# Patient Record
Sex: Male | Born: 1970 | Race: Black or African American | Hispanic: No | Marital: Married | Smoking: Former smoker
Health system: Southern US, Community
[De-identification: ages and names within clinical notes are randomized; demographics above are authoritative.]

## PROBLEM LIST (undated history)

## (undated) DIAGNOSIS — I10 Essential (primary) hypertension: Secondary | ICD-10-CM

## (undated) DIAGNOSIS — R300 Dysuria: Secondary | ICD-10-CM

## (undated) DIAGNOSIS — E7800 Pure hypercholesterolemia, unspecified: Secondary | ICD-10-CM

## (undated) DIAGNOSIS — I4510 Unspecified right bundle-branch block: Secondary | ICD-10-CM

## (undated) DIAGNOSIS — K219 Gastro-esophageal reflux disease without esophagitis: Secondary | ICD-10-CM

## (undated) DIAGNOSIS — Z87442 Personal history of urinary calculi: Secondary | ICD-10-CM

## (undated) DIAGNOSIS — M545 Low back pain, unspecified: Secondary | ICD-10-CM

## (undated) DIAGNOSIS — G478 Other sleep disorders: Secondary | ICD-10-CM

## (undated) DIAGNOSIS — F329 Major depressive disorder, single episode, unspecified: Secondary | ICD-10-CM

## (undated) DIAGNOSIS — Z5189 Encounter for other specified aftercare: Secondary | ICD-10-CM

## (undated) DIAGNOSIS — F3289 Other specified depressive episodes: Secondary | ICD-10-CM

## (undated) DIAGNOSIS — E785 Hyperlipidemia, unspecified: Secondary | ICD-10-CM

## (undated) HISTORY — DX: Gastro-esophageal reflux disease without esophagitis: K21.9

## (undated) HISTORY — DX: Low back pain, unspecified: M54.50

## (undated) HISTORY — DX: Encounter for other specified aftercare: Z51.89

## (undated) HISTORY — DX: Personal history of urinary calculi: Z87.442

## (undated) HISTORY — DX: Hyperlipidemia, unspecified: E78.5

## (undated) HISTORY — DX: Other specified depressive episodes: F32.89

## (undated) HISTORY — PX: HAND SURGERY: SHX662

## (undated) HISTORY — DX: Major depressive disorder, single episode, unspecified: F32.9

## (undated) HISTORY — PX: OTHER SURGICAL HISTORY: SHX169

## (undated) HISTORY — DX: Unspecified right bundle-branch block: I45.10

## (undated) HISTORY — DX: Other sleep disorders: G47.8

## (undated) HISTORY — DX: Low back pain: M54.5

## (undated) HISTORY — DX: Dysuria: R30.0

## (undated) SURGICAL SUPPLY — 1 items: INQWIRE 1.5J .035X260CM (WIRE) ×1 IMPLANT

---

## 2004-02-23 ENCOUNTER — Emergency Department (HOSPITAL_COMMUNITY): Admission: EM | Admit: 2004-02-23 | Discharge: 2004-02-23 | Payer: Self-pay

## 2004-04-26 ENCOUNTER — Ambulatory Visit: Payer: Self-pay | Admitting: Family Medicine

## 2004-04-30 ENCOUNTER — Emergency Department (HOSPITAL_COMMUNITY): Admission: EM | Admit: 2004-04-30 | Discharge: 2004-04-30 | Payer: Self-pay | Admitting: Emergency Medicine

## 2004-05-02 ENCOUNTER — Ambulatory Visit: Payer: Self-pay | Admitting: Family Medicine

## 2004-05-03 ENCOUNTER — Ambulatory Visit: Payer: Self-pay | Admitting: *Deleted

## 2004-05-24 ENCOUNTER — Ambulatory Visit: Payer: Self-pay | Admitting: Family Medicine

## 2004-05-31 ENCOUNTER — Ambulatory Visit: Payer: Self-pay | Admitting: Family Medicine

## 2005-01-24 ENCOUNTER — Ambulatory Visit: Payer: Self-pay | Admitting: Family Medicine

## 2005-01-27 ENCOUNTER — Ambulatory Visit (HOSPITAL_COMMUNITY): Admission: RE | Admit: 2005-01-27 | Discharge: 2005-01-27 | Payer: Self-pay | Admitting: Family Medicine

## 2005-02-08 ENCOUNTER — Ambulatory Visit: Payer: Self-pay | Admitting: Family Medicine

## 2006-07-14 ENCOUNTER — Ambulatory Visit: Payer: Self-pay | Admitting: Cardiology

## 2006-07-14 ENCOUNTER — Emergency Department (HOSPITAL_COMMUNITY): Admission: EM | Admit: 2006-07-14 | Discharge: 2006-07-15 | Payer: Self-pay | Admitting: Emergency Medicine

## 2007-05-16 ENCOUNTER — Ambulatory Visit (HOSPITAL_BASED_OUTPATIENT_CLINIC_OR_DEPARTMENT_OTHER): Admission: RE | Admit: 2007-05-16 | Discharge: 2007-05-16 | Payer: Self-pay | Admitting: Urology

## 2007-05-23 ENCOUNTER — Ambulatory Visit (HOSPITAL_COMMUNITY): Admission: RE | Admit: 2007-05-23 | Discharge: 2007-05-23 | Payer: Self-pay | Admitting: Urology

## 2007-06-27 ENCOUNTER — Ambulatory Visit (HOSPITAL_COMMUNITY): Admission: RE | Admit: 2007-06-27 | Discharge: 2007-06-27 | Payer: Self-pay | Admitting: Urology

## 2009-04-04 ENCOUNTER — Inpatient Hospital Stay (HOSPITAL_COMMUNITY): Admission: EM | Admit: 2009-04-04 | Discharge: 2009-04-06 | Payer: Self-pay | Admitting: Emergency Medicine

## 2009-05-13 DIAGNOSIS — K219 Gastro-esophageal reflux disease without esophagitis: Secondary | ICD-10-CM | POA: Insufficient documentation

## 2009-05-13 DIAGNOSIS — E785 Hyperlipidemia, unspecified: Secondary | ICD-10-CM | POA: Insufficient documentation

## 2009-05-14 ENCOUNTER — Ambulatory Visit: Payer: Self-pay | Admitting: Pulmonary Disease

## 2009-05-14 DIAGNOSIS — G478 Other sleep disorders: Secondary | ICD-10-CM | POA: Insufficient documentation

## 2009-05-14 DIAGNOSIS — I10 Essential (primary) hypertension: Secondary | ICD-10-CM | POA: Insufficient documentation

## 2009-05-27 ENCOUNTER — Ambulatory Visit: Payer: Self-pay | Admitting: Pulmonary Disease

## 2009-11-12 ENCOUNTER — Telehealth: Payer: Self-pay | Admitting: Pulmonary Disease

## 2009-12-10 ENCOUNTER — Telehealth: Payer: Self-pay | Admitting: Physician Assistant

## 2009-12-10 ENCOUNTER — Ambulatory Visit: Payer: Self-pay | Admitting: Physician Assistant

## 2009-12-10 DIAGNOSIS — Z87442 Personal history of urinary calculi: Secondary | ICD-10-CM | POA: Insufficient documentation

## 2009-12-10 DIAGNOSIS — M6530 Trigger finger, unspecified finger: Secondary | ICD-10-CM | POA: Insufficient documentation

## 2009-12-10 DIAGNOSIS — IMO0002 Reserved for concepts with insufficient information to code with codable children: Secondary | ICD-10-CM | POA: Insufficient documentation

## 2009-12-10 DIAGNOSIS — R6889 Other general symptoms and signs: Secondary | ICD-10-CM | POA: Insufficient documentation

## 2009-12-13 LAB — CONVERTED CEMR LAB
ALT: 17 units/L (ref 0–53)
Alkaline Phosphatase: 51 units/L (ref 39–117)
Basophils Relative: 1 % (ref 0–1)
CO2: 22 meq/L (ref 19–32)
Calcium: 9.9 mg/dL (ref 8.4–10.5)
Creatinine, Ser: 0.87 mg/dL (ref 0.40–1.50)
Eosinophils Absolute: 0.2 10*3/uL (ref 0.0–0.7)
Glucose, Bld: 90 mg/dL (ref 70–99)
Hemoglobin: 14.2 g/dL (ref 13.0–17.0)
Lymphocytes Relative: 52 % — ABNORMAL HIGH (ref 12–46)
MCV: 84 fL (ref 78.0–100.0)
Monocytes Absolute: 0.6 10*3/uL (ref 0.1–1.0)
Monocytes Relative: 10 % (ref 3–12)
Neutrophils Relative %: 34 % — ABNORMAL LOW (ref 43–77)
RBC: 5.26 M/uL (ref 4.22–5.81)
RDW: 15.1 % (ref 11.5–15.5)
Sodium: 139 meq/L (ref 135–145)
WBC: 5.3 10*3/uL (ref 4.0–10.5)

## 2009-12-20 ENCOUNTER — Ambulatory Visit: Payer: Self-pay | Admitting: Physician Assistant

## 2009-12-21 LAB — CONVERTED CEMR LAB
Cholesterol, target level: 200 mg/dL
HDL: 42 mg/dL (ref >39)
LDL Cholesterol: 151 mg/dL — ABNORMAL HIGH (ref 0–99)
Total CHOL/HDL Ratio: 5.3
Triglycerides: 152 mg/dL — ABNORMAL HIGH (ref <150)

## 2010-03-09 ENCOUNTER — Telehealth: Payer: Self-pay | Admitting: Physician Assistant

## 2010-03-09 ENCOUNTER — Ambulatory Visit: Payer: Self-pay | Admitting: Physician Assistant

## 2010-03-09 DIAGNOSIS — M545 Low back pain, unspecified: Secondary | ICD-10-CM | POA: Insufficient documentation

## 2010-03-09 DIAGNOSIS — R300 Dysuria: Secondary | ICD-10-CM | POA: Insufficient documentation

## 2010-03-10 ENCOUNTER — Encounter: Payer: Self-pay | Admitting: Physician Assistant

## 2010-03-10 ENCOUNTER — Telehealth: Payer: Self-pay | Admitting: Physician Assistant

## 2010-03-11 DIAGNOSIS — I4510 Unspecified right bundle-branch block: Secondary | ICD-10-CM | POA: Insufficient documentation

## 2010-03-14 ENCOUNTER — Encounter (INDEPENDENT_AMBULATORY_CARE_PROVIDER_SITE_OTHER): Payer: Self-pay | Admitting: *Deleted

## 2010-03-14 ENCOUNTER — Encounter: Payer: Self-pay | Admitting: Physician Assistant

## 2010-07-03 LAB — CONVERTED CEMR LAB
ALT: 14 units/L (ref 0–53)
AST: 13 units/L (ref 0–37)
Alkaline Phosphatase: 54 units/L (ref 39–117)
Bilirubin Urine: NEGATIVE
Blood in Urine, dipstick: NEGATIVE
Glucose, Urine, Semiquant: NEGATIVE
HDL: 33 mg/dL — ABNORMAL LOW (ref >39)
Nitrite: NEGATIVE
Total CHOL/HDL Ratio: 4.1
Total Protein: 7.7 g/dL (ref 6.0–8.3)
VLDL: 20 mg/dL (ref 0–40)
WBC Urine, dipstick: NEGATIVE

## 2010-07-06 NOTE — Letter (Signed)
Summary: *HSN Results Follow up  [facility]  [address] [place], [place] [address]   Phone: [phone]  Fax: [phone]      03/14/2010   Donald [name] [address] [place], [place]  [address]   Dear  Mr. [name],                            ____S.Drinkard,FNP   ____D. Gore,FNP       ____B. McPherson,MD   ____V. Rankins,MD    ____E. Mulberry,MD    ____N. Daphine Deutscher, FNP  ____D. Reche Dixon, MD    ____K. Philipp Deputy, MD    ____Other     This letter is to inform you that your recent test(s):  _______Pap Smear    ___X____Lab Test     _______X-ray    ____X___ is within acceptable limits  _______ requires a medication change  _______ requires a follow-up lab visit  _______ requires a follow-up visit with your provider   Comments:  Please avoid caffeine and drink more water.  Avoid spicy foods also.       _________________________________________________________ If you have any questions, please contact our office                     Sincerely,  [name] [facility]

## 2010-07-06 NOTE — Letter (Signed)
Summary: *HSN Results Follow up  [facility]  [address] [place], [place] [address]   Phone: [phone]  Fax: [phone]      03/14/2010   Kayon Nevils [address] [place], [place]  [address]   Dear  Mr. LORRAINE TERRIQUEZ,                            ____S.Drinkard,FNP   ____D. Gore,FNP       ____B. McPherson,MD   ____V. Rankins,MD    ____E. Mulberry,MD    ____N. Daphine Deutscher, FNP  ____D. Reche Dixon, MD    ____K. Philipp Deputy, MD    ____Other     This letter is to inform you that your recent test(s):  _______Pap Smear    ___X____Lab Test     _______X-ray    ___X____ is within acceptable limits  _______ requires a medication change  _______ requires a follow-up lab visit  _______ requires a follow-up visit with your provider   Comments: Your cholestrol has improved.       _________________________________________________________ If you have any questions, please contact our office                     Sincerely,  [name] [facility]

## 2010-07-06 NOTE — Progress Notes (Signed)
Summary: nos appt  Phone Note Call from Patient   Caller: [email or web address]  Call For: Donald [name] Summary of Call: LMTCB x2 to rsc nos from 6/9. Initial call taken by: Darletta Moll,  November 12, 2009 2:45 PM

## 2010-07-06 NOTE — Progress Notes (Signed)
Summary: PT CHANGE PHARMACY [facility]  Phone Note Call from Patient Call back at [phone]   Reason for Call: Refill Medication, Talk to Nurse Summary of Call: PT NEED TO CHANGE PHARMACY  DONT SEND IT TO [facility]. HE WANTS  [facility] PLEASE SENT THE RX FOR LIPITOR. Initial call taken by: Domenic Polite,  March 10, 2010 9:10 AM  Follow-up for Phone Call        Rx already at [facility]. Pt. advised to call to request refill.  Dutch Quint RN  March 10, 2010 9:24 AM

## 2010-07-06 NOTE — Progress Notes (Signed)
  Phone Note Refill Request Message from:  Patient on March 09, 2010 12:29 PM  Refills Requested: Medication #1:  LISINOPRIL-HYDROCHLOROTHIAZIDE 20-25 MG TABS Take 1 tablet by mouth once a day  Medication #2:  LISINOPRIL 10 MG TABS Take 1 tablet by mouth once a day  Medication #3:  LIPITOR 40 MG TABS Take 1 tab by mouth at bedtime [facility] [address]   Method Requested: Fax to Local Pharmacy Initial call taken by: [name],  March 09, 2010 12:29 PM    Prescriptions: LISINOPRIL 10 MG TABS (LISINOPRIL) Take 1 tablet by mouth once a day  #30 x 11   Entered and Authorized by:   Tereso Newcomer PA-C   Signed by:   Tereso Newcomer PA-C on 03/10/2010   Method used:   Electronically to        [facility] [address] (retail)       [phone]       [place], [place]  [address]       Ph: [phone]       Fax: [phone]   RxID:   [id number] LIPITOR 40 MG TABS (ATORVASTATIN CALCIUM) Take 1 tab by mouth at bedtime  #30 x 11   Entered and Authorized by:   Tereso Newcomer PA-C   Signed by:   Tereso Newcomer PA-C on 03/10/2010   Method used:   Electronically to        [facility] [address] (retail)       [phone]       [place], [place]  [address]       Ph: [phone]       Fax: [phone]   RxID:   [id number] LISINOPRIL-HYDROCHLOROTHIAZIDE 20-25 MG TABS (LISINOPRIL-HYDROCHLOROTHIAZIDE) Take 1 tablet by mouth once a day  #30 x 11   Entered and Authorized by:   Tereso Newcomer PA-C   Signed by:   Tereso Newcomer PA-C on 03/10/2010   Method used:   Electronically to        [facility] [address] (retail)       [phone]       [place], [place]  [address]       Ph: [phone]       Fax: [phone]   RxID:   [id number]

## 2010-07-06 NOTE — Assessment & Plan Note (Signed)
Summary: CPE   Vital Signs:  Patient profile:   40 year old male Height:      74 inches Weight:      235.9 pounds BMI:     30.40 Temp:     98.5 degrees F oral Pulse rate:   76 / minute Pulse rhythm:   regular Resp:     20 per minute BP sitting:   110 / 80  (left arm) Cuff size:   large  Vitals Entered By: [name] (March 09, 2010 8:49 AM) CC: cpe, Hypertension Management Is Patient Diabetic? No Pain Assessment Patient in pain? no       Does patient need assistance? Functional Status Self care Ambulation Normal   Primary Care Provider:  Tereso Newcomer PA-C  CC:  cpe and Hypertension Management.  History of Present Illness: Here for CPE.  PHQ9=4.  Answers yes to "Are you feeling down, depressed or hopeless?"  Never dx with depression or took medicines.  No suicidal thoughts.  Open to seeing counselor.  Cannot find a job. Refuses flu shot. Needs Td shot. Needs lipids checked.    Hand injury:  Never heard about referral back to Dr. Merlyn Lot.  Right hand improving slightly.  Notes continued weakness.  Has wasting at thenar and webspace areas.  Trigger finger on left improving.  Never came in for injection.  Hypertension History:      He denies chest pain and dyspnea with exertion.        Positive major cardiovascular risk factors include hyperlipidemia and hypertension.  Negative major cardiovascular risk factors include male age less than 45 years old and non-tobacco-user status.     Problems Prior to Update: 1)  Dysuria  (ICD-788.1) 2)  Lumbago  (ICD-724.2) 3)  Depression, Mild  (ICD-311) 4)  Preventive Health Care  (ICD-V70.0) 5)  Other Symptoms Involving Head and Neck  (ICD-784.99) 6)  Late Effect of Tendon Injury  (ICD-905.8) 7)  Trigger Finger, Left Thumb  (ICD-727.03) 8)  Nephrolithiasis, Hx of  (ICD-V13.01) 9)  Parasomnia  (ICD-780.59) 10)  Hypertension  (ICD-401.9) 11)  Hyperlipidemia  (ICD-272.4) 12)  G E R D  (ICD-530.81)  Current Medications  (verified): 1)  Aspirin 325 Mg Tabs (Aspirin) .... Once Daily 2)  Lisinopril-Hydrochlorothiazide 20-25 Mg Tabs (Lisinopril-Hydrochlorothiazide) .... Take 1 Tablet By Mouth Once A Day 3)  Klonopin 0.5 Mg Tabs (Clonazepam) .... One Each Night At Bedtime 4)  Lipitor 40 Mg Tabs (Atorvastatin Calcium) .... Take 1 Tab By Mouth At Bedtime 5)  Lisinopril 10 Mg Tabs (Lisinopril) .... Take 1 Tablet By Mouth Once A Day 6)  Zyrtec Hives Relief 10 Mg Tabs (Cetirizine Hcl) .... Take 1 Tablet By Mouth Once A Day As Needed For Scratchy Throat  Allergies (verified): 1)  ! Sulfa  Past History:  Past Medical History: Last updated: 12/10/2009 Current Problems:  HYPERTENSION (ICD-401.9) HYPERLIPIDEMIA (ICD-272.4) G E R D (ICD-530.81) Nephrolithiasis, hx of  Past Surgical History: Last updated: 12/10/2009 recent surgery to repair tendon injuries in right and left hand 03/2009   a.  patient was sleep walking and hand went through glass   b.  had extensive surgery to repair tendons and vessels   c.  presented with hemorrhagic shock h/o ureteral stent due to kidney stones      Family History: Reviewed history from 12/10/2009 and no changes required. non contributory Family History Hypertension- mom No colon or prostate cancer  Social History: Reviewed history from 12/10/2009 and no changes required. Married  no kids Patient states former smoker.  quit in 2003.  1 ppd x3 yrs Alcohol use-no Drug use-no  Review of Systems      See HPI General:  Denies chills and fever. Eyes:  Complains of eye irritation. CV:  Denies chest pain or discomfort and shortness of breath with exertion. Resp:  Denies cough. GI:  Denies bloody stools, constipation, and dark tarry stools. GU:  Complains of dysuria; denies hematuria, nocturia, urinary frequency, and urinary hesitancy. MS:  Complains of low back pain. Psych:  See HPI; Denies suicidal thoughts/plans.  Physical Exam  General:  alert, well-developed,  and well-nourished.   Head:  normocephalic and atraumatic.   Eyes:  pupils equal, pupils round, pupils reactive to light, and no retinal abnormalitiies.   Ears:  R ear normal and L ear normal.   Nose:  no external deformity.   Mouth:  pharynx pink and moist, no erythema, and no exudates.   Neck:  supple, no thyromegaly, no JVD, no carotid bruits, and no cervical lymphadenopathy.   Chest Wall:  no deformities.   Breasts:  no gynecomastia.   Lungs:  normal breath sounds, no crackles, and no wheezes.   Heart:  normal rate, regular rhythm, and no murmur.   Abdomen:  soft, non-tender, normal bowel sounds, and no hepatomegaly.   Rectal:  no external abnormalities, no hemorrhoids, normal sphincter tone, no masses, no tenderness, no fissures, and no fistulae.   Genitalia:  circumcised, no hydrocele, no varicocele, no scrotal masses, no testicular masses or atrophy, no cutaneous lesions, and no urethral discharge.   Prostate:  no gland enlargement, no nodules, no asymmetry, and no induration.   Extremities:  no edema  Neurologic:  alert & oriented X3 and cranial nerves II-XII intact.   BLE strength is normal and equal bilat  Skin:  turgor normal.   Psych:  normally interactive.     Detailed Back/Spine Exam  Lumbosacral Exam:  Inspection-deformity:       some lumbar parapsinal muscle tenderness noted; soft tissue swelling noted Palpation-spinal tenderness:  Normal Sitting Straight Leg Raise:    Right:  negative    Left:  negative   Impression & Recommendations:  Problem # 1:  DEPRESSION, MILD (ICD-311)  PHQ9=4 never on meds feels down sometimes discussed counseling and he is interested  His updated medication list for this problem includes:    Klonopin 0.5 Mg Tabs (Clonazepam) ..... One each night at bedtime  Orders: Psychology Referral (Psychology)  Problem # 2:  PREVENTIVE HEALTH CARE (ICD-V70.0)  Orders: T-Lipid Profile ([address]109-60454) UA Dipstick w/o Micro (manual)  (81002) T-HIV Antibody  (Reflex) (09811-91478)  Problem # 3:  HYPERTENSION (ICD-401.9) controlled renal fxn stable in July  His updated medication list for this problem includes:    Lisinopril-hydrochlorothiazide 20-25 Mg Tabs (Lisinopril-hydrochlorothiazide) .Marland Kitchen... Take 1 tablet by mouth once a day    Lisinopril 10 Mg Tabs (Lisinopril) .Marland Kitchen... Take 1 tablet by mouth once a day  Problem # 4:  HYPERLIPIDEMIA (ICD-272.4)  check lipids LFTs stable in July  His updated medication list for this problem includes:    Lipitor 40 Mg Tabs (Atorvastatin calcium) .Marland Kitchen... Take 1 tab by mouth at bedtime  Orders: T-Lipid Profile (29562-13086)  Problem # 5:  LATE EFFECT OF TENDON INJURY (ICD-905.8) never heard about appt with Dr. Merlyn Lot right hand function is improving will work on referral if no luck consider [facility] or just send to OT  Problem # 6:  TRIGGER FINGER, LEFT THUMB (ICD-727.03) never  made appt with Dr. Delrae Alfred for left thumb feels better he will call if he decides to do injection  Problem # 7:  LUMBAGO (ICD-724.2) ? muscle strain but, does have some improvement with leaning forward and has some pain in ant thigh (? L2-3 range) get xray nsaids and muscle relaxers rest f/u in 4-6 weeks . . . if no improvement or getting worse consider PT +/- MRI  His updated medication list for this problem includes:    Aspirin 325 Mg Tabs (Aspirin) ..... Once daily    Naprosyn 500 Mg Tabs (Naproxen) .Marland Kitchen... Take 1 tablet by mouth two times a day as needed for pain    Robaxin 500 Mg Tabs (Methocarbamol) .Marland Kitchen... 1-2 by mouth every 8 hours as needed for muscle spasm or pain  Problem # 8:  DYSURIA (ICD-788.1) drinks a lot of caffeine and spicy foods may be irritating bladder cut back on these cx urine no penile discharge . . . monogamous relationship return if no improvement  Orders: T-Culture, Urine ([id number])  Complete Medication List: 1)  Aspirin 325 Mg Tabs (Aspirin) .... Once daily 2)   Lisinopril-hydrochlorothiazide 20-25 Mg Tabs (Lisinopril-hydrochlorothiazide) .... Take 1 tablet by mouth once a day 3)  Klonopin 0.5 Mg Tabs (Clonazepam) .... One each night at bedtime 4)  Lipitor 40 Mg Tabs (Atorvastatin calcium) .... Take 1 tab by mouth at bedtime 5)  Lisinopril 10 Mg Tabs (Lisinopril) .... Take 1 tablet by mouth once a day 6)  Zyrtec Hives Relief 10 Mg Tabs (Cetirizine hcl) .... Take 1 tablet by mouth once a day as needed for scratchy throat 7)  Naprosyn 500 Mg Tabs (Naproxen) .... Take 1 tablet by mouth two times a day as needed for pain 8)  Robaxin 500 Mg Tabs (Methocarbamol) .Marland Kitchen.. 1-2 by mouth every 8 hours as needed for muscle spasm or pain  Other Orders: Flu Vaccine 45yrs + (86578) Admin 1st Vaccine (46962) Admin 1st Vaccine [facility]) [id number]) TD Toxoids IM 7 YR + (32440) Admin of Any Addtl Vaccine (10272) Admin of Any Addtl Vaccine (State) (53664Q)  Hypertension Assessment/Plan:      The patient's hypertensive risk group is category B: At least one risk factor (excluding diabetes) with no target organ damage.  His calculated 10 year risk of coronary heart disease is 6 %.  Today's blood pressure is 110/80.  His blood pressure goal is < 140/90.  Patient Instructions: 1)  Schedule appt with Ethelene Browns. 2)  Take Naprosyn with food two times a day every day for 3-5 days.  Then, take two times a day as needed. 3)  Take the Robaxin for 1-2 days every day, then take as needed (as directed).  The robaxin may make you drowsy. 4)  Rest:  No heavy lifting, bending, stooping, running or jumping for 1-2 weeks.  But, stay active.  Go for easy walks and stretch your legs. 5)  You can put heat on your low back for 15 minutes two times a day to three times a day as needed. 6)  Cut back on caffeine and spicy foods as much as possible and drink plenty of water.  Follow up if the burning does not resolve or gets worse. 7)  Schedule follow up in 4 weeks for back pain.  Return sooner if  it is getting worse. Prescriptions: ROBAXIN 500 MG TABS (METHOCARBAMOL) 1-2 by mouth every 8 hours as needed for muscle spasm or pain  #30 x 1   Entered and Authorized by:  Tereso Newcomer PA-C   Signed by:   Tereso Newcomer PA-C on 03/09/2010   Method used:   Print then Give to Patient   RxID:   [id number] NAPROSYN 500 MG TABS (NAPROXEN) Take 1 tablet by mouth two times a day as needed for pain  #30 x 2   Entered and Authorized by:   Tereso Newcomer PA-C   Signed by:   Tereso Newcomer PA-C on 03/09/2010   Method used:   Print then Give to Patient   RxID:   [id number]   Laboratory Results   Urine Tests  Date/Time Received: March 09, 2010 9:08 AM   Routine Urinalysis   Glucose: negative   (Normal Range: Negative) Bilirubin: negative   (Normal Range: Negative) Ketone: negative   (Normal Range: Negative) Spec. Gravity: >=1.030   (Normal Range: 1.003-1.035) Blood: negative   (Normal Range: Negative) pH: 6.0   (Normal Range: 5.0-8.0) Protein: negative   (Normal Range: Negative) Urobilinogen: 0.2   (Normal Range: 0-1) Nitrite: negative   (Normal Range: Negative) Leukocyte Esterace: negative   (Normal Range: Negative)    Date/Time Received: March 09, 2010 12:29 PM   Other Tests  Rapid HIV: negative     Tetanus/Td Vaccine    Vaccine Type: Td    Site: left deltoid    Mfr: Sanofi Pasteur    Dose: 0.5 ml    Route: IM    Given by: [name]    Exp. Date: 06/15/2012    Lot #: [id number]    VIS given: 04/22/08 version given March 09, 2010.  Influenza Vaccine    Vaccine Type: Fluvax 3+    Site: right deltoid    Mfr: GlaxoSmithKline    Dose: 0.5 ml    Route: IM    Given by: [name]    Exp. Date: /2012    Lot #: [id number]    VIS given: 12/28/09 version given March 09, 2010.  Flu Vaccine Consent Questions    Do you have a history of severe allergic reactions to this vaccine? no    Any prior history of allergic reactions to egg and/or gelatin? no     Do you have a sensitivity to the preservative Thimersol? no    Do you have a past history of Guillan-Barre Syndrome? no    Do you currently have an acute febrile illness? no    Have you ever had a severe reaction to latex? no    Vaccine information given and explained to patient? yes  Appended Document: CPE    Clinical Lists Changes  Problems: Added new problem of RIGHT BUNDLE BRANCH BLOCK (ICD-426.4) Observations: Added new observation of EKGIMPRESS: consistent with previous tracing in 10.2011 (03/09/2010 15:38) Added new observation of EKG INTERP: Normal sinus rhythm with rate of:  64 Right bundle branch block.   (03/09/2010 15:38)       EKG  Procedure date:  03/09/2010  Findings:      Normal sinus rhythm with rate of:  64 Right bundle branch block.    Comments:      consistent with previous tracing in 10.2011

## 2010-07-06 NOTE — Progress Notes (Signed)
Summary: Office Visit//DEPRESSION SCREENING  Office Visit//DEPRESSION SCREENING   Imported By: Arta Bruce 03/14/2010 11:12:33  _____________________________________________________________________  External Attachment:    Type:   Image     Comment:   External Document

## 2010-07-06 NOTE — Assessment & Plan Note (Signed)
Summary: Barbaraann Barthel PT/HTN///KT   Vital Signs:  Patient profile:   40 year old male Weight:      255 pounds Temp:     97.8 degrees F oral Pulse rate:   74 / minute Pulse rhythm:   regular Resp:     20 per minute BP sitting:   126 / 81  (left arm)  Vitals Entered By: [name] (December 10, 2009 12:13 PM) CC: follow-up visit, hypetenison and high cholestorol, pt.wnats a referral for ortho. (right hand). Is Patient Diabetic? No Pain Assessment Patient in pain? no       Does patient need assistance? Functional Status Self care Ambulation Normal   Primary Care Provider:  Tereso Newcomer PA-C  CC:  follow-up visit, hypetenison and high cholestorol, and pt.wnats a referral for ortho. (right hand)..  History of Present Illness: Re-estab.  Patient thinks he used to see Dr. Barbaraann Barthel.  Going to [facility] for last few years.   Saw Dr. Chilton Si.  Has HTN and high chol.  Was sleep walking in Oct and had significant lac. to both hands.  Presented with hemorrhagic shock.  Had extensive surgery on both hands.  Was seeing Dr. Merlyn Lot who was closely following his right hand.  He had a trigger finger injection done on the thumb recently.  He does have a trigger finger on the left thumb at the DIPJ.  He notes pain with this.  He is slowly gaining function with his right hand.  He last saw Dr. Merlyn Lot in May.  Sounds like he may have been doing periodic EMGs to assess his rehab.  He was told he may need further surgery on his right hand.  Habits & Providers  Exercise-Depression-Behavior     Drug Use: no  Current Medications (verified): 1)  Aspirin 325 Mg Tabs (Aspirin) .... Once Daily 2)  Lisinopril-Hydrochlorothiazide 10-12.5 Mg Tabs (Lisinopril-Hydrochlorothiazide) .... Once Daily 3)  Klonopin 0.5 Mg Tabs (Clonazepam) .... One Each Night At Bedtime  Allergies (verified): 1)  ! Sulfa  Past History:  Past Medical History: Current Problems:  HYPERTENSION (ICD-401.9) HYPERLIPIDEMIA  (ICD-272.4) G E R D (ICD-530.81) Nephrolithiasis, hx of  Past Surgical History: recent surgery to repair tendon injuries in right and left hand 03/2009   a.  patient was sleep walking and hand went through glass   b.  had extensive surgery to repair tendons and vessels   c.  presented with hemorrhagic shock h/o ureteral stent due to kidney stones      Family History: non contributory Family History Hypertension- mom No colon or prostate cancer  Social History: Married no kids Patient states former smoker.  quit in 2003.  1 ppd x3 yrs Alcohol use-no Drug use-no Drug Use:  no  Review of Systems      See HPI General:  Denies chills and fever. ENT:  constantly clearing throat; no sore throat; no congestion or sneezing. CV:  Denies chest pain or discomfort and shortness of breath with exertion. Resp:  Denies cough.  Physical Exam  General:  alert, well-developed, and well-nourished.   Head:  normocephalic and atraumatic.   Mouth:  pharynx pink and moist.   tonsils 2+ bilat no exudate Neck:  supple.   Lungs:  normal breath sounds.   Heart:  normal rate and regular rhythm.   Abdomen:  soft, non-tender, and no hepatomegaly.   Msk:  right and left arm with extensive scarring  left thumb with trigger lock at DIPJ; pully palpated at  MCPJ Extremities:  no edema  Neurologic:  alert & oriented X3 and cranial nerves II-XII intact.   Psych:  normally interactive.     Impression & Recommendations:  Problem # 1:  HYPERTENSION (ICD-401.9)  controlled  His updated medication list for this problem includes:    Lisinopril-hydrochlorothiazide 20-25 Mg Tabs (Lisinopril-hydrochlorothiazide) .Marland Kitchen... Take 1 tablet by mouth once a day    Lisinopril 10 Mg Tabs (Lisinopril) .Marland Kitchen... Take 1 tablet by mouth once a day  Orders: T-Comprehensive Metabolic Panel [phone]) T-CBC w/Diff ([id number]) T-TSH ([id number])  Problem # 2:  HYPERLIPIDEMIA (ICD-272.4)  check labs  His  updated medication list for this problem includes:    Pravastatin Sodium 40 Mg Tabs (Pravastatin sodium) .Marland Kitchen... 1 tablet by mouth once a day  Orders: T-Comprehensive Metabolic Panel [phone]) T-Lipid Profile ([id number])  Problem # 3:  LATE EFFECT OF TENDON INJURY (ICD-905.8) will try to get back with Dr. Merlyn Lot may need to send to [facility]  Problem # 4:  OTHER SYMPTOMS INVOLVING HEAD AND NECK (ICD-784.99)  ? allergies trial of zyrtec to see if this helps  Orders: Orthopedic Surgeon Referral (Ortho Surgeon)  Problem # 5:  TRIGGER FINGER, LEFT THUMB (ICD-727.03) Dr. Delrae Alfred saw patient will set up time for her to inject his left thumb  Complete Medication List: 1)  Aspirin 325 Mg Tabs (Aspirin) .... Once daily 2)  Lisinopril-hydrochlorothiazide 20-25 Mg Tabs (Lisinopril-hydrochlorothiazide) .... Take 1 tablet by mouth once a day 3)  Klonopin 0.5 Mg Tabs (Clonazepam) .... One each night at bedtime 4)  Pravastatin Sodium 40 Mg Tabs (Pravastatin sodium) .Marland Kitchen.. 1 tablet by mouth once a day 5)  Lisinopril 10 Mg Tabs (Lisinopril) .... Take 1 tablet by mouth once a day 6)  Zyrtec Hives Relief 10 Mg Tabs (Cetirizine hcl) .... Take 1 tablet by mouth once a day as needed for scratchy throat  Patient Instructions: 1)  Schedulel 45 minute appointment with Dr. Delrae Alfred for trigger finger injection. 2)  Please schedule a follow-up appointment in 2-3 months with Kino Dunsworth for CPE. 3)  Someone will call you about seeing Dr. Merlyn Lot back.  Prescriptions: ZYRTEC HIVES RELIEF 10 MG TABS (CETIRIZINE HCL) Take 1 tablet by mouth once a day as needed for scratchy throat  #30 x 2   Entered and Authorized by:   Tereso Newcomer PA-C   Signed by:   Tereso Newcomer PA-C on 12/10/2009   Method used:   Print then Give to Patient   RxID:   [id number] PRAVASTATIN SODIUM 40 MG TABS (PRAVASTATIN SODIUM) 1 tablet by mouth once a day  #30 x 5   Entered and Authorized by:   Tereso Newcomer PA-C   Signed by:   Tereso Newcomer  PA-C on 12/10/2009   Method used:   Print then Give to Patient   RxID:   [id number] LISINOPRIL 10 MG TABS (LISINOPRIL) Take 1 tablet by mouth once a day  #30 x 5   Entered and Authorized by:   Tereso Newcomer PA-C   Signed by:   Tereso Newcomer PA-C on 12/10/2009   Method used:   Print then Give to Patient   RxID:   [id number] LISINOPRIL-HYDROCHLOROTHIAZIDE 20-25 MG TABS (LISINOPRIL-HYDROCHLOROTHIAZIDE) Take 1 tablet by mouth once a day  #30 x 5   Entered and Authorized by:   Tereso Newcomer PA-C   Signed by:   Tereso Newcomer PA-C on 12/10/2009   Method used:   Print then Give to Patient   RxID:   [id number]

## 2010-07-06 NOTE — Progress Notes (Signed)
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  Patient had surgery with Dr. Merlyn Lot in Oct 2010.  He has been seeing him on a regular basis.  He lost his insurance recently.  He may need future surgery.  Please see if we can get him back to Dr.Kuzma.  If patient cannot afford this, please try to get into [facility]. Initial call taken by: Tereso Newcomer PA-C,  December 10, 2009 2:35 PM  Follow-up for Phone Call        once surgery has gone into play [facility] has not been taking our practice.They have been sending them back to original provider. Follow-up by: Candi Leash,  December 13, 2009 3:34 PM  Additional Follow-up for Phone Call Additional follow up Details #1::        Ok.  Let's get him back to Dr. Merlyn Lot. Additional Follow-up by: Tereso Newcomer PA-C,  December 13, 2009 5:47 PM

## 2010-09-07 LAB — CBC
HCT: 24 % — ABNORMAL LOW (ref 39.0–52.0)
HCT: 24 % — ABNORMAL LOW (ref 39.0–52.0)
MCHC: 35 g/dL (ref 30.0–36.0)
MCV: 86.1 fL (ref 78.0–100.0)
MCV: 87.2 fL (ref 78.0–100.0)
Platelets: 189 10*3/uL (ref 150–400)
RBC: 2.78 MIL/uL — ABNORMAL LOW (ref 4.22–5.81)
RDW: 14.3 % (ref 11.5–15.5)
WBC: 8.8 10*3/uL (ref 4.0–10.5)

## 2010-09-07 LAB — DIFFERENTIAL
Eosinophils Relative: 1 % (ref 0–5)
Lymphs Abs: 2.3 10*3/uL (ref 0.7–4.0)
Monocytes Absolute: 0.9 10*3/uL (ref 0.1–1.0)
Neutrophils Relative %: 59 % (ref 43–77)

## 2010-09-07 LAB — BASIC METABOLIC PANEL
CO2: 30 mEq/L (ref 19–32)
Calcium: 7.3 mg/dL — ABNORMAL LOW (ref 8.4–10.5)
GFR calc Af Amer: >60 mL/min (ref >60)
Glucose, Bld: 117 mg/dL — ABNORMAL HIGH (ref 70–99)
Sodium: 136 mEq/L (ref 135–145)

## 2010-09-08 LAB — POCT I-STAT 4, (NA,K, GLUC, HGB,HCT)
Glucose, Bld: 140 mg/dL — ABNORMAL HIGH (ref 70–99)
Potassium: 4.2 mEq/L (ref 3.5–5.1)

## 2010-09-08 LAB — POCT I-STAT, CHEM 8
BUN: 17 mg/dL (ref 6–23)
Calcium, Ion: 1.12 mmol/L (ref 1.12–1.32)
Chloride: 103 mEq/L (ref 96–112)
Creatinine, Ser: 0.9 mg/dL (ref 0.4–1.5)
Glucose, Bld: 187 mg/dL — ABNORMAL HIGH (ref 70–99)
TCO2: 22 mmol/L (ref 0–100)

## 2010-09-08 LAB — BASIC METABOLIC PANEL
Calcium: 8 mg/dL — ABNORMAL LOW (ref 8.4–10.5)
Chloride: 107 mEq/L (ref 96–112)
Glucose, Bld: 202 mg/dL — ABNORMAL HIGH (ref 70–99)
Sodium: 139 mEq/L (ref 135–145)

## 2010-09-08 LAB — CBC
HCT: 26.5 % — ABNORMAL LOW (ref 39.0–52.0)
HCT: 26.9 % — ABNORMAL LOW (ref 39.0–52.0)
HCT: 36.2 % — ABNORMAL LOW (ref 39.0–52.0)
HCT: 36.6 % — ABNORMAL LOW (ref 39.0–52.0)
Hemoglobin: 12.6 g/dL — ABNORMAL LOW (ref 13.0–17.0)
Hemoglobin: 12.6 g/dL — ABNORMAL LOW (ref 13.0–17.0)
MCHC: 34.5 g/dL (ref 30.0–36.0)
MCV: 86.4 fL (ref 78.0–100.0)
MCV: 87.1 fL (ref 78.0–100.0)
MCV: 87.4 fL (ref 78.0–100.0)
Platelets: 194 10*3/uL (ref 150–400)
Platelets: 209 10*3/uL (ref 150–400)
RBC: 3.04 MIL/uL — ABNORMAL LOW (ref 4.22–5.81)
RDW: 14.4 % (ref 11.5–15.5)
WBC: 11.2 10*3/uL — ABNORMAL HIGH (ref 4.0–10.5)
WBC: 13.5 10*3/uL — ABNORMAL HIGH (ref 4.0–10.5)
WBC: 8.3 10*3/uL (ref 4.0–10.5)

## 2010-09-08 LAB — URINALYSIS, ROUTINE W REFLEX MICROSCOPIC
Nitrite: NEGATIVE
Specific Gravity, Urine: 1.022 (ref 1.005–1.030)
Urobilinogen, UA: 0.2 mg/dL (ref 0.0–1.0)
pH: 6.5 (ref 5.0–8.0)

## 2010-09-08 LAB — TYPE AND SCREEN
ABO/RH(D): A POS
Antibody Screen: NEGATIVE

## 2010-09-08 LAB — HEPATIC FUNCTION PANEL
ALT: 16 U/L
Albumin: 3.3 g/dL
Total Bilirubin: 0.5 mg/dL

## 2010-09-08 LAB — DIFFERENTIAL
Basophils Absolute: 0.1 10*3/uL (ref 0.0–0.1)
Basophils Relative: 1 % (ref 0–1)
Eosinophils Relative: 3 % (ref 0–5)
Lymphs Abs: 5.1 10*3/uL — ABNORMAL HIGH (ref 0.7–4.0)
Monocytes Absolute: 0.5 10*3/uL (ref 0.1–1.0)
Monocytes Relative: 7 % (ref 3–12)
Neutro Abs: 2.4 10*3/uL (ref 1.7–7.7)

## 2010-09-08 LAB — PROTIME-INR
INR: 1.15 (ref 0.00–1.49)
Prothrombin Time: 14.6 seconds (ref 11.6–15.2)

## 2010-09-08 LAB — APTT: aPTT: 23 seconds — ABNORMAL LOW (ref 24–37)

## 2010-09-08 LAB — MRSA CULTURE

## 2010-10-18 NOTE — Op Note (Signed)
NAMEZIQUAN, FIDEL           ACCOUNT NO.:  [id number]   MEDICAL RECORD NO.:  [id number]          PATIENT TYPE:  AMB   LOCATION:  [facility]                         FACILITY:  [facility]   PHYSICIAN:  Bertram Millard. Dahlstedt, M.D.DATE OF BIRTH:  1970-09-07   DATE OF PROCEDURE:  05/16/2007  DATE OF DISCHARGE:                               OPERATIVE REPORT   PREOPERATIVE DIAGNOSIS:  Left renal calculi, large, with hematuria.   POSTOPERATIVE DIAGNOSIS:  Left renal calculi, large, with hematuria.   PRINCIPAL PROCEDURES:  1. Cystoscopy.  2. Left retrograde ureteropyelogram.  3. Left double-J stent placement.   SURGEON:  Bertram Millard. Dahlstedt, M.D.   ANESTHESIA:  General LMA.   COMPLICATIONS:  None.   BRIEF HISTORY:  A 40 year old male who presented earlier this week with  intermittent left flank pain and gross hematuria.  He had had a prior  history of kidney stones.  Evaluation included a KUB, which showed large  left to right renal calculi.  One of these was more medially placed than  the other.  I felt that more than likely this was causing his hematuria  and his left flank pain.  He has a prior history of kidney stones and  lithotripsy.   I recommended treatment of these stones as they are quite large and  symptomatic.  Treatment options include percutaneous nephrolithotomy and  lithotripsy.  The stones are large and if lithotripsy were to be  performed, I recommend a stent placement.  He has chosen to have  lithotripsy over percutaneous nephrolithotomy.  I let him know that more  than likely stones like this would need more than one treatment.  He is  aware of this as well as risks and complications of the procedure.  He  desires to proceed.   DESCRIPTION OF PROCEDURE:  The patient was administered preoperative IV  antibiotics and taken to the operating room after his surgical side was  marked.  He was placed in the dorsal lithotomy position after general  anesthetic was  administered using the LMA.  Genitalia and perineum were  prepped and draped.  A 22-French panendoscope was passed through his  urethra, which was normal.  Prostate nonobstructive.  Bladder entered  and inspected circumferentially.  No tumors, trabeculations or foreign  bodies.  Ureteral orifices normal configuration and location.  Left  ureter was cannulated with an open-end catheter.  Retrograde performed.  It showed normal ureter.  There was no significant hydronephrosis.  Filling defects consistent with stones were seen in the lower pole area  in the pelvis of his kidney.  Following adequate retrograde, a guidewire  was placed through the end-hole catheter.  I then removed the end-hole  catheter and then a 26 cm x 6-French contour stent was placed.  Good  curls were seen  proximally and distally when this guidewire was removed.  The bladder  was drained and the procedure terminated after the scope was removed.   The patient tolerated the procedure well.  He was awakened, taken to  PACU in stable condition.      Bertram Millard. Dahlstedt, M.D.  Electronically Signed  SMD/MEDQ  D:  05/16/2007  T:  05/17/2007  Job:  [id number]

## 2010-10-21 NOTE — Discharge Summary (Signed)
[name], Donald [name]           ACCOUNT NO.:  [id number]   MEDICAL RECORD NO.:  [id number]          PATIENT TYPE:  EMS   LOCATION:  [facility]                         FACILITY:  [facility]   PHYSICIAN:  Learta Codding, MD,FACC DATE OF BIRTH:  Jul 23, 1970   DATE OF ADMISSION:  07/14/2006  DATE OF DISCHARGE:  07/15/2006                               DISCHARGE SUMMARY   PROCEDURES:  CT angiogram of the chest and abdomen.   PRIMARY DIAGNOSIS:  Chest pain.   SECONDARY DIAGNOSES:  1. Hypertension.  2. Hyperlipidemia with a total cholesterol of 222, triglycerides 112,      HDL 31, LDL 169.   TIME AT DISCHARGE:  34 minutes.   HOSPITAL COURSE:  [name] is a 40 year old male with no previous  history of coronary artery disease.  He came to the emergency room for  chest pain that was both at rest and with exertion.  He was admitted for  further evaluation and treatment.   Cardiac enzymes were negative for MI.  A D-dimer was within normal  limits.  TSH was within normal limits as well.  There was concern for  structural cause of chest pain, so a CT of the chest and abdomen was  performed.  There was a 5 mm nonspecific right lower lobe pulmonary  nodule, for which a followup CT was recommended in 9-12 months, but no  PE or dissection.  His abdominal CT had no significant abnormalities.  And chest x-ray had no acute abnormalities.   [name] chest pain resolved.  His cardiac enzymes were negative  for MI.  He was ambulating without chest pain or shortness of breath and  considered stable for discharge with outpatient followup to be arranged.   DISCHARGE INSTRUCTIONS:  1. His activity level is to be increased gradually.  2. He is to follow up at our office for a stress test, and an      outpatient visit has been arranged.  3. He was given a prescription for nitroglycerin with instructions on      its use.  4. He had a beta blocker added to his medication regimen for improved      blood  pressure control.  5. He is encouraged to obtain a primary care physician and was given      information on [facility].   DISCHARGE MEDICATIONS:  1. Norvasc 5 mg daily  2. Metoprolol 25 mg b.i.d.  3. Sublingual nitroglycerin p.r.n.  4. Aspirin 81 mg a day.      Theodore Demark, PA-C      Learta Codding, MD,FACC  Electronically Signed    RB/MEDQ  D:  07/15/2006  T:  07/15/2006  Job:  [id number]

## 2010-10-21 NOTE — H&P (Signed)
NAMESHLOIMY, MICHALSKI           ACCOUNT NO.:  [id number]   MEDICAL RECORD NO.:  [id number]          PATIENT TYPE:  EMS   LOCATION:  [facility]                         FACILITY:  [facility]   PHYSICIAN:  Lorain Childes, MD DATE OF BIRTH:  [birth year]   DATE OF ADMISSION:  07/14/2006  DATE OF DISCHARGE:  07/15/2006                              HISTORY & PHYSICAL   CHIEF COMPLAINT:  Chest pain.   HISTORY OF PRESENT ILLNESS:  New to cardiology.  Patient is a 40-year-  old gentleman with chest pain for the past 2 days.  He reports his pain  began while walking to his  neighbor's house around 5 p.m. yesterday.  He was walking approximately 2-3 miles.  It resolved with rest.  It is  described as a grabbing sensation in his chest radiating to his back.  He had a little pain last night; however, dealt with it and then today  he noted he had 3 episodes of chest pain, each lasting around 30  minutes, typically exertional in nature.  Again, this grabbing sensation  in his chest.  He rates it about a 4/10.  He denies any shortness of  breath, nausea or vomiting.  No diaphoresis.  No numbness.  He came to  the emergency room for further evaluation as he has had this chest pain  now without doing much activity around his house.  He denies any recent  trouble.  No lower extremity edema or pain.  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 However, this activity has  not changed and his responsibilities have remained the same.  He has not  done any heavy lifting.   PAST MEDICAL HISTORY:  1. Hypertension.  He was diagnosed approximately 5 months ago.  He has      been maintained on Norvasc for this.  2. History of nephrolithiasis.   MEDICATIONS:  Norvasc 5 mg p.o. daily.   ALLERGIES:  HE HAS NO KNOWN DRUG ALLERGIES.   SOCIAL HISTORY:  He lives in [place].  He drives a forklift at work.  He has a 5-pack year history of tobacco; he quit 2 years ago.  He denies  any  alcohol or drugs.  No other medication use.   FAMILY HISTORY:  Mother has hypertension, father has hypertension; they  are both around 42 years of age.  His siblings are okay without any  health issues.   REVIEW OF SYSTEMS:  He denies any fever, chills.  No cough with sputum.  No headache or visual changes.  No skin rashes or lesions.  He reports  chest pain as described in the HPI.  He denies any shortness of breath,  dyspnea on exertion or orthopnea.  No PND.  No lower extremity edema.  No palpitations.  No syncopal events.  No cough or wheezing.  He denies  any urinary symptoms.  No recent hematuria.  He denies any focal  weakness or numbness.  Denies any nausea, vomiting or diarrhea.  No  bright red blood per rectum.  No melena.  No hematemesis.  No change in  his bowel habits.  He denies any polyuria or polydipsia.  All other  systems are negative.   PHYSICAL EXAM:  VITAL SIGNS:  Temperature 97.7, pulse 76, respirations  18, blood pressure 147/92.  Sating 99% on room air.  GENERAL:  He is pleasant, comfortable, lying on stretcher in no acute  distress.  HEENT:  Normocephalic, atraumatic.  NECK:  JVP is approximately 6-cm.  He has 2+ carotid pulses.  There are  no bruits.  CARDIOVASCULAR EXAM:  S1 as well as S2, regular rate and rhythm.  He  does have a S4.  There is no S3.  He has no murmurs appreciated.  His  pulses are 2+ throughout.  There are no bruits.  LUNGS:  His lungs are clear throughout.  No wheezing, rales or rhonchi.  ABDOMEN:  Is obese, soft, positive bowel sounds, nontender.  No  organomegaly.  EXTREMITIES:  He has no edema.  2+ distal pulses.  NEUROLOGIC:  He is alert and oriented.  Strength is 5/5 in upper and  lower extremities bilaterally.   Chest x-ray shows no acute airspace disease, normal cardiac silhouette.  EKG shows rate of 78, sinus rhythm, normal axis, right bundle branch  block.  PR interval is 179 milliseconds.  QRS is 167 milliseconds.  QTC  is  481.  He has no acute ischemic changes.   LABORATORY DATA:  His white count is 6.2, hematocrit is 48, platelet  count is 343, potassium is 4.1, creatinine 0.8, glucose is 92.  CK MB is  1.5.  Troponin is less than 0.05.  Myoglobin is 72.8.   ASSESSMENT AND PLAN:  Patient is a 40 year old gentleman with exertional  chest pain.   Chest pain.  The exertional nature of his pain makes this concerning for  coronary artery disease, but description is atypical in nature.  Since  it does radiate to his back, I am also concerned about dissection with  his history of hypertension.  Will order a stat CT to rule this out.  If  this is negative then we will admit him on telemetry, cycle of cardiac  enzymes and follow his EKG.  Will treat him with aspirin, Lovenox if his  CT is negative, metoprolol and nitroglycerin.  Will check his lipids and  consider a stress test pending his EKG and symptoms.      Lorain Childes, MD  Electronically Signed     CGF/MEDQ  D:  07/14/2006  T:  07/16/2006  Job:  [id number]

## 2011-03-13 LAB — POCT I-STAT 4, (NA,K, GLUC, HGB,HCT)
Glucose, Bld: 99
Operator id: 268271

## 2011-10-03 ENCOUNTER — Encounter: Payer: Self-pay | Admitting: Internal Medicine

## 2011-10-03 ENCOUNTER — Ambulatory Visit: Payer: Self-pay | Admitting: Internal Medicine

## 2011-10-03 VITALS — BP 137/84 | HR 93 | Temp 102.8°F | Resp 16 | Ht 73.0 in

## 2011-10-03 DIAGNOSIS — J029 Acute pharyngitis, unspecified: Secondary | ICD-10-CM

## 2011-10-03 DIAGNOSIS — R509 Fever, unspecified: Secondary | ICD-10-CM

## 2011-10-03 DIAGNOSIS — J039 Acute tonsillitis, unspecified: Secondary | ICD-10-CM

## 2011-10-03 LAB — POCT RAPID STREP A (OFFICE): Rapid Strep A Screen: NEGATIVE

## 2011-10-03 MED ORDER — AMOXICILLIN 500 MG PO CAPS: 500.0000 mg | ORAL_CAPSULE | Freq: Three times a day (TID) | ORAL | Status: DC

## 2011-10-03 NOTE — Patient Instructions (Signed)
Take antibiotics as directed. You may use ibuprofen otc as directed for fever and aches.  increase fluids. If you worsen return to [facility].

## 2011-10-03 NOTE — Progress Notes (Signed)
  Subjective:    Patient ID: Donald [name], male    DOB: [birth year], 41 y.o.   MRN: [id number]  HPI Sore throat Fever 3 days Moderate in severity 5/10 hard to swallow able to swallow his secretions tender swollen lymph nodes No radiation of pain  Review of Systems  Constitutional: Positive for fever, chills and fatigue.  HENT: Positive for sore throat.   Eyes: Negative.   Respiratory: Negative.   Cardiovascular: Negative.   Gastrointestinal: Negative.   Genitourinary: Negative.   Musculoskeletal: Negative.   Skin: Negative.   Neurological: Negative.   Hematological: Negative.   Psychiatric/Behavioral: Negative.   All other systems reviewed and are negative.       Objective:   Physical Exam  Nursing note and vitals reviewed. Constitutional: He is oriented to person, place, and time. He appears well-developed and well-nourished.  HENT:  Head: Normocephalic and atraumatic.  Right Ear: External ear normal.  Left Ear: External ear normal.  Nose: Nose normal.       Tonsillar hypertrophy with bilateral exudates  Eyes: Conjunctivae and EOM are normal. Pupils are equal, round, and reactive to light.  Neck: Normal range of motion. Neck supple.  Cardiovascular: Normal rate, regular rhythm, normal heart sounds and intact distal pulses.   Pulmonary/Chest: Effort normal and breath sounds normal.  Abdominal: Soft. Bowel sounds are normal.  Musculoskeletal: Normal range of motion.  Lymphadenopathy:    He has cervical adenopathy.  Neurological: He is alert and oriented to person, place, and time. He has normal reflexes.  Skin: Skin is warm and dry.  Psychiatric: He has a normal mood and affect. His behavior is normal. Judgment and thought content normal.     Results for orders placed in visit on 10/03/11  POCT RAPID STREP A (OFFICE)      Component Value Range   Rapid Strep A Screen Negative  Negative        Assessment & Plan:  Fever pharyngitis and tonsilitus with  exudate. Will check strep status and rx with antibiotics.strep negitive

## 2011-10-04 ENCOUNTER — Telehealth: Payer: Self-pay

## 2011-10-04 NOTE — Telephone Encounter (Signed)
Pt came into 102 and spoke w/me. He stated he was in yesterday and saw Dr Mindi Junker and was given Amoxicillin. He didn't realize until after he p/up that she had Rxd this. Pt stated he had just taken Amox all of last week Rxd by another provider from another office and it did not help him at all. Can we please Rx a different Abx that might help him?

## 2011-10-04 NOTE — Telephone Encounter (Signed)
That would indicate that this is a virus, not requiring an antibiotic. If he is insistent on having an antibiotic we can call in a Zpack. Although from the sounds of his phone note this may not be necessary.

## 2011-10-05 NOTE — Telephone Encounter (Signed)
Spoke to patient. He will wait on getting another antibiotic. He will see if he improves.

## 2011-12-26 ENCOUNTER — Ambulatory Visit: Payer: Self-pay

## 2012-11-18 ENCOUNTER — Emergency Department (INDEPENDENT_AMBULATORY_CARE_PROVIDER_SITE_OTHER)
Admission: EM | Admit: 2012-11-18 | Discharge: 2012-11-18 | Disposition: A | Discharge status: Home / Self Care | Payer: BC Managed Care – PPO | Source: Home / Self Care | Attending: Emergency Medicine | Admitting: Emergency Medicine

## 2012-11-18 ENCOUNTER — Encounter (HOSPITAL_COMMUNITY): Payer: Self-pay | Admitting: Emergency Medicine

## 2012-11-18 DIAGNOSIS — I10 Essential (primary) hypertension: Secondary | ICD-10-CM

## 2012-11-18 HISTORY — DX: Essential (primary) hypertension: I10

## 2012-11-18 LAB — POCT I-STAT, CHEM 8
BUN: 21 mg/dL (ref 6–23)
Chloride: 102 mEq/L (ref 96–112)
Creatinine, Ser: 1.1 mg/dL (ref 0.50–1.35)
Sodium: 141 mEq/L (ref 135–145)

## 2012-11-18 MED ORDER — LISINOPRIL-HYDROCHLOROTHIAZIDE 20-25 MG PO TABS: 1.0000 | ORAL_TABLET | Freq: Every day | ORAL | Status: DC

## 2012-11-18 NOTE — ED Notes (Signed)
Patient took his last blood pressure pill today. Patient needing medication refill

## 2012-11-18 NOTE — ED Provider Notes (Signed)
Chief Complaint:   Chief Complaint  Patient presents with  . Medication Refill    History of Present Illness:   Donald [name] is a 42 year old male who presents today for refills on blood pressure medication. He has had high blood pressure for years. He is on lisinopril/HCTZ 20/25 one per day. He denies any medication side effects. He's had no headaches, dizziness, blurred vision, shortness of breath, chest pain, tightness, pressure, strokelike symptoms, or ankle edema. He denies any history of diabetes or heart disease or kidney disease. He does have high cholesterol and was taking pravastatin for that.  Review of Systems:  Other than noted above, the patient denies any of the following symptoms: Systemic:  No fever, chills, fatigue, weight loss or gain. Respiratory:  No coughing, wheezing, or shortness of breath. Cardiac:  No chest pain, tightness, pressure, palpitations, syncope, or edema. Neuro:  No headache, dizziness, blurred vision, weakness, paresthesias, or strokelike symptoms.  PMFSH:  Past medical history, family history, social history, meds, and allergies were reviewed.  Physical Exam:   Vital signs:  BP 135/85  Pulse 72  Temp(Src) 97.9 F (36.6 C) (Oral)  Resp 20  SpO2 100% General:  Alert, oriented, in no distress. Lungs:  Breath sounds clear and equal bilaterally.  No wheezes, rales, or rhonchi. Heart:  Regular rhythm, no gallops, murmers, clicks or rubs.  Abdomen:  Soft and flat.  Nontender, no organomegaly or mass.  No pulsatile midline abdominal mass or bruit. Ext:  No edema, pulses full.  Labs:   Results for orders placed during the hospital encounter of 11/18/12  POCT I-STAT, CHEM 8      Result Value Range   Sodium 141  135 - 145 mEq/L   Potassium 3.7  3.5 - 5.1 mEq/L   Chloride 102  96 - 112 mEq/L   BUN 21  6 - 23 mg/dL   Creatinine, Ser 4.78  0.50 - 1.35 mg/dL   Glucose, Bld 89  70 - 99 mg/dL   Calcium, Ion 2.95  6.21 - 1.23 mmol/L   TCO2 30  0 - 100  mmol/L   Hemoglobin 15.3  13.0 - 17.0 g/dL   HCT 30.8  65.7 - 84.6 %    Assessment:  The encounter diagnosis was HYPERTENSION.  Plan:   1.  The following meds were prescribed:   Discharge Medication List as of 11/18/2012  6:10 PM    START taking these medications   Details  !! lisinopril-hydrochlorothiazide (PRINZIDE,ZESTORETIC) 20-25 MG per tablet Take 1 tablet by mouth daily., Starting 11/18/2012, Until Discontinued, Normal     !! - Potential duplicate medications found. Please discuss with provider.     2.  The patient was instructed in symptomatic care and handouts were given. 3.  The patient was told to return if becoming worse in any way, if no better in 3 or 4 days, and given some red flag symptoms such as shortness of breath or chest pain that would indicate earlier return. 4.  Follow up with a primary care physician as soon as possible. I did not give him a refill was pravastatin today, since I think he needs to have a lipid panel and complete metabolic panel first.    Reuben Likes, MD 11/18/12 2118

## 2012-12-26 ENCOUNTER — Ambulatory Visit: Payer: BC Managed Care – PPO | Attending: Family Medicine | Admitting: Family Medicine

## 2012-12-26 ENCOUNTER — Encounter: Payer: Self-pay | Admitting: Family Medicine

## 2012-12-26 VITALS — BP 125/87 | HR 74 | Temp 98.7°F | Resp 16 | Ht 74.0 in | Wt 232.8 lb

## 2012-12-26 DIAGNOSIS — E785 Hyperlipidemia, unspecified: Secondary | ICD-10-CM

## 2012-12-26 DIAGNOSIS — I10 Essential (primary) hypertension: Secondary | ICD-10-CM

## 2012-12-26 MED ORDER — LISINOPRIL-HYDROCHLOROTHIAZIDE 20-25 MG PO TABS: 1.0000 | ORAL_TABLET | Freq: Every day | ORAL | Status: DC

## 2012-12-26 NOTE — Progress Notes (Signed)
Patient ID: Donald [name], male   DOB: 1970-10-17, 42 y.o.   MRN: [id number]  CC: establish  Interpreter used to communicate with patient HPI: Pt is presenting to get labs and refills of his meds for BP and says that he had been taking cholesterol medication in the past but was taken off because it had caused fever in his body.  He says he had taken pravastatin medication years ago. He says that he was taken off of it a while ago but still having symptoms of paresthesias in feet so it was not the medication that was causing those symptoms.    No Known Allergies Past Medical History  Diagnosis Date  . Hypertension   . Hyperlipidemia     NOT TAKING MEDICATION   No current outpatient prescriptions on file prior to visit.   No current facility-administered medications on file prior to visit.   Family History  Problem Relation Age of Onset  . Hypertension Mother    History   Social History  . Marital Status: Single    Spouse Name: N/A    Number of Children: 2  . Years of Education: 15   Occupational History  . machine operator [employer]   Social History Main Topics  . Smoking status: Never Smoker   . Smokeless tobacco: Not on file  . Alcohol Use: No  . Drug Use: No  . Sexually Active: Not on file   Other Topics Concern  . Not on file   Social History Narrative  . No narrative on file    Review of Systems  Constitutional: Negative for fever, chills, diaphoresis, activity change, appetite change and fatigue.  HENT: Negative for ear pain, nosebleeds, congestion, facial swelling, rhinorrhea, neck pain, neck stiffness and ear discharge.   Eyes: Negative for pain, discharge, redness, itching and visual disturbance.  Respiratory: Negative for cough, choking, chest tightness, shortness of breath, wheezing and stridor.   Cardiovascular: Negative for chest pain, palpitations and leg swelling.  Gastrointestinal: Negative for abdominal distention.  Genitourinary:  Negative for dysuria, urgency, frequency, hematuria, flank pain, decreased urine volume, difficulty urinating and dyspareunia.  Musculoskeletal: Negative for back pain, joint swelling, arthralgias and gait problem.  Neurological: Negative for dizziness, tremors, seizures, syncope, facial asymmetry, speech difficulty, weakness, light-headedness, numbness and headaches.  Hematological: Negative for adenopathy. Does not bruise/bleed easily.  Psychiatric/Behavioral: Negative for hallucinations, behavioral problems, confusion, dysphoric mood, decreased concentration and agitation.    Objective:   Filed Vitals:   12/26/12 0952  BP: 125/87  Pulse: 74  Temp: 98.7 F (37.1 C)  Resp: 16    Physical Exam  Constitutional: Appears well-developed and well-nourished. No distress.  HENT: Normocephalic. External right and left ear normal. Oropharynx is clear and moist.  Eyes: Conjunctivae and EOM are normal. PERRLA, no scleral icterus.  Neck: Normal ROM. Neck supple. No JVD. No tracheal deviation. No thyromegaly.  CVS: RRR, S1/S2 +, no murmurs, no gallops, no carotid bruit.  Pulmonary: Effort and breath sounds normal, no stridor, rhonchi, wheezes, rales.  Abdominal: Soft. BS +,  no distension, tenderness, rebound or guarding.  Musculoskeletal: Normal range of motion. No edema and no tenderness.  Lymphadenopathy: No lymphadenopathy noted, cervical, inguinal. Neuro: Alert. Normal reflexes, muscle tone coordination. No cranial nerve deficit. Skin: Skin is warm and dry. No rash noted. Not diaphoretic. No erythema. No pallor.  Psychiatric: Normal mood and affect. Behavior, judgment, thought content normal.   No results found for this basename: WBC, HGB, HCT, MCV, PLT  No results found for this basename: CREATININE, BUN, NA, K, CL, CO2    No results found for this basename: HGBA1C   Lipid Panel  No results found for this basename: chol, trig, hdl, cholhdl, vldl, ldlcalc       Assessment and  plan:   Patient Active Problem List   Diagnosis Date Noted  . Unspecified essential hypertension 12/26/2012  . Dyslipidemia 12/26/2012    check labs today  Refilled zestoretic  RTC in 4 months  The patient was given clear instructions to go to ER or return to medical center if symptoms don't improve, worsen or new problems develop.  The patient verbalized understanding.  The patient was told to call to get any lab results if not heard anything in the next week.    Rodney Langton, MD, CDE, FAAFP [facility] [place], [place]

## 2012-12-26 NOTE — Progress Notes (Signed)
HERE TO ESTABLISH CARE HX HTN,CHOLESTEROL TAKES BP MEDS DAILY/NEED REFILL NEED CHOLESTEROL MED

## 2012-12-26 NOTE — Patient Instructions (Signed)
Cholesterol °Cholesterol is a white, waxy, fat-like protein needed by your body in small amounts. The liver makes all the cholesterol you need. It is carried from the liver by the blood through the blood vessels. Deposits (plaque) may build up on blood vessel walls. This makes the arteries narrower and stiffer. Plaque increases the risk for heart attack and stroke. °You cannot feel your cholesterol level even if it is very high. The only way to know is by a blood test to check your lipid (fats) levels. Once you know your cholesterol levels, you should keep a record of the test results. Work with your caregiver to to keep your levels in the desired range. °WHAT THE RESULTS MEAN: °· Total cholesterol is a rough measure of all the cholesterol in your blood. °· LDL is the so-called bad cholesterol. This is the type that deposits cholesterol in the walls of the arteries. You want this level to be low. °· HDL is the good cholesterol because it cleans the arteries and carries the LDL away. You want this level to be high. °· Triglycerides are fat that the body can either burn for energy or store. High levels are closely linked to heart disease. °DESIRED LEVELS: °· Total cholesterol below 200. °· LDL below 100 for people at risk, below 70 for very high risk. °· HDL above 50 is good, above 60 is best. °· Triglycerides below 150. °HOW TO LOWER YOUR CHOLESTEROL: °· Diet. °· Choose fish or white meat chicken and turkey, roasted or baked. Limit fatty cuts of red meat, fried foods, and processed meats, such as sausage and lunch meat. °· Eat lots of fresh fruits and vegetables. Choose whole grains, beans, pasta, potatoes and cereals. °· Use only small amounts of olive, corn or canola oils. Avoid butter, mayonnaise, shortening or palm kernel oils. Avoid foods with trans-fats. °· Use skim/nonfat milk and low-fat/nonfat yogurt and cheeses. Avoid whole milk, cream, ice cream, egg yolks and cheeses. Healthy desserts include angel food  cake, ginger snaps, animal crackers, hard candy, popsicles, and low-fat/nonfat frozen yogurt. Avoid pastries, cakes, pies and cookies. °· Exercise. °· A regular program helps decrease LDL and raises HDL. °· Helps with weight control. °· Do things that increase your activity level like gardening, walking, or taking the stairs. °· Medication. °· May be prescribed by your caregiver to help lowering cholesterol and the risk for heart disease. °· You may need medicine even if your levels are normal if you have several risk factors. °HOME CARE INSTRUCTIONS  °· Follow your diet and exercise programs as suggested by your caregiver. °· Take medications as directed. °· Have blood work done when your caregiver feels it is necessary. °MAKE SURE YOU:  °· Understand these instructions. °· Will watch your condition. °· Will get help right away if you are not doing well or get worse. °Document Released: 02/14/2001 Document Revised: 08/14/2011 Document Reviewed: 08/07/2007 °ExitCare® Patient Information ©2014 [employer]. ° °Hypertension °As your heart beats, it forces blood through your arteries. This force is your blood pressure. If the pressure is too high, it is called hypertension (HTN) or high blood pressure. HTN is dangerous because you may have it and not know it. High blood pressure may mean that your heart has to work harder to pump blood. Your arteries may be narrow or stiff. The extra work puts you at risk for heart disease, stroke, and other problems.  °Blood pressure consists of two numbers, a higher number over a lower, 110/72, for   example. It is stated as "110 over 72." The ideal is below 120 for the top number (systolic) and under 80 for the bottom (diastolic). Write down your blood pressure today. °You should pay close attention to your blood pressure if you have certain conditions such as: °· Heart failure. °· Prior heart attack. °· Diabetes °· Chronic kidney disease. °· Prior stroke. °· Multiple risk factors  for heart disease. °To see if you have HTN, your blood pressure should be measured while you are seated with your arm held at the level of the heart. It should be measured at least twice. A one-time elevated blood pressure reading (especially in the Emergency Department) does not mean that you need treatment. There may be conditions in which the blood pressure is different between your right and left arms. It is important to see your caregiver soon for a recheck. °Most people have essential hypertension which means that there is not a specific cause. This type of high blood pressure may be lowered by changing lifestyle factors such as: °· Stress. °· Smoking. °· Lack of exercise. °· Excessive weight. °· Drug/tobacco/alcohol use. °· Eating less salt. °Most people do not have symptoms from high blood pressure until it has caused damage to the body. Effective treatment can often prevent, delay or reduce that damage. °TREATMENT  °When a cause has been identified, treatment for high blood pressure is directed at the cause. There are a large number of medications to treat HTN. These fall into several categories, and your caregiver will help you select the medicines that are best for you. Medications may have side effects. You should review side effects with your caregiver. °If your blood pressure stays high after you have made lifestyle changes or started on medicines,  °· Your medication(s) may need to be changed. °· Other problems may need to be addressed. °· Be certain you understand your prescriptions, and know how and when to take your medicine. °· Be sure to follow up with your caregiver within the time frame advised (usually within two weeks) to have your blood pressure rechecked and to review your medications. °· If you are taking more than one medicine to lower your blood pressure, make sure you know how and at what times they should be taken. Taking two medicines at the same time can result in blood pressure that  is too low. °SEEK IMMEDIATE MEDICAL CARE IF: °· You develop a severe headache, blurred or changing vision, or confusion. °· You have unusual weakness or numbness, or a faint feeling. °· You have severe chest or abdominal pain, vomiting, or breathing problems. °MAKE SURE YOU:  °· Understand these instructions. °· Will watch your condition. °· Will get help right away if you are not doing well or get worse. °Document Released: 05/22/2005 Document Revised: 08/14/2011 Document Reviewed: 01/10/2008 °ExitCare® Patient Information ©2014 [employer]. ° °

## 2012-12-27 ENCOUNTER — Other Ambulatory Visit: Payer: Self-pay | Admitting: Family Medicine

## 2012-12-27 ENCOUNTER — Telehealth: Payer: Self-pay | Admitting: *Deleted

## 2012-12-27 LAB — COMPLETE METABOLIC PANEL WITH GFR
Albumin: 4.2 g/dL (ref 3.5–5.2)
BUN: 14 mg/dL (ref 6–23)
CO2: 28 mEq/L (ref 19–32)
Calcium: 9.5 mg/dL (ref 8.4–10.5)
Chloride: 103 mEq/L (ref 96–112)
GFR, Est African American: >89 mL/min
GFR, Est Non African American: >89 mL/min
Glucose, Bld: 107 mg/dL — ABNORMAL HIGH (ref 70–99)
Potassium: 4.3 mEq/L (ref 3.5–5.3)

## 2012-12-27 LAB — LIPID PANEL: Cholesterol: 213 mg/dL — ABNORMAL HIGH (ref 0–200)

## 2012-12-27 MED ORDER — LOVASTATIN 40 MG PO TABS: 40.0000 mg | ORAL_TABLET | Freq: Every day | ORAL | Status: DC

## 2012-12-27 NOTE — Progress Notes (Signed)
Quick Note:  Please inform patient that labs reveal that his cholesterol levels are elevated. Prescription for lovastatin 40 mg electronically sent to his pharmacy for him to pick up and start taking to lower his cholesterol levels. Other labs OK except that his blood sugar was mildly elevated. Recommend rechecking labs in 3 months.   Donald Langton, MD, CDE, FAAFP [facility] [place], [place]   ______

## 2012-12-27 NOTE — Progress Notes (Signed)
12/27/12 Unable to reach patient message left via telephone that a hie cholesterol level was elevated and  A prescription for lovastatin was sent to  His pharmacy for pick up. Recommend rechecking labs in 3  Months. P.Janiaya Ryser,RN BSN [facility]

## 2013-01-01 ENCOUNTER — Emergency Department (HOSPITAL_COMMUNITY): Payer: BC Managed Care – PPO

## 2013-01-01 ENCOUNTER — Encounter (HOSPITAL_COMMUNITY): Payer: Self-pay | Admitting: Emergency Medicine

## 2013-01-01 ENCOUNTER — Emergency Department (HOSPITAL_COMMUNITY)
Admission: EM | Admit: 2013-01-01 | Discharge: 2013-01-01 | Disposition: A | Discharge status: Home / Self Care | Payer: BC Managed Care – PPO | Attending: Emergency Medicine | Admitting: Emergency Medicine

## 2013-01-01 DIAGNOSIS — Z7982 Long term (current) use of aspirin: Secondary | ICD-10-CM | POA: Insufficient documentation

## 2013-01-01 DIAGNOSIS — I10 Essential (primary) hypertension: Secondary | ICD-10-CM | POA: Insufficient documentation

## 2013-01-01 DIAGNOSIS — R209 Unspecified disturbances of skin sensation: Secondary | ICD-10-CM | POA: Insufficient documentation

## 2013-01-01 DIAGNOSIS — Z79899 Other long term (current) drug therapy: Secondary | ICD-10-CM | POA: Insufficient documentation

## 2013-01-01 DIAGNOSIS — R200 Anesthesia of skin: Secondary | ICD-10-CM

## 2013-01-01 DIAGNOSIS — E7800 Pure hypercholesterolemia, unspecified: Secondary | ICD-10-CM | POA: Insufficient documentation

## 2013-01-01 DIAGNOSIS — R0789 Other chest pain: Secondary | ICD-10-CM | POA: Insufficient documentation

## 2013-01-01 HISTORY — DX: Pure hypercholesterolemia, unspecified: E78.00

## 2013-01-01 LAB — CBC
HCT: 39.5 % (ref 39.0–52.0)
Hemoglobin: 14.1 g/dL (ref 13.0–17.0)
MCH: 29.1 pg (ref 26.0–34.0)
MCV: 81.4 fL (ref 78.0–100.0)
Platelets: 263 10*3/uL (ref 150–400)
RBC: 4.85 MIL/uL (ref 4.22–5.81)
WBC: 6 10*3/uL (ref 4.0–10.5)

## 2013-01-01 LAB — BASIC METABOLIC PANEL
BUN: 15 mg/dL (ref 6–23)
CO2: 27 mEq/L (ref 19–32)
Calcium: 9.6 mg/dL (ref 8.4–10.5)
Chloride: 98 mEq/L (ref 96–112)
Creatinine, Ser: 0.89 mg/dL (ref 0.50–1.35)
Glucose, Bld: 112 mg/dL — ABNORMAL HIGH (ref 70–99)

## 2013-01-01 LAB — TROPONIN I: Troponin I: <0.3 ng/mL (ref <0.30)

## 2013-01-01 MED ORDER — LORAZEPAM 1 MG PO TABS
1.0000 mg | ORAL_TABLET | Freq: Once | ORAL | Status: AC
  Administered 2013-01-01: 1 mg via ORAL
  Filled 2013-01-01: qty 1

## 2013-01-01 NOTE — ED Notes (Signed)
Pt states he awoke this morning with numbness in his hands and fett.  Pt states he is also having chest pressure since awakening.  Pt has equal bilateral grip strength.

## 2013-01-01 NOTE — ED Notes (Signed)
PT. WOKE UP FROM SLEEP WITH HANDS , FEET AND LIPS NUMBNESS , ALERT AND ORIENTED , AMBULATORY , RESPIRATIONS UNLABORED. EQUAL STRONG GRIPS / NO ARM DRIFT.

## 2013-01-01 NOTE — ED Notes (Signed)
Pt given water per Dr.Campos

## 2013-01-01 NOTE — ED Provider Notes (Signed)
CSN: [id number]     Arrival date & time 01/01/13  0127 History     First MD Initiated Contact with Patient 01/01/13 0132     Chief Complaint  Patient presents with  . Numbness  . Chest Pain    HPI Patient awoke from sleep feeling like his hands feet and lips were numb.  This was in his bilateral arms and legs.  It tended to be more of his fingertips and his toes.  He never had this happen before.  He also had a sense of chest pressure.  No prior history of cardiac disease.  He does have a history of hypertension hypercholesterolemia.  He does not smoke cigarettes.  He denies shortness of breath.  He has no prior history of anxiety or panic attacks.  He's never had these symptoms before.  He denies weakness of his arms or legs  Past Medical History  Diagnosis Date  . Hypertension   . Hypercholesterolemia    Past Surgical History  Procedure Laterality Date  . Hand surgery     No family history on file. History  Substance Use Topics  . Smoking status: Never Smoker   . Smokeless tobacco: Not on file  . Alcohol Use: No    Review of Systems  All other systems reviewed and are negative.    Allergies  Sulfonamide derivatives  Home Medications   Current Outpatient Rx  Name  Route  Sig  Dispense  Refill  . aspirin 325 MG tablet   Oral   Take 162 mg by mouth daily.         Marland Kitchen lisinopril-hydrochlorothiazide (PRINZIDE,ZESTORETIC) 20-25 MG per tablet   Oral   Take 1 tablet by mouth daily.   30 tablet   3   . pravastatin (PRAVACHOL) 40 MG tablet   Oral   Take 40 mg by mouth daily.          BP 167/95  Pulse 97  Temp(Src) 97.9 F (36.6 C) (Oral)  Resp 18  SpO2 98% Physical Exam  Nursing note and vitals reviewed. Constitutional: He is oriented to person, place, and time. He appears well-developed and well-nourished.  HENT:  Head: Normocephalic and atraumatic.  Eyes: EOM are normal.  Neck: Normal range of motion.  Cardiovascular: Normal rate, regular rhythm,  normal heart sounds and intact distal pulses.   Pulmonary/Chest: Effort normal and breath sounds normal. No respiratory distress.  Abdominal: Soft. He exhibits no distension. There is no tenderness.  Genitourinary: Rectum normal.  Musculoskeletal: Normal range of motion.  Neurological: He is alert and oriented to person, place, and time.  Skin: Skin is warm and dry.  Psychiatric: He has a normal mood and affect. Judgment normal.    ED Course   Procedures (including critical care time)   Date: 01/01/2013  Rate: 92  Rhythm: normal sinus rhythm  QRS Axis: normal  Intervals: normal  ST/T Wave abnormalities: normal  Conduction Disutrbances: RBBB  Narrative Interpretation:   Old EKG Reviewed: No significant changes noted     Labs Reviewed  BASIC METABOLIC PANEL - Abnormal; Notable for the following:    Glucose, Bld 112 (*)    All other components within normal limits  CBC  TROPONIN I   Dg Chest 2 View  01/01/2013   *RADIOLOGY REPORT*  Clinical Data: Chest pressure.  Numbness.  CHEST - 2 VIEW  Comparison: 04/04/2009.  Findings:  Cardiopericardial silhouette within normal limits. Mediastinal contours normal. Trachea midline.  No airspace disease or effusion.  No pneumothorax.  IMPRESSION: No active cardiopulmonary disease.   Original Report Authenticated By: Andreas Newport, M.D.   I personally reviewed the imaging tests through PACS system I reviewed available ER/hospitalization records through the EMR   1. Numbness     MDM  4:39 AM Patient feels much better at this time.  Discharge home in good condition.  All the symptoms have resolved.  Much of this may been secondary to anxiety with associated numbness and tingling in his bilateral fingers and toes and around his lips.  Doubt PE.  Doubt ACS.  Lyanne Co, MD 01/01/13 [phone]

## 2013-04-28 ENCOUNTER — Ambulatory Visit: Payer: BC Managed Care – PPO | Admitting: Internal Medicine

## 2013-04-28 ENCOUNTER — Ambulatory Visit (INDEPENDENT_AMBULATORY_CARE_PROVIDER_SITE_OTHER): Payer: BC Managed Care – PPO | Admitting: Emergency Medicine

## 2013-04-28 VITALS — BP 112/78 | HR 66 | Temp 98.5°F | Resp 16 | Ht 74.0 in | Wt 237.0 lb

## 2013-04-28 DIAGNOSIS — I10 Essential (primary) hypertension: Secondary | ICD-10-CM

## 2013-04-28 DIAGNOSIS — E785 Hyperlipidemia, unspecified: Secondary | ICD-10-CM

## 2013-04-28 LAB — COMPREHENSIVE METABOLIC PANEL
ALT: 18 U/L (ref 0–53)
Albumin: 4.8 g/dL (ref 3.5–5.2)
Alkaline Phosphatase: 59 U/L (ref 39–117)
CO2: 29 mEq/L (ref 19–32)
Glucose, Bld: 87 mg/dL (ref 70–99)
Potassium: 4.1 mEq/L (ref 3.5–5.3)
Sodium: 140 mEq/L (ref 135–145)
Total Protein: 8.2 g/dL (ref 6.0–8.3)

## 2013-04-28 LAB — POCT CBC
Granulocyte percent: 32.4 %G — AB (ref 37–80)
HCT, POC: 47.1 % (ref 43.5–53.7)
Hemoglobin: 15.1 g/dL (ref 14.1–18.1)
Lymph, poc: 3.1 (ref 0.6–3.4)
MCHC: 32.1 g/dL (ref 31.8–35.4)
POC Granulocyte: 1.8 — AB (ref 2–6.9)
RBC: 5.37 M/uL (ref 4.69–6.13)

## 2013-04-28 LAB — LIPID PANEL: Total CHOL/HDL Ratio: 4.3 Ratio

## 2013-04-28 MED ORDER — LISINOPRIL-HYDROCHLOROTHIAZIDE 20-25 MG PO TABS: 1.0000 | ORAL_TABLET | Freq: Every day | ORAL | Status: DC

## 2013-04-28 MED ORDER — PRAVASTATIN SODIUM 40 MG PO TABS: 40.0000 mg | ORAL_TABLET | Freq: Every day | ORAL | Status: DC

## 2013-04-28 NOTE — Progress Notes (Addendum)
This chart was scribed for Lesle Chris, MD by Joaquin Music, ED Scribe. This patient was seen in room Room/bed 8 and the patient's care was started at 10:25 AM.  Subjective:    Patient ID: Donald [name], male    DOB: 12/20/1970, 42 y.o.   MRN: [id number]  HPI Donald [name] is a 42 y.o. male who presents to the [facility] complaining of HTN and hyperlipidemia. Pt states he has been taking his medications (Lysenopril and Pravastatin) as prescribed. He states he is generally seen at [facility] and was last seen there 3 months ago. Pt states he has been feeling well. He states when he is seated for long periods of times and stands up, he has a tendency to feel dizzy. Pt denies SOB, fever, and cough.  Current outpatient prescriptions:aspirin 325 MG tablet, Take 162 mg by mouth daily., Disp: , Rfl: ;  lisinopril-hydrochlorothiazide (PRINZIDE,ZESTORETIC) 20-25 MG per tablet, Take 1 tablet by mouth daily., Disp: 30 tablet, Rfl: 3;  pravastatin (PRAVACHOL) 40 MG tablet, Take 40 mg by mouth daily., Disp: , Rfl:   Patient Active Problem List   Diagnosis Date Noted  . RIGHT BUNDLE BRANCH BLOCK 03/11/2010  . DEPRESSION, MILD 03/09/2010  . LUMBAGO 03/09/2010  . DYSURIA 03/09/2010  . TRIGGER FINGER, LEFT THUMB 12/10/2009  . OTHER SYMPTOMS INVOLVING HEAD AND NECK 12/10/2009  . LATE EFFECT OF TENDON INJURY 12/10/2009  . NEPHROLITHIASIS, HX OF 12/10/2009  . HYPERTENSION 05/14/2009  . PARASOMNIA 05/14/2009  . HYPERLIPIDEMIA 05/13/2009  . G E R D 05/13/2009    Review of Systems  All other systems reviewed and are negative.   Objective:   Physical Exam CONSTITUTIONAL: Well developed/well nourished HEAD: Normocephalic/atraumatic EYES: EOMI/PERRL ENMT: Mucous membranes moist NECK: supple no meningeal signs SPINE:entire spine nontender CV: S1/S2 noted, no murmurs/rubs/gallops noted LUNGS: Lungs are clear to auscultation bilaterally, no apparent distress ABDOMEN: soft, nontender, no  rebound or guarding GU:no cva tenderness NEURO: Pt is awake/alert, moves all extremitiesx4 EXTREMITIES: pulses normal, full ROM SKIN: warm, color normal PSYCH: no abnormalities of mood noted  Results for orders placed in visit on 04/28/13  POCT CBC      Result Value Range   WBC 5.5  4.6 - 10.2 K/uL   Lymph, poc 3.1  0.6 - 3.4   POC LYMPH PERCENT 57.0 (*) 10 - 50 %L   MID (cbc) 0.6  0 - 0.9   POC MID % 10.6  0 - 12 %M   POC Granulocyte 1.8 (*) 2 - 6.9   Granulocyte percent 32.4 (*) 37 - 80 %G   RBC 5.37  4.69 - 6.13 M/uL   Hemoglobin 15.1  14.1 - 18.1 g/dL   HCT, POC 40.9  81.1 - 53.7 %   MCV 87.7  80 - 97 fL   MCH, POC 28.1  27 - 31.2 pg   MCHC 32.1  31.8 - 35.4 g/dL   RDW, POC 91.4     Platelet Count, POC 322  142 - 424 K/uL   MPV 9.3  0 - 99.8 fL   Triage Vitals:BP 112/78  Pulse 66  Temp(Src) 98.5 F (36.9 C) (Oral)  Resp 16  Ht 6\' 2"  (1.88 m)  Wt 237 lb (107.502 kg)  BMI 30.42 kg/m2  SpO2 100%  Assessment & Plan:   Routine labs were done today meds were refilled advise regular physical in 3-6 months. I personally performed the services described in this documentation, which was scribed in my presence. The recorded information  has been reviewed and is accurate.

## 2013-08-10 ENCOUNTER — Emergency Department (HOSPITAL_COMMUNITY): Payer: 59

## 2013-08-10 ENCOUNTER — Encounter (HOSPITAL_COMMUNITY): Payer: Self-pay | Admitting: Emergency Medicine

## 2013-08-10 ENCOUNTER — Emergency Department (HOSPITAL_COMMUNITY)
Admission: EM | Admit: 2013-08-10 | Discharge: 2013-08-10 | Disposition: A | Discharge status: Home / Self Care | Payer: 59 | Attending: Emergency Medicine | Admitting: Emergency Medicine

## 2013-08-10 ENCOUNTER — Ambulatory Visit: Payer: 59

## 2013-08-10 ENCOUNTER — Other Ambulatory Visit: Payer: Self-pay

## 2013-08-10 ENCOUNTER — Ambulatory Visit (INDEPENDENT_AMBULATORY_CARE_PROVIDER_SITE_OTHER): Payer: 59 | Admitting: Family Medicine

## 2013-08-10 VITALS — BP 116/72 | HR 70 | Temp 98.4°F | Resp 16 | Ht 73.0 in | Wt 238.8 lb

## 2013-08-10 DIAGNOSIS — Z7982 Long term (current) use of aspirin: Secondary | ICD-10-CM | POA: Insufficient documentation

## 2013-08-10 DIAGNOSIS — R61 Generalized hyperhidrosis: Secondary | ICD-10-CM

## 2013-08-10 DIAGNOSIS — Z79899 Other long term (current) drug therapy: Secondary | ICD-10-CM | POA: Insufficient documentation

## 2013-08-10 DIAGNOSIS — E7800 Pure hypercholesterolemia, unspecified: Secondary | ICD-10-CM | POA: Insufficient documentation

## 2013-08-10 DIAGNOSIS — R079 Chest pain, unspecified: Secondary | ICD-10-CM | POA: Insufficient documentation

## 2013-08-10 DIAGNOSIS — I10 Essential (primary) hypertension: Secondary | ICD-10-CM | POA: Insufficient documentation

## 2013-08-10 LAB — POCT CBC
Granulocyte percent: 36 %G — AB (ref 37–80)
HCT, POC: 44.3 % (ref 43.5–53.7)
Hemoglobin: 14.1 g/dL (ref 14.1–18.1)
Lymph, poc: 2.9 (ref 0.6–3.4)
MCH, POC: 27.6 pg (ref 27–31.2)
MCHC: 31.8 g/dL (ref 31.8–35.4)
MCV: 86.7 fL (ref 80–97)
MID (cbc): 0.5 (ref 0–0.9)
MPV: 8.9 fL (ref 0–99.8)
POC Granulocyte: 1.9 — AB (ref 2–6.9)
POC LYMPH PERCENT: 53.9 %L — AB (ref 10–50)
POC MID %: 10.1 %M (ref 0–12)
Platelet Count, POC: 345 10*3/uL (ref 142–424)
RBC: 5.11 M/uL (ref 4.69–6.13)
RDW, POC: 14.8 %
WBC: 5.4 10*3/uL (ref 4.6–10.2)

## 2013-08-10 LAB — CBC
HEMATOCRIT: 41.6 % (ref 39.0–52.0)
HEMOGLOBIN: 14.2 g/dL (ref 13.0–17.0)
MCH: 28.3 pg (ref 26.0–34.0)
MCHC: 34.1 g/dL (ref 30.0–36.0)
MCV: 82.9 fL (ref 78.0–100.0)
PLATELETS: 291 10*3/uL (ref 150–400)
RBC: 5.02 MIL/uL (ref 4.22–5.81)
RDW: 14 % (ref 11.5–15.5)
WBC: 4.7 10*3/uL (ref 4.0–10.5)

## 2013-08-10 LAB — BASIC METABOLIC PANEL
BUN: 13 mg/dL (ref 6–23)
CHLORIDE: 100 meq/L (ref 96–112)
CO2: 26 meq/L (ref 19–32)
CREATININE: 0.82 mg/dL (ref 0.50–1.35)
Calcium: 9.4 mg/dL (ref 8.4–10.5)
GFR calc Af Amer: >90 mL/min (ref >90)
GFR calc non Af Amer: >90 mL/min (ref >90)
GLUCOSE: 96 mg/dL (ref 70–99)
Potassium: 3.3 mEq/L — ABNORMAL LOW (ref 3.7–5.3)
Sodium: 138 mEq/L (ref 137–147)

## 2013-08-10 LAB — I-STAT TROPONIN, ED
TROPONIN I, POC: 0.03 ng/mL (ref 0.00–0.08)
Troponin i, poc: 0 ng/mL (ref 0.00–0.08)

## 2013-08-10 MED ORDER — NITROGLYCERIN 0.4 MG SL SUBL
0.4000 mg | SUBLINGUAL_TABLET | SUBLINGUAL | Status: DC | PRN
  Filled 2013-08-10: qty 1

## 2013-08-10 MED ORDER — MORPHINE SULFATE 4 MG/ML IJ SOLN
4.0000 mg | Freq: Once | INTRAMUSCULAR | Status: AC
  Administered 2013-08-10: 4 mg via INTRAVENOUS
  Filled 2013-08-10: qty 1

## 2013-08-10 NOTE — ED Provider Notes (Signed)
CSN: [id number]     Arrival date & time 08/10/13  1640 History   First MD Initiated Contact with Patient 08/10/13 1725     Chief Complaint  Patient presents with  . Chest Pain     (Consider location/radiation/quality/duration/timing/severity/associated sxs/prior Treatment) Patient is a 43 y.o. male presenting with chest pain. The history is provided by the patient. No language interpreter was used.  Chest Pain Pain location:  L chest Pain quality: pressure   Pain radiates to:  L shoulder Pain radiates to the back: yes   Pain severity:  Moderate Onset quality:  Gradual Duration:  2 days Timing:  Intermittent Progression:  Waxing and waning Chronicity:  Recurrent Context: at rest   Relieved by:  Nothing Worsened by:  Nothing tried Ineffective treatments:  None tried Associated symptoms: no abdominal pain, no cough, no diaphoresis, no fever, no shortness of breath, no syncope and not vomiting   Risk factors: high cholesterol, hypertension and male sex   Risk factors: no aortic disease, no diabetes mellitus, no prior DVT/PE and no smoking     Past Medical History  Diagnosis Date  . Hypertension   . Hypercholesterolemia    Past Surgical History  Procedure Laterality Date  . Hand surgery     History reviewed. No pertinent family history. History  Substance Use Topics  . Smoking status: Never Smoker   . Smokeless tobacco: Not on file  . Alcohol Use: No    Review of Systems  Constitutional: Negative for fever and diaphoresis.  Respiratory: Negative for cough and shortness of breath.   Cardiovascular: Positive for chest pain. Negative for syncope.  Gastrointestinal: Negative for vomiting and abdominal pain.      Allergies  Review of patient's allergies indicates no active allergies.  Home Medications   Current Outpatient Rx  Name  Route  Sig  Dispense  Refill  . aspirin 325 MG tablet   Oral   Take 162 mg by mouth daily.         Marland Kitchen  lisinopril-hydrochlorothiazide (PRINZIDE,ZESTORETIC) 20-25 MG per tablet   Oral   Take 1 tablet by mouth daily.   30 tablet   11   . pravastatin (PRAVACHOL) 40 MG tablet   Oral   Take 1 tablet (40 mg total) by mouth daily.   30 tablet   11    BP 117/80  Pulse 61  Temp(Src) 98.2 F (36.8 C) (Oral)  Resp 16  SpO2 99% Physical Exam  Constitutional: He is oriented to person, place, and time. He appears well-developed and well-nourished. No distress.  HENT:  Head: Normocephalic and atraumatic.  Mouth/Throat: No oropharyngeal exudate.  Eyes: Pupils are equal, round, and reactive to light.  Neck: Normal range of motion. Neck supple.  Cardiovascular: Normal rate, regular rhythm and normal heart sounds.  Exam reveals no gallop and no friction rub.   No murmur heard. Pulmonary/Chest: Effort normal and breath sounds normal. No respiratory distress. He has no wheezes. He has no rales.  Abdominal: Soft. Bowel sounds are normal. He exhibits no distension and no mass. There is no tenderness. There is no rebound and no guarding.  Musculoskeletal: Normal range of motion. He exhibits no edema and no tenderness.  Neurological: He is alert and oriented to person, place, and time.  Skin: Skin is warm and dry.  Psychiatric: He has a normal mood and affect.    ED Course  Procedures (including critical care time) Labs Review Labs Reviewed  BASIC METABOLIC PANEL - Abnormal;  Notable for the following:    Potassium 3.3 (*)    All other components within normal limits  CBC  I-STAT TROPOININ, ED  Randolm Idol, ED   Imaging Review Dg Chest 2 View  08/10/2013   CLINICAL DATA:  Chest pain  EXAM: CHEST  2 VIEW  COMPARISON:  None.  FINDINGS: The heart size and mediastinal contours are within normal limits. Both lungs are clear. The visualized skeletal structures are unremarkable.  IMPRESSION: No active cardiopulmonary disease.   Electronically Signed   By: Lajean Manes M.D.   On: 08/10/2013 15:17      EKG Interpretation   Date/Time:  Sunday August 10 2013 16:53:07 EDT Ventricular Rate:  61 PR Interval:  195 QRS Duration: 170 QT Interval:  454 QTC Calculation: 457 R Axis:   67 Text Interpretation:  Sinus rhythm Right bundle branch block Nonspecific T  wave abnormality No significant change since last tracing Confirmed by  L[place] [phone]) on 08/10/2013 5:18:02 PM Also confirmed by  Zaeda Mcferran  MD, [place] [phone])  on 08/10/2013 5:29:32 PM      MDM   Final diagnoses:  Chest pain    Pt is a 43 y.o. male with Pmhx as above who presents with intermittent Cp since Friday, described as L sided pressure with radiation to L shoulder w/o assoc symptoms.  No fever, chills, n/v, d/a, leg pain/swelling, SOB, cough. Hx of similar about 5 yrs ago after which he reports having a nml ex stress. {t sent from UC today after EKG showed RBB (which is old). EKG unchanged per my read, first trop not elevated, CBC, BMP grossly unremarkable except K 3.3 which was replaced.  CXR w/ nml.  Pain resolved after & IV morphine x2.  Delta tropnonin done with second drawn 8 hrs after CP onset and were negative. Doubt ACS, though given risk factors of HTN, HLD, have spoken to cardiology fellow on call for close outpt f/u.  Pt will return ot ED if symptoms return.  Return precautions given for new or worsening symptoms.          Neta Ehlers, MD 08/10/13 2211

## 2013-08-10 NOTE — Progress Notes (Addendum)
Subjective:    Patient ID: Donald [name], male    DOB: Jun 18, 1970, 43 y.o.   MRN: [id number]  HPI This chart was scribed for Robyn Haber, MD by [name], ED Scribe. This patient was seen in room 3 and the patient's care was started at 2:48 PM.  HPI Comments: Donald [name] is a 43 y.o. male who presents to the [facility] complaining of constant mild left sided chest and shoulder pain onset 2 days ago. Pt reports that he has HTN and states that his BP has been extremely low. He states that he has been trying to consume salt to get his BP up.  He reports that he has been feeling weak and has had gas. Pt denies fever, cough, nausea and abdominal pain.   Past Medical History  Diagnosis Date   Hypertension    Hypercholesterolemia    Allergies  Allergen Reactions   Sulfonamide Derivatives    Prior to Admission medications   Medication Sig Start Date End Date Taking? Authorizing Provider  pravastatin (PRAVACHOL) 40 MG tablet Take 1 tablet (40 mg total) by mouth daily. 04/28/13  Yes Darlyne Russian, MD  aspirin 325 MG tablet Take 162 mg by mouth daily.    Historical Provider, MD  lisinopril-hydrochlorothiazide (PRINZIDE,ZESTORETIC) 20-25 MG per tablet Take 1 tablet by mouth daily. 04/28/13   Darlyne Russian, MD   Review of Systems  Constitutional: Negative for fever.  Respiratory: Negative for cough.   Gastrointestinal: Negative for nausea and abdominal pain.  Neurological: Positive for weakness and headaches.      Objective:   Physical Exam  Nursing note and vitals reviewed. Constitutional: He is oriented to person, place, and time. He appears well-developed and well-nourished. No distress.  Unremarkable  HENT:  Head: Normocephalic and atraumatic.  Eyes: EOM are normal.  Neck: Normal range of motion. Neck supple.  No bruit    Cardiovascular: Normal rate and regular rhythm.  Exam reveals no gallop.   No murmur heard. Pulmonary/Chest: Effort  normal and breath sounds normal.  Musculoskeletal: Normal range of motion.  Neurological: He is alert and oriented to person, place, and time.  Skin: Skin is warm and dry. No rash noted.  Psychiatric: He has a normal mood and affect. His behavior is normal.  [facility] reading (PRIMARY) by  Dr. Joseph Art. CXR:  Normal EKG:   Results for orders placed in visit on 08/10/13  POCT CBC      Result Value Ref Range   WBC 5.4  4.6 - 10.2 K/uL   Lymph, poc 2.9  0.6 - 3.4   POC LYMPH PERCENT 53.9 (*) 10 - 50 %L   MID (cbc) 0.5  0 - 0.9   POC MID % 10.1  0 - 12 %M   POC Granulocyte 1.9 (*) 2 - 6.9   Granulocyte percent 36.0 (*) 37 - 80 %G   RBC 5.11  4.69 - 6.13 M/uL   Hemoglobin 14.1  14.1 - 18.1 g/dL   HCT, POC 44.3  43.5 - 53.7 %   MCV 86.7  80 - 97 fL   MCH, POC 27.6  27 - 31.2 pg   MCHC 31.8  31.8 - 35.4 g/dL   RDW, POC 14.8     Platelet Count, POC 345  142 - 424 K/uL   MPV 8.9  0 - 99.8 fL   EKG: Wide QRS complexes consistent with early right bundle branch block    Assessment & Plan:  Given the chest pain and abnormal EKG, I feel it's important to do further evaluation for the heart. The forms and patient to the: Emergency department for enzymes and further evaluation.  Chest pain - Plan: DG Chest 2 View, POCT CBC, EKG 12-Lead, EKG 12-Lead  Diaphoresis - Plan: POCT CBC, EKG 12-Lead, EKG 12-Lead  Signed, Robyn Haber, MD

## 2013-08-10 NOTE — Discharge Instructions (Signed)
Chest Pain (Nonspecific) °It is often hard to give a specific diagnosis for the cause of chest pain. There is always a chance that your pain could be related to something serious, such as a heart attack or a blood clot in the lungs. You need to follow up with your caregiver for further evaluation. °CAUSES  °· Heartburn. °· Pneumonia or bronchitis. °· Anxiety or stress. °· Inflammation around your heart (pericarditis) or lung (pleuritis or pleurisy). °· A blood clot in the lung. °· A collapsed lung (pneumothorax). It can develop suddenly on its own (spontaneous pneumothorax) or from injury (trauma) to the chest. °· Shingles infection (herpes zoster virus). °The chest wall is composed of bones, muscles, and cartilage. Any of these can be the source of the pain. °· The bones can be bruised by injury. °· The muscles or cartilage can be strained by coughing or overwork. °· The cartilage can be affected by inflammation and become sore (costochondritis). °DIAGNOSIS  °Lab tests or other studies, such as X-rays, electrocardiography, stress testing, or cardiac imaging, may be needed to find the cause of your pain.  °TREATMENT  °· Treatment depends on what may be causing your chest pain. Treatment may include: °· Acid blockers for heartburn. °· Anti-inflammatory medicine. °· Pain medicine for inflammatory conditions. °· Antibiotics if an infection is present. °· You may be advised to change lifestyle habits. This includes stopping smoking and avoiding alcohol, caffeine, and chocolate. °· You may be advised to keep your head raised (elevated) when sleeping. This reduces the chance of acid going backward from your stomach into your esophagus. °· Most of the time, nonspecific chest pain will improve within 2 to 3 days with rest and mild pain medicine. °HOME CARE INSTRUCTIONS  °· If antibiotics were prescribed, take your antibiotics as directed. Finish them even if you start to feel better. °· For the next few days, avoid physical  activities that bring on chest pain. Continue physical activities as directed. °· Do not smoke. °· Avoid drinking alcohol. °· Only take over-the-counter or prescription medicine for pain, discomfort, or fever as directed by your caregiver. °· Follow your caregiver's suggestions for further testing if your chest pain does not go away. °· Keep any follow-up appointments you made. If you do not go to an appointment, you could develop lasting (chronic) problems with pain. If there is any problem keeping an appointment, you must call to reschedule. °SEEK MEDICAL CARE IF:  °· You think you are having problems from the medicine you are taking. Read your medicine instructions carefully. °· Your chest pain does not go away, even after treatment. °· You develop a rash with blisters on your chest. °SEEK IMMEDIATE MEDICAL CARE IF:  °· You have increased chest pain or pain that spreads to your arm, neck, jaw, back, or abdomen. °· You develop shortness of breath, an increasing cough, or you are coughing up blood. °· You have severe back or abdominal pain, feel nauseous, or vomit. °· You develop severe weakness, fainting, or chills. °· You have a fever. °THIS IS AN EMERGENCY. Do not wait to see if the pain will go away. Get medical help at once. Call your local emergency services (911 in [place] Do not drive yourself to the hospital. °MAKE SURE YOU:  °· Understand these instructions. °· Will watch your condition. °· Will get help right away if you are not doing well or get worse. °Document Released: 03/01/2005 Document Revised: 08/14/2011 Document Reviewed: 12/26/2007 °Exit[employer]® Patient Information ©2014 [employer]   [employer]. ° °

## 2013-08-10 NOTE — ED Notes (Signed)
Per ems, pt c/o left shoulder pain and back pain x2 days. Pt came from [facility]. Given 1 nitro by ems, UC gave pt 324 asa. No relief. C/o intermittent pressure, no change with movement. Lung sonds clear. Denies injury or heavy lifting. Hx of pain in area before. Right bundle branch block on EKG. No old EKG to compare. Pt AAOx4, in NAD.

## 2013-09-26 ENCOUNTER — Encounter: Payer: Self-pay | Admitting: Cardiovascular Disease

## 2013-09-26 ENCOUNTER — Encounter: Payer: Self-pay | Admitting: *Deleted

## 2013-09-26 ENCOUNTER — Ambulatory Visit: Payer: BC Managed Care – PPO | Admitting: Cardiovascular Disease

## 2013-09-29 ENCOUNTER — Encounter: Payer: Self-pay | Admitting: Cardiovascular Disease

## 2013-09-29 ENCOUNTER — Ambulatory Visit (INDEPENDENT_AMBULATORY_CARE_PROVIDER_SITE_OTHER): Payer: 59 | Admitting: Cardiovascular Disease

## 2013-09-29 VITALS — BP 142/84 | HR 79 | Ht 74.0 in | Wt 240.8 lb

## 2013-09-29 DIAGNOSIS — I10 Essential (primary) hypertension: Secondary | ICD-10-CM

## 2013-09-29 DIAGNOSIS — I4510 Unspecified right bundle-branch block: Secondary | ICD-10-CM

## 2013-09-29 DIAGNOSIS — R079 Chest pain, unspecified: Secondary | ICD-10-CM

## 2013-09-29 DIAGNOSIS — E785 Hyperlipidemia, unspecified: Secondary | ICD-10-CM

## 2013-09-29 NOTE — Patient Instructions (Signed)

## 2013-09-29 NOTE — Progress Notes (Signed)
Patient ID: Donald [name], male   DOB: September 27, 1970, 43 y.o.   MRN: [id number]   43 yo referred from ER for chest pain  Seen there 08/10/13  R/O CXR NAD ECG with RBBB  CRF;s HTN and elevated lipids on Rx  Pains for a few months.  Atypical Frequently start with back pain and radiate to front.  Can last days then go away and come back in a month Not related to exertion.  No associated positional pain.  Sometimes feels achy all over No lung disease or pleuritic component.  Mediastinum normal on CXR  Compliant with meds  Sedentary but pain not releated to activity or food.      ROS: Denies fever, malais, weight loss, blurry vision, decreased visual acuity, cough, sputum, SOB, hemoptysis, pleuritic pain, palpitaitons, heartburn, abdominal pain, melena, lower extremity edema, claudication, or rash.  All other systems reviewed and negative   General: Affect appropriate Healthy:  appears stated age 2: normal Neck supple with no adenopathy JVP normal no bruits no thyromegaly Lungs clear with no wheezing and good diaphragmatic motion Heart:  S1/S2 no murmur,rub, gallop or click PMI normal Abdomen: benighn, BS positve, no tenderness, no AAA no bruit.  No HSM or HJR Distal pulses intact with no bruits No edema Neuro non-focal Skin warm and dry No muscular weakness  Medications Current Outpatient Prescriptions  Medication Sig Dispense Refill  . aspirin 325 MG tablet Take 162 mg by mouth daily.      Marland Kitchen lisinopril-hydrochlorothiazide (PRINZIDE,ZESTORETIC) 20-25 MG per tablet Take 1 tablet by mouth daily.  30 tablet  11  . pravastatin (PRAVACHOL) 40 MG tablet Take 1 tablet (40 mg total) by mouth daily.  30 tablet  11   No current facility-administered medications for this visit.    Allergies Review of patient's allergies indicates no active allergies.  Family History: No family history on file.  Social History: History   Social History  . Marital Status: Single    Spouse Name: N/A      Number of Children: N/A  . Years of Education: N/A   Occupational History  . Not on file.   Social History Main Topics  . Smoking status: Never Smoker   . Smokeless tobacco: Not on file  . Alcohol Use: No  . Drug Use: No  . Sexual Activity: Not on file   Other Topics Concern  . Not on file   Social History Narrative  . No narrative on file    Electrocardiogram:  SR RBBB otherwise normal   Assessment and Plan

## 2013-09-29 NOTE — Assessment & Plan Note (Signed)
Cholesterol is at goal.  Continue current dose of statin and diet Rx.  No myalgias or side effects.  F/U  LFT's in 6 months. Lab Results  Component Value Date   LDLCALC 114* 04/28/2013   Labs with [facility]

## 2013-09-29 NOTE — Assessment & Plan Note (Signed)
Well controlled.  Continue current medications and low sodium Dash type diet.    

## 2013-09-29 NOTE — Assessment & Plan Note (Signed)
Benign  Also seen on ECG in 2014  Yearly ECG

## 2013-09-29 NOTE — Assessment & Plan Note (Signed)
Atypical Normal ST segments thinks he had normal stress test 2007  ST segments normal despite RBBB should be able to do ETT

## 2013-10-01 ENCOUNTER — Telehealth (HOSPITAL_COMMUNITY): Payer: Self-pay

## 2013-10-02 ENCOUNTER — Telehealth (HOSPITAL_COMMUNITY): Payer: Self-pay

## 2013-10-03 ENCOUNTER — Ambulatory Visit (HOSPITAL_COMMUNITY)
Admission: RE | Admit: 2013-10-03 | Discharge: 2013-10-03 | Disposition: A | Discharge status: Home / Self Care | Payer: 59 | Source: Ambulatory Visit | Attending: Cardiovascular Disease | Admitting: Cardiovascular Disease

## 2013-10-03 DIAGNOSIS — R079 Chest pain, unspecified: Secondary | ICD-10-CM | POA: Insufficient documentation

## 2013-10-10 ENCOUNTER — Telehealth: Payer: Self-pay | Admitting: *Deleted

## 2013-10-10 NOTE — Telephone Encounter (Signed)
Eather Colas ','<More Detail >>       Josue Hector, MD       Sent: Wed Oct 08, 2013 8:58 PM    To: Richmond Campbell, LPN                   Message     Normal ett        ----- Message -----    From: Kendra Opitz Moffitt    Sent: 10/06/2013 8:24 AM    To: Josue Hector, MD                                  Results Exercise Tolerance Test (Order [id number])      Exercise Tolerance Test   Status: Final result Visible to patient: This result is not viewable by the patient. Next appt: None         Specimen Collected: 10/03/13 11:34 AM Last Resulted: 10/03/13 5:15 PM          Linked Documents     View Image               Result Information     Status     Final result (10/03/2013 5:15 PM)     Provider Status: Ordered    Encounter      View Encounter                Lab Information     MUSE               Order-Level Documents:     There are no order-level documents.         Exercise Tolerance Test (Order [id number]) Released By: Auto-released Date: 10/03/2013  ECG Authorizing: Provider Default, MD Department: [facility]  Order: [id number]         Provider Default, MD  NPI:         Order Information     Order Date/Time Release Date/Time Start Date/Time End Date/Time    10/03/13 11:34 AM 10/03/13 11:34 AM 10/03/13 11:34 AM 10/03/13 11:34 AM           Order Details     Frequency Duration Priority Order Class    Once 1 occurrence Routine Normal           Comments     Ordered by Jenkins Rouge           Order History Inpatient     Date/Time Action Taken User Additional Information    10/03/13 1134 Release  From Order: [id number]    10/03/13 1715 Result Lab in [facility] Interface Final           Collection Information     Collected: 10/03/2013 11:34 AM Resulting Agency: [facility]          Patient Information     Patient Name Sex DOB SSN    Holman, Bonsignore Male 27-Mar-1971 [id number]           Additional Information     Associated Reports    View Parent Encounter    Priority and Order Details    Collection Information.           Order Company secretary Lab in [place] -- -- --  Authorizing Provider Provider Default, MD -- -- --            Encounter     View Encounter            Order-Level Documents:     There are no order-level documents.         PT NOTIFIED OF  GXT  RESULTS./CY

## 2013-11-23 ENCOUNTER — Ambulatory Visit (INDEPENDENT_AMBULATORY_CARE_PROVIDER_SITE_OTHER): Payer: 59 | Admitting: Family Medicine

## 2013-11-23 VITALS — BP 126/74 | HR 70 | Temp 97.8°F | Resp 18 | Ht 74.0 in | Wt 240.0 lb

## 2013-11-23 DIAGNOSIS — J039 Acute tonsillitis, unspecified: Secondary | ICD-10-CM

## 2013-11-23 DIAGNOSIS — J029 Acute pharyngitis, unspecified: Secondary | ICD-10-CM

## 2013-11-23 LAB — POCT RAPID STREP A (OFFICE): Rapid Strep A Screen: NEGATIVE

## 2013-11-23 MED ORDER — AMOXICILLIN 500 MG PO CAPS: 500.0000 mg | ORAL_CAPSULE | Freq: Three times a day (TID) | ORAL | Status: AC

## 2013-11-23 NOTE — Patient Instructions (Signed)

## 2013-11-23 NOTE — Progress Notes (Signed)
Chief Complaint:  Chief Complaint  Patient presents with  . Sore Throat    x 2 days    HPI: Donald [name] is a 43 y.o. male who is here for 2 dau history of sore thorat, he has this for about every 1.5 to 2 months, he sometimes would get fevers, body caches and he feels he has food stuck in his tonsisl and he ahs bad breath.  Fevers are subjective, he takes tylneol and aspirin and then it foes away , pain wise. SOmetime he has it for 10 days or more. He has this pattern for 1 year and a half. He ahs 2 kids, 8 and 23 yrs old, they are both in daycare, non one at home is sick.  Past Medical History  Diagnosis Date  . Hypertension   . Hypercholesterolemia   . Right bundle branch block   . PARASOMNIA   . NEPHROLITHIASIS, HX OF   . Lumbago   . G E R D   . Dysuria   . DEPRESSION, MILD    Past Surgical History  Procedure Laterality Date  . Hand surgery     History   Social History  . Marital Status: Single    Spouse Name: N/A    Number of Children: N/A  . Years of Education: N/A   Social History Main Topics  . Smoking status: Never Smoker   . Smokeless tobacco: None  . Alcohol Use: No  . Drug Use: No  . Sexual Activity: None   Other Topics Concern  . None   Social History Narrative  . None   History reviewed. No pertinent family history. No Known Allergies Prior to Admission medications   Medication Sig Start Date End Date Taking? Authorizing Provider  aspirin 325 MG tablet Take 162 mg by mouth daily.   Yes Historical Provider, MD  lisinopril-hydrochlorothiazide (PRINZIDE,ZESTORETIC) 20-25 MG per tablet Take 1 tablet by mouth daily. 04/28/13  Yes Darlyne Russian, MD  pravastatin (PRAVACHOL) 40 MG tablet Take 1 tablet (40 mg total) by mouth daily. 04/28/13  Yes Darlyne Russian, MD     ROS: The patient denies night sweats, unintentional weight loss, chest pain, palpitations, wheezing, dyspnea on exertion, nausea, vomiting, abdominal pain, dysuria, hematuria,  melena, numbness, weakness, or tingling.   All other systems have been reviewed and were otherwise negative with the exception of those mentioned in the HPI and as above.    PHYSICAL EXAM: Filed Vitals:   11/23/13 1430  BP: 126/74  Pulse: 70  Temp: 97.8 F (36.6 C)  Resp: 18   Filed Vitals:   11/23/13 1430  Height: 6\' 2"  (1.88 m)  Weight: 240 lb (108.863 kg)   Body mass index is 30.8 kg/(m^2).  General: Alert, no acute distress HEENT:  Normocephalic, atraumatic, oropharynx patent. EOMI, PERRLA, + erythematous tonsils, right greater than left, no exudates. , Tm nl Cardiovascular:  Regular rate and rhythm, no rubs murmurs or gallops.  No Carotid bruits, radial pulse intact. No pedal edema.  Respiratory: Clear to auscultation bilaterally.  No wheezes, rales, or rhonchi.  No cyanosis, no use of accessory musculature GI: No organomegaly, abdomen is soft and non-tender, positive bowel sounds.  No masses. Skin: No rashes. Neurologic: Facial musculature symmetric. Psychiatric: Patient is appropriate throughout our interaction. Lymphatic: No cervical lymphadenopathy Musculoskeletal: Gait intact.   LABS: Results for orders placed in visit on 11/23/13  POCT RAPID STREP A (OFFICE)      Result Value  Ref Range   Rapid Strep A Screen Negative  Negative     EKG/XRAY:   Primary read interpreted by Dr. Marin Comment at [facility]   ASSESSMENT/PLAN: Encounter Diagnoses  Name Primary?  . Acute pharyngitis, unspecified pharyngitis type Yes  . Acute tonsillitis    Rx amoxacillin 500 mg TID He would like referral to ENT for possibel removal of tonsils Throat cx pending F/u prn   Gross sideeffects, risk and benefits, and alternatives of medications d/w patient. Patient is aware that all medications have potential sideeffects and we are unable to predict every sideeffect or drug-drug interaction that may occur.  LE, [place], DO 11/23/2013 3:26 PM

## 2013-11-25 LAB — CULTURE, GROUP A STREP: Organism ID, Bacteria: NORMAL

## 2013-12-03 NOTE — Telephone Encounter (Signed)
Encounter complete. 

## 2013-12-10 ENCOUNTER — Telehealth: Payer: Self-pay

## 2013-12-10 ENCOUNTER — Encounter: Payer: Self-pay | Admitting: Family Medicine

## 2013-12-10 NOTE — Telephone Encounter (Signed)
Lm advising pt to RTC on Friday to see Dr. Everlene Farrier for medication refills. We can only give 1 month supply due to his time for follow up.

## 2013-12-10 NOTE — Telephone Encounter (Signed)
Patient stopped by stating [place] is going [place] of the [place] 3 months leaving on July 15th. Patient requesting a 72mth supply of "Lisinopril" and "Pravastatin". Patient would like it sent to [facility]. Patient would like to be called when it has been sent at [phone]

## 2013-12-12 ENCOUNTER — Ambulatory Visit (INDEPENDENT_AMBULATORY_CARE_PROVIDER_SITE_OTHER): Payer: 59 | Admitting: Emergency Medicine

## 2013-12-12 VITALS — BP 118/82 | HR 67 | Temp 98.0°F | Resp 18 | Wt 240.0 lb

## 2013-12-12 DIAGNOSIS — I10 Essential (primary) hypertension: Secondary | ICD-10-CM

## 2013-12-12 DIAGNOSIS — E785 Hyperlipidemia, unspecified: Secondary | ICD-10-CM

## 2013-12-12 DIAGNOSIS — Z79899 Other long term (current) drug therapy: Secondary | ICD-10-CM

## 2013-12-12 DIAGNOSIS — J309 Allergic rhinitis, unspecified: Secondary | ICD-10-CM

## 2013-12-12 LAB — LIPID PANEL
Cholesterol: 157 mg/dL (ref 0–200)
HDL: 42 mg/dL (ref >39)
LDL CALC: 98 mg/dL (ref 0–99)
TRIGLYCERIDES: 83 mg/dL (ref <150)
Total CHOL/HDL Ratio: 3.7 Ratio
VLDL: 17 mg/dL (ref 0–40)

## 2013-12-12 LAB — BASIC METABOLIC PANEL
BUN: 11 mg/dL (ref 6–23)
CHLORIDE: 101 meq/L (ref 96–112)
CO2: 26 meq/L (ref 19–32)
Calcium: 9.4 mg/dL (ref 8.4–10.5)
Creat: 0.85 mg/dL (ref 0.50–1.35)
Glucose, Bld: 106 mg/dL — ABNORMAL HIGH (ref 70–99)
Potassium: 4.3 mEq/L (ref 3.5–5.3)
SODIUM: 138 meq/L (ref 135–145)

## 2013-12-12 LAB — CBC WITH DIFFERENTIAL/PLATELET
BASOS ABS: 0.1 10*3/uL (ref 0.0–0.1)
BASOS PCT: 1 % (ref 0–1)
EOS PCT: 4 % (ref 0–5)
Eosinophils Absolute: 0.2 10*3/uL (ref 0.0–0.7)
HEMATOCRIT: 41.8 % (ref 39.0–52.0)
Hemoglobin: 14.3 g/dL (ref 13.0–17.0)
Lymphocytes Relative: 44 % (ref 12–46)
Lymphs Abs: 2.6 10*3/uL (ref 0.7–4.0)
MCH: 27.2 pg (ref 26.0–34.0)
MCHC: 34.2 g/dL (ref 30.0–36.0)
MCV: 79.5 fL (ref 78.0–100.0)
MONO ABS: 0.6 10*3/uL (ref 0.1–1.0)
Monocytes Relative: 10 % (ref 3–12)
NEUTROS ABS: 2.5 10*3/uL (ref 1.7–7.7)
Neutrophils Relative %: 41 % — ABNORMAL LOW (ref 43–77)
Platelets: 311 10*3/uL (ref 150–400)
RBC: 5.26 MIL/uL (ref 4.22–5.81)
RDW: 14.6 % (ref 11.5–15.5)
WBC: 6 10*3/uL (ref 4.0–10.5)

## 2013-12-12 LAB — POCT RAPID STREP A (OFFICE): RAPID STREP A SCREEN: NEGATIVE

## 2013-12-12 MED ORDER — PRAVASTATIN SODIUM 40 MG PO TABS: 40.0000 mg | ORAL_TABLET | Freq: Every day | ORAL | Status: DC

## 2013-12-12 MED ORDER — LOSARTAN POTASSIUM-HCTZ 100-25 MG PO TABS
1.0000 | ORAL_TABLET | Freq: Every day | ORAL | Status: DC
Start: 2013-12-12 — End: 2014-03-13

## 2013-12-12 NOTE — Patient Instructions (Signed)

## 2013-12-12 NOTE — Progress Notes (Addendum)
   Subjective:    Patient ID: Donald [name], male    DOB: 1970/08/17, 44 y.o.   MRN: [id number] This chart was scribed for Arlyss Queen, MD by Vernell Barrier, Medical Scribe. The patient was seen in room 14. This patient's care was started at 1:21 PM.  HPI HPI Comments: [name] is a 43 y.o. male who presents to the [facility] for medication refill for Lisinopril and Pravastatin. Does not report any negative effects from the medication. Currently reports some upper respiratory sxs including congestion, sneezing, and scratchy throat. Constantly clearing throat. States these sxs started 2 days ago. Also reports left sided epistaxis 2 days ago. Last taken Lisinopril last night. Does not report prior allergy issue. Currently fasting. Otherwise healthy. Works as a Water quality scientist. States his work includes smelling a lot of perfumes and sometimes same sxs presents when he does that at work. Denies chest pain, abdominal pain, cough, watery eyes, or SOB.  Review of Systems  HENT: Positive for congestion, sneezing and sore throat.   Eyes: Negative for discharge.  Respiratory: Negative for cough and shortness of breath.   Cardiovascular: Negative for chest pain.  Gastrointestinal: Negative for abdominal pain.   Objective:   Physical Exam CONSTITUTIONAL: Well developed/well nourished HEAD: Normocephalic/atraumatic EYES: EOMI/PERRL ENMT: Mucous membranes moist NECK: supple no meningeal signs SPINE:entire spine nontender CV: S1/S2 noted, no murmurs/rubs/gallops noted LUNGS: Lungs are clear to auscultation bilaterally, no apparent distress ABDOMEN: soft, nontender, no rebound or guarding GU:no cva tenderness NEURO: Pt is awake/alert, moves all extremitiesx4 EXTREMITIES: pulses normal, full ROM SKIN: warm, color normal PSYCH: no abnormalities of mood noted  Results for orders placed in visit on 12/12/13  POCT RAPID STREP A (OFFICE)      Result Value Ref Range   Rapid Strep A Screen Negative  Negative   Meds ordered this encounter  Medications  . losartan-hydrochlorothiazide (HYZAAR) 100-25 MG per tablet    Sig: Take 1 tablet by mouth daily.    Dispense:  90 tablet    Refill:  3  . pravastatin (PRAVACHOL) 40 MG tablet    Sig: Take 1 tablet (40 mg total) by mouth daily.    Dispense:  90 tablet    Refill:  3    Assessment & Plan:  I changed his Lisinopril HCTZ to Hyzaar due to cough induced by Lisinopril. I will prescribe him some medications for allergies. He sounds like he has had recurrent episodes of allergy problems. Just to be safe I did change him to an ARB. I also instructed him to take Zyrtec and Flonase. He is exposed to a lot of perfumes at work and the seem to irritate him.   I personally performed the services described in this documentation, which was scribed in my presence. The recorded information has been reviewed and is accurate.

## 2013-12-14 LAB — CULTURE, GROUP A STREP: Organism ID, Bacteria: NORMAL

## 2013-12-18 ENCOUNTER — Ambulatory Visit (INDEPENDENT_AMBULATORY_CARE_PROVIDER_SITE_OTHER): Payer: 59 | Admitting: Family Medicine

## 2013-12-18 VITALS — BP 120/82 | HR 70 | Temp 98.4°F | Resp 16 | Ht 73.0 in | Wt 233.4 lb

## 2013-12-18 DIAGNOSIS — R7309 Other abnormal glucose: Secondary | ICD-10-CM

## 2013-12-18 DIAGNOSIS — R739 Hyperglycemia, unspecified: Secondary | ICD-10-CM

## 2013-12-18 DIAGNOSIS — Z131 Encounter for screening for diabetes mellitus: Secondary | ICD-10-CM

## 2013-12-18 LAB — POCT GLYCOSYLATED HEMOGLOBIN (HGB A1C): Hemoglobin A1C: 5.8

## 2013-12-18 NOTE — Progress Notes (Signed)
Chief Complaint:  Chief Complaint  Patient presents with  . Blood Sugar Problem    Would like glucose checked, checks it at home- elevated recently. No h/o diabetes, not other symptoms. Pt is fasting.    HPI: Donald [name] is a 43 y.o. male who is here for diabetes screening Would like to get diabetes check since he took his  fasting glucose this morning and it  was 128 this morning.  He is going to [place] with his young children for 2 months and is worried. He has never had high sugars before.  On his last OV with me he has tonsillitis, he was tired of getting throat infectiosn and swollen tonsil every couple of months so wanteed to see a specialist to get his tonsils removed , he has seen ENT and they recommended to monitor for the next 6 months and see if he has recurrent tonsil irritation/flareups/tonsillitis His cough is minimally improved after switch from ACEI to ARB  Past Medical History  Diagnosis Date  . Hypertension   . Hypercholesterolemia   . Right bundle branch block   . PARASOMNIA   . NEPHROLITHIASIS, HX OF   . Lumbago   . G E R D   . Dysuria   . DEPRESSION, MILD    Past Surgical History  Procedure Laterality Date  . Hand surgery     History   Social History  . Marital Status: Single    Spouse Name: N/A    Number of Children: N/A  . Years of Education: N/A   Social History Main Topics  . Smoking status: Never Smoker   . Smokeless tobacco: None  . Alcohol Use: No  . Drug Use: No  . Sexual Activity: None   Other Topics Concern  . None   Social History Narrative  . None   History reviewed. No pertinent family history. Allergies  Allergen Reactions  . Ace Inhibitors Cough    Lisinopril caused cough   Prior to Admission medications   Medication Sig Start Date End Date Taking? Authorizing Provider  aspirin 325 MG tablet Take 162 mg by mouth daily.   Yes Historical Provider, MD  losartan-hydrochlorothiazide (HYZAAR) 100-25 MG per tablet  Take 1 tablet by mouth daily. 12/12/13  Yes Darlyne Russian, MD  pravastatin (PRAVACHOL) 40 MG tablet Take 1 tablet (40 mg total) by mouth daily. 12/12/13  Yes Darlyne Russian, MD     ROS: The patient denies fevers, chills, night sweats, unintentional weight loss, chest pain, palpitations, wheezing, dyspnea on exertion, nausea, vomiting, abdominal pain, dysuria, hematuria, melena, numbness, weakness, or tingling.   All other systems have been reviewed and were otherwise negative with the exception of those mentioned in the HPI and as above.    PHYSICAL EXAM: Filed Vitals:   12/18/13 1804  BP: 120/82  Pulse: 70  Temp: 98.4 F (36.9 C)  Resp: 16   Filed Vitals:   12/18/13 1804  Height: 6\' 1"  (1.854 m)  Weight: 233 lb 6.4 oz (105.87 kg)   Body mass index is 30.8 kg/(m^2).  General: Alert, no acute distress HEENT:  Normocephalic, atraumatic, oropharynx patent. EOMI, PERRLA, + large right tonsils, + debris in right tonsillar crypts.  Cardiovascular:  Regular rate and rhythm, no rubs murmurs or gallops.  No Carotid bruits, radial pulse intact. No pedal edema.  Respiratory: Clear to auscultation bilaterally.  No wheezes, rales, or rhonchi.  No cyanosis, no use of accessory musculature GI: No organomegaly,  abdomen is soft and non-tender, positive bowel sounds.  No masses. Skin: No rashes. Neurologic: Facial musculature symmetric. Psychiatric: Patient is appropriate throughout our interaction. Lymphatic: No cervical lymphadenopathy Musculoskeletal: Gait intact.   LABS: Results for orders placed in visit on 12/18/13  POCT GLYCOSYLATED HEMOGLOBIN (HGB A1C)      Result Value Ref Range   Hemoglobin A1C 5.8       EKG/XRAY:   Primary read interpreted by Dr. Marin Comment at [facility].   ASSESSMENT/PLAN: Encounter Diagnoses  Name Primary?  . Hyperglycemia Yes  . Screening for diabetes mellitus    No diabetes He is doing well. Declines any malaria ppx for trip to [place] F/u prn  Gross sideeffects,  risk and benefits, and alternatives of medications d/w patient. Patient is aware that all medications have potential sideeffects and we are unable to predict every sideeffect or drug-drug interaction that may occur.  Shavone Nevers, [place], DO 12/18/2013 9:13 PM

## 2013-12-18 NOTE — Telephone Encounter (Signed)
Encounter complete. 

## 2014-03-13 ENCOUNTER — Ambulatory Visit (INDEPENDENT_AMBULATORY_CARE_PROVIDER_SITE_OTHER): Payer: 59 | Admitting: Family Medicine

## 2014-03-13 VITALS — BP 132/78 | HR 74 | Temp 98.1°F | Resp 18 | Ht 73.25 in | Wt 251.8 lb

## 2014-03-13 DIAGNOSIS — E785 Hyperlipidemia, unspecified: Secondary | ICD-10-CM

## 2014-03-13 DIAGNOSIS — H6991 Unspecified Eustachian tube disorder, right ear: Secondary | ICD-10-CM

## 2014-03-13 DIAGNOSIS — I10 Essential (primary) hypertension: Secondary | ICD-10-CM

## 2014-03-13 MED ORDER — LOSARTAN POTASSIUM-HCTZ 100-25 MG PO TABS: 1.0000 | ORAL_TABLET | Freq: Every day | ORAL | Status: DC

## 2014-03-13 MED ORDER — PRAVASTATIN SODIUM 40 MG PO TABS: 40.0000 mg | ORAL_TABLET | Freq: Every day | ORAL | Status: DC

## 2014-03-13 MED ORDER — FLUTICASONE PROPIONATE 50 MCG/ACT NA SUSP: 2.0000 | Freq: Every day | NASAL | Status: DC

## 2014-03-13 NOTE — Progress Notes (Signed)
   Subjective:    Patient ID: Donald [name], male    DOB: [birth year], 43 y.o.   MRN: [id number]  HPI This is a very pleasant 43 yo who flew from [place] 2 days ago and has had pain in his right ear since landing. He has never had problems with his ears in the past. He feels as though he can not unstop his ear from the altitude changes even though he has tried chewing and various maneuvers. He does not have drainage from his ear.  He would also like a refill of his antihypertensive medications and cholesterol lowering medication. He denies side effects. He had BMP/lipid panel 3 months ago.    Review of Systems No fever, no runny nose, no sore throat, no cough. No chest pain, no SOB.    Objective:   Physical Exam  Vitals reviewed. Constitutional: He is oriented to person, place, and time. He appears well-developed and well-nourished.  HENT:  Head: Normocephalic and atraumatic.  Right Ear: Hearing, external ear and ear canal normal. No drainage, swelling or tenderness. No mastoid tenderness. Tympanic membrane is not perforated. A middle ear effusion is present.  Left Ear: Hearing, tympanic membrane, external ear and ear canal normal.  Nose: Nose normal.  Mouth/Throat: Oropharynx is clear and moist.  Small bubbles noted behind right TM.  Eyes: Conjunctivae are normal.  Neck: Normal range of motion. Neck supple.  Cardiovascular: Normal rate and regular rhythm.   Pulmonary/Chest: Effort normal and breath sounds normal.  Musculoskeletal: Normal range of motion.  Neurological: He is alert and oriented to person, place, and time.  Skin: Skin is warm and dry.  Psychiatric: He has a normal mood and affect. His behavior is normal. Judgment and thought content normal.      Assessment & Plan:  1. Eustachian tube disorder, right -Provided written and verbal information regarding diagnosis and treatment. - fluticasone (FLONASE) 50 MCG/ACT nasal spray; Place 2 sprays into both  nostrils daily.  Dispense: 16 g; Refill: 6  2. Essential hypertension - losartan-hydrochlorothiazide (HYZAAR) 100-25 MG per tablet; Take 1 tablet by mouth daily.  Dispense: 90 tablet; Refill: 1  3. Hyperlipidemia - pravastatin (PRAVACHOL) 40 MG tablet; Take 1 tablet (40 mg total) by mouth daily.  Dispense: 90 tablet; Refill: 1  -Offered patient an appointment at 104 for 3 month follow up, patient declined stating that he prefers to walk in.  Elby Beck, FNP-BC  [facility]  03/15/2014 8:44 PM 22

## 2014-03-13 NOTE — Patient Instructions (Signed)
Barotitis Media Barotitis media is inflammation of your middle ear. This occurs when the auditory tube (eustachian tube) leading from the back of your nose (nasopharynx) to your eardrum is blocked. This blockage may result from a cold, environmental allergies, or an upper respiratory infection. Unresolved barotitis media may lead to damage or hearing loss (barotrauma), which may become permanent. HOME CARE INSTRUCTIONS   Use medicines as recommended by your health care provider. Over-the-counter medicines will help unblock the canal and can help during times of air travel.  Do not put anything into your ears to clean or unplug them. Eardrops will not be helpful.  Do not swim, dive, or fly until your health care provider says it is all right to do so. If these activities are necessary, chewing gum with frequent, forceful swallowing may help. It is also helpful to hold your nose and gently blow to pop your ears for equalizing pressure changes. This forces air into the eustachian tube.  Only take over-the-counter or prescription medicines for pain, discomfort, or fever as directed by your health care provider.  A decongestant may be helpful in decongesting the middle ear and make pressure equalization easier. SEEK MEDICAL CARE IF:  You experience a serious form of dizziness in which you feel as if the room is spinning and you feel nauseated (vertigo).  Your symptoms only involve one ear. SEEK IMMEDIATE MEDICAL CARE IF:   You develop a severe headache, dizziness, or severe ear pain.  You have bloody or pus-like drainage from your ears.  You develop a fever.  Your problems do not improve or become worse. MAKE SURE YOU:   Understand these instructions.  Will watch your condition.  Will get help right away if you are not doing well or get worse. Document Released: 05/19/2000 Document Revised: 03/12/2013 Document Reviewed: 12/17/2012 [employer] Patient Information 2015 [employer]. This  information is not intended to replace advice given to you by your health care provider. Make sure you discuss any questions you have with your health care provider.  

## 2014-05-13 ENCOUNTER — Encounter: Payer: Self-pay | Admitting: *Deleted

## 2014-05-13 ENCOUNTER — Encounter: Payer: Self-pay | Admitting: Family Medicine

## 2014-05-13 ENCOUNTER — Ambulatory Visit (INDEPENDENT_AMBULATORY_CARE_PROVIDER_SITE_OTHER): Payer: 59 | Admitting: Family Medicine

## 2014-05-13 VITALS — BP 154/78 | HR 100 | Temp 98.0°F | Resp 18 | Ht 73.0 in | Wt 254.2 lb

## 2014-05-13 DIAGNOSIS — K0510 Chronic gingivitis, plaque induced: Secondary | ICD-10-CM

## 2014-05-13 DIAGNOSIS — J0100 Acute maxillary sinusitis, unspecified: Secondary | ICD-10-CM

## 2014-05-13 DIAGNOSIS — E785 Hyperlipidemia, unspecified: Secondary | ICD-10-CM

## 2014-05-13 LAB — COMPREHENSIVE METABOLIC PANEL
ALT: 14 U/L (ref 0–53)
AST: 16 U/L (ref 0–37)
Albumin: 4.2 g/dL (ref 3.5–5.2)
Alkaline Phosphatase: 61 U/L (ref 39–117)
BUN: 15 mg/dL (ref 6–23)
CO2: 24 mEq/L (ref 19–32)
Calcium: 9.4 mg/dL (ref 8.4–10.5)
Chloride: 105 mEq/L (ref 96–112)
Creat: 0.85 mg/dL (ref 0.50–1.35)
Glucose, Bld: 121 mg/dL — ABNORMAL HIGH (ref 70–99)
Potassium: 4.2 mEq/L (ref 3.5–5.3)
Sodium: 137 mEq/L (ref 135–145)
Total Bilirubin: 0.5 mg/dL (ref 0.2–1.2)
Total Protein: 7.5 g/dL (ref 6.0–8.3)

## 2014-05-13 LAB — POCT CBC
Granulocyte percent: 46.7 %G (ref 37–80)
HCT, POC: 43.8 % (ref 43.5–53.7)
Hemoglobin: 14.1 g/dL (ref 14.1–18.1)
Lymph, poc: 2.5 (ref 0.6–3.4)
MCH, POC: 27.3 pg (ref 27–31.2)
MCHC: 32.1 g/dL (ref 31.8–35.4)
MCV: 84.9 fL (ref 80–97)
MID (cbc): 0.3 (ref 0–0.9)
MPV: 7.7 fL (ref 0–99.8)
POC Granulocyte: 2.4 (ref 2–6.9)
POC LYMPH PERCENT: 47.5 %L (ref 10–50)
POC MID %: 5.8 %M (ref 0–12)
Platelet Count, POC: 335 10*3/uL (ref 142–424)
RBC: 5.16 M/uL (ref 4.69–6.13)
RDW, POC: 15.3 %
WBC: 5.2 10*3/uL (ref 4.6–10.2)

## 2014-05-13 LAB — LIPID PANEL
Cholesterol: 152 mg/dL (ref 0–200)
HDL: 34 mg/dL — ABNORMAL LOW (ref >39)
LDL Cholesterol: 77 mg/dL (ref 0–99)
Total CHOL/HDL Ratio: 4.5 Ratio
Triglycerides: 206 mg/dL — ABNORMAL HIGH (ref <150)
VLDL: 41 mg/dL — ABNORMAL HIGH (ref 0–40)

## 2014-05-13 MED ORDER — AMOXICILLIN 875 MG PO TABS: 875.0000 mg | ORAL_TABLET | Freq: Two times a day (BID) | ORAL | Status: DC

## 2014-05-13 NOTE — Progress Notes (Signed)
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 He's had no changes in his vision. He's had no head trauma. He's had no trouble with his hearing and his neck is supple.  Patient denies fever. Patient does have some chest pressure and cough. This is intermittent and not associated with activity  He says the headache comes and goes and is relieved by Tylenol  Objective: Patient is alert cooperative and points to both areas of his lower occiput to indicate where the pain is.  HEENT is unremarkable with exception of swollen nasal passages and mucopurulent discharge. Chest: Clear Heart: Regular no murmur Extremity: No edema Neck: Supple no adenopathy  Assessment: This chart was scribed in my presence and reviewed by me personally.    ICD-9-CM ICD-10-CM   1. Hyperlipidemia 272.4 E78.5 Comprehensive metabolic panel     Lipid panel     POCT CBC  2. Acute maxillary sinusitis, recurrence not specified 461.0 J01.00 POCT CBC     amoxicillin (AMOXIL) 875 MG tablet  3. Gingivitis 523.10 K05.10   Hyperlipidemia - Plan: Comprehensive metabolic panel, Lipid panel, POCT CBC  Acute maxillary sinusitis, recurrence not specified - Plan: POCT CBC, amoxicillin (AMOXIL) 875 MG tablet  Gingivitis     Signed, Robyn Haber, MD

## 2014-05-13 NOTE — Patient Instructions (Signed)
You need to floss your teeth in addition to brushing them every day   Sinusitis Sinusitis is redness, soreness, and inflammation of the paranasal sinuses. Paranasal sinuses are air pockets within the bones of your face (beneath the eyes, the middle of the forehead, or above the eyes). In healthy paranasal sinuses, mucus is able to drain out, and air is able to circulate through them by way of your nose. However, when your paranasal sinuses are inflamed, mucus and air can become trapped. This can allow bacteria and other germs to grow and cause infection. Sinusitis can develop quickly and last only a short time (acute) or continue over a long period (chronic). Sinusitis that lasts for more than 12 weeks is considered chronic.  CAUSES  Causes of sinusitis include:  Allergies.  Structural abnormalities, such as displacement of the cartilage that separates your nostrils (deviated septum), which can decrease the air flow through your nose and sinuses and affect sinus drainage.  Functional abnormalities, such as when the small hairs (cilia) that line your sinuses and help remove mucus do not work properly or are not present. SIGNS AND SYMPTOMS  Symptoms of acute and chronic sinusitis are the same. The primary symptoms are pain and pressure around the affected sinuses. Other symptoms include:  Upper toothache.  Earache.  Headache.  Bad breath.  Decreased sense of smell and taste.  A cough, which worsens when you are lying flat.  Fatigue.  Fever.  Thick drainage from your nose, which often is green and may contain pus (purulent).  Swelling and warmth over the affected sinuses. DIAGNOSIS  Your health care provider will perform a physical exam. During the exam, your health care provider may:  Look in your nose for signs of abnormal growths in your nostrils (nasal polyps).  Tap over the affected sinus to check for signs of infection.  View the inside of your sinuses (endoscopy) using  an imaging device that has a light attached (endoscope). If your health care provider suspects that you have chronic sinusitis, one or more of the following tests may be recommended:  Allergy tests.  Nasal culture. A sample of mucus is taken from your nose, sent to a lab, and screened for bacteria.  Nasal cytology. A sample of mucus is taken from your nose and examined by your health care provider to determine if your sinusitis is related to an allergy. TREATMENT  Most cases of acute sinusitis are related to a viral infection and will resolve on their own within 10 days. Sometimes medicines are prescribed to help relieve symptoms (pain medicine, decongestants, nasal steroid sprays, or saline sprays).  However, for sinusitis related to a bacterial infection, your health care provider will prescribe antibiotic medicines. These are medicines that will help kill the bacteria causing the infection.  Rarely, sinusitis is caused by a fungal infection. In theses cases, your health care provider will prescribe antifungal medicine. For some cases of chronic sinusitis, surgery is needed. Generally, these are cases in which sinusitis recurs more than 3 times per year, despite other treatments. HOME CARE INSTRUCTIONS   Drink plenty of water. Water helps thin the mucus so your sinuses can drain more easily.  Use a humidifier.  Inhale steam 3 to 4 times a day (for example, sit in the bathroom with the shower running).  Apply a warm, moist washcloth to your face 3 to 4 times a day, or as directed by your health care provider.  Use saline nasal sprays to help moisten and  clean your sinuses.  Take medicines only as directed by your health care provider.  If you were prescribed either an antibiotic or antifungal medicine, finish it all even if you start to feel better. SEEK IMMEDIATE MEDICAL CARE IF:  You have increasing pain or severe headaches.  You have nausea, vomiting, or drowsiness.  You have  swelling around your face.  You have vision problems.  You have a stiff neck.  You have difficulty breathing. MAKE SURE YOU:   Understand these instructions.  Will watch your condition.  Will get help right away if you are not doing well or get worse. Document Released: 05/22/2005 Document Revised: 10/06/2013 Document Reviewed: 06/06/2011 [facility] Patient Information 2015 [place], [place]. This information is not intended to replace advice given to you by your health care provider. Make sure you discuss any questions you have with your health care provider.

## 2014-09-13 ENCOUNTER — Ambulatory Visit (INDEPENDENT_AMBULATORY_CARE_PROVIDER_SITE_OTHER): Payer: 59 | Admitting: Family Medicine

## 2014-09-13 VITALS — BP 144/78 | HR 84 | Temp 98.1°F | Resp 20 | Ht 73.0 in | Wt 253.2 lb

## 2014-09-13 DIAGNOSIS — E785 Hyperlipidemia, unspecified: Secondary | ICD-10-CM

## 2014-09-13 DIAGNOSIS — E041 Nontoxic single thyroid nodule: Secondary | ICD-10-CM

## 2014-09-13 DIAGNOSIS — I10 Essential (primary) hypertension: Secondary | ICD-10-CM

## 2014-09-13 DIAGNOSIS — R0981 Nasal congestion: Secondary | ICD-10-CM

## 2014-09-13 DIAGNOSIS — J309 Allergic rhinitis, unspecified: Secondary | ICD-10-CM

## 2014-09-13 DIAGNOSIS — E8881 Metabolic syndrome: Secondary | ICD-10-CM

## 2014-09-13 DIAGNOSIS — R7309 Other abnormal glucose: Secondary | ICD-10-CM

## 2014-09-13 DIAGNOSIS — R7303 Prediabetes: Secondary | ICD-10-CM

## 2014-09-13 DIAGNOSIS — R351 Nocturia: Secondary | ICD-10-CM

## 2014-09-13 LAB — POCT UA - MICROSCOPIC ONLY
BACTERIA, U MICROSCOPIC: NEGATIVE
CASTS, UR, LPF, POC: NEGATIVE
Crystals, Ur, HPF, POC: NEGATIVE
RBC, urine, microscopic: NEGATIVE
WBC, UR, HPF, POC: NEGATIVE
YEAST UA: NEGATIVE

## 2014-09-13 LAB — POCT CBC
GRANULOCYTE PERCENT: 40.7 % (ref 37–80)
HEMATOCRIT: 43.7 % (ref 43.5–53.7)
HEMOGLOBIN: 14.3 g/dL (ref 14.1–18.1)
LYMPH, POC: 3.6 — AB (ref 0.6–3.4)
MCH: 26.8 pg — AB (ref 27–31.2)
MCHC: 32.6 g/dL (ref 31.8–35.4)
MCV: 82.1 fL (ref 80–97)
MID (cbc): 0.6 (ref 0–0.9)
MPV: 7.4 fL (ref 0–99.8)
PLATELET COUNT, POC: 340 10*3/uL (ref 142–424)
POC Granulocyte: 2.8 (ref 2–6.9)
POC LYMPH %: 51 % — AB (ref 10–50)
POC MID %: 8.3 % (ref 0–12)
RBC: 5.31 M/uL (ref 4.69–6.13)
RDW, POC: 15 %
WBC: 7 10*3/uL (ref 4.6–10.2)

## 2014-09-13 LAB — POCT URINALYSIS DIPSTICK
Bilirubin, UA: NEGATIVE
Glucose, UA: NEGATIVE
KETONES UA: NEGATIVE
LEUKOCYTES UA: NEGATIVE
Nitrite, UA: NEGATIVE
Protein, UA: NEGATIVE
Spec Grav, UA: 1.02
Urobilinogen, UA: 1
pH, UA: 7

## 2014-09-13 LAB — POCT GLYCOSYLATED HEMOGLOBIN (HGB A1C): HEMOGLOBIN A1C: 6

## 2014-09-13 LAB — TSH: TSH: 1.102 u[IU]/mL (ref 0.350–4.500)

## 2014-09-13 LAB — GLUCOSE, POCT (MANUAL RESULT ENTRY): POC Glucose: 108 mg/dl — AB (ref 70–99)

## 2014-09-13 MED ORDER — FLUTICASONE PROPIONATE 50 MCG/ACT NA SUSP: 2.0000 | Freq: Every day | NASAL | Status: DC

## 2014-09-13 MED ORDER — CETIRIZINE HCL 10 MG PO TABS: 10.0000 mg | ORAL_TABLET | Freq: Every day | ORAL | Status: DC

## 2014-09-13 MED ORDER — AMLODIPINE BESYLATE 5 MG PO TABS: 5.0000 mg | ORAL_TABLET | Freq: Every day | ORAL | Status: DC

## 2014-09-13 NOTE — Progress Notes (Signed)
Subjective:   This chart was scribed for Dr. Delman Cheadle, MD by Erling Conte, ED Scribe. This patient was seen in Room 4 and the patient's care was started at 2:09 PM.   Patient ID: Donald [name], male    DOB: 1970-06-17, 44 y.o.   MRN: [id number]  Chief Complaint  Patient presents with  . Nasal Congestion  . Sinus Drainage  . Hypertension    HPI HPI Comments: Donald [name] is a 44 y.o. male who presents to the [facility] complaining of gradually worsening, intermittent, nasal congestion. He is having associated neck pain, HA, sinus pressure and pain, sore throat, cough, and otalgia. Pt has been here several times for same concerns. At his last visit he was put on amoxicillin for sinusitis. Prior to that for similar symptoms he was put on Flonase. He regularly walks into 102 for routine medical care and declines to schedule an appt next door at [facility] appt center. Pt has followed up with ENT for recurrent tonsilitis but they have declined to remove his tonsils.  Per prior notes he has had minimal improvement in cough when switched from lisinopril to losartan. Pt denies taking any medications for his symptoms. Pt denies h/o seasonal allergies. He denies any fever, chills or difficulty swallowing.   Pt is also here for a followup of his HTN and medication refill. He had cholesterol check at last visit 4 months ago that was normal. He was prediabetic at last visit with hemoglobin A1C of 5.8. {Pt states he takes his meds every day and reports no side effects. Pt notes he does wake up to urinate 3-4x in the middle of the night but believes it may be due to the fact he takes his BP medication at night. He checks his BP at home and states it has been running around 140/94. He notes he has been more thirsty at night. He denies any fatigue.    Past Medical History  Diagnosis Date  . Hyperlipidemia     NOT TAKING MEDICATION  . Hypertension   . Hypercholesterolemia   .  Right bundle branch block   . PARASOMNIA   . NEPHROLITHIASIS, HX OF   . Lumbago   . G E R D   . Dysuria   . DEPRESSION, MILD    Current Outpatient Prescriptions on File Prior to Visit  Medication Sig Dispense Refill  . aspirin 325 MG tablet Take 325 mg by mouth daily.    Marland Kitchen losartan-hydrochlorothiazide (HYZAAR) 100-25 MG per tablet Take 1 tablet by mouth daily. 90 tablet 1  . pravastatin (PRAVACHOL) 40 MG tablet Take 1 tablet (40 mg total) by mouth daily. 90 tablet 1   No current facility-administered medications on file prior to visit.   Allergies  Allergen Reactions  . Ace Inhibitors Cough    Lisinopril caused cough    Review of Systems  Constitutional: Negative for fever, chills and fatigue.  HENT: Positive for congestion, ear pain, sinus pressure and sore throat. Negative for trouble swallowing.   Respiratory: Positive for cough.   Endocrine: Positive for polydipsia (at night) and polyuria (at night; due to taking BP meds at night).  Musculoskeletal: Positive for neck pain.  Neurological: Positive for headaches.    Triage Vitals: BP 144/78 mmHg  Pulse 84  Temp(Src) 98.1 F (36.7 C) (Oral)  Resp 20  Ht 6\' 1"  (1.854 m)  Wt 253 lb 4 oz (114.873 kg)  BMI 33.42 kg/m2  SpO2 99%  Objective:   Physical Exam  Constitutional: He is oriented to person, place, and time. He appears well-developed and well-nourished. No distress.  Feels diaphoretic  HENT:  Head: Normocephalic and atraumatic.  Right Ear: Tympanic membrane normal.  Left Ear: Tympanic membrane normal.  mild erythema of nasal mucosa. Large 3+ tonsils at baseline w/o erythema or exudate  Eyes: Conjunctivae and EOM are normal.  Neck: Neck supple. No tracheal deviation present.  Enlarged left lobe of thyroid  Cardiovascular: Normal rate, regular rhythm, S1 normal, S2 normal and normal heart sounds.   No murmur heard. Pulmonary/Chest: Effort normal. No respiratory distress.  Musculoskeletal: Normal range of  motion.  Neurological: He is alert and oriented to person, place, and time.  Skin: Skin is warm and dry.  Psychiatric: He has a normal mood and affect. His behavior is normal.  Nursing note and vitals reviewed.   Results for orders placed or performed in visit on 09/13/14  POCT glucose (manual entry)  Result Value Ref Range   POC Glucose 108 (A) 70 - 99 mg/dl  POCT glycosylated hemoglobin (Hb A1C)  Result Value Ref Range   Hemoglobin A1C 6   POCT CBC  Result Value Ref Range   WBC 7.0 4.6 - 10.2 K/uL   Lymph, poc 3.6 (A) 0.6 - 3.4   POC LYMPH PERCENT 51.0 (A) 10 - 50 %L   MID (cbc) 0.6 0 - 0.9   POC MID % 8.3 0 - 12 %M   POC Granulocyte 2.8 2 - 6.9   Granulocyte percent 40.7 37 - 80 %G   RBC 5.31 4.69 - 6.13 M/uL   Hemoglobin 14.3 14.1 - 18.1 g/dL   HCT, POC 43.7 43.5 - 53.7 %   MCV 82.1 80 - 97 fL   MCH, POC 26.8 (A) 27 - 31.2 pg   MCHC 32.6 31.8 - 35.4 g/dL   RDW, POC 15.0 %   Platelet Count, POC 340 142 - 424 K/uL   MPV 7.4 0 - 99.8 fL  POCT UA - Microscopic Only  Result Value Ref Range   WBC, Ur, HPF, POC neg    RBC, urine, microscopic neg    Bacteria, U Microscopic neg    Mucus, UA small    Epithelial cells, urine per micros 0-2    Crystals, Ur, HPF, POC neg    Casts, Ur, LPF, POC neg    Yeast, UA neg   POCT urinalysis dipstick  Result Value Ref Range   Color, UA yellow    Clarity, UA clear    Glucose, UA negative    Bilirubin, UA negative    Ketones, UA negative    Spec Grav, UA 1.020    Blood, UA trace lysed    pH, UA 7.0    Protein, UA negative    Urobilinogen, UA 1.0    Nitrite, UA negative    Leukocytes, UA Negative        Assessment & Plan:   1. Hyperlipidemia   2. Essential hypertension   3. Nocturia more than twice per night - switch bp med to am and no fluids 2-3hrs before sleep  4. Sinus congestion   5. Allergic rhinitis, unspecified allergic rhinitis type   6. Left thyroid nodule   7. Prediabetes   8. Metabolic syndrome     Orders  Placed This Encounter  Procedures  . TSH  . PSA  . POCT glucose (manual entry)  . POCT glycosylated hemoglobin (Hb A1C)  . POCT CBC  .  POCT UA - Microscopic Only  . POCT urinalysis dipstick    Meds ordered this encounter  Medications  . amLODipine (NORVASC) 5 MG tablet    Sig: Take 1 tablet (5 mg total) by mouth daily.    Dispense:  90 tablet    Refill:  1  . fluticasone (FLONASE) 50 MCG/ACT nasal spray    Sig: Place 2 sprays into both nostrils at bedtime.    Dispense:  16 g    Refill:  5  . cetirizine (ZYRTEC) 10 MG tablet    Sig: Take 1 tablet (10 mg total) by mouth at bedtime.    Dispense:  30 tablet    Refill:  11    I personally performed the services described in this documentation, which was scribed in my presence. The recorded information has been reviewed and considered, and addended by me as needed.  Delman Cheadle, MD MPH

## 2014-09-13 NOTE — Patient Instructions (Addendum)
Start taking your blood pressure pills in the morning.  Do not drink fluids 2-3 hours before bed.  Managing Your High Blood Pressure Blood pressure is a measurement of how forceful your blood is pressing against the walls of the arteries. Arteries are muscular tubes within the circulatory system. Blood pressure does not stay the same. Blood pressure rises when you are active, excited, or nervous; and it lowers during sleep and relaxation. If the numbers measuring your blood pressure stay above normal most of the time, you are at risk for health problems. High blood pressure (hypertension) is a long-term (chronic) condition in which blood pressure is elevated. A blood pressure reading is recorded as two numbers, such as 120 over 80 (or 120/80). The first, higher number is called the systolic pressure. It is a measure of the pressure in your arteries as the heart beats. The second, lower number is called the diastolic pressure. It is a measure of the pressure in your arteries as the heart relaxes between beats.  Keeping your blood pressure in a normal range is important to your overall health and prevention of health problems, such as heart disease and stroke. When your blood pressure is uncontrolled, your heart has to work harder than normal. High blood pressure is a very common condition in adults because blood pressure tends to rise with age. Men and women are equally likely to have hypertension but at different times in life. Before age 29, men are more likely to have hypertension. After 46 years of age, women are more likely to have it. Hypertension is especially common in African Americans. This condition often has no signs or symptoms. The cause of the condition is usually not known. Your caregiver can help you come up with a plan to keep your blood pressure in a normal, healthy range. BLOOD PRESSURE STAGES Blood pressure is classified into four stages: normal, prehypertension, stage 1, and stage 2. Your  blood pressure reading will be used to determine what type of treatment, if any, is necessary. Appropriate treatment options are tied to these four stages:  Normal  Systolic pressure (mm Hg): below 120.  Diastolic pressure (mm Hg): below 80. Prehypertension  Systolic pressure (mm Hg): 120 to 139.  Diastolic pressure (mm Hg): 80 to 89. Stage1  Systolic pressure (mm Hg): 140 to 159.  Diastolic pressure (mm Hg): 90 to 99. Stage2  Systolic pressure (mm Hg): 160 or above.  Diastolic pressure (mm Hg): 100 or above. RISKS RELATED TO HIGH BLOOD PRESSURE Managing your blood pressure is an important responsibility. Uncontrolled high blood pressure can lead to:  A heart attack.  A stroke.  A weakened blood vessel (aneurysm).  Heart failure.  Kidney damage.  Eye damage.  Metabolic syndrome.  Memory and concentration problems. HOW TO MANAGE YOUR BLOOD PRESSURE Blood pressure can be managed effectively with lifestyle changes and medicines (if needed). Your caregiver will help you come up with a plan to bring your blood pressure within a normal range. Your plan should include the following: Education  Read all information provided by your caregivers about how to control blood pressure.  Educate yourself on the latest guidelines and treatment recommendations. New research is always being done to further define the risks and treatments for high blood pressure. Lifestylechanges  Control your weight.  Avoid smoking.  Stay physically active.  Reduce the amount of salt in your diet.  Reduce stress.  Control any chronic conditions, such as high cholesterol or diabetes.  Reduce your alcohol  intake. Medicines  Several medicines (antihypertensive medicines) are available, if needed, to bring blood pressure within a normal range. Communication  Review all the medicines you take with your caregiver because there may be side effects or interactions.  Talk with your  caregiver about your diet, exercise habits, and other lifestyle factors that may be contributing to high blood pressure.  See your caregiver regularly. Your caregiver can help you create and adjust your plan for managing high blood pressure. RECOMMENDATIONS FOR TREATMENT AND FOLLOW-UP  The following recommendations are based on current guidelines for managing high blood pressure in nonpregnant adults. Use these recommendations to identify the proper follow-up period or treatment option based on your blood pressure reading. You can discuss these options with your caregiver.  Systolic pressure of 272 to 536 or diastolic pressure of 80 to 89: Follow up with your caregiver as directed.  Systolic pressure of 644 to 034 or diastolic pressure of 90 to 100: Follow up with your caregiver within 2 months.  Systolic pressure above 742 or diastolic pressure above 595: Follow up with your caregiver within 1 month.  Systolic pressure above 638 or diastolic pressure above 756: Consider antihypertensive therapy; follow up with your caregiver within 1 week.  Systolic pressure above 433 or diastolic pressure above 295: Begin antihypertensive therapy; follow up with your caregiver within 1 week. Document Released: 02/14/2012 Document Reviewed: 02/14/2012 [facility] Patient Information 2015 [place]. This information is not intended to replace advice given to you by your health care provider. Make sure you discuss any questions you have with your health care provider.   Hay Fever Hay fever is an allergic reaction to particles in the air. It cannot be passed from person to person. It cannot be cured, but it can be controlled. CAUSES  Hay fever is caused by something that triggers an allergic reaction (allergens). The following are examples of allergens:  Ragweed.  Feathers.  Animal dander.  Grass and tree pollens.  Cigarette smoke.  House dust.  Pollution. SYMPTOMS   Sneezing.  Runny or  stuffy nose.  Tearing eyes.  Itchy eyes, nose, mouth, throat, skin, or other area.  Sore throat.  Headache.  Decreased sense of smell or taste. DIAGNOSIS Your caregiver will perform a physical exam and ask questions about the symptoms you are having.Allergy testing may be done to determine exactly what triggers your hay fever.  TREATMENT   Over-the-counter medicines may help symptoms. These include:  Antihistamines.  Decongestants. These may help with nasal congestion.  Your caregiver may prescribe medicines if over-the-counter medicines do not work.  Some people benefit from allergy shots when other medicines are not helpful. HOME CARE INSTRUCTIONS   Avoid the allergen that is causing your symptoms, if possible.  Take all medicine as told by your caregiver. SEEK MEDICAL CARE IF:   You have severe allergy symptoms and your current medicines are not helping.  Your treatment was working at one time, but you are now experiencing symptoms.  You have sinus congestion and pressure.  You develop a fever or headache.  You have thick nasal discharge.  You have asthma and have a worsening cough and wheezing. SEEK IMMEDIATE MEDICAL CARE IF:   You have swelling of your tongue or lips.  You have trouble breathing.  You feel lightheaded or like you are going to faint.  You have cold sweats.  You have a fever. Document Released: 05/22/2005 Document Revised: 08/14/2011 Document Reviewed: 08/17/2010 [facility] Patient Information 2015 [place], [place]. This information  is not intended to replace advice given to you by your health care provider. Make sure you discuss any questions you have with your health care provider. Hypertension Hypertension, commonly called high blood pressure, is when the force of blood pumping through your arteries is too strong. Your arteries are the blood vessels that carry blood from your heart throughout your body. A blood pressure reading consists of  a higher number over a lower number, such as 110/72. The higher number (systolic) is the pressure inside your arteries when your heart pumps. The lower number (diastolic) is the pressure inside your arteries when your heart relaxes. Ideally you want your blood pressure below 120/80. Hypertension forces your heart to work harder to pump blood. Your arteries may become narrow or stiff. Having hypertension puts you at risk for heart disease, stroke, and other problems.  RISK FACTORS Some risk factors for high blood pressure are controllable. Others are not.  Risk factors you cannot control include:   Race. You may be at higher risk if you are African American.  Age. Risk increases with age.  Gender. Men are at higher risk than women before age 63 years. After age 15, women are at higher risk than men. Risk factors you can control include:  Not getting enough exercise or physical activity.  Being overweight.  Getting too much fat, sugar, calories, or salt in your diet.  Drinking too much alcohol. SIGNS AND SYMPTOMS Hypertension does not usually cause signs or symptoms. Extremely high blood pressure (hypertensive crisis) may cause headache, anxiety, shortness of breath, and nosebleed. DIAGNOSIS  To check if you have hypertension, your health care provider will measure your blood pressure while you are seated, with your arm held at the level of your heart. It should be measured at least twice using the same arm. Certain conditions can cause a difference in blood pressure between your right and left arms. A blood pressure reading that is higher than normal on one occasion does not mean that you need treatment. If one blood pressure reading is high, ask your health care provider about having it checked again. TREATMENT  Treating high blood pressure includes making lifestyle changes and possibly taking medicine. Living a healthy lifestyle can help lower high blood pressure. You may need to change  some of your habits. Lifestyle changes may include:  Following the DASH diet. This diet is high in fruits, vegetables, and whole grains. It is low in salt, red meat, and added sugars.  Getting at least 2 hours of brisk physical activity every week.  Losing weight if necessary.  Not smoking.  Limiting alcoholic beverages.  Learning ways to reduce stress. If lifestyle changes are not enough to get your blood pressure under control, your health care provider may prescribe medicine. You may need to take more than one. Work closely with your health care provider to understand the risks and benefits. HOME CARE INSTRUCTIONS  Have your blood pressure rechecked as directed by your health care provider.   Take medicines only as directed by your health care provider. Follow the directions carefully. Blood pressure medicines must be taken as prescribed. The medicine does not work as well when you skip doses. Skipping doses also puts you at risk for problems.   Do not smoke.   Monitor your blood pressure at home as directed by your health care provider. SEEK MEDICAL CARE IF:   You think you are having a reaction to medicines taken.  You have recurrent headaches or feel  dizzy.  You have swelling in your ankles.  You have trouble with your vision. SEEK IMMEDIATE MEDICAL CARE IF:  You develop a severe headache or confusion.  You have unusual weakness, numbness, or feel faint.  You have severe chest or abdominal pain.  You vomit repeatedly.  You have trouble breathing. MAKE SURE YOU:   Understand these instructions.  Will watch your condition.  Will get help right away if you are not doing well or get worse. Document Released: 05/22/2005 Document Revised: 10/06/2013 Document Reviewed: 03/14/2013 [facility] Patient Information 2015 [place], [place]. This information is not intended to replace advice given to you by your health care provider. Make sure you discuss any questions  you have with your health care provider.

## 2014-09-14 LAB — PSA: PSA: 0.42 ng/mL (ref <=4.00)

## 2014-09-20 ENCOUNTER — Encounter: Payer: Self-pay | Admitting: Family Medicine

## 2014-10-13 ENCOUNTER — Ambulatory Visit (INDEPENDENT_AMBULATORY_CARE_PROVIDER_SITE_OTHER): Payer: 59 | Admitting: Family Medicine

## 2014-10-13 VITALS — BP 154/94 | HR 81 | Temp 98.0°F | Resp 17 | Ht 72.5 in | Wt 251.0 lb

## 2014-10-13 DIAGNOSIS — I10 Essential (primary) hypertension: Secondary | ICD-10-CM

## 2014-10-13 MED ORDER — AMLODIPINE BESYLATE 10 MG PO TABS: 10.0000 mg | ORAL_TABLET | Freq: Every day | ORAL | Status: DC

## 2014-10-13 NOTE — Patient Instructions (Signed)
Take 2 of the amlodipine tablets until you run out. Then go to the pharmacy and they will have a 10 mg tablet that you take once a day.

## 2014-10-13 NOTE — Progress Notes (Signed)
Subjective:    Patient ID: Donald [name], male    DOB: 11-Aug-1970, 44 y.o.   MRN: [id number] This chart was scribed for Robyn Haber, MD by Martinique Peace, ED Scribe. The patient was seen in RM12. The patient's care was started at 8:52 PM.  Chief Complaint  Patient presents with   Hypertension   Headache      HPI HPI Comments: Donald [name] is a 44 y.o. male who presents to the [facility] complaining of continuous episodes of hypertension over the past week with associated headache and intermittent chest pain (onset yesterday). No complaints of SOB, nausea, or diaphoresis. BP reading here in the room during encounter was 124/84, compared to 154/94 when pt first arrived. Last dosage of BP medication was this morning. 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 Pt is non-smoker.      Past Medical History  Diagnosis Date   Hyperlipidemia     NOT TAKING MEDICATION   Hypertension    Hypercholesterolemia    Right bundle branch block    PARASOMNIA    NEPHROLITHIASIS, HX OF    Lumbago    G E R D    Dysuria    DEPRESSION, MILD    History   Social History   Marital Status: Single    Spouse Name: N/A   Number of Children: 2   Years of Education: 64   Occupational History   machine operator Economist   Social History Main Topics   Smoking status: Never Smoker    Smokeless tobacco: Never Used   Alcohol Use: No   Drug Use: No   Sexual Activity: Not on file   Other Topics Concern   Not on file   Social History Narrative   ** Merged History Encounter **       Allergies  Allergen Reactions   Ace Inhibitors Cough    Lisinopril caused cough   Prior to Admission medications   Medication Sig Start Date End Date Taking? Authorizing Provider  amLODipine (NORVASC) 5 MG tablet Take 1 tablet (5 mg total) by mouth daily. 09/13/14  Yes Shawnee Knapp, MD  aspirin 325 MG tablet Take 325 mg by mouth daily.   Yes Historical Provider, MD  cetirizine  (ZYRTEC) 10 MG tablet Take 1 tablet (10 mg total) by mouth at bedtime. 09/13/14  Yes Shawnee Knapp, MD  fluticasone (FLONASE) 50 MCG/ACT nasal spray Place 2 sprays into both nostrils at bedtime. 09/13/14  Yes Shawnee Knapp, MD  losartan-hydrochlorothiazide (HYZAAR) 100-25 MG per tablet Take 1 tablet by mouth daily. 03/13/14  Yes Elby Beck, FNP  pravastatin (PRAVACHOL) 40 MG tablet Take 1 tablet (40 mg total) by mouth daily. 03/13/14  Yes Elby Beck, FNP      Review of Systems  Constitutional: Negative for diaphoresis.  Respiratory: Negative for shortness of breath.   Cardiovascular: Positive for chest pain.  Gastrointestinal: Negative for nausea.  Neurological: Positive for headaches.       Objective:   Physical Exam  Constitutional: He is oriented to person, place, and time. He appears well-developed and well-nourished. No distress.  HENT:  Head: Normocephalic and atraumatic.  Eyes: Conjunctivae and EOM are normal.  Neck: Neck supple. No tracheal deviation present.  Cardiovascular: Normal rate and regular rhythm.   No murmur heard. Loud S2.   Pulmonary/Chest: Effort normal and breath sounds normal. No respiratory distress.  Musculoskeletal: Normal range of motion.  Neurological: He is alert and oriented  to person, place, and time.  Skin: Skin is warm and dry.  Psychiatric: He has a normal mood and affect. His behavior is normal.  Nursing note and vitals reviewed.     8:56 PM- Treatment plan was discussed with patient who verbalizes understanding and agrees.      Assessment & Plan:   This chart was scribed in my presence and reviewed by me personally.    ICD-9-CM ICD-10-CM   1. Essential hypertension 401.9 I10 amLODipine (NORVASC) 10 MG tablet   Follow up in 2 weeks.  We'll do complete lab and cardiac eval then  Signed, Robyn Haber, MD

## 2015-02-16 ENCOUNTER — Other Ambulatory Visit: Payer: Self-pay | Admitting: Family Medicine

## 2015-03-04 ENCOUNTER — Ambulatory Visit (INDEPENDENT_AMBULATORY_CARE_PROVIDER_SITE_OTHER): Payer: 59 | Admitting: Physician Assistant

## 2015-03-04 VITALS — BP 130/80 | HR 74 | Temp 98.2°F | Resp 16 | Ht 72.05 in | Wt 255.0 lb

## 2015-03-04 DIAGNOSIS — I10 Essential (primary) hypertension: Secondary | ICD-10-CM | POA: Diagnosis not present

## 2015-03-04 DIAGNOSIS — E785 Hyperlipidemia, unspecified: Secondary | ICD-10-CM

## 2015-03-04 LAB — COMPREHENSIVE METABOLIC PANEL
ALK PHOS: 60 U/L (ref 40–115)
ALT: 17 U/L (ref 9–46)
AST: 18 U/L (ref 10–40)
Albumin: 4.4 g/dL (ref 3.6–5.1)
BUN: 15 mg/dL (ref 7–25)
CALCIUM: 9.7 mg/dL (ref 8.6–10.3)
CHLORIDE: 100 mmol/L (ref 98–110)
CO2: 27 mmol/L (ref 20–31)
Creat: 0.83 mg/dL (ref 0.60–1.35)
GLUCOSE: 83 mg/dL (ref 65–99)
POTASSIUM: 3.9 mmol/L (ref 3.5–5.3)
Sodium: 138 mmol/L (ref 135–146)
Total Bilirubin: 0.7 mg/dL (ref 0.2–1.2)
Total Protein: 7.6 g/dL (ref 6.1–8.1)

## 2015-03-04 LAB — LIPID PANEL
CHOL/HDL RATIO: 4.8 ratio (ref <=5.0)
CHOLESTEROL: 181 mg/dL (ref 125–200)
HDL: 38 mg/dL — AB (ref >=40)
LDL Cholesterol: 114 mg/dL (ref <130)
Triglycerides: 146 mg/dL (ref <150)
VLDL: 29 mg/dL (ref <30)

## 2015-03-04 MED ORDER — PRAVASTATIN SODIUM 40 MG PO TABS: 40.0000 mg | ORAL_TABLET | Freq: Every day | ORAL | Status: DC

## 2015-03-04 MED ORDER — AMLODIPINE BESYLATE 10 MG PO TABS: 10.0000 mg | ORAL_TABLET | Freq: Every day | ORAL | Status: DC

## 2015-03-04 NOTE — Progress Notes (Signed)
   Subjective:    Patient ID: Donald [name], male    DOB: [birth year], 44 y.o.   MRN: [id number]  HPI Patient presents for refills of pravastatin and amlodipine and states that he has been compliant with both medications. Denies side effects. States that he does not follow any particular diet and walks for 10 minutes daily. Denies smoking cigarettes, using illicit drugs, or drinking alcohol. Denies h/o DM. Med allergy: ACE-I.   Review of Systems  Constitutional: Negative for fever, activity change, appetite change, fatigue and unexpected weight change.  Respiratory: Negative for shortness of breath.   Cardiovascular: Negative for chest pain, palpitations and leg swelling.  Gastrointestinal: Negative for nausea and vomiting.  Neurological: Negative for dizziness and headaches.       Objective:   Physical Exam  Constitutional: He is oriented to person, place, and time. He appears well-developed and well-nourished. No distress.  Blood pressure 130/80, pulse 74, temperature 98.2 F (36.8 C), temperature source Other (Comment), resp. rate 16, height 6' 0.05" (1.83 m), weight 255 lb (115.667 kg), SpO2 99 %.   HENT:  Head: Normocephalic and atraumatic.  Right Ear: External ear normal.  Left Ear: External ear normal.  Eyes: Conjunctivae are normal. Pupils are equal, round, and reactive to light. Right eye exhibits no discharge. Left eye exhibits no discharge. No scleral icterus.  Neck: Neck supple. No JVD present. No thyromegaly present.  Cardiovascular: Normal rate, regular rhythm, normal heart sounds and intact distal pulses.  Exam reveals no gallop and no friction rub.   No murmur heard. Pulmonary/Chest: Effort normal and breath sounds normal. No respiratory distress. He has no wheezes. He has no rales.  Musculoskeletal: He exhibits no edema.  Lymphadenopathy:    He has no cervical adenopathy.  Neurological: He is alert and oriented to person, place, and time.  Skin: Skin is warm and  dry. No rash noted. He is not diaphoretic. No erythema.  Psychiatric: He has a normal mood and affect. His behavior is normal. Judgment and thought content normal.       Assessment & Plan:  1. Essential hypertension Lifestyle modifications discussed. States that he will increase is physical activity. - Comprehensive metabolic panel - amLODipine (NORVASC) 10 MG tablet; Take 1 tablet (10 mg total) by mouth daily.  Dispense: 30 tablet; Refill: 5  2. Hyperlipidemia - Lipid panel - pravastatin (PRAVACHOL) 40 MG tablet; Take 1 tablet (40 mg total) by mouth daily.  Dispense: 90 tablet; Refill: [place] PA-C  [facility] 03/04/2015 2:35 PM

## 2015-03-04 NOTE — Patient Instructions (Signed)

## 2015-03-08 ENCOUNTER — Encounter: Payer: Self-pay | Admitting: Physician Assistant

## 2015-03-10 ENCOUNTER — Other Ambulatory Visit: Payer: Self-pay | Admitting: Family Medicine

## 2015-04-05 ENCOUNTER — Encounter: Payer: 59 | Admitting: Family Medicine

## 2015-05-04 ENCOUNTER — Ambulatory Visit (INDEPENDENT_AMBULATORY_CARE_PROVIDER_SITE_OTHER): Payer: 59 | Admitting: Family Medicine

## 2015-05-04 DIAGNOSIS — G4489 Other headache syndrome: Secondary | ICD-10-CM | POA: Diagnosis not present

## 2015-05-04 DIAGNOSIS — I10 Essential (primary) hypertension: Secondary | ICD-10-CM

## 2015-05-04 NOTE — Progress Notes (Signed)
[facility] [address] [place] [address] [phone]  Date:  05/04/2015   Name:  Donald [name]   DOB:  [birth year]   MRN:  [id number]  PCP:  No PCP Per Patient    Chief Complaint: Headache   History of Present Illness:  Donald [name] is a 44 y.o. very pleasant male patient who presents with the following: Headache began last night:  - Began last night - Feels tight posterior neck.  - Takes 1 325 ASA daily. Taking tylenol 1 q6h ( this resolved the majority of his pain).  - Mild pain overall.  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  - Worried his BP is too high.  Believes he is taking losartan/HCTZ in addition to amlodipine although this was shown as discontinued.  - No previous HA similar to this.  ROS as listed below.   Patient Active Problem List   Diagnosis Date Noted  . Right bundle branch block 03/11/2010  . DEPRESSION, MILD 03/09/2010  . LUMBAGO 03/09/2010  . DYSURIA 03/09/2010  . NEPHROLITHIASIS, HX OF 12/10/2009  . Essential hypertension 05/14/2009  . PARASOMNIA 05/14/2009  . Hyperlipidemia 05/13/2009  . G E R D 05/13/2009    Past Medical History  Diagnosis Date  . Hyperlipidemia     NOT TAKING MEDICATION  . Hypertension   . Hypercholesterolemia   . Right bundle branch block   . PARASOMNIA   . NEPHROLITHIASIS, HX OF   . Lumbago   . G E R D   . Dysuria   . DEPRESSION, MILD     Past Surgical History  Procedure Laterality Date  . Right and left hand    . Hand surgery      Social History  Substance Use Topics  . Smoking status: Never Smoker   . Smokeless tobacco: Never Used  . Alcohol Use: No    Family History  Problem Relation Age of Onset  . Hypertension Mother     Allergies  Allergen Reactions  . Ace Inhibitors Cough    Lisinopril caused cough    Medication list has been reviewed and updated.  Current Outpatient Prescriptions on File Prior to Visit  Medication  Sig Dispense Refill  . amLODipine (NORVASC) 10 MG tablet Take 1 tablet (10 mg total) by mouth daily. 30 tablet 5  . aspirin 325 MG tablet Take 325 mg by mouth daily.    . cetirizine (ZYRTEC) 10 MG tablet Take 1 tablet (10 mg total) by mouth at bedtime. 30 tablet 11  . pravastatin (PRAVACHOL) 40 MG tablet Take 1 tablet (40 mg total) by mouth daily. 90 tablet 1   No current facility-administered medications on file prior to visit.    Review of Systems:  Review of Systems  Constitutional: Negative for fever, chills and diaphoresis.  HENT: Negative for congestion, ear pain, nosebleeds and sore throat.   Eyes: Negative for blurred vision, double vision, photophobia and pain.  Respiratory: Negative for cough and shortness of breath.   Cardiovascular: Negative for chest pain and palpitations.  Gastrointestinal: Negative for nausea and vomiting.  Skin: Negative for rash.  Neurological: Positive for headaches. Negative for dizziness, tingling, sensory change and speech change.  Psychiatric/Behavioral: Negative for depression. The patient is not nervous/anxious.     Physical Examination: 158/80 GEN: WDWN, NAD, Non-toxic, A & O x 3 HEENT: Atraumatic, Normocephalic. Neck supple. No masses, No LAD.  Neck: Full RoM with negative Spurling's b/l. Mildly  Tender along cervical paraspinal muscles. No skin changes. NO spinous process tenderness. Ears and Nose: No external deformity. CV: RRR, No M/G/R. No JVD. No thrill. No extra heart sounds. PULM: CTA B, no wheezes, crackles, rhonchi. No retractions. No resp. distress. No accessory muscle use. EXTR: No c/c/e NEURO Normal gait.  PSYCH: Normally interactive. Conversant. Not depressed or anxious appearing.  Calm demeanor.   Assessment and Plan: Cervical HA: Manufacturing job. No other symptoms including those that would suggest infection or neuro involvement.  Tylenol completely relieves his pain.  Discuss preserving normal rom with periodic exercises.     HTN: Chart suggests he should only be taking amlodipine.  He states he is taking amlodipine as well as lisinopril- HCTZ (although he is not certain). Likely needs another medicaiton added to his regimen. He will bring his medications to the office in 1-2 weeks so that we can be confident in the medications he is taking and make necessary adjustments.    Signed Gerre Pebbles, MD

## 2015-05-04 NOTE — Patient Instructions (Signed)
Hypertension Hypertension, commonly called high blood pressure, is when the force of blood pumping through your arteries is too strong. Your arteries are the blood vessels that carry blood from your heart throughout your body. A blood pressure reading consists of a higher number over a lower number, such as 110/72. The higher number (systolic) is the pressure inside your arteries when your heart pumps. The lower number (diastolic) is the pressure inside your arteries when your heart relaxes. Ideally you want your blood pressure below 120/80. Hypertension forces your heart to work harder to pump blood. Your arteries may become narrow or stiff. Having untreated or uncontrolled hypertension can cause heart attack, stroke, kidney disease, and other problems. RISK FACTORS Some risk factors for high blood pressure are controllable. Others are not.  Risk factors you cannot control include:   Race. You may be at higher risk if you are African American.  Age. Risk increases with age.  Gender. Men are at higher risk than women before age 45 years. After age 65, women are at higher risk than men. Risk factors you can control include:  Not getting enough exercise or physical activity.  Being overweight.  Getting too much fat, sugar, calories, or salt in your diet.  Drinking too much alcohol. SIGNS AND SYMPTOMS Hypertension does not usually cause signs or symptoms. Extremely high blood pressure (hypertensive crisis) may cause headache, anxiety, shortness of breath, and nosebleed. DIAGNOSIS To check if you have hypertension, your health care provider will measure your blood pressure while you are seated, with your arm held at the level of your heart. It should be measured at least twice using the same arm. Certain conditions can cause a difference in blood pressure between your right and left arms. A blood pressure reading that is higher than normal on one occasion does not mean that you need treatment. If  it is not clear whether you have high blood pressure, you may be asked to return on a different day to have your blood pressure checked again. Or, you may be asked to monitor your blood pressure at home for 1 or more weeks. TREATMENT Treating high blood pressure includes making lifestyle changes and possibly taking medicine. Living a healthy lifestyle can help lower high blood pressure. You may need to change some of your habits. Lifestyle changes may include:  Following the DASH diet. This diet is high in fruits, vegetables, and whole grains. It is low in salt, red meat, and added sugars.  Keep your sodium intake below 2,300 mg per day.  Getting at least 30-45 minutes of aerobic exercise at least 4 times per week.  Losing weight if necessary.  Not smoking.  Limiting alcoholic beverages.  Learning ways to reduce stress. Your health care provider may prescribe medicine if lifestyle changes are not enough to get your blood pressure under control, and if one of the following is true:  You are 18-59 years of age and your systolic blood pressure is above 140.  You are 60 years of age or older, and your systolic blood pressure is above 150.  Your diastolic blood pressure is above 90.  You have diabetes, and your systolic blood pressure is over 140 or your diastolic blood pressure is over 90.  You have kidney disease and your blood pressure is above 140/90.  You have heart disease and your blood pressure is above 140/90. Your personal target blood pressure may vary depending on your medical conditions, your age, and other factors. HOME CARE INSTRUCTIONS    Have your blood pressure rechecked as directed by your health care provider.   Take medicines only as directed by your health care provider. Follow the directions carefully. Blood pressure medicines must be taken as prescribed. The medicine does not work as well when you skip doses. Skipping doses also puts you at risk for  problems.  Do not smoke.   Monitor your blood pressure at home as directed by your health care provider. SEEK MEDICAL CARE IF:   You think you are having a reaction to medicines taken.  You have recurrent headaches or feel dizzy.  You have swelling in your ankles.  You have trouble with your vision. SEEK IMMEDIATE MEDICAL CARE IF:  You develop a severe headache or confusion.  You have unusual weakness, numbness, or feel faint.  You have severe chest or abdominal pain.  You vomit repeatedly.  You have trouble breathing. MAKE SURE YOU:   Understand these instructions.  Will watch your condition.  Will get help right away if you are not doing well or get worse.   This information is not intended to replace advice given to you by your health care provider. Make sure you discuss any questions you have with your health care provider.   Document Released: 05/22/2005 Document Revised: 10/06/2014 Document Reviewed: 03/14/2013 Elsevier Interactive Patient Education 2016 Elsevier Inc.  

## 2015-05-17 ENCOUNTER — Encounter: Payer: Self-pay | Admitting: Family Medicine

## 2015-05-17 ENCOUNTER — Ambulatory Visit (INDEPENDENT_AMBULATORY_CARE_PROVIDER_SITE_OTHER): Payer: 59 | Admitting: Family Medicine

## 2015-05-17 VITALS — BP 129/82 | HR 73 | Temp 98.0°F | Resp 16 | Ht 72.75 in | Wt 259.6 lb

## 2015-05-17 DIAGNOSIS — M79661 Pain in right lower leg: Secondary | ICD-10-CM

## 2015-05-17 DIAGNOSIS — Z0000 Encounter for general adult medical examination without abnormal findings: Secondary | ICD-10-CM | POA: Diagnosis not present

## 2015-05-17 DIAGNOSIS — I10 Essential (primary) hypertension: Secondary | ICD-10-CM

## 2015-05-17 DIAGNOSIS — E785 Hyperlipidemia, unspecified: Secondary | ICD-10-CM | POA: Diagnosis not present

## 2015-05-17 DIAGNOSIS — R600 Localized edema: Secondary | ICD-10-CM

## 2015-05-17 DIAGNOSIS — Z125 Encounter for screening for malignant neoplasm of prostate: Secondary | ICD-10-CM

## 2015-05-17 DIAGNOSIS — R682 Dry mouth, unspecified: Secondary | ICD-10-CM

## 2015-05-17 DIAGNOSIS — M542 Cervicalgia: Secondary | ICD-10-CM

## 2015-05-17 DIAGNOSIS — M79662 Pain in left lower leg: Secondary | ICD-10-CM

## 2015-05-17 DIAGNOSIS — R519 Headache, unspecified: Secondary | ICD-10-CM

## 2015-05-17 DIAGNOSIS — R51 Headache: Secondary | ICD-10-CM

## 2015-05-17 DIAGNOSIS — Z130 Encounter for screening for diseases of the blood and blood-forming organs and certain disorders involving the immune mechanism: Secondary | ICD-10-CM | POA: Diagnosis not present

## 2015-05-17 LAB — CBC
HEMATOCRIT: 41 % (ref 39.0–52.0)
HEMOGLOBIN: 14.2 g/dL (ref 13.0–17.0)
MCH: 27.6 pg (ref 26.0–34.0)
MCHC: 34.6 g/dL (ref 30.0–36.0)
MCV: 79.8 fL (ref 78.0–100.0)
MPV: 9 fL (ref 8.6–12.4)
Platelets: 317 10*3/uL (ref 150–400)
RBC: 5.14 MIL/uL (ref 4.22–5.81)
RDW: 14.8 % (ref 11.5–15.5)
WBC: 6 10*3/uL (ref 4.0–10.5)

## 2015-05-17 LAB — CK: CK TOTAL: 248 U/L — AB (ref 7–232)

## 2015-05-17 LAB — GLUCOSE, POCT (MANUAL RESULT ENTRY): POC Glucose: 99 mg/dl (ref 70–99)

## 2015-05-17 MED ORDER — PRAVASTATIN SODIUM 40 MG PO TABS: 40.0000 mg | ORAL_TABLET | Freq: Every day | ORAL | Status: DC

## 2015-05-17 MED ORDER — LOSARTAN POTASSIUM-HCTZ 100-25 MG PO TABS: 1.0000 | ORAL_TABLET | Freq: Every day | ORAL | Status: DC

## 2015-05-17 MED ORDER — CYCLOBENZAPRINE HCL 5 MG PO TABS: 2.5000 mg | ORAL_TABLET | Freq: Every evening | ORAL | Status: DC | PRN

## 2015-05-17 MED ORDER — AMLODIPINE BESYLATE 10 MG PO TABS: 10.0000 mg | ORAL_TABLET | Freq: Every day | ORAL | Status: DC

## 2015-05-17 NOTE — Patient Instructions (Addendum)
You should receive a call or letter about your lab results within the next week to 10 days.    Tylenol or occasional advil for calf pains and head and neck pain. See information on neck positioning at work., range of motion throughout the day, and heat to back of neck during day as needed. If pain or spasm at night and difficulty sleeping - ok to try 1/2 to 1 of the flexeril (this medicine can cause sedation). If pain not improving in next 3-4 weeks, return for recheck. Return to the clinic or go to the nearest emergency room if any of your symptoms worsen or new symptoms occur.  Blood pressure appears controlled here. Keep a record of your blood pressures outside of the office and bring them to the next office visit. If swelling returns in your legs, may need to decrease dose of amlodipine and add another medicine, but NO changes for now.   Increase fluid intake during the day to see if this helps with dry mouth. Headaches can also be due to not drinking enough fluids.   Return to the clinic or go to the nearest emergency room if any of your symptoms worsen or new symptoms occur.   Keeping you healthy  Get these tests  Blood pressure- Have your blood pressure checked once a year by your healthcare provider.  Normal blood pressure is 120/80.  Weight- Have your body mass index (BMI) calculated to screen for obesity.  BMI is a measure of body fat based on height and weight. You can also calculate your own BMI at [employer]velBags.it.  Cholesterol- Have your cholesterol checked regularly starting at age 58, sooner may be necessary if you have diabetes, high blood pressure, if a family member developed heart diseases at an early age or if you smoke.   Chlamydia, HIV, and other sexual transmitted disease- Get screened each year until the age of 29 then within three months of each new sexual partner.  Diabetes- Have your blood sugar checked regularly if you have high blood pressure, high  cholesterol, a family history of diabetes or if you are overweight.  Get these vaccines  Flu shot- Every fall.  Tetanus shot- Every 10 years.  Menactra- Single dose; prevents meningitis.  Take these steps  Don't smoke- If you do smoke, ask your healthcare provider about quitting. For tips on how to quit, go to www.smokefree.gov or call 1-800-QUIT-NOW.  Be physically active- Exercise 5 days a week for at least 30 minutes.  If you are not already physically active start slow and gradually work up to 30 minutes of moderate physical activity.  Examples of moderate activity include walking briskly, mowing the yard, dancing, swimming bicycling, etc.  Eat a healthy diet- Eat a variety of healthy foods such as fruits, vegetables, low fat milk, low fat cheese, yogurt, lean meats, poultry, fish, beans, tofu, etc.  For more information on healthy eating, go to www.thenutritionsource.org  Drink alcohol in moderation- Limit alcohol intake two drinks or less a day.  Never drink and drive.  Dentist- Brush and floss teeth twice daily; visit your dentis twice a year.  Depression-Your emotional health is as important as your physical health.  If you're feeling down, losing interest in things you normally enjoy please talk with your healthcare provider.  Gun Safety- If you keep a gun in your home, keep it unloaded and with the safety lock on.  Bullets should be stored separately.  Helmet use- Always wear a helmet when riding  a motorcycle, bicycle, rollerblading or skateboarding.  Safe sex- If you may be exposed to a sexually transmitted infection, use a condom  Seat belts- Seat bels can save your life; always wear one.  Smoke/Carbon Monoxide detectors- These detectors need to be installed on the appropriate level of your home.  Replace batteries at least once a year.  Skin Cancer- When out in the sun, cover up and use sunscreen SPF 15 or higher.  Violence- If anyone is threatening or hurting you,  please tell your healthcare provider.  Back/Neck: How to set up your workstation Chair:  An adjustable chair to fit your height with a slight downward tilt of chair seat is helpful.  Should have good high-back support that assists with keeping the natural curves of your spine.  A "lumbar roll" or pillow at your low back can help you keep good posture.  Adjustable arm rests may decrease strain in upper body.   Keyboard:  Should be close and at elbow or just below elbow height.  Split keyboards can help decrease strain on wrist.  Wrist supports can provide mini-breaks to your wrists throughout the day.  Monitor/Screen:  Top of screen should be at eye level or slightly lower.  Good lighting is important to prevent eye strain, or headaches from glare.  Head Posture: A copy holder on either side of the monitor will help limit neck strain.  Head should not be forward.  Ears should line up with you shoulders.  Leg Posture:  Knee and hips should be bent half-way (about a 90 degree angle).  Shorter people may need something to prop their feet on, with knees bent, like a phone book.  Other Stuff: Keep the stuff (phones, pens, phonebooks, etc) that you use frequently close to you, so you're not straining to reach them.  Hypertension Hypertension, commonly called high blood pressure, is when the force of blood pumping through your arteries is too strong. Your arteries are the blood vessels that carry blood from your heart throughout your body. A blood pressure reading consists of a higher number over a lower number, such as 110/72. The higher number (systolic) is the pressure inside your arteries when your heart pumps. The lower number (diastolic) is the pressure inside your arteries when your heart relaxes. Ideally you want your blood pressure below 120/80. Hypertension forces your heart to work harder to pump blood. Your arteries may become narrow or stiff. Having untreated or uncontrolled hypertension  can cause heart attack, stroke, kidney disease, and other problems. RISK FACTORS Some risk factors for high blood pressure are controllable. Others are not.  Risk factors you cannot control include:  6. Race. You may be at higher risk if you are African American. 7. Age. Risk increases with age. 8. Gender. Men are at higher risk than women before age 55 years. After age 108, women are at higher risk than men. Risk factors you can control include: 4. Not getting enough exercise or physical activity. 5. Being overweight. 6. Getting too much fat, sugar, calories, or salt in your diet. 7. Drinking too much alcohol. SIGNS AND SYMPTOMS Hypertension does not usually cause signs or symptoms. Extremely high blood pressure (hypertensive crisis) may cause headache, anxiety, shortness of breath, and nosebleed. DIAGNOSIS To check if you have hypertension, your health care provider will measure your blood pressure while you are seated, with your arm held at the level of your heart. It should be measured at least twice using the same arm. Certain conditions  can cause a difference in blood pressure between your right and left arms. A blood pressure reading that is higher than normal on one occasion does not mean that you need treatment. If it is not clear whether you have high blood pressure, you may be asked to return on a different day to have your blood pressure checked again. Or, you may be asked to monitor your blood pressure at home for 1 or more weeks. TREATMENT Treating high blood pressure includes making lifestyle changes and possibly taking medicine. Living a healthy lifestyle can help lower high blood pressure. You may need to change some of your habits. Lifestyle changes may include: 14. Following the DASH diet. This diet is high in fruits, vegetables, and whole grains. It is low in salt, red meat, and added sugars. 15. Keep your sodium intake below 2,300 mg per day. 16. Getting at least 30-45  minutes of aerobic exercise at least 4 times per week. 58. Losing weight if necessary. 18. Not smoking. 19. Limiting alcoholic beverages. 20. Learning ways to reduce stress. Your health care provider may prescribe medicine if lifestyle changes are not enough to get your blood pressure under control, and if one of the following is true:  You are 70-75 years of age and your systolic blood pressure is above 140.  You are 63 years of age or older, and your systolic blood pressure is above 150.  Your diastolic blood pressure is above 90.  You have diabetes, and your systolic blood pressure is over [id number] or your diastolic blood pressure is over 90.  You have kidney disease and your blood pressure is above 140/90.  You have heart disease and your blood pressure is above 140/90. Your personal target blood pressure may vary depending on your medical conditions, your age, and other factors. HOME CARE INSTRUCTIONS  Have your blood pressure rechecked as directed by your health care provider.   Take medicines only as directed by your health care provider. Follow the directions carefully. Blood pressure medicines must be taken as prescribed. The medicine does not work as well when you skip doses. Skipping doses also puts you at risk for problems.  Do not smoke.   Monitor your blood pressure at home as directed by your health care provider. SEEK MEDICAL CARE IF:   You think you are having a reaction to medicines taken.  You have recurrent headaches or feel dizzy.  You have swelling in your ankles.  You have trouble with your vision. SEEK IMMEDIATE MEDICAL CARE IF:  You develop a severe headache or confusion.  You have unusual weakness, numbness, or feel faint.  You have severe chest or abdominal pain.  You vomit repeatedly.  You have trouble breathing. MAKE SURE YOU:   Understand these instructions.  Will watch your condition.  Will get help right away if you are not doing  well or get worse.   This information is not intended to replace advice given to you by your health care provider. Make sure you discuss any questions you have with your health care provider.   Document Released: 05/22/2005 Document Revised: 10/06/2014 Document Reviewed: 03/14/2013 Elsevier Interactive Patient Education [employer].

## 2015-05-17 NOTE — Progress Notes (Addendum)
Subjective:  By signing my name below, I, Donald [name], attest that this documentation has been prepared under the direction and in the presence of Donald Ray, MD.  Electronically Signed: Thea [name], ED Scribe. 05/17/2015. 1:40 PM.   Authored by Donald Forehand, MD.  Unable to change in [facility].    Patient ID: Donald [name], male    DOB: [birth year], 44 y.o.   MRN: [id number]  HPI Chief Complaint  Patient presents with  . Annual Exam   HPI Comments: Donald [name] is a 44 y.o. male who presents to the [facility] for a physical. Hx of HTN, RBB, HLD, and depression.   Cancer screening He reports family hx of colon cancer in his aunts.  Prostate cancer screening- pt would like to have this done today.   Immunizations Immunization History  Administered Date(s) Administered  . Influenza Whole 03/09/2010  . Td 03/09/2010   Flu shot- he declines flu shot today  Depression screening Depression screen [facility] 2/9 05/04/2015 03/04/2015  Decreased Interest 0 0  Down, Depressed, Hopeless 0 0  PHQ - 2 Score 0 0   Vision  Visual Acuity Screening   Right eye Left eye Both eyes  Without correction: 20/20 20/25 20/20   With correction:      He is not seen by Optho regularly.   Dentist Pt is seen be a dentist every 6 months.   Exercise  Pt does not exercise regularly.   Hypertension Seen for headache 2 weeks ago. He was on amlodipine, lisinopril and HCTZ, but exact medication were unknown. Was instructed to bring medications today. 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 Tylenol, ROM and exercises were recommended. He had exercise stress test by Dr. Johnsie Cancel. 11.7 mets. Normal stress test.   Pt checks BP at [facility], last BP was 164/90-94  Hyperlipidemia  Lab Results  Component Value Date   CHOL 181 03/04/2015   HDL 38* 03/04/2015   LDLCALC 114 03/04/2015   TRIG 146 03/04/2015   CHOLHDL 4.8 03/04/2015   Lab Results  Component Value Date   ALT 17  03/04/2015   AST 18 03/04/2015   ALKPHOS 60 03/04/2015   BILITOT 0.7 03/04/2015   Stable. Cont on Pravachol 40mg  a day.    Headaches Pt is still having headaches. He has been taking tylenol with minimal relief. 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 He notices pain after work at the end of the day. He has not been doing exercises as advised at last visit. Headache sometimes interferes with his sleep.  Muscle aches. Pt reports intermittent bilateral calf pain for the past 2 months with occasional swelling bilaterally. Prior to pain, pt first noticed leg swelling, onset around the time he started amlodipine.  Pt has been taking tylenol 3 times a day with relief to pain temporairly for a couple day. He denies new activity including standing or walking for long periods of time. He denies hx and family hx of clod clots.  No recent travel or long car rides.  Dry mouth He also c/o occasional dry mouth. He denies increased thirst during the day.      Pt is married. No sexual activity outside of marriage. He reports a non-reactive HIV testing in 2005.  Patient Active Problem List   Diagnosis Date Noted  . Right bundle branch block 03/11/2010  . DEPRESSION, MILD 03/09/2010  . LUMBAGO 03/09/2010  . DYSURIA 03/09/2010  .  NEPHROLITHIASIS, HX OF 12/10/2009  . Essential hypertension 05/14/2009  . PARASOMNIA 05/14/2009  . Hyperlipidemia 05/13/2009  . G E R D 05/13/2009   Past Medical History  Diagnosis Date  . Hyperlipidemia     NOT TAKING MEDICATION  . Hypertension   . Hypercholesterolemia   . Right bundle branch block   . PARASOMNIA   . NEPHROLITHIASIS, HX OF   . Lumbago   . G E R D   . Dysuria   . DEPRESSION, MILD    Past Surgical History  Procedure Laterality Date  . Right and left hand    . Hand surgery     Allergies  Allergen Reactions  . Ace Inhibitors Cough    Lisinopril caused cough   Prior to Admission medications    Medication Sig Start Date End Date Taking? Authorizing Provider  amLODipine (NORVASC) 10 MG tablet Take 1 tablet (10 mg total) by mouth daily. 03/04/15   Tishira R Brewington, PA-C  aspirin 325 MG tablet Take 325 mg by mouth daily.    Historical Provider, MD  cetirizine (ZYRTEC) 10 MG tablet Take 1 tablet (10 mg total) by mouth at bedtime. 09/13/14   Shawnee Knapp, MD  pravastatin (PRAVACHOL) 40 MG tablet Take 1 tablet (40 mg total) by mouth daily. 03/04/15   Nolene Bernheim, PA-C   Social History   Social History  . Marital Status: Single    Spouse Name: Donald [name]  . Number of Children: 2  . Years of Education: 15   Occupational History  . machine operator [place] History Main Topics  . Smoking status: Never Smoker   . Smokeless tobacco: Never Used  . Alcohol Use: No  . Drug Use: No  . Sexual Activity: Not on file   Other Topics Concern  . Not on file   Social History Narrative   ** Merged History Encounter **        Review of Systems .13 ROS reviewed. positive muscle aches and neck pain.   Objective:   Physical Exam  Constitutional: He is oriented to person, place, and time. He appears well-developed and well-nourished. No distress.  HENT:  Head: Normocephalic and atraumatic.  Right Ear: External ear normal.  Left Ear: External ear normal.  Mouth/Throat: Oropharynx is clear and moist.  Eyes: Conjunctivae and EOM are normal. Pupils are equal, round, and reactive to light.  Neck: Normal range of motion. Neck supple. No thyromegaly present.  Cardiovascular: Normal rate, regular rhythm, normal heart sounds and intact distal pulses.   Pulmonary/Chest: Effort normal and breath sounds normal. No respiratory distress. He has no wheezes.  Abdominal: Soft. He exhibits no distension. There is no tenderness. Hernia confirmed negative in the right inguinal area and confirmed negative in the left inguinal area.  Genitourinary: Prostate normal.  Musculoskeletal:  Normal range of motion. He exhibits no edema or tenderness.  Calves non tender. Negative Holman's. No appreciable LE edema.  Cervical- normal ROM. No appreciable pain or spasm.   Lymphadenopathy:    He has no cervical adenopathy.  Neurological: He is alert and oriented to person, place, and time. He has normal reflexes.  Bilateral shoulders normal ROM.  Cervical spine -  Skin: Skin is warm and dry.  Psychiatric: He has a normal mood and affect. His behavior is normal.  Nursing note and vitals reviewed.  Filed Vitals:   05/17/15 1340  BP: 129/82  Pulse: 73  Temp: 98 F (36.7 C)  TempSrc: Oral  Resp: 16  Height: 6' 0.75" (1.848 m)  Weight: 259 lb 9.6 oz (117.754 kg)  SpO2: 97%       Assessment & Plan:   Christa Munshi is a 44 y.o. male Annual physical exam  -anticipatory guidance as below in AVS, screening labs above. Health maintenance items as above in HPI discussed/recommended as applicable.   Bilateral calf pain - Plan: CK  -asymptomatic at present. rtc if recurs. No known rf's for DVT, and no edema noted on exam. May be due to standing work. RTC if isolated/unilateral symptoms, persistent or worsening symptoms. Trial of 1/2-1 Flexeril at bedtime as needed. Side effects discussed.  Bilateral lower extremity edema  -Now improved. Possibly secondary to amlodipine. If recurrence, would consider decreasing dose of amlodipine or change in meds regimen.  Essential hypertension  -Stable. No change in medications at this time. Monitor outside readings.  Hyperlipidemia - Plan: CK  -Will check CPK enzyme with his episodic myalgias in the cast, otherwise no change in medications currently.  Dry mouth - Plan: POCT glucose (manual entry)  -Increase fluid intake,  glucose reassuring. RTC if symptoms persist.  Screening for prostate cancer - Plan: PSA  -We discussed pros and cons of prostate cancer screening, and after this discussion, he chose to have screening done. PSA  obtained, and no concerning findings on DRE.   Screening, anemia, deficiency, iron - Plan: CBC  Nonintractable episodic headache, unspecified headache type - Plan: cyclobenzaprine (FLEXERIL) 5 MG tablet  Neck pain - Plan: cyclobenzaprine (FLEXERIL) 5 MG tablet   Meds ordered this encounter  Medications  . losartan-hydrochlorothiazide (HYZAAR) 100-25 MG tablet    Sig: Take 1 tablet by mouth daily.  . cyclobenzaprine (FLEXERIL) 5 MG tablet    Sig: Take 0.5-1 tablets (2.5-5 mg total) by mouth at bedtime as needed.    Dispense:  15 tablet    Refill:  0   Patient Instructions  You should receive a call or letter about your lab results within the next week to 10 days.    Tylenol or occasional advil for calf pains and head and neck pain. See information on neck positioning at work., range of motion throughout the day, and heat to back of neck during day as needed. If pain or spasm at night and difficulty sleeping - ok to try 1/2 to 1 of the flexeril (this medicine can cause sedation). If pain not improving in next 3-4 weeks, return for recheck. Return to the clinic or go to the nearest emergency room if any of your symptoms worsen or new symptoms occur.  Blood pressure appears controlled here. Keep a record of your blood pressures outside of the office and bring them to the next office visit. If swelling returns in your legs, may need to decrease dose of amlodipine and add another medicine, but NO changes for now.   Increase fluid intake during the day to see if this helps with dry mouth. Headaches can also be due to not drinking enough fluids.   Return to the clinic or go to the nearest emergency room if any of your symptoms worsen or new symptoms occur.   Keeping you healthy  Get these tests  Blood pressure- Have your blood pressure checked once a year by your healthcare provider.  Normal blood pressure is 120/80.  Weight- Have your body mass index (BMI) calculated to screen for  obesity.  BMI is a measure of body fat based on height and weight. You can also calculate your  own BMI at GravelBags.it.  Cholesterol- Have your cholesterol checked regularly starting at age 56, sooner may be necessary if you have diabetes, high blood pressure, if a family member developed heart diseases at an early age or if you smoke.   Chlamydia, HIV, and other sexual transmitted disease- Get screened each year until the age of 35 then within three months of each new sexual partner.  Diabetes- Have your blood sugar checked regularly if you have high blood pressure, high cholesterol, a family history of diabetes or if you are overweight.  Get these vaccines  Flu shot- Every fall.  Tetanus shot- Every 10 years.  Menactra- Single dose; prevents meningitis.  Take these steps  Don't smoke- If you do smoke, ask your healthcare provider about quitting. For tips on how to quit, go to www.smokefree.gov or call 1-800-QUIT-NOW.  Be physically active- Exercise 5 days a week for at least 30 minutes.  If you are not already physically active start slow and gradually work up to 30 minutes of moderate physical activity.  Examples of moderate activity include walking briskly, mowing the yard, dancing, swimming bicycling, etc.  Eat a healthy diet- Eat a variety of healthy foods such as fruits, vegetables, low fat milk, low fat cheese, yogurt, lean meats, poultry, fish, beans, tofu, etc.  For more information on healthy eating, go to www.thenutritionsource.org  Drink alcohol in moderation- Limit alcohol intake two drinks or less a day.  Never drink and drive.  Dentist- Brush and floss teeth twice daily; visit your dentis twice a year.  Depression-Your emotional health is as important as your physical health.  If you're feeling down, losing interest in things you normally enjoy please talk with your healthcare provider.  Gun Safety- If you keep a gun in your home, keep it unloaded and with  the safety lock on.  Bullets should be stored separately.  Helmet use- Always wear a helmet when riding a motorcycle, bicycle, rollerblading or skateboarding.  Safe sex- If you may be exposed to a sexually transmitted infection, use a condom  Seat belts- Seat bels can save your life; always wear one.  Smoke/Carbon Monoxide detectors- These detectors need to be installed on the appropriate level of your home.  Replace batteries at least once a year.  Skin Cancer- When out in the sun, cover up and use sunscreen SPF 15 or higher.  Violence- If anyone is threatening or hurting you, please tell your healthcare provider.  Back/Neck: How to set up your workstation Chair:  An adjustable chair to fit your height with a slight downward tilt of chair seat is helpful.  Should have good high-back support that assists with keeping the natural curves of your spine.  A "lumbar roll" or pillow at your low back can help you keep good posture.  Adjustable arm rests may decrease strain in upper body.   Keyboard:  Should be close and at elbow or just below elbow height.  Split keyboards can help decrease strain on wrist.  Wrist supports can provide mini-breaks to your wrists throughout the day.  Monitor/Screen:  Top of screen should be at eye level or slightly lower.  Good lighting is important to prevent eye strain, or headaches from glare.  Head Posture: A copy holder on either side of the monitor will help limit neck strain.  Head should not be forward.  Ears should line up with you shoulders.  Leg Posture:  Knee and hips should be bent half-way (about a 90 degree angle).  Shorter people may need something to prop their feet on, with knees bent, like a phone book.  Other Stuff: Keep the stuff (phones, pens, phonebooks, etc) that you use frequently close to you, so you're not straining to reach them.  Hypertension Hypertension, commonly called high blood pressure, is when the force of blood pumping  through your arteries is too strong. Your arteries are the blood vessels that carry blood from your heart throughout your body. A blood pressure reading consists of a higher number over a lower number, such as 110/72. The higher number (systolic) is the pressure inside your arteries when your heart pumps. The lower number (diastolic) is the pressure inside your arteries when your heart relaxes. Ideally you want your blood pressure below 120/80. Hypertension forces your heart to work harder to pump blood. Your arteries may become narrow or stiff. Having untreated or uncontrolled hypertension can cause heart attack, stroke, kidney disease, and other problems. RISK FACTORS Some risk factors for high blood pressure are controllable. Others are not.  Risk factors you cannot control include:  6. Race. You may be at higher risk if you are African American. 7. Age. Risk increases with age. 8. Gender. Men are at higher risk than women before age 63 years. After age 30, women are at higher risk than men. Risk factors you can control include: 4. Not getting enough exercise or physical activity. 5. Being overweight. 6. Getting too much fat, sugar, calories, or salt in your diet. 7. Drinking too much alcohol. SIGNS AND SYMPTOMS Hypertension does not usually cause signs or symptoms. Extremely high blood pressure (hypertensive crisis) may cause headache, anxiety, shortness of breath, and nosebleed. DIAGNOSIS To check if you have hypertension, your health care provider will measure your blood pressure while you are seated, with your arm held at the level of your heart. It should be measured at least twice using the same arm. Certain conditions can cause a difference in blood pressure between your right and left arms. A blood pressure reading that is higher than normal on one occasion does not mean that you need treatment. If it is not clear whether you have high blood pressure, you may be asked to return on a  different day to have your blood pressure checked again. Or, you may be asked to monitor your blood pressure at home for 1 or more weeks. TREATMENT Treating high blood pressure includes making lifestyle changes and possibly taking medicine. Living a healthy lifestyle can help lower high blood pressure. You may need to change some of your habits. Lifestyle changes may include: 14. Following the DASH diet. This diet is high in fruits, vegetables, and whole grains. It is low in salt, red meat, and added sugars. 15. Keep your sodium intake below 2,300 mg per day. 16. Getting at least 30-45 minutes of aerobic exercise at least 4 times per week. 72. Losing weight if necessary. 18. Not smoking. 19. Limiting alcoholic beverages. 20. Learning ways to reduce stress. Your health care provider may prescribe medicine if lifestyle changes are not enough to get your blood pressure under control, and if one of the following is true:  You are 1-58 years of age and your systolic blood pressure is above 140.  You are 68 years of age or older, and your systolic blood pressure is above 150.  Your diastolic blood pressure is above 90.  You have diabetes, and your systolic blood pressure is over [id number] or your diastolic blood pressure is over 90.  You have kidney disease and your blood pressure is above 140/90.  You have heart disease and your blood pressure is above 140/90. Your personal target blood pressure may vary depending on your medical conditions, your age, and other factors. HOME CARE INSTRUCTIONS  Have your blood pressure rechecked as directed by your health care provider.   Take medicines only as directed by your health care provider. Follow the directions carefully. Blood pressure medicines must be taken as prescribed. The medicine does not work as well when you skip doses. Skipping doses also puts you at risk for problems.  Do not smoke.   Monitor your blood pressure at home as directed by  your health care provider. SEEK MEDICAL CARE IF:   You think you are having a reaction to medicines taken.  You have recurrent headaches or feel dizzy.  You have swelling in your ankles.  You have trouble with your vision. SEEK IMMEDIATE MEDICAL CARE IF:  You develop a severe headache or confusion.  You have unusual weakness, numbness, or feel faint.  You have severe chest or abdominal pain.  You vomit repeatedly.  You have trouble breathing. MAKE SURE YOU:   Understand these instructions.  Will watch your condition.  Will get help right away if you are not doing well or get worse.   This information is not intended to replace advice given to you by your health care provider. Make sure you discuss any questions you have with your health care provider.   Document Released: 05/22/2005 Document Revised: 10/06/2014 Document Reviewed: 03/14/2013 Elsevier Interactive Patient Education [employer].     I personally performed the services described in this documentation, which was scribed in my presence. The recorded information has been reviewed and considered, and addended by me as needed.

## 2015-05-18 ENCOUNTER — Other Ambulatory Visit: Payer: Self-pay | Admitting: Family Medicine

## 2015-05-18 LAB — PSA: PSA: 0.39 ng/mL (ref <=4.00)

## 2015-05-29 ENCOUNTER — Other Ambulatory Visit: Payer: Self-pay | Admitting: Family Medicine

## 2015-06-01 NOTE — Telephone Encounter (Signed)
LM How did the flexiril work?  Does he need refill?

## 2015-06-03 NOTE — Telephone Encounter (Signed)
Refilled, but if symptoms persist - return for recheck.

## 2015-06-03 NOTE — Telephone Encounter (Signed)
Pt hasn't called back. Since this was just Rxd once, pt must have asked pharm for refill. Dr Carlota Raspberry, do you want to OK RF or RTC?

## 2015-07-15 ENCOUNTER — Other Ambulatory Visit: Payer: Self-pay | Admitting: Family Medicine

## 2015-07-28 ENCOUNTER — Other Ambulatory Visit: Payer: Self-pay | Admitting: Family Medicine

## 2015-10-29 ENCOUNTER — Ambulatory Visit (INDEPENDENT_AMBULATORY_CARE_PROVIDER_SITE_OTHER): Payer: 59 | Admitting: Urgent Care

## 2015-10-29 DIAGNOSIS — E785 Hyperlipidemia, unspecified: Secondary | ICD-10-CM | POA: Diagnosis not present

## 2015-10-29 DIAGNOSIS — R51 Headache: Secondary | ICD-10-CM

## 2015-10-29 DIAGNOSIS — R9431 Abnormal electrocardiogram [ECG] [EKG]: Secondary | ICD-10-CM | POA: Diagnosis not present

## 2015-10-29 DIAGNOSIS — I10 Essential (primary) hypertension: Secondary | ICD-10-CM

## 2015-10-29 DIAGNOSIS — R519 Headache, unspecified: Secondary | ICD-10-CM

## 2015-10-29 DIAGNOSIS — R0789 Other chest pain: Secondary | ICD-10-CM | POA: Diagnosis not present

## 2015-10-29 LAB — LIPID PANEL
CHOL/HDL RATIO: 4.3 ratio (ref <=5.0)
CHOLESTEROL: 167 mg/dL (ref 125–200)
HDL: 39 mg/dL — AB (ref >=40)
LDL CALC: 104 mg/dL (ref <130)
TRIGLYCERIDES: 118 mg/dL (ref <150)
VLDL: 24 mg/dL (ref <30)

## 2015-10-29 LAB — COMPLETE METABOLIC PANEL WITH GFR
ALT: 19 U/L (ref 9–46)
AST: 17 U/L (ref 10–40)
Albumin: 4.2 g/dL (ref 3.6–5.1)
Alkaline Phosphatase: 58 U/L (ref 40–115)
BUN: 17 mg/dL (ref 7–25)
CALCIUM: 9.1 mg/dL (ref 8.6–10.3)
CHLORIDE: 102 mmol/L (ref 98–110)
CO2: 27 mmol/L (ref 20–31)
CREATININE: 0.95 mg/dL (ref 0.60–1.35)
GFR, Est Non African American: >89 mL/min (ref >=60)
Glucose, Bld: 92 mg/dL (ref 65–99)
POTASSIUM: 4.3 mmol/L (ref 3.5–5.3)
Sodium: 139 mmol/L (ref 135–146)
Total Bilirubin: 0.6 mg/dL (ref 0.2–1.2)
Total Protein: 7.3 g/dL (ref 6.1–8.1)

## 2015-10-29 MED ORDER — LOSARTAN POTASSIUM-HCTZ 100-25 MG PO TABS: 1.0000 | ORAL_TABLET | Freq: Every day | ORAL | Status: DC

## 2015-10-29 MED ORDER — PRAVASTATIN SODIUM 40 MG PO TABS: 40.0000 mg | ORAL_TABLET | Freq: Every day | ORAL | Status: DC

## 2015-10-29 MED ORDER — AMLODIPINE BESYLATE 10 MG PO TABS: 10.0000 mg | ORAL_TABLET | Freq: Every day | ORAL | Status: DC

## 2015-10-29 MED ORDER — ASPIRIN 81 MG PO TABS: 81.0000 mg | ORAL_TABLET | Freq: Every day | ORAL | Status: DC

## 2015-10-29 NOTE — Progress Notes (Signed)
    MRN: [id number] DOB: 07-04-1970  Subjective:   Donald [name] is a 45 y.o. male presenting for chief complaint of Headache and Medication Refill  HTN - Managed with losartan-HCTZ, amlodipine. Reports compliance with medication. Reports frequent temporal headaches associated with neck pain in the past week. Also continues to have intermittent left sided chest pain. Has had a stress test done before in 2015 with Dr. Johnsie Cancel, was normal. Diet is not healthy. Drinks plenty of water. Walks occasionally for exercise. Denies lightheadedness, dizziness, double vision, shortness of breath, heart racing, palpitations, nausea, vomiting, abdominal pain, hematuria, lower leg swelling. Denies smoking cigarettes or drinking alcohol. 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 Is active at work, works 12-14 hours shifts 15 days straight before he takes a break. Admits significant stress with work and at home with his family. He has difficulty sleeping.   HL - Managed with pravastatin. Diet and exercise as above.  Donald [name] has a current medication list which includes the following prescription(s): amlodipine, aspirin, cyclobenzaprine, losartan-hydrochlorothiazide, and pravastatin. Also is allergic to ace inhibitors.  [name]  has a past medical history of Hyperlipidemia; Hypertension; Hypercholesterolemia; Right bundle branch block; PARASOMNIA; NEPHROLITHIASIS, HX OF; Lumbago; G E R D; Dysuria; and DEPRESSION, MILD. Also  has past surgical history that includes right and left hand and Hand surgery.  His family history includes Hypertension in his mother and sister. Father has a history of stroke.  Objective:   Vitals: There were no vitals taken for this visit.  Physical Exam  Constitutional: He is oriented to person, place, and time. He appears well-developed and well-nourished.  HENT:  Mouth/Throat: Oropharynx is clear and moist.  Eyes: Right eye exhibits no discharge. Left eye exhibits no  discharge. No scleral icterus.  Neck: Normal range of motion. Neck supple. No thyromegaly present.  Cardiovascular: Normal rate, regular rhythm and intact distal pulses.  Exam reveals no gallop and no friction rub.   No murmur heard. Pulmonary/Chest: No respiratory distress. He has no wheezes. He has no rales.  Abdominal: Soft. Bowel sounds are normal. He exhibits no distension and no mass. There is no tenderness.  Musculoskeletal: He exhibits no edema or tenderness.  Neurological: He is alert and oriented to person, place, and time.  Skin: Skin is warm and dry.  Psychiatric: His mood appears anxious.   ECG interpretation - Non-specific ECG changes in Leads III, aVF, V3 compared to ECG from 08/2013.   Assessment and Plan :   1. Essential hypertension - Labs pending, controlled HTN. Continue current regimen.  2. Hyperlipidemia - Labs pending. Refill provided.  3. Headache, unspecified headache type - Likely undergoing stress headaches. Advised lifestyle modifications, reconsider work hours and work schedule.   4. Atypical chest pain 5. Nonspecific abnormal electrocardiogram (ECG) (EKG) - Referral back to his cardiologist. Stable for now, switch over to low dose aspirin. Counseled on concerning symptoms of ACS, report to ED if this is the case.  Donald Eagles, PA-C [facility] [phone] 10/29/2015 9:24 AM

## 2015-10-29 NOTE — Patient Instructions (Addendum)
Nonspecific Chest Pain   Chest pain can be caused by many different conditions. There is always a chance that your pain could be related to something serious, such as a heart attack or a blood clot in your lungs. Chest pain can also be caused by conditions that are not life-threatening. If you have chest pain, it is very important to follow up with your health care provider.  CAUSES   Chest pain can be caused by:   Heartburn.   Pneumonia or bronchitis.   Anxiety or stress.   Inflammation around your heart (pericarditis) or lung (pleuritis or pleurisy).   A blood clot in your lung.   A collapsed lung (pneumothorax). It can develop suddenly on its own (spontaneous pneumothorax) or from trauma to the chest.   Shingles infection (varicella-zoster virus).   Heart attack.   Damage to the bones, muscles, and cartilage that make up your chest wall. This can include:    Bruised bones due to injury.    Strained muscles or cartilage due to frequent or repeated coughing or overwork.    Fracture to one or more ribs.    Sore cartilage due to inflammation (costochondritis).  RISK FACTORS   Risk factors for chest pain may include:   Activities that increase your risk for trauma or injury to your chest.   Respiratory infections or conditions that cause frequent coughing.   Medical conditions or overeating that can cause heartburn.   Heart disease or family history of heart disease.   Conditions or health behaviors that increase your risk of developing a blood clot.   Having had chicken pox (varicella zoster).  SIGNS AND SYMPTOMS  Chest pain can feel like:   Burning or tingling on the surface of your chest or deep in your chest.   Crushing, pressure, aching, or squeezing pain.   Dull or sharp pain that is worse when you move, cough, or take a deep breath.   Pain that is also felt in your back, neck, shoulder, or arm, or pain that spreads to any of these areas.  Your chest pain may come and go, or it may stay  constant.  DIAGNOSIS  Lab tests or other studies may be needed to find the cause of your pain. Your health care provider may have you take a test called an ambulatory ECG (electrocardiogram). An ECG records your heartbeat patterns at the time the test is performed. You may also have other tests, such as:   Transthoracic echocardiogram (TTE). During echocardiography, sound waves are used to create a picture of all of the heart structures and to look at how blood flows through your heart.   Transesophageal echocardiogram (TEE).This is a more advanced imaging test that obtains images from inside your body. It allows your health care provider to see your heart in finer detail.   Cardiac monitoring. This allows your health care provider to monitor your heart rate and rhythm in real time.   Holter monitor. This is a portable device that records your heartbeat and can help to diagnose abnormal heartbeats. It allows your health care provider to track your heart activity for several days, if needed.   Stress tests. These can be done through exercise or by taking medicine that makes your heart beat more quickly.   Blood tests.   Imaging tests.  TREATMENT   Your treatment depends on what is causing your chest pain. Treatment may include:   Medicines. These may include:    Acid blockers for   or cold packs to injured areas.  Limiting activities until pain decreases. HOME CARE INSTRUCTIONS  If you were prescribed an antibiotic medicine, finish it all even if you start to feel better.  Avoid any activities that bring on chest pain.  Do not use any tobacco products, including  cigarettes, chewing tobacco, or electronic cigarettes. If you need help quitting, ask your health care provider.  Do not drink alcohol.  Take medicines only as directed by your health care provider.  Keep all follow-up visits as directed by your health care provider. This is important. This includes any further testing if your chest pain does not go away.  If heartburn is the cause for your chest pain, you may be told to keep your head raised (elevated) while sleeping. This reduces the chance that acid will go from your stomach into your esophagus.  Make lifestyle changes as directed by your health care provider. These may include:  Getting regular exercise. Ask your health care provider to suggest some activities that are safe for you.  Eating a heart-healthy diet. A registered dietitian can help you to learn healthy eating options.  Maintaining a healthy weight.  Managing diabetes, if necessary.  Reducing stress. SEEK MEDICAL CARE IF:  Your chest pain does not go away after treatment.  You have a rash with blisters on your chest.  You have a fever. SEEK IMMEDIATE MEDICAL CARE IF:   Your chest pain is worse.  You have an increasing cough, or you cough up blood.  You have severe abdominal pain.  You have severe weakness.  You faint.  You have chills.  You have sudden, unexplained chest discomfort.  You have sudden, unexplained discomfort in your arms, back, neck, or jaw.  You have shortness of breath at any time.  You suddenly start to sweat, or your skin gets clammy.  You feel nauseous or you vomit.  You suddenly feel light-headed or dizzy.  Your heart begins to beat quickly, or it feels like it is skipping beats. These symptoms may represent a serious problem that is an emergency. Do not wait to see if the symptoms will go away. Get medical help right away. Call your local emergency services (911 in the [place] Do not drive yourself to the hospital.   This  information is not intended to replace advice given to you by your health care provider. Make sure you discuss any questions you have with your health care provider.   Document Released: 03/01/2005 Document Revised: 06/12/2014 Document Reviewed: 12/26/2013 Elsevier Interactive Patient Education 2016 [employer].     IF you received an x-ray today, you will receive an invoice from [facility]. Please contact [facility] at [phone] with questions or concerns regarding your invoice.   IF you received labwork today, you will receive an invoice from [employer]. Please contact [facility] at [phone] with questions or concerns regarding your invoice.   Our billing staff will not be able to assist you with questions regarding bills from these companies.  You will be contacted with the lab results as soon as they are available. The fastest way to get your results is to activate your My Chart account. Instructions are located on the last page of this paperwork. If you have not heard from [place] regarding the results in 2 weeks, please contact this office.

## 2015-11-02 ENCOUNTER — Encounter: Payer: Self-pay | Admitting: Urgent Care

## 2015-11-09 ENCOUNTER — Other Ambulatory Visit: Payer: Self-pay | Admitting: Family Medicine

## 2015-11-22 ENCOUNTER — Ambulatory Visit: Payer: Self-pay | Admitting: Cardiovascular Disease

## 2015-11-28 ENCOUNTER — Other Ambulatory Visit: Payer: Self-pay | Admitting: Family Medicine

## 2015-12-03 ENCOUNTER — Ambulatory Visit (INDEPENDENT_AMBULATORY_CARE_PROVIDER_SITE_OTHER): Payer: 59 | Admitting: Cardiology

## 2015-12-03 ENCOUNTER — Encounter: Payer: Self-pay | Admitting: Cardiology

## 2015-12-03 VITALS — BP 130/82 | HR 84 | Ht 72.0 in | Wt 258.0 lb

## 2015-12-03 DIAGNOSIS — R079 Chest pain, unspecified: Secondary | ICD-10-CM

## 2015-12-03 NOTE — Patient Instructions (Signed)
Medication Instructions:  None  Labwork: None  Testing/Procedures: Your physician has requested that you have en exercise stress myoview. For further information please visit [email or web address]. Please follow instruction sheet, as given.   Follow-Up: Your physician recommends that you schedule a follow-up appointment with a PA or NP shortly after your stress test.   Any Other Special Instructions Will Be Listed Below (If Applicable).     If you need a refill on your cardiac medications before your next appointment, please call your pharmacy.

## 2015-12-03 NOTE — Progress Notes (Signed)
12/03/2015 Donald [name]   1970/10/29  [id number]  Primary Physician No PCP Per Patient Primary Cardiologist: Dr. Johnsie Cancel   Reason for Visit/CC: Chest Pain  HPI:  45 y/o male, followed by Dr. Johnsie Cancel, with a h/o treated HTN and HLD. He is followed medically by Dr. Corliss Parish. No known h/o CAD. No family h/o CAD nor SCD. He smoked for ~2 years but quit 15 years ago.   He presents to clinic as recommended by Dr. Sara Chu, given recent development of CP. This has been occuring off and on for the last several weeks. Described as left sided upper chest pain radiating to his left shoulder blade. Described as a burning sensation. Episodes can last up to 30 min at at time. No associated dyspnea, diphoresis, dizzness, n/v nor syncope. Worsened by exertion. Not worse with meals. His pain improves with muscle relaxers.   He is currently CP free. EKG shows RBBB. NSR. BP is well controlled at 130/82. He notes full medication compliance.    Current Outpatient Prescriptions  Medication Sig Dispense Refill  . amLODipine (NORVASC) 10 MG tablet Take 1 tablet (10 mg total) by mouth daily. 90 tablet 1  . aspirin 81 MG tablet Take 1 tablet (81 mg total) by mouth daily. 30 tablet 5  . losartan-hydrochlorothiazide (HYZAAR) 100-25 MG tablet Take 1 tablet by mouth daily. 90 tablet 1  . pravastatin (PRAVACHOL) 40 MG tablet Take 1 tablet (40 mg total) by mouth daily. 90 tablet 1   No current facility-administered medications for this visit.    Allergies  Allergen Reactions  . Ace Inhibitors Cough    Lisinopril caused cough    Social History   Social History  . Marital Status: Single    Spouse Name: N/A  . Number of Children: 2  . Years of Education: 15   Occupational History  . 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 Smoking status: Never Smoker   . Smokeless tobacco: Never Used  . Alcohol Use: No  . Drug Use: No  . Sexual Activity: Not on file   Other Topics  Concern  . Not on file   Social History Narrative   ** Merged History Encounter **         Review of Systems: General: negative for chills, fever, night sweats or weight changes.  Cardiovascular: negative for chest pain, dyspnea on exertion, edema, orthopnea, palpitations, paroxysmal nocturnal dyspnea or shortness of breath Dermatological: negative for rash Respiratory: negative for cough or wheezing Urologic: negative for hematuria Abdominal: negative for nausea, vomiting, diarrhea, bright red blood per rectum, melena, or hematemesis Neurologic: negative for visual changes, syncope, or dizziness All other systems reviewed and are otherwise negative except as noted above.    Blood pressure 130/82, pulse 84, height 6' (1.829 m), weight 258 lb (117.028 kg).  General appearance: alert, cooperative and no distress Neck: no carotid bruit and no JVD Lungs: clear to auscultation bilaterally Heart: regular rate and rhythm, S1, S2 normal, no murmur, click, rub or gallop Extremities: no LEE Pulses: 2+ and symmetric Skin: warm and dry Neurologic: Grossly normal  EKG NSR. RBBB. 84 bpm   ASSESSMENT AND PLAN:   1. Chest Pain: mixed typical + atypical features. However he has CRF including HTN and HLD. EKG shows NSR with RBBB. We will plan for an exercise NST to risk stratify. Continue medical therapy for HTN and HLD.   2. HTN: followed by his PCP. Well controlled at 130/82. Continue  amlodipine + Hyzaar.   3. HLD: decently controlled with Pravastatin. Lipid profile is followed by his PCP. Recent LP showed LDL of 104 mg/dL.   PLAN  F/u in 2 weeks after stress test.   Lyda Jester PA-C 12/03/2015 9:41 AM

## 2015-12-14 ENCOUNTER — Telehealth (HOSPITAL_COMMUNITY): Payer: Self-pay | Admitting: *Deleted

## 2015-12-14 NOTE — Telephone Encounter (Signed)
Attempted to call patient regarding upcoming appointment- no answer. Anaka Beazer J Rafael Salway, RN 

## 2015-12-17 ENCOUNTER — Encounter (HOSPITAL_COMMUNITY): Payer: Self-pay

## 2015-12-27 ENCOUNTER — Ambulatory Visit: Payer: 59 | Admitting: Physician Assistant

## 2015-12-28 ENCOUNTER — Encounter: Payer: Self-pay | Admitting: Physician Assistant

## 2015-12-31 ENCOUNTER — Encounter (HOSPITAL_COMMUNITY): Payer: Self-pay

## 2016-01-18 ENCOUNTER — Telehealth (HOSPITAL_COMMUNITY): Payer: Self-pay | Admitting: *Deleted

## 2016-01-18 NOTE — Telephone Encounter (Signed)
Left message on voicemail through telephone interpreter [place] [id number] in reference to upcoming appointment scheduled for 01/21/16. Phone number given for a call back so details instructions can be given. Donald [name], Donald [name]

## 2016-01-19 ENCOUNTER — Encounter (HOSPITAL_COMMUNITY): Payer: Self-pay | Admitting: *Deleted

## 2016-01-19 ENCOUNTER — Encounter (HOSPITAL_COMMUNITY)
Admission: EM | Disposition: A | Discharge status: Home / Self Care | Payer: Self-pay | Source: Home / Self Care | Attending: Cardiovascular Disease

## 2016-01-19 ENCOUNTER — Telehealth (HOSPITAL_COMMUNITY): Payer: Self-pay | Admitting: *Deleted

## 2016-01-19 ENCOUNTER — Observation Stay (HOSPITAL_COMMUNITY)
Admission: EM | Admit: 2016-01-19 | Discharge: 2016-01-20 | Disposition: A | Discharge status: Home / Self Care | Payer: 59 | Attending: Cardiovascular Disease | Admitting: Cardiovascular Disease

## 2016-01-19 ENCOUNTER — Ambulatory Visit (HOSPITAL_COMMUNITY): Admit: 2016-01-19 | Payer: Self-pay | Admitting: Cardiovascular Disease

## 2016-01-19 DIAGNOSIS — I2510 Atherosclerotic heart disease of native coronary artery without angina pectoris: Secondary | ICD-10-CM | POA: Diagnosis not present

## 2016-01-19 DIAGNOSIS — I4510 Unspecified right bundle-branch block: Secondary | ICD-10-CM | POA: Diagnosis not present

## 2016-01-19 DIAGNOSIS — J069 Acute upper respiratory infection, unspecified: Secondary | ICD-10-CM | POA: Insufficient documentation

## 2016-01-19 DIAGNOSIS — R071 Chest pain on breathing: Secondary | ICD-10-CM | POA: Diagnosis not present

## 2016-01-19 DIAGNOSIS — E785 Hyperlipidemia, unspecified: Secondary | ICD-10-CM | POA: Diagnosis not present

## 2016-01-19 DIAGNOSIS — R079 Chest pain, unspecified: Secondary | ICD-10-CM

## 2016-01-19 DIAGNOSIS — Z7982 Long term (current) use of aspirin: Secondary | ICD-10-CM | POA: Diagnosis not present

## 2016-01-19 DIAGNOSIS — F329 Major depressive disorder, single episode, unspecified: Secondary | ICD-10-CM | POA: Diagnosis not present

## 2016-01-19 DIAGNOSIS — I10 Essential (primary) hypertension: Secondary | ICD-10-CM | POA: Insufficient documentation

## 2016-01-19 DIAGNOSIS — R0789 Other chest pain: Secondary | ICD-10-CM | POA: Diagnosis present

## 2016-01-19 DIAGNOSIS — R05 Cough: Secondary | ICD-10-CM

## 2016-01-19 DIAGNOSIS — Z87442 Personal history of urinary calculi: Secondary | ICD-10-CM | POA: Diagnosis not present

## 2016-01-19 DIAGNOSIS — E876 Hypokalemia: Secondary | ICD-10-CM | POA: Diagnosis not present

## 2016-01-19 DIAGNOSIS — R059 Cough, unspecified: Secondary | ICD-10-CM

## 2016-01-19 HISTORY — PX: CARDIAC CATHETERIZATION: SHX172

## 2016-01-19 LAB — CBC
HCT: 40.4 % (ref 39.0–52.0)
HEMATOCRIT: 37.9 % — AB (ref 39.0–52.0)
Hemoglobin: 13.4 g/dL (ref 13.0–17.0)
Hemoglobin: 12.3 g/dL — ABNORMAL LOW (ref 13.0–17.0)
MCH: 27.7 pg (ref 26.0–34.0)
MCH: 27 pg (ref 26.0–34.0)
MCHC: 33.2 g/dL (ref 30.0–36.0)
MCHC: 32.5 g/dL (ref 30.0–36.0)
MCV: 83.6 fL (ref 78.0–100.0)
MCV: 83.1 fL (ref 78.0–100.0)
PLATELETS: 335 10*3/uL (ref 150–400)
Platelets: 285 10*3/uL (ref 150–400)
RBC: 4.83 MIL/uL (ref 4.22–5.81)
RBC: 4.56 MIL/uL (ref 4.22–5.81)
RDW: 14.4 % (ref 11.5–15.5)
RDW: 14.6 % (ref 11.5–15.5)
WBC: 8.7 10*3/uL (ref 4.0–10.5)
WBC: 6.9 10*3/uL (ref 4.0–10.5)

## 2016-01-19 LAB — POCT I-STAT, CHEM 8
BUN: 17 mg/dL (ref 6–20)
CALCIUM ION: 1.17 mmol/L (ref 1.13–1.30)
CHLORIDE: 102 mmol/L (ref 101–111)
CREATININE: 1 mg/dL (ref 0.61–1.24)
GLUCOSE: 129 mg/dL — AB (ref 65–99)
HCT: 39 % (ref 39.0–52.0)
Hemoglobin: 13.3 g/dL (ref 13.0–17.0)
Potassium: 2.7 mmol/L — CL (ref 3.5–5.1)
SODIUM: 142 mmol/L (ref 135–145)
TCO2: 23 mmol/L (ref 0–100)

## 2016-01-19 LAB — CK TOTAL AND CKMB (NOT AT ARMC)
CK TOTAL: 236 U/L (ref 49–397)
CK, MB: 3.6 ng/mL (ref 0.5–5.0)
Relative Index: 1.5 (ref 0.0–2.5)

## 2016-01-19 LAB — APTT: APTT: 31 s (ref 24–36)

## 2016-01-19 LAB — COMPREHENSIVE METABOLIC PANEL
ALBUMIN: 4.1 g/dL (ref 3.5–5.0)
ALK PHOS: 54 U/L (ref 38–126)
ALT: 23 U/L (ref 17–63)
AST: 27 U/L (ref 15–41)
Anion gap: 11 (ref 5–15)
BILIRUBIN TOTAL: 0.6 mg/dL (ref 0.3–1.2)
BUN: 15 mg/dL (ref 6–20)
CALCIUM: 9.4 mg/dL (ref 8.9–10.3)
CO2: 23 mmol/L (ref 22–32)
CREATININE: 1.12 mg/dL (ref 0.61–1.24)
Chloride: 105 mmol/L (ref 101–111)
GFR calc Af Amer: >60 mL/min (ref >60)
GFR calc non Af Amer: >60 mL/min (ref >60)
GLUCOSE: 119 mg/dL — AB (ref 65–99)
Potassium: 2.8 mmol/L — ABNORMAL LOW (ref 3.5–5.1)
Sodium: 139 mmol/L (ref 135–145)
TOTAL PROTEIN: 8 g/dL (ref 6.5–8.1)

## 2016-01-19 LAB — PROTIME-INR
INR: 1.05
PROTHROMBIN TIME: 13.8 s (ref 11.4–15.2)

## 2016-01-19 LAB — LIPID PANEL
Cholesterol: 175 mg/dL (ref 0–200)
HDL: 36 mg/dL — ABNORMAL LOW (ref >40)
LDL Cholesterol: 106 mg/dL — ABNORMAL HIGH (ref 0–99)
Total CHOL/HDL Ratio: 4.9 RATIO
Triglycerides: 164 mg/dL — ABNORMAL HIGH (ref <150)
VLDL: 33 mg/dL (ref 0–40)

## 2016-01-19 LAB — CREATININE, SERUM
CREATININE: 1.05 mg/dL (ref 0.61–1.24)
GFR calc Af Amer: >60 mL/min (ref >60)

## 2016-01-19 LAB — TROPONIN I: Troponin I: <0.03 ng/mL (ref <0.03)

## 2016-01-19 SURGERY — LEFT HEART CATH AND CORONARY ANGIOGRAPHY
Anesthesia: LOCAL

## 2016-01-19 MED ORDER — FENTANYL CITRATE (PF) 100 MCG/2ML IJ SOLN
INTRAMUSCULAR | Status: AC
  Filled 2016-01-19: qty 2

## 2016-01-19 MED ORDER — ONDANSETRON HCL 4 MG/2ML IJ SOLN: 4.0000 mg | Freq: Four times a day (QID) | INTRAMUSCULAR | Status: DC | PRN

## 2016-01-19 MED ORDER — SALINE SPRAY 0.65 % NA SOLN
1.0000 | NASAL | Status: DC | PRN
  Administered 2016-01-19: 1 via NASAL
  Filled 2016-01-19: qty 44

## 2016-01-19 MED ORDER — POTASSIUM CHLORIDE CRYS ER 20 MEQ PO TBCR
40.0000 meq | EXTENDED_RELEASE_TABLET | Freq: Once | ORAL | Status: AC
  Administered 2016-01-19: 40 meq via ORAL
  Filled 2016-01-19: qty 2

## 2016-01-19 MED ORDER — LOSARTAN POTASSIUM 50 MG PO TABS
100.0000 mg | ORAL_TABLET | Freq: Every day | ORAL | Status: DC
  Administered 2016-01-20: 09:00:00 100 mg via ORAL
  Filled 2016-01-19: qty 2

## 2016-01-19 MED ORDER — POTASSIUM CHLORIDE CRYS ER 20 MEQ PO TBCR
40.0000 meq | EXTENDED_RELEASE_TABLET | Freq: Once | ORAL | Status: AC
  Administered 2016-01-19: 22:00:00 40 meq via ORAL
  Filled 2016-01-19: qty 2

## 2016-01-19 MED ORDER — AMLODIPINE BESYLATE 10 MG PO TABS
10.0000 mg | ORAL_TABLET | Freq: Every day | ORAL | Status: DC
  Administered 2016-01-20: 10 mg via ORAL
  Filled 2016-01-19: qty 1

## 2016-01-19 MED ORDER — ACETAMINOPHEN 325 MG PO TABS: 650.0000 mg | ORAL_TABLET | ORAL | Status: DC | PRN

## 2016-01-19 MED ORDER — SODIUM CHLORIDE 0.9 % WEIGHT BASED INFUSION: 3.0000 mL/kg/h | INTRAVENOUS | Status: AC

## 2016-01-19 MED ORDER — HEPARIN (PORCINE) IN NACL 2-0.9 UNIT/ML-% IJ SOLN
INTRAMUSCULAR | Status: AC
  Filled 2016-01-19: qty 1000

## 2016-01-19 MED ORDER — FENTANYL CITRATE (PF) 100 MCG/2ML IJ SOLN
INTRAMUSCULAR | Status: DC | PRN
  Administered 2016-01-19 (×2): 25 ug via INTRAVENOUS

## 2016-01-19 MED ORDER — MIDAZOLAM HCL 2 MG/2ML IJ SOLN
INTRAMUSCULAR | Status: AC
  Filled 2016-01-19: qty 2

## 2016-01-19 MED ORDER — SODIUM CHLORIDE 0.9 % WEIGHT BASED INFUSION: 3.0000 mL/kg/h | INTRAVENOUS | Status: DC

## 2016-01-19 MED ORDER — VERAPAMIL HCL 2.5 MG/ML IV SOLN
INTRAVENOUS | Status: DC | PRN
  Administered 2016-01-19: 10 mL via INTRA_ARTERIAL

## 2016-01-19 MED ORDER — HEPARIN (PORCINE) IN NACL 2-0.9 UNIT/ML-% IJ SOLN
INTRAMUSCULAR | Status: DC | PRN
  Administered 2016-01-19: 1500 mL

## 2016-01-19 MED ORDER — IOPAMIDOL (ISOVUE-370) INJECTION 76%
INTRAVENOUS | Status: AC
  Filled 2016-01-19: qty 125

## 2016-01-19 MED ORDER — SODIUM CHLORIDE 0.9 % IV SOLN: 250.0000 mL | INTRAVENOUS | Status: DC | PRN

## 2016-01-19 MED ORDER — ENOXAPARIN SODIUM 40 MG/0.4ML ~~LOC~~ SOLN
40.0000 mg | SUBCUTANEOUS | Status: DC
  Filled 2016-01-19: qty 0.4

## 2016-01-19 MED ORDER — NITROGLYCERIN 1 MG/10 ML FOR IR/CATH LAB
INTRA_ARTERIAL | Status: AC
  Filled 2016-01-19: qty 10

## 2016-01-19 MED ORDER — VERAPAMIL HCL 2.5 MG/ML IV SOLN
INTRAVENOUS | Status: AC
  Filled 2016-01-19: qty 2

## 2016-01-19 MED ORDER — HYDROCHLOROTHIAZIDE 25 MG PO TABS
25.0000 mg | ORAL_TABLET | Freq: Every day | ORAL | Status: DC
  Administered 2016-01-20: 09:00:00 25 mg via ORAL
  Filled 2016-01-19: qty 1

## 2016-01-19 MED ORDER — HEPARIN SODIUM (PORCINE) 1000 UNIT/ML IJ SOLN
INTRAMUSCULAR | Status: DC | PRN
  Administered 2016-01-19: 5000 [IU] via INTRAVENOUS

## 2016-01-19 MED ORDER — NITROGLYCERIN 0.4 MG SL SUBL: 0.4000 mg | SUBLINGUAL_TABLET | SUBLINGUAL | Status: DC | PRN

## 2016-01-19 MED ORDER — SODIUM CHLORIDE 0.9% FLUSH: 3.0000 mL | Freq: Two times a day (BID) | INTRAVENOUS | Status: DC

## 2016-01-19 MED ORDER — ASPIRIN 81 MG PO CHEW
81.0000 mg | CHEWABLE_TABLET | Freq: Every day | ORAL | Status: DC
  Administered 2016-01-20: 09:00:00 81 mg via ORAL
  Filled 2016-01-19: qty 1

## 2016-01-19 MED ORDER — MIDAZOLAM HCL 2 MG/2ML IJ SOLN
INTRAMUSCULAR | Status: DC | PRN
  Administered 2016-01-19 (×2): 2 mg via INTRAVENOUS

## 2016-01-19 MED ORDER — LOSARTAN POTASSIUM-HCTZ 100-25 MG PO TABS: 1.0000 | ORAL_TABLET | Freq: Every day | ORAL | Status: DC

## 2016-01-19 MED ORDER — LIDOCAINE HCL (PF) 1 % IJ SOLN
INTRAMUSCULAR | Status: AC
  Filled 2016-01-19: qty 30

## 2016-01-19 MED ORDER — GUAIFENESIN 100 MG/5ML PO SOLN
15.0000 mL | Freq: Four times a day (QID) | ORAL | Status: DC | PRN
  Administered 2016-01-19: 300 mg via ORAL
  Filled 2016-01-19: qty 15

## 2016-01-19 MED ORDER — IOPAMIDOL (ISOVUE-370) INJECTION 76%
INTRAVENOUS | Status: DC | PRN
  Administered 2016-01-19: 105 mL via INTRAVENOUS

## 2016-01-19 MED ORDER — OXYCODONE-ACETAMINOPHEN 5-325 MG PO TABS: 1.0000 | ORAL_TABLET | ORAL | Status: DC | PRN

## 2016-01-19 MED ORDER — SODIUM CHLORIDE 0.9% FLUSH: 3.0000 mL | INTRAVENOUS | Status: DC | PRN

## 2016-01-19 MED ORDER — LIDOCAINE HCL (PF) 1 % IJ SOLN
INTRAMUSCULAR | Status: DC | PRN
  Administered 2016-01-19: 2 mL

## 2016-01-19 MED ORDER — PRAVASTATIN SODIUM 40 MG PO TABS
40.0000 mg | ORAL_TABLET | Freq: Every day | ORAL | Status: DC
  Administered 2016-01-19 – 2016-01-20 (×2): 40 mg via ORAL
  Filled 2016-01-19 (×2): qty 1

## 2016-01-19 SURGICAL SUPPLY — 12 items
CATH INFINITI 5 FR AR1 MOD (CATHETERS) ×2 IMPLANT
CATH INFINITI 5 FR JL3.5 (CATHETERS) ×1 IMPLANT
CATH INFINITI 5FR ANG PIGTAIL (CATHETERS) ×1 IMPLANT
CATH INFINITI JR4 5F (CATHETERS) ×1 IMPLANT
DEVICE RAD COMP TR BAND LRG (VASCULAR PRODUCTS) ×1 IMPLANT
GLIDESHEATH SLEND SS 6F .021 (SHEATH) ×1 IMPLANT
KIT HEART LEFT (KITS) ×2 IMPLANT
PACK CARDIAC CATHETERIZATION (CUSTOM PROCEDURE TRAY) ×2 IMPLANT
SYR MEDRAD MARK V 150ML (SYRINGE) ×2 IMPLANT
TRANSDUCER W/STOPCOCK (MISCELLANEOUS) ×2 IMPLANT
TUBING CIL FLEX 10 FLL-RA (TUBING) ×2 IMPLANT
WIRE SAFE-T 1.5MM-J .035X260CM (WIRE) ×1 IMPLANT

## 2016-01-19 NOTE — H&P (Signed)
History and Physical  Patient ID: Donald [name] MRN: [id number], SOB: Jan 16, 1971 45 y.o. Date of Encounter: 01/19/2016, 7:00 PM  Primary Physician: No PCP Per Patient Primary Cardiologist: Dr Johnsie Cancel  Chief Complaint: Chest pain  HPI: 45 y.o. male w/ PMHx significant for HTN, hyperlipidemia who presented to [facility]/19/2016 with complaints of chest pain.  The patient has experienced a recent upper respiratory illness with chest and sinus congestion. He has complained of diaphoresis. He took a decongestant today and began to experience dull substernal chest pain and tachycardia palpitations. EMS was called and initially he was given nebulizer treatment. His EKG showed a right bundle branch block with anteroseptal ST elevation concerning for STEMI. A code STEMI was paged out and the patient is brought directly to the cardiac catheterization lab.  The patient complains of 2/10 ongoing dull chest discomfort, worse with cough. He complains of shortness of breath and diaphoresis. No nausea or vomiting. Also complains of congestion. No previous history of cardiac disease. He is scheduled for an upcoming stress test to evaluate chest pain that he has experienced in the past.   Past Medical History:  Diagnosis Date  . DEPRESSION, MILD   . Dysuria   . G E R D   . Hypercholesterolemia   . Hyperlipidemia    NOT TAKING MEDICATION  . Hypertension   . Lumbago   . NEPHROLITHIASIS, HX OF   . PARASOMNIA   . Right bundle branch block      Surgical History:  Past Surgical History:  Procedure Laterality Date  . HAND SURGERY    . right and left hand       Home Meds: Prior to Admission medications   Medication Sig Start Date End Date Taking? Authorizing Provider  amLODipine (NORVASC) 10 MG tablet Take 1 tablet (10 mg total) by mouth daily. 10/29/15   Jaynee Eagles, PA-C  aspirin 81 MG tablet Take 1 tablet (81 mg total) by mouth daily. 10/29/15   Jaynee Eagles, PA-C    losartan-hydrochlorothiazide (HYZAAR) 100-25 MG tablet Take 1 tablet by mouth daily. 10/29/15   Jaynee Eagles, PA-C  pravastatin (PRAVACHOL) 40 MG tablet Take 1 tablet (40 mg total) by mouth daily. 10/29/15   Jaynee Eagles, PA-C    Allergies:  Allergies  Allergen Reactions  . Ace Inhibitors Cough    Lisinopril caused cough    Social History   Social History  . Marital status: Single    Spouse name: N/A  . Number of children: 2  . 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 Smoking status: Never Smoker  . Smokeless tobacco: Never Used  . Alcohol use No  . Drug use: No  . Sexual activity: Not on file   Other Topics Concern  . Not on file   Social History Narrative   ** Merged History Encounter **         Family History  Problem Relation Age of Onset  . Hypertension Mother   . Hypertension Sister     Review of Systems: General: negative for chills, fever, night sweats or weight changes.  ENT: positive for congestion, rhinorrhea Cardiovascular: see HPI Dermatological: negative for rash Respiratory: negative for cough or wheezing GI: negative for nausea, vomiting, diarrhea, bright red blood per rectum, melena, or hematemesis GU: no hematuria, urgency, or frequency Neurologic: negative for visual changes, syncope, headache, or dizziness Heme: no  easy bruising or bleeding Endo: negative for excessive thirst, thyroid disorder, or flushing Musculoskeletal: negative for joint pain or swelling, negative for myalgias All other systems reviewed and are otherwise negative except as noted above.  Physical Exam: Blood pressure 124/75, pulse 96, resp. rate 12, SpO2 98 %. General: Well developed, well nourished, alert and oriented, in no acute distress. HEENT: Normocephalic, atraumatic, sclera anicteric Neck: Supple. Carotids 2+ without bruits. JVP normal Lungs: Clear bilaterally to auscultation without  wheezes, rales, or rhonchi. Breathing is unlabored. Heart: tachycardic and regular with normal S1 and S2. No murmurs, rubs, or gallops appreciated. Abdomen: Soft, non-tender, non-distended with normoactive bowel sounds. No hepatomegaly. No rebound/guarding. No obvious abdominal masses. Back: No CVA tenderness Msk:  Strength and tone appear normal for age. Extremities: No clubbing, cyanosis, or edema.  Distal pedal pulses are 2+ and equal bilaterally. Neuro: CNII-XII intact, moves all extremities spontaneously. Psych:  Responds to questions appropriately with a normal affect. Skin: warm and dry without rash   Labs:   Lab Results  Component Value Date   WBC 8.7 01/19/2016   HGB 13.3 01/19/2016   HCT 39.0 01/19/2016   MCV 83.6 01/19/2016   PLT 335 01/19/2016    Recent Labs Lab 01/19/16 1833  NA 142  K 2.7*  CL 102  BUN 17  CREATININE 1.00  GLUCOSE 129*   No results for input(s): CKTOTAL, CKMB, TROPONINI in the last 72 hours. Lab Results  Component Value Date   CHOL 167 10/29/2015   HDL 39 (L) 10/29/2015   LDLCALC 104 10/29/2015   TRIG 118 10/29/2015   No results found for: DDIMER  Radiology/Studies:  No results found.   EKG: sinus tach 110 bpm, RBBB, subtle anterior-septal ST elevation  CARDIAC STUDIES: pending  ASSESSMENT AND PLAN:  1. Chest pain at rest, possible STEMI: Baseline RBBB on old EKG tracing, but more significant ST deviation on current tracing. With ongoing chest discomfort and diaphoresis, favor emergent cath/possible PCI to evaluate for STEMI. Emergency consent obtained.  2. URI: check CXR. Consider empiric antibiotics depending clinical course and CXR findings.  3. HTN: adjust antihypertensive Rx pending cath findings.  4. Hyperlipidemia: treat with statin drug.  Deatra James MD 01/19/2016, 7:00 PM

## 2016-01-19 NOTE — Telephone Encounter (Signed)
Phone interpreter used to leave message on voicemail in reference to upcoming appointment scheduled for 01/21/16.Interpreter name;Mahmoud [id number]. Phone number given for a call back so details instructions can be given. Glennda Weatherholtz, Ranae Palms

## 2016-01-19 NOTE — Progress Notes (Signed)
TR BAND REMOVAL  LOCATION:    right radial  DEFLATED PER PROTOCOL:    Yes.    TIME BAND OFF / DRESSING APPLIED:    23:15   SITE UPON ARRIVAL:    Level 0  SITE AFTER BAND REMOVAL:    Level 0  CIRCULATION SENSATION AND MOVEMENT:    Within Normal Limits   Yes.    COMMENTS:   TR band instructions given. No hematoma; no bleeding. Pt tolerated well.

## 2016-01-20 ENCOUNTER — Encounter (HOSPITAL_COMMUNITY): Payer: Self-pay | Admitting: Cardiovascular Disease

## 2016-01-20 ENCOUNTER — Observation Stay (HOSPITAL_COMMUNITY): Payer: 59

## 2016-01-20 ENCOUNTER — Encounter: Payer: Self-pay | Admitting: Cardiovascular Disease

## 2016-01-20 DIAGNOSIS — E876 Hypokalemia: Secondary | ICD-10-CM

## 2016-01-20 DIAGNOSIS — R071 Chest pain on breathing: Secondary | ICD-10-CM

## 2016-01-20 DIAGNOSIS — R079 Chest pain, unspecified: Secondary | ICD-10-CM

## 2016-01-20 DIAGNOSIS — I10 Essential (primary) hypertension: Secondary | ICD-10-CM | POA: Diagnosis not present

## 2016-01-20 DIAGNOSIS — I2510 Atherosclerotic heart disease of native coronary artery without angina pectoris: Secondary | ICD-10-CM | POA: Diagnosis not present

## 2016-01-20 DIAGNOSIS — J069 Acute upper respiratory infection, unspecified: Secondary | ICD-10-CM | POA: Diagnosis not present

## 2016-01-20 DIAGNOSIS — E785 Hyperlipidemia, unspecified: Secondary | ICD-10-CM | POA: Diagnosis not present

## 2016-01-20 DIAGNOSIS — R0789 Other chest pain: Secondary | ICD-10-CM | POA: Diagnosis not present

## 2016-01-20 LAB — BASIC METABOLIC PANEL
ANION GAP: 9 (ref 5–15)
BUN: 14 mg/dL (ref 6–20)
CALCIUM: 8.9 mg/dL (ref 8.9–10.3)
CHLORIDE: 106 mmol/L (ref 101–111)
CO2: 23 mmol/L (ref 22–32)
CREATININE: 0.97 mg/dL (ref 0.61–1.24)
GFR calc Af Amer: >60 mL/min (ref >60)
GFR calc non Af Amer: >60 mL/min (ref >60)
GLUCOSE: 137 mg/dL — AB (ref 65–99)
Potassium: 3.5 mmol/L (ref 3.5–5.1)
Sodium: 138 mmol/L (ref 135–145)

## 2016-01-20 LAB — HEMOGLOBIN A1C
HEMOGLOBIN A1C: 6.1 % — AB (ref 4.8–5.6)
Mean Plasma Glucose: 128 mg/dL

## 2016-01-20 LAB — CBC
HCT: 40.5 % (ref 39.0–52.0)
HEMOGLOBIN: 13 g/dL (ref 13.0–17.0)
MCH: 27.1 pg (ref 26.0–34.0)
MCHC: 32.1 g/dL (ref 30.0–36.0)
MCV: 84.4 fL (ref 78.0–100.0)
Platelets: 287 10*3/uL (ref 150–400)
RBC: 4.8 MIL/uL (ref 4.22–5.81)
RDW: 14.8 % (ref 11.5–15.5)
WBC: 6.9 10*3/uL (ref 4.0–10.5)

## 2016-01-20 LAB — TROPONIN I
TROPONIN I: 0.04 ng/mL — AB (ref <0.03)
Troponin I: <0.03 ng/mL (ref <0.03)

## 2016-01-20 MED ORDER — AZITHROMYCIN 250 MG PO TABS: 250.0000 mg | ORAL_TABLET | Freq: Every day | ORAL | Status: DC

## 2016-01-20 MED ORDER — POTASSIUM CHLORIDE ER 10 MEQ PO TBCR: 10.0000 meq | EXTENDED_RELEASE_TABLET | Freq: Every day | ORAL | 0 refills | Status: DC

## 2016-01-20 MED ORDER — AZITHROMYCIN 250 MG PO TABS: ORAL_TABLET | ORAL | 0 refills | Status: DC

## 2016-01-20 MED ORDER — POTASSIUM CHLORIDE CRYS ER 20 MEQ PO TBCR
40.0000 meq | EXTENDED_RELEASE_TABLET | Freq: Once | ORAL | Status: AC
  Administered 2016-01-20: 40 meq via ORAL
  Filled 2016-01-20: qty 2

## 2016-01-20 MED ORDER — AZITHROMYCIN 500 MG PO TABS
500.0000 mg | ORAL_TABLET | Freq: Every day | ORAL | Status: AC
  Administered 2016-01-20: 500 mg via ORAL
  Filled 2016-01-20: qty 1

## 2016-01-20 MED FILL — Nitroglycerin IV Soln 100 MCG/ML in D5W: INTRA_ARTERIAL | Qty: 10 | Status: AC

## 2016-01-20 NOTE — Assessment & Plan Note (Signed)
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

## 2016-01-20 NOTE — Discharge Summary (Signed)
Discharge Summary    Patient ID: Donald [name],  MRN: [id number], DOB/AGE: September 01, 1970 45 y.o.  Admit date: 01/19/2016 Discharge date: 01/20/2016  Primary Care Provider: No PCP Per Patient Primary Cardiologist: Dr. Johnsie Cancel    Discharge Diagnoses    Principal Problem:   Chest pain Active Problems:   Acute URI   Hyperlipidemia   Essential hypertension   Right bundle branch block   Hypokalemia   Allergies Allergies  Allergen Reactions  . Ace Inhibitors Cough    Lisinopril caused cough     History of Present Illness     Donald [name] is a 45 y.o. male with a history of RBBB, HTN and HLD who presented to [facility] on 01/19/16 with chest pain. ECG showed RBBB with worsening ST deviation and he had ongoing chest pain, so he was taken back for emergent cardiac catheterization.   He was recently seen in the office by Lesia Hausen PA-C on 11/23/15 for evaluation of chest pain. He was set up for stress test, which was scheduled for 01/21/16.   More recently, the patient experienced an upper respiratory illness with chest and sinus congestion. He complained of diaphoresis and took a decongestant on the day of admission. He began to experience dull substernal chest pain and tachycardia palpitations. EMS was called and initially he was given nebulizer treatment. His EKG showed a right bundle branch block with anteroseptal ST elevation concerning for STEMI. A code STEMI was paged out and the patient was brought directly to the cardiac catheterization lab. He did not meet criteria for STEMI but with ongoing chest pain at rest, diaphoresis and ECG with RBBB with more ST deviation, it was felt emergent cath with possible PCI was warranted.   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     Hospital Course     Consultants: none  Chest Pain: felt to be non cardiac possibly 2/2 URI  Non obst CAD: LHC on 01/19/16  showed mild non obstructive CAD with a 40% oRamus lesion, hyperdynamic LV function and normal EDP. Continue daily ASA and statin.   HTN: followed by his PCP. Mildly elevated this AM, but hasn't gotten AM meds. Continue amlodipine + Hyzaar.   HLD: decently controlled with Pravastatin. Lipid profile is followed by his PCP. Recent LP showed LDL of 104 mg/dL.   Hypokalemia: K 2.7, likely 2/2 to HCTZ use. Will supplement here. We can start a small K supplement (44mEq daily) and this can be follow up on as an outpatient by PCP.   Possible bronchitis vs early PNA: he has wheezing and rhonchi on exam. Still with significant coughing. CXR with atelectasis vs infiltrates. Will start a zpac  The patient has had an uncomplicated hospital course and is recovering well. The radial catheter site is stable. He has been seen by Dr. Burt Knack today and deemed ready for discharge home. A work excuse note was provided as well. Discharge medications are listed below.  _____________  Discharge Vitals Blood pressure (!) 159/81, pulse 89, temperature 97.6 F (36.4 C), temperature source Oral, resp. rate 19, weight 263 lb 14.3 oz (119.7 kg), SpO2 100 %.  Filed Weights   01/19/16 2000 01/20/16 0452  Weight: 257 lb 15 oz (117 kg) 263 lb 14.3 oz (119.7 kg)   Physical Exam: General: Well developed, well nourished, alert and oriented, in no acute distress. HEENT: Normocephalic, atraumatic, sclera anicteric Neck: Supple. Carotids 2+ without bruits.  JVP normal Lungs: scattered wheezing and rhonchi Heart: regular with normal S1 and S2. No murmurs, rubs, or gallops appreciated. Abdomen: Soft, non-tender, non-distended with normoactive bowel sounds. No hepatomegaly. No rebound/guarding. No obvious abdominal masses. Back: No CVA tenderness Msk:  Strength and tone appear normal for age. Extremities: No clubbing, cyanosis, or edema.  Distal pedal pulses are 2+ and equal bilaterally. Neuro: CNII-XII intact, moves all  extremities spontaneously. Psych:  Responds to questions appropriately with a normal affect. Skin: warm and dry without rash   Labs & Radiologic Studies     CBC  Recent Labs  01/19/16 2019 01/20/16 0735  WBC 6.9 6.9  HGB 12.3* 13.0  HCT 37.9* 40.5  MCV 83.1 84.4  PLT 285 [id number]   Basic Metabolic Panel  Recent Labs  01/19/16 1806 01/19/16 1833 01/19/16 2019  NA 139 142  --   K 2.8* 2.7*  --   CL 105 102  --   CO2 23  --   --   GLUCOSE 119* 129*  --   BUN 15 17  --   CREATININE 1.12 1.00 1.05  CALCIUM 9.4  --   --    Liver Function Tests  Recent Labs  01/19/16 1806  AST 27  ALT 23  ALKPHOS 54  BILITOT 0.6  PROT 8.0  ALBUMIN 4.1   No results for input(s): LIPASE, AMYLASE in the last 72 hours. Cardiac Enzymes  Recent Labs  01/19/16 1806 01/19/16 2019 01/20/16 0138  CKTOTAL 236  --   --   CKMB 3.6  --   --   TROPONINI  --  <0.03 0.04*    Fasting Lipid Panel  Recent Labs  01/19/16 1834  CHOL 175  HDL 36*  LDLCALC 106*  TRIG 164*  CHOLHDL 4.9   Thyroid Function Tests No results for input(s): TSH, T4TOTAL, T3FREE, THYROIDAB in the last 72 hours.  Invalid input(s): FREET3  Dg Chest Port 1 View  Result Date: 01/20/2016 CLINICAL DATA:  Cough and congestion. EXAM: PORTABLE CHEST 1 VIEW COMPARISON:  08/10/2013 . FINDINGS: Mediastinum hilar structures are normal. Heart size stable. Low lung volumes with mild bibasilar atelectasis and/or infiltrates. No prominent pleural effusion. No pneumothorax. IMPRESSION: Low lung volumes with mild bibasilar atelectasis and/or infiltrates. Electronically Signed   By: Melody Hill   On: 01/20/2016 07:19     Diagnostic Studies/Procedures   LHC 01/19/16 Conclusion    Ost Ramus lesion, 40 %stenosed.  There is hyperdynamic left ventricular systolic function.  LV end diastolic pressure is normal.  The left ventricular ejection fraction is greater than 65% by visual estimate.   1. Mild nonobstructive  CAD 2. Hyperdynamic LV function  Suspect noncardiac cause of chest pain     _____________    Disposition   Pt is being discharged home today in good condition.  Follow-up Plans & Appointments    Follow-up Information    [name], MD .   Specialties:  Family Medicine, Sports Medicine Why:  Please see Dr. Nyoka Cowden in the next 2-3 weeks to check your lab work (BMET for potassium) and follow up on cough.  Contact information: [place] [id number] [phone]            Discharge Medications     Medication List    TAKE these medications   amLODipine 10 MG tablet Commonly known as:  NORVASC Take 1 tablet (10 mg total) by mouth daily.   aspirin 81 MG tablet Take 1 tablet (81 mg  total) by mouth daily.   azithromycin 250 MG tablet Commonly known as:  ZITHROMAX 500mg  x1 given in hospital. Continue 250mg  daily x4 days Start taking on:  01/21/2016   losartan-hydrochlorothiazide 100-25 MG tablet Commonly known as:  HYZAAR Take 1 tablet by mouth daily.   potassium chloride 10 MEQ tablet Commonly known as:  K-DUR Take 1 tablet (10 mEq total) by mouth daily.   pravastatin 40 MG tablet Commonly known as:  PRAVACHOL Take 1 tablet (40 mg total) by mouth daily.          Outstanding Labs/Studies   BMET by PCP  Duration of Discharge Encounter   Greater than 30 minutes including physician time.  Signed, Angelena Form PA-C 01/20/2016, 8:17 AM  Patient seen, examined. Available data reviewed. Agree with findings, assessment, and plan as outlined by Angelena Form, PA-C. The patient is independently interviewed and examined. Lung fields have scattered rhonchi. Heart is regular rate and rhythm without murmur or gallop. Abdomen soft and nontender. There is no peripheral edema. The patient is having no further chest pain except with coughing. Chest x-ray is reviewed and demonstrates mild bibasilar atelectasis and/or infiltrates. The  patient is afebrile. His oxygen saturations 100%. There is a minimal troponin elevation of 0.04 but there was no significant coronary obstruction at cardiac catheterization and LV function was vigorous. I agree with empiric course of azithromycin and close follow-up with primary care physician. The patient is seen at [facility]. No cardiology follow-up is required as an outpatient. He should follow primary risk reduction guidelines with a statin drug and antihypertensive therapy as he is currently doing.   Sherren Mocha, M.D. 01/20/2016 8:35 AM

## 2016-01-20 NOTE — Discharge Instructions (Signed)

## 2016-01-21 ENCOUNTER — Encounter (HOSPITAL_COMMUNITY): Payer: Self-pay

## 2016-02-01 ENCOUNTER — Encounter: Payer: Self-pay | Admitting: Cardiovascular Disease

## 2016-02-03 ENCOUNTER — Encounter: Payer: Self-pay | Admitting: Cardiovascular Disease

## 2016-02-16 ENCOUNTER — Ambulatory Visit: Payer: Self-pay | Admitting: Cardiovascular Disease

## 2016-02-16 ENCOUNTER — Telehealth: Payer: Self-pay | Admitting: *Deleted

## 2016-02-16 NOTE — Telephone Encounter (Signed)
called to inform pt they were not to come to appt today, LVM, for pt to call back for upcoming appt time or to reschedule.

## 2016-02-19 ENCOUNTER — Ambulatory Visit (INDEPENDENT_AMBULATORY_CARE_PROVIDER_SITE_OTHER): Payer: 59

## 2016-02-19 ENCOUNTER — Ambulatory Visit (INDEPENDENT_AMBULATORY_CARE_PROVIDER_SITE_OTHER): Payer: 59 | Admitting: Osteopathic Medicine

## 2016-02-19 VITALS — BP 150/84 | HR 81 | Temp 98.2°F | Resp 18 | Ht 73.0 in | Wt 254.0 lb

## 2016-02-19 DIAGNOSIS — I10 Essential (primary) hypertension: Secondary | ICD-10-CM

## 2016-02-19 DIAGNOSIS — R0982 Postnasal drip: Secondary | ICD-10-CM | POA: Diagnosis not present

## 2016-02-19 DIAGNOSIS — R7303 Prediabetes: Secondary | ICD-10-CM | POA: Diagnosis not present

## 2016-02-19 DIAGNOSIS — E876 Hypokalemia: Secondary | ICD-10-CM

## 2016-02-19 DIAGNOSIS — R05 Cough: Secondary | ICD-10-CM

## 2016-02-19 DIAGNOSIS — R053 Chronic cough: Secondary | ICD-10-CM

## 2016-02-19 LAB — BASIC METABOLIC PANEL WITH GFR
BUN: 14 mg/dL (ref 7–25)
CO2: 26 mmol/L (ref 20–31)
CREATININE: 0.94 mg/dL (ref 0.60–1.35)
Calcium: 9.1 mg/dL (ref 8.6–10.3)
Chloride: 106 mmol/L (ref 98–110)
GFR, Est Non African American: >89 mL/min (ref >=60)
GLUCOSE: 105 mg/dL — AB (ref 65–99)
POTASSIUM: 4.1 mmol/L (ref 3.5–5.3)
Sodium: 141 mmol/L (ref 135–146)

## 2016-02-19 LAB — MAGNESIUM: Magnesium: 2 mg/dL (ref 1.5–2.5)

## 2016-02-19 MED ORDER — PREDNISONE 20 MG PO TABS: 20.0000 mg | ORAL_TABLET | Freq: Two times a day (BID) | ORAL | 0 refills | Status: DC

## 2016-02-19 MED ORDER — IPRATROPIUM BROMIDE 0.03 % NA SOLN: 2.0000 | Freq: Three times a day (TID) | NASAL | 0 refills | Status: DC | PRN

## 2016-02-19 NOTE — Patient Instructions (Addendum)
Your cough is most likely due to postnasal drip which irritates the airways, or may be due to irritation with post-viral cough syndrome. Your chest Xray does not show pneumonia which would get better with antibiotics. We will try a burst of steroids to see if we can calm down the inflammation which may be causing your cough, and we will also start treatment for sinus drainage with a nasal spray. If your symptoms get worse, particularly if fever or weakness with shortness of breath, please let us know!  For blood pressure - be sure to take your medications at the same time each day, especially when you're coming to the doctor. You pressure is a bit high today since you skipped a dose. Please be sure to follow up for a blood pressure recheck in 1 month.   For potassium - we will call you once results are available. If your potassium is mildly low we may need to increase your potassium pills you're taking at home. If it's seriously low, we may ask you to come to the office for further testing and talk about changing your medications.     IF you received an x-ray today, you will receive an invoice from [facility]. Please contact [facility] at [phone] with questions or concerns regarding your invoice.   IF you received labwork today, you will receive an invoice from [employer]. Please contact [facility] at [phone] with questions or concerns regarding your invoice.   Our billing staff will not be able to assist you with questions regarding bills from these companies.  You will be contacted with the lab results as soon as they are available. The fastest way to get your results is to activate your My Chart account. Instructions are located on the last page of this paperwork. If you have not heard from [place] regarding the results in 2 weeks, please contact this office.

## 2016-02-19 NOTE — Progress Notes (Signed)
HPI: Donald [name] is a 45 y.o.  male  who presents to [facility] today, 02/19/16,  for chief complaint of:  Chief Complaint  Patient presents with  . Cough    Semi-productive  . Labs Only    Potassium     Coughing - ongoing several weeks, worse in morning, no fever/chills, nonproductive, assoc w/ sinus congestion, was given z-pack by ER but this made no difference.   Potassium - seen in ER 1 month ago for chest pain r/o, K was low 2.7 and repleted to 3.5, he was started on supplementation and asked to follow up for recheck.   HTN - has not taken medications today, no CP/SOB.    Past medical, surgical, social and family history reviewed: Past Medical History:  Diagnosis Date  . DEPRESSION, MILD   . Dysuria   . G E R D   . Hypercholesterolemia   . Hyperlipidemia    NOT TAKING MEDICATION  . Hypertension   . Lumbago   . NEPHROLITHIASIS, HX OF   . PARASOMNIA   . Right bundle branch block    Past Surgical History:  Procedure Laterality Date  . CARDIAC CATHETERIZATION N/A 01/19/2016   Procedure: Left Heart Cath and Coronary Angiography;  Surgeon: Sherren Mocha, MD;  Location: [place] [facility];  Service: Cardiovascular;  Laterality: N/A;  . HAND SURGERY    . right and left hand     Social History  Substance Use Topics  . Smoking status: Never Smoker  . Smokeless tobacco: Never Used  . Alcohol use No   Family History  Problem Relation Age of Onset  . Hypertension Mother   . Hypertension Sister      Current medication list and allergy/intolerance information reviewed:   Current Outpatient Prescriptions  Medication Sig Dispense Refill  . amLODipine (NORVASC) 10 MG tablet Take 1 tablet (10 mg total) by mouth daily. 90 tablet 1  . aspirin 81 MG tablet Take 1 tablet (81 mg total) by mouth daily. 30 tablet 5  . losartan-hydrochlorothiazide (HYZAAR) 100-25 MG tablet Take 1 tablet by mouth daily. 90 tablet 1  . potassium chloride (K-DUR) 10  MEQ tablet Take 1 tablet (10 mEq total) by mouth daily. 90 tablet 0  . pravastatin (PRAVACHOL) 40 MG tablet Take 1 tablet (40 mg total) by mouth daily. 90 tablet 1   No current facility-administered medications for this visit.    Allergies  Allergen Reactions  . Ace Inhibitors Cough    Lisinopril caused cough      Review of Systems:  Constitutional:  No  fever, no chills, +recent illness, No unintentional weight changes. No significant fatigue.   HEENT: No  headache, no vision change, no hearing change, No sore throat, +sinus pressure  Cardiac: No  chest pain, No  pressure, No palpitations  Respiratory:  No  shortness of breath. +Cough  Gastrointestinal: No  abdominal pain, No  nausea, No  vomiting,    Skin: No  Rash  Exam:  BP (!) 150/84   Pulse 81   Temp 98.2 F (36.8 C) (Oral)   Resp 18   Ht 6' 1"  (1.854 m)   Wt 254 lb (115.2 kg)   SpO2 99%   BMI 33.51 kg/m   Constitutional: VS see above. General Appearance: alert, well-developed, well-nourished, NAD  Eyes: Normal lids and conjunctive, non-icteric sclera  Ears, Nose, Mouth, Throat: MMM, Normal external inspection ears/nares/mouth/lips/gums. TM normal bilaterally. Pharynx/tonsils no erythema, no exudate. Nasal mucosa  normal.   Neck: No masses, trachea midline. No thyroid enlargement. No tenderness/mass appreciated. No lymphadenopathy  Respiratory: Normal respiratory effort. no wheeze, no rhonchi, no rales  Cardiovascular: S1/S2 normal, no murmur, no rub/gallop auscultated. RRR. No lower extremity edema.    Recent Results (from the past 2160 hour(s))  Comprehensive metabolic panel     Status: Abnormal   Collection Time: 01/19/16  6:06 PM  Result Value Ref Range   Sodium 139 135 - 145 mmol/L   Potassium 2.8 (L) 3.5 - 5.1 mmol/L   Chloride 105 101 - 111 mmol/L   CO2 23 22 - 32 mmol/L   Glucose, Bld 119 (H) 65 - 99 mg/dL   BUN 15 6 - 20 mg/dL   Creatinine, Ser 1.12 0.61 - 1.24 mg/dL   Calcium 9.4 8.9 - 10.3  mg/dL   Total Protein 8.0 6.5 - 8.1 g/dL   Albumin 4.1 3.5 - 5.0 g/dL   AST 27 15 - 41 U/L   ALT 23 17 - 63 U/L   Alkaline Phosphatase 54 38 - 126 U/L   Total Bilirubin 0.6 0.3 - 1.2 mg/dL   GFR calc non Af Amer >60 >60 mL/min   GFR calc Af Amer >60 >60 mL/min    Comment: (NOTE) The eGFR has been calculated using the CKD EPI equation. This calculation has not been validated in all clinical situations. eGFR's persistently <60 mL/min signify possible Chronic Kidney Disease.    Anion gap 11 5 - 15  CK total and CKMB (cardiac)not at [facility]     Status: None   Collection Time: 01/19/16  6:06 PM  Result Value Ref Range   Total CK 236 49 - 397 U/L   CK, MB 3.6 0.5 - 5.0 ng/mL   Relative Index 1.5 0.0 - 2.5  CBC     Status: None   Collection Time: 01/19/16  6:06 PM  Result Value Ref Range   WBC 8.7 4.0 - 10.5 K/uL   RBC 4.83 4.22 - 5.81 MIL/uL   Hemoglobin 13.4 13.0 - 17.0 g/dL   HCT 40.4 39.0 - 52.0 %   MCV 83.6 78.0 - 100.0 fL   MCH 27.7 26.0 - 34.0 pg   MCHC 33.2 30.0 - 36.0 g/dL   RDW 14.4 11.5 - 15.5 %   Platelets 335 150 - 400 K/uL  Protime-INR     Status: None   Collection Time: 01/19/16  6:06 PM  Result Value Ref Range   Prothrombin Time 13.8 11.4 - 15.2 seconds   INR 1.05   APTT     Status: None   Collection Time: 01/19/16  6:06 PM  Result Value Ref Range   aPTT 31 24 - 36 seconds  I-STAT, chem 8     Status: Abnormal   Collection Time: 01/19/16  6:33 PM  Result Value Ref Range   Sodium 142 135 - 145 mmol/L   Potassium 2.7 (LL) 3.5 - 5.1 mmol/L   Chloride 102 101 - 111 mmol/L   BUN 17 6 - 20 mg/dL   Creatinine, Ser 1.00 0.61 - 1.24 mg/dL   Glucose, Bld 129 (H) 65 - 99 mg/dL   Calcium, Ion 1.17 1.13 - 1.30 mmol/L   TCO2 23 0 - 100 mmol/L   Hemoglobin 13.3 13.0 - 17.0 g/dL   HCT 39.0 39.0 - 52.0 %   Comment NOTIFIED PHYSICIAN   Lipid panel     Status: Abnormal   Collection Time: 01/19/16  6:34 PM  Result Value  Ref Range   Cholesterol 175 0 - 200 mg/dL    Triglycerides 164 (H) <150 mg/dL   HDL 36 (L) >40 mg/dL   Total CHOL/HDL Ratio 4.9 RATIO   VLDL 33 0 - 40 mg/dL   LDL Cholesterol 106 (H) 0 - 99 mg/dL    Comment:        Total Cholesterol/HDL:CHD Risk Coronary Heart Disease Risk Table                     Men   Women  1/2 Average Risk   3.4   3.3  Average Risk       5.0   4.4  2 X Average Risk   9.6   7.1  3 X Average Risk  23.4   11.0        Use the calculated Patient Ratio above and the CHD Risk Table to determine the patient's CHD Risk.        ATP III CLASSIFICATION (LDL):  <100     mg/dL   Optimal  100-129  mg/dL   Near or Above                    Optimal  130-159  mg/dL   Borderline  160-189  mg/dL   High  >190     mg/dL   Very High   Hemoglobin A1c     Status: Abnormal   Collection Time: 01/19/16  6:34 PM  Result Value Ref Range   Hgb A1c MFr Bld 6.1 (H) 4.8 - 5.6 %    Comment: (NOTE)         Pre-diabetes: 5.7 - 6.4         Diabetes: >6.4         Glycemic control for adults with diabetes: <7.0    Mean Plasma Glucose 128 mg/dL    Comment: (NOTE) Performed At: [facility] [place], [place] [id number] Lindon Romp MD [id number]   CBC     Status: Abnormal   Collection Time: 01/19/16  8:19 PM  Result Value Ref Range   WBC 6.9 4.0 - 10.5 K/uL   RBC 4.56 4.22 - 5.81 MIL/uL   Hemoglobin 12.3 (L) 13.0 - 17.0 g/dL   HCT 37.9 (L) 39.0 - 52.0 %   MCV 83.1 78.0 - 100.0 fL   MCH 27.0 26.0 - 34.0 pg   MCHC 32.5 30.0 - 36.0 g/dL   RDW 14.6 11.5 - 15.5 %   Platelets 285 150 - 400 K/uL  Creatinine, serum     Status: None   Collection Time: 01/19/16  8:19 PM  Result Value Ref Range   Creatinine, Ser 1.05 0.61 - 1.24 mg/dL   GFR calc non Af Amer >60 >60 mL/min   GFR calc Af Amer >60 >60 mL/min    Comment: (NOTE) The eGFR has been calculated using the CKD EPI equation. This calculation has not been validated in all clinical situations. eGFR's persistently <60 mL/min signify possible Chronic  Kidney Disease.   Troponin I     Status: None   Collection Time: 01/19/16  8:19 PM  Result Value Ref Range   Troponin I <0.03 <0.03 ng/mL  Troponin I     Status: Abnormal   Collection Time: 01/20/16  1:38 AM  Result Value Ref Range   Troponin I 0.04 (HH) <0.03 ng/mL    Comment: CRITICAL RESULT CALLED TO, READ BACK BY AND VERIFIED WITH: ZARSONE R,RN 01/20/16  0234 WAYK   Troponin I     Status: None   Collection Time: 01/20/16  7:35 AM  Result Value Ref Range   Troponin I <0.03 <0.03 ng/mL  Basic metabolic panel     Status: Abnormal   Collection Time: 01/20/16  7:35 AM  Result Value Ref Range   Sodium 138 135 - 145 mmol/L   Potassium 3.5 3.5 - 5.1 mmol/L   Chloride 106 101 - 111 mmol/L   CO2 23 22 - 32 mmol/L   Glucose, Bld 137 (H) 65 - 99 mg/dL   BUN 14 6 - 20 mg/dL   Creatinine, Ser 0.97 0.61 - 1.24 mg/dL   Calcium 8.9 8.9 - 10.3 mg/dL   GFR calc non Af Amer >60 >60 mL/min   GFR calc Af Amer >60 >60 mL/min    Comment: (NOTE) The eGFR has been calculated using the CKD EPI equation. This calculation has not been validated in all clinical situations. eGFR's persistently <60 mL/min signify possible Chronic Kidney Disease.    Anion gap 9 5 - 15  CBC     Status: None   Collection Time: 01/20/16  7:35 AM  Result Value Ref Range   WBC 6.9 4.0 - 10.5 K/uL   RBC 4.80 4.22 - 5.81 MIL/uL   Hemoglobin 13.0 13.0 - 17.0 g/dL   HCT 40.5 39.0 - 52.0 %   MCV 84.4 78.0 - 100.0 fL   MCH 27.1 26.0 - 34.0 pg   MCHC 32.1 30.0 - 36.0 g/dL   RDW 14.8 11.5 - 15.5 %   Platelets 287 150 - 400 K/uL    Dg Chest 2 View  Result Date: 02/19/2016 CLINICAL DATA:  PERSISTENT COUGH EXAM: CHEST  2 VIEW COMPARISON:  01/20/2016 FINDINGS: Normal mediastinum and cardiac silhouette. No effusion, infiltrate pneumothorax. Mild interstitial prominence centrally. No acute osseous abnormality. Set IMPRESSION: Mild interstitial prominence centrally may represent bronchiolitis. No focal infiltrate. Electronically  Signed   By: Suzy Bouchard M.D.   On: 02/19/2016 09:30  CXR personally reviewed   Labs reviewed from recent hospitalization: A1C 6.1, BMP was ok on D/C  ASSESSMENT/PLAN:   Essential hypertension  Hypokalemia - Plan: BASIC METABOLIC PANEL WITH GFR, Magnesium  Persistent cough - Plan: DG Chest 2 View, predniSONE (DELTASONE) 20 MG tablet  Postnasal drip - Plan: ipratropium (ATROVENT) 0.03 % nasal spray  Prediabetes - A1C 6.1 01/2016   Patient Instructions   Your cough is most likely due to postnasal drip which irritates the airways, or may be due to irritation with post-viral cough syndrome. Your chest Xray does not show pneumonia which would get better with antibiotics. We will try a burst of steroids to see if we can calm down the inflammation which may be causing your cough, and we will also start treatment for sinus drainage with a nasal spray. If your symptoms get worse, particularly if fever or weakness with shortness of breath, please let us know!  For blood pressure - be sure to take your medications at the same time each day, especially when you're coming to the doctor. You pressure is a bit high today since you skipped a dose. Please be sure to follow up for a blood pressure recheck in 1 month.   For potassium - we will call you once results are available. If your potassium is mildly low we may need to increase your potassium pills you're taking at home. If it's seriously low, we may ask you to come to the office  for further testing and talk about changing your medications.     Visit summary with medication list and pertinent instructions was printed for patient to review. All questions at time of visit were answered - patient instructed to contact office with any additional concerns. ER/RTC precautions were reviewed with the patient. Follow-up plan: Return in about 4 weeks (around 03/18/2016), or sooner if needed / based on labs, for BLOOD PRESSURE CHECK.

## 2016-03-28 ENCOUNTER — Ambulatory Visit (INDEPENDENT_AMBULATORY_CARE_PROVIDER_SITE_OTHER): Payer: 59

## 2016-03-28 ENCOUNTER — Ambulatory Visit (INDEPENDENT_AMBULATORY_CARE_PROVIDER_SITE_OTHER): Payer: 59 | Admitting: Family Medicine

## 2016-03-28 VITALS — BP 118/80 | HR 85 | Temp 98.6°F | Resp 17 | Ht 73.0 in | Wt 258.0 lb

## 2016-03-28 DIAGNOSIS — R059 Cough, unspecified: Secondary | ICD-10-CM

## 2016-03-28 DIAGNOSIS — R05 Cough: Secondary | ICD-10-CM

## 2016-03-28 DIAGNOSIS — I10 Essential (primary) hypertension: Secondary | ICD-10-CM

## 2016-03-28 DIAGNOSIS — J324 Chronic pansinusitis: Secondary | ICD-10-CM | POA: Diagnosis not present

## 2016-03-28 LAB — COMPLETE METABOLIC PANEL WITH GFR
ALBUMIN: 4.3 g/dL (ref 3.6–5.1)
ALK PHOS: 53 U/L (ref 40–115)
ALT: 14 U/L (ref 9–46)
AST: 17 U/L (ref 10–40)
BUN: 11 mg/dL (ref 7–25)
CALCIUM: 9.3 mg/dL (ref 8.6–10.3)
CHLORIDE: 102 mmol/L (ref 98–110)
CO2: 25 mmol/L (ref 20–31)
CREATININE: 0.92 mg/dL (ref 0.60–1.35)
GFR, Est African American: >89 mL/min (ref >=60)
GFR, Est Non African American: >89 mL/min (ref >=60)
Glucose, Bld: 89 mg/dL (ref 65–99)
Potassium: 4.1 mmol/L (ref 3.5–5.3)
Sodium: 138 mmol/L (ref 135–146)
Total Bilirubin: 0.6 mg/dL (ref 0.2–1.2)
Total Protein: 7.5 g/dL (ref 6.1–8.1)

## 2016-03-28 LAB — LIPID PANEL
CHOLESTEROL: 164 mg/dL (ref 125–200)
HDL: 39 mg/dL — ABNORMAL LOW (ref >=40)
LDL Cholesterol: 103 mg/dL (ref <130)
Total CHOL/HDL Ratio: 4.2 Ratio (ref <=5.0)
Triglycerides: 112 mg/dL (ref <150)
VLDL: 22 mg/dL (ref <30)

## 2016-03-28 MED ORDER — POTASSIUM CHLORIDE ER 10 MEQ PO TBCR: 10.0000 meq | EXTENDED_RELEASE_TABLET | Freq: Every day | ORAL | 3 refills | Status: DC

## 2016-03-28 MED ORDER — IPRATROPIUM BROMIDE 0.03 % NA SOLN: 2.0000 | Freq: Two times a day (BID) | NASAL | 0 refills | Status: DC

## 2016-03-28 MED ORDER — LOSARTAN POTASSIUM-HCTZ 100-25 MG PO TABS: 1.0000 | ORAL_TABLET | Freq: Every day | ORAL | 3 refills | Status: DC

## 2016-03-28 MED ORDER — AMOXICILLIN-POT CLAVULANATE 875-125 MG PO TABS: 1.0000 | ORAL_TABLET | Freq: Two times a day (BID) | ORAL | 0 refills | Status: DC

## 2016-03-28 MED ORDER — BENZONATATE 100 MG PO CAPS: 100.0000 mg | ORAL_CAPSULE | Freq: Three times a day (TID) | ORAL | 0 refills | Status: DC | PRN

## 2016-03-28 MED ORDER — AMLODIPINE BESYLATE 10 MG PO TABS: 10.0000 mg | ORAL_TABLET | Freq: Every day | ORAL | 3 refills | Status: DC

## 2016-03-28 NOTE — Patient Instructions (Addendum)
Start Augmentin 875-125 1 tablet twice daily for 10 days for sinus infection.  Purchase over the counter Zyrtec and take 10 mg at bedtime nightly for at least 2 months for sinus congestion.  Take benzonatate 100-200 mg up to 3 times daily for cough as needed.  Use Atrovent nasal spray 2 puffs, twice daily for relief of nasal congestion.   If symptoms do not improve, I will refer you to ENT and or pulmonology for further evaluation of cough.  You will be notified of your lab results.  I have refilled all of your medications. If you potassium is elevated, I will contact you to stop taking potassium supplements.  IF you received an x-ray today, you will receive an invoice from [facility]. Please contact [facility] at [phone] with questions or concerns regarding your invoice.   IF you received labwork today, you will receive an invoice from [employer]. Please contact [facility] at [phone] with questions or concerns regarding your invoice.   Our billing staff will not be able to assist you with questions regarding bills from these companies.  You will be contacted with the lab results as soon as they are available. The fastest way to get your results is to activate your My Chart account. Instructions are located on the last page of this paperwork. If you have not heard from [place] regarding the results in 2 weeks, please contact this office.

## 2016-03-28 NOTE — Progress Notes (Signed)
Patient ID: Asif Hasek, male    DOB: 1970/07/27, 45 y.o.   MRN: [id number]  PCP: No PCP Per Patient  Chief Complaint  Patient presents with  . Hypertension  . Cough    "for a long time"  . Medication Refill    Potassium     Subjective:   HPI 45 year old, male presents for evaluation of cough and hypertension.  Cough  Reports a chronic cough times 3 months. He was seen at the ED 01/19/16 for some cardiac related symptoms and cough. Upon discharge he was treated with azithromycin. He reports upon completion of antibiotic, the cough did not improved. He later presented to [facility] 02/19/16 still complaining of cough and received a repeat chest xray which indicated possible bronchiolitis.  Today he complains of continued "stuffy nose" with green mucus production and unilateral left sided headache. He reports significant bilateral nares stuffiness which increased to the point during bedtime he has to breathe through his mouth. He reports intermittent body aches with subjective fever.   Hypertension  He doesn't routinely check his blood pressure at home. Denies chest pain or dizziness. Attributes current shortness of breath and headache to nasal congestion.   Review of Systems See HPI  Patient Active Problem List   Diagnosis Date Noted  . Chest pain 01/20/2016  . Acute URI 01/20/2016  . Hypokalemia 01/20/2016  . Right bundle branch block 03/11/2010  . LUMBAGO 03/09/2010  . DYSURIA 03/09/2010  . NEPHROLITHIASIS, HX OF 12/10/2009  . Essential hypertension 05/14/2009  . PARASOMNIA 05/14/2009  . Hyperlipidemia 05/13/2009  . G E R D 05/13/2009     Prior to Admission medications   Medication Sig Start Date End Date Taking? Authorizing Provider  amLODipine (NORVASC) 10 MG tablet Take 1 tablet (10 mg total) by mouth daily. 10/29/15  Yes Jaynee Eagles, PA-C  aspirin 81 MG tablet Take 1 tablet (81 mg total) by mouth daily. 10/29/15  Yes Jaynee Eagles, PA-C  losartan-hydrochlorothiazide  (HYZAAR) 100-25 MG tablet Take 1 tablet by mouth daily. 10/29/15  Yes Jaynee Eagles, PA-C  potassium chloride (K-DUR) 10 MEQ tablet Take 1 tablet (10 mEq total) by mouth daily. 01/20/16  Yes Eileen Stanford, PA-C  pravastatin (PRAVACHOL) 40 MG tablet Take 1 tablet (40 mg total) by mouth daily. 10/29/15  Yes Jaynee Eagles, PA-C  predniSONE (DELTASONE) 20 MG tablet Take 1 tablet (20 mg total) by mouth 2 (two) times daily with a meal. Patient not taking: Reported on 03/28/2016 02/19/16   Emeterio Reeve, DO    Allergies  Allergen Reactions  . Ace Inhibitors Cough    Lisinopril caused cough      Objective:  Physical Exam  Constitutional: He is oriented to person, place, and time. He appears well-developed and well-nourished.  HENT:  Head: Normocephalic and atraumatic.  Right Ear: Hearing, tympanic membrane, external ear and ear canal normal.  Left Ear: Hearing, tympanic membrane, external ear and ear canal normal.  Nose: Mucosal edema and rhinorrhea present. Right sinus exhibits frontal sinus tenderness. Right sinus exhibits no maxillary sinus tenderness. Left sinus exhibits frontal sinus tenderness. Left sinus exhibits no maxillary sinus tenderness.  Mouth/Throat: Uvula is midline, oropharynx is clear and moist and mucous membranes are normal.  Eyes: Conjunctivae are normal. Pupils are equal, round, and reactive to light.  Neck: Trachea normal and normal range of motion. Neck supple.  Cardiovascular: Normal rate, regular rhythm, normal heart sounds, intact distal pulses and normal pulses.   Pulmonary/Chest: Effort normal. No accessory  muscle usage. No respiratory distress. He has decreased breath sounds. He exhibits no tenderness and no crepitus.  Musculoskeletal: Normal range of motion.  Neurological: He is alert and oriented to person, place, and time.  Skin: Skin is warm and dry.  Psychiatric: He has a normal mood and affect. His behavior is normal. Judgment and thought content normal.      Vitals:   03/28/16 1100  BP: 118/80  Pulse: 85  Resp: 17  Temp: 98.6 F (37 C)   Dg Chest 2 View  Result Date: 03/28/2016 CLINICAL DATA:  Productive cough for 3 months. EXAM: CHEST  2 VIEW COMPARISON:  02/19/2016 and 08/10/2013 FINDINGS: The heart size and mediastinal contours are within normal limits. Both lungs are clear. The visualized skeletal structures are unremarkable. IMPRESSION: No active cardiopulmonary disease. Electronically Signed   By: Earle Gell M.D.   On: 03/28/2016 12:18   Assessment & Plan:  1. Cough - DG Chest 2 View-unremarkable  Plan: Start benzonatate (Tessalon) 100-200 mg 3 times daily as needed.  2. Essential hypertension - Lipid panel - COMPLETE METABOLIC PANEL WITH GFR  Plan: -Continue amlodipine (NORVASC) 10 MG tablet;  Take 1 tablet (10 mg total) by mouth daily.   -Continue  losartan-hydrochlorothiazide (HYZAAR) 100-25 MG tablet;  Take 1 tablet by mouth daily.    3. Pansinusitis, unspecified chronicity Plan:  Start Amoxicillin-clavulanate (Augmentin) 875-125 mg 1 tablet, 2 times daily. Start ipratropium (Atrovent) 0.03% nasal spray  Carroll Sage. Kenton Kingfisher, MSN, FNP-C [facility]

## 2016-03-29 ENCOUNTER — Encounter: Payer: Self-pay | Admitting: Family Medicine

## 2016-03-29 NOTE — Progress Notes (Signed)
March 29, 2016   [address] [place] [address]   Dear Mr. Stolp,  Below are the results from your recent visit were as expected.  Resulted Orders  Lipid panel  Result Value Ref Range   Cholesterol 164 125 - 200 mg/dL   Triglycerides 112 <150 mg/dL   HDL 39 (L) >=40 mg/dL   Total CHOL/HDL Ratio 4.2 <=5.0 Ratio   VLDL 22 <30 mg/dL   LDL Cholesterol 103 <130 mg/dL     Comment:       Total Cholesterol/HDL Ratio:CHD Risk                        Coronary Heart Disease Risk Table                                        Men       Women          1/2 Average Risk              3.4        3.3              Average Risk              5.0        4.4           2X Average Risk              9.6        7.1           3X Average Risk             23.4       11.0 Use the calculated Patient Ratio above and the CHD Risk table  to determine the patient's CHD Risk.    Narrative   Performed at:  [facility]                [id number]                [place], [place] [address]  COMPLETE METABOLIC PANEL WITH GFR  Result Value Ref Range   Sodium 138 135 - 146 mmol/L   Potassium 4.1 3.5 - 5.3 mmol/L   Chloride 102 98 - 110 mmol/L   CO2 25 20 - 31 mmol/L   Glucose, Bld 89 65 - 99 mg/dL   BUN 11 7 - 25 mg/dL   Creat 0.92 0.60 - 1.35 mg/dL   Total Bilirubin 0.6 0.2 - 1.2 mg/dL   Alkaline Phosphatase 53 40 - 115 U/L   AST 17 10 - 40 U/L   ALT 14 9 - 46 U/L   Total Protein 7.5 6.1 - 8.1 g/dL   Albumin 4.3 3.6 - 5.1 g/dL   Calcium 9.3 8.6 - 10.3 mg/dL   GFR, Est African American >89 >=60 mL/min   GFR, Est Non African American >89 >=60 mL/min   Narrative   Performed at:  [id number]                [place], [place] [address]     If you have any questions or concerns, please don't hesitate to call.  Sincerely,   Molli Barrows, FNP

## 2016-03-30 ENCOUNTER — Telehealth: Payer: Self-pay | Admitting: Family Medicine

## 2016-03-30 NOTE — Telephone Encounter (Signed)
Pt calling about xrays please respond

## 2016-04-04 ENCOUNTER — Encounter: Payer: Self-pay | Admitting: Radiology

## 2016-04-04 NOTE — Telephone Encounter (Signed)
The patient was sent a letter advising of lab results. Sent a message to lab to call regarding lab results.

## 2016-04-04 NOTE — Telephone Encounter (Signed)
Pt was given normal CXR report now he want to know about lab results please respond

## 2016-04-04 NOTE — Telephone Encounter (Signed)
Results for orders placed or performed in visit on 03/28/16  Lipid panel  Result Value Ref Range   Cholesterol 164 125 - 200 mg/dL   Triglycerides 112 <150 mg/dL   HDL 39 (L) >=40 mg/dL   Total CHOL/HDL Ratio 4.2 <=5.0 Ratio   VLDL 22 <30 mg/dL   LDL Cholesterol 103 <130 mg/dL  COMPLETE METABOLIC PANEL WITH GFR  Result Value Ref Range   Sodium 138 135 - 146 mmol/L   Potassium 4.1 3.5 - 5.3 mmol/L   Chloride 102 98 - 110 mmol/L   CO2 25 20 - 31 mmol/L   Glucose, Bld 89 65 - 99 mg/dL   BUN 11 7 - 25 mg/dL   Creat 0.92 0.60 - 1.35 mg/dL   Total Bilirubin 0.6 0.2 - 1.2 mg/dL   Alkaline Phosphatase 53 40 - 115 U/L   AST 17 10 - 40 U/L   ALT 14 9 - 46 U/L   Total Protein 7.5 6.1 - 8.1 g/dL   Albumin 4.3 3.6 - 5.1 g/dL   Calcium 9.3 8.6 - 10.3 mg/dL   GFR, Est African American >89 >=60 mL/min   GFR, Est Non African American >89 >=60 mL/min

## 2016-04-04 NOTE — Telephone Encounter (Signed)
Lab please advise Donald [name] of his normal lab results.

## 2016-04-07 NOTE — Telephone Encounter (Signed)
LM on [facility] labs normal.

## 2016-05-03 ENCOUNTER — Other Ambulatory Visit: Payer: Self-pay | Admitting: Urgent Care

## 2016-05-03 DIAGNOSIS — E785 Hyperlipidemia, unspecified: Secondary | ICD-10-CM

## 2016-07-04 ENCOUNTER — Telehealth (HOSPITAL_COMMUNITY): Payer: Self-pay | Admitting: Cardiology

## 2016-07-04 NOTE — Telephone Encounter (Signed)
Pt has been contacted on 02/14/16, 04/03/16, and 07/04/16 and messages have been left to call back to reschedule appt that was cancelled due to hospitalization on 01/21/16( pt also cx 2x before on 7/14 and 7/28). No call has been returned and will be removed from the workqueue.

## 2016-09-02 ENCOUNTER — Other Ambulatory Visit: Payer: Self-pay | Admitting: Urgent Care

## 2016-09-02 DIAGNOSIS — E785 Hyperlipidemia, unspecified: Secondary | ICD-10-CM

## 2016-09-02 NOTE — Telephone Encounter (Signed)
03/2016 last ov and lab

## 2016-09-23 ENCOUNTER — Ambulatory Visit (INDEPENDENT_AMBULATORY_CARE_PROVIDER_SITE_OTHER): Payer: 59 | Admitting: Family Medicine

## 2016-09-23 ENCOUNTER — Encounter: Payer: Self-pay | Admitting: Family Medicine

## 2016-09-23 VITALS — BP 118/64 | HR 74 | Temp 98.2°F | Resp 18 | Ht 72.0 in | Wt 253.1 lb

## 2016-09-23 DIAGNOSIS — I10 Essential (primary) hypertension: Secondary | ICD-10-CM

## 2016-09-23 DIAGNOSIS — E785 Hyperlipidemia, unspecified: Secondary | ICD-10-CM

## 2016-09-23 DIAGNOSIS — Z114 Encounter for screening for human immunodeficiency virus [HIV]: Secondary | ICD-10-CM

## 2016-09-23 MED ORDER — PRAVASTATIN SODIUM 40 MG PO TABS: ORAL_TABLET | ORAL | 1 refills | Status: DC

## 2016-09-23 NOTE — Progress Notes (Signed)
Subjective:  By signing my name below, I, Donald [name], attest that this documentation has been prepared under the direction and in the presence of Donald Ray, MD. Electronically Signed: [name], [place]. 09/23/2016 , 3:49 PM .  Patient was seen in Room 10 .   Patient ID: Donald [name], male    DOB: 08-10-1970, 46 y.o.   MRN: [id number] Chief Complaint  Patient presents with  . [name] Pressure Check  . Medication Refill    see pended meds   HPI Donald [name] is a 46 y.o. male Here for follow up on HTN. He was seen by me in Dec 2016, but most recently evaluated in Oct 2017 by Molli Barrows, NP, for cough. His last meal was less than 2 hours ago. He plans to return next week for fasting lab work done, including HIV screening.   HTN His BP at last visit (Oct 2017) was 118/80. He was continued on Norvasc 10mg  QD and hyzaar 100-25mg  QD.   Lab Results  Component Value Date   CREATININE 0.92 03/28/2016   He's still taking potassium 25meq QD. He denies any complications with his medications. He denies chest pain, cough, shortness of breath, abdominal pain, [name] in stool, dizziness or lightheadedness.   Hyperlipidemia Lab Results  Component Value Date   CHOL 164 03/28/2016   HDL 39 (L) 03/28/2016   LDLCALC 103 03/28/2016   TRIG 112 03/28/2016   CHOLHDL 4.2 03/28/2016   Lab Results  Component Value Date   ALT 14 03/28/2016   AST 17 03/28/2016   ALKPHOS 53 03/28/2016   BILITOT 0.6 03/28/2016   He takes pravastatin 40mg  QD. He denies any complications with this medication.   Patient Active Problem List   Diagnosis Date Noted  . Chest pain 01/20/2016  . Acute URI 01/20/2016  . Hypokalemia 01/20/2016  . Right bundle branch block 03/11/2010  . LUMBAGO 03/09/2010  . DYSURIA 03/09/2010  . NEPHROLITHIASIS, HX OF 12/10/2009  . Essential hypertension 05/14/2009  . PARASOMNIA 05/14/2009  . Hyperlipidemia 05/13/2009  . G E R D 05/13/2009   Past Medical  History:  Diagnosis Date  . DEPRESSION, MILD   . Dysuria   . G E R D   . Hypercholesterolemia   . Hyperlipidemia    NOT TAKING MEDICATION  . Hypertension   . Lumbago   . NEPHROLITHIASIS, HX OF   . PARASOMNIA   . Right bundle branch block    Past Surgical History:  Procedure Laterality Date  . CARDIAC CATHETERIZATION N/A 01/19/2016   Procedure: Left Heart Cath and Coronary Angiography;  Surgeon: Sherren Mocha, MD;  Location: [facility];  Service: Cardiovascular;  Laterality: N/A;  . HAND SURGERY    . right and left hand     Allergies  Allergen Reactions  . Ace Inhibitors Cough    Lisinopril caused cough   Prior to Admission medications   Medication Sig Start Date End Date Taking? Authorizing Provider  amLODipine (NORVASC) 10 MG tablet Take 1 tablet (10 mg total) by mouth daily. 03/28/16  Yes Sedalia Muta, FNP  aspirin 81 MG tablet Take 1 tablet (81 mg total) by mouth daily. 10/29/15  Yes Jaynee Eagles, PA-C  losartan-hydrochlorothiazide (HYZAAR) 100-25 MG tablet Take 1 tablet by mouth daily. 03/28/16  Yes Sedalia Muta, FNP  potassium chloride (K-DUR) 10 MEQ tablet Take 1 tablet (10 mEq total) by mouth daily. 03/28/16  Yes Sedalia Muta, FNP  pravastatin (PRAVACHOL) 40 MG tablet TAKE 1  TABLET BY MOUTH DAILY. *NEED OFFICE VISIT* 09/02/16  Yes Leonie Douglas, PA-C   Social History   Social History  . Marital status: Married    Spouse name: N/A  . Number of children: 2  . Years of education: 15   Occupational History  . machine operator [place] History Main Topics  . Smoking status: Never Smoker  . Smokeless tobacco: Never Used  . Alcohol use No  . Drug use: No  . Sexual activity: Not on file   Other Topics Concern  . Not on file   Social History Narrative   ** Merged History Encounter **       Review of Systems  Constitutional: Negative for fatigue and unexpected weight change.  Eyes: Negative for  visual disturbance.  Respiratory: Negative for cough, chest tightness and shortness of breath.   Cardiovascular: Negative for chest pain, palpitations and leg swelling.  Gastrointestinal: Negative for abdominal pain and [name] in stool.  Neurological: Negative for dizziness, light-headedness and headaches.       Objective:   Physical Exam  Constitutional: He is oriented to person, place, and time. He appears well-developed and well-nourished.  HENT:  Head: Normocephalic and atraumatic.  Eyes: EOM are normal. Pupils are equal, round, and reactive to light.  Neck: No JVD present. Carotid bruit is not present.  Cardiovascular: Normal rate, regular rhythm and normal heart sounds.   No murmur heard. Pulmonary/Chest: Effort normal and breath sounds normal. He has no rales.  Musculoskeletal: He exhibits no edema.  Neurological: He is alert and oriented to person, place, and time.  Skin: Skin is warm and dry.  Psychiatric: He has a normal mood and affect.  Vitals reviewed.   Vitals:   09/23/16 1517 09/23/16 1558  BP: (!) 151/79 118/64  Pulse: 74   Resp: 18   Temp: 98.2 F (36.8 C)   TempSrc: Oral   SpO2: 99%   Weight: 253 lb 2 oz (114.8 kg)   Height: 6' (1.829 m)       Assessment & Plan:   Norvell Caswell is a 46 y.o. male Essential hypertension - Plan: Comprehensive metabolic panel  - recheck ok. Not fasting. Tolerating current meds, no changes. Return next Week for fasting labs only  - Recheck within 6 months for physical.  Hyperlipidemia, unspecified hyperlipidemia type - Plan: pravastatin (PRAVACHOL) 40 MG tablet, Lipid panel  - Tolerating statin. Check lipid panel on upcoming fasting lab visit.  Encounter for screening for HIV - Plan: HIV antibody   Meds ordered this encounter  Medications  . pravastatin (PRAVACHOL) 40 MG tablet    Sig: TAKE 1 TABLET BY MOUTH DAILY.    Dispense:  90 tablet    Refill:  1   Patient Instructions   Return for fasting [name] work  next Saturday. No change in medications for now. Monitor your [name] pressure outside the office and if remaining over 140/90, return to discuss changes in medications. Otherwise schedule a follow-up physical within 6 months.    IF you received an x-[name] today, you will receive an invoice from [facility]. Please contact [facility] at [phone] with questions or concerns regarding your invoice.   IF you received labwork today, you will receive an invoice from [place]. Please contact [facility] at [phone] with questions or concerns regarding your invoice.   Our billing staff will not be able to assist you with questions regarding bills from these companies.  You will be contacted with  the lab results as soon as they are available. The fastest way to get your results is to activate your My Chart account. Instructions are located on the last page of this paperwork. If you have not heard from [place] regarding the results in 2 weeks, please contact this office.       I personally performed the services described in this documentation, which was scribed in my presence. The recorded information has been reviewed and considered for accuracy and completeness, addended by me as needed, and agree with information above.  Signed,   Donald Ray, MD [facility].  09/23/16 4:02 PM

## 2016-09-23 NOTE — Addendum Note (Signed)
Addended by: [name] R on: 09/23/2016 04:05 PM   Modules accepted: Orders

## 2016-09-23 NOTE — Patient Instructions (Addendum)
Return for fasting blood work next Saturday. No change in medications for now. Monitor your blood pressure outside the office and if remaining over 140/90, return to discuss changes in medications. Otherwise schedule a follow-up physical within 6 months.    IF you received an x-ray today, you will receive an invoice from [facility]. Please contact [facility] at [phone] with questions or concerns regarding your invoice.   IF you received labwork today, you will receive an invoice from [facility]. Please contact [facility] at [phone] with questions or concerns regarding your invoice.   Our billing staff will not be able to assist you with questions regarding bills from these companies.  You will be contacted with the lab results as soon as they are available. The fastest way to get your results is to activate your My Chart account. Instructions are located on the last page of this paperwork. If you have not heard from [place] regarding the results in 2 weeks, please contact this office.

## 2016-09-30 ENCOUNTER — Other Ambulatory Visit: Payer: 59 | Admitting: Family Medicine

## 2016-09-30 DIAGNOSIS — Z114 Encounter for screening for human immunodeficiency virus [HIV]: Secondary | ICD-10-CM

## 2016-09-30 DIAGNOSIS — I10 Essential (primary) hypertension: Secondary | ICD-10-CM

## 2016-09-30 DIAGNOSIS — E785 Hyperlipidemia, unspecified: Secondary | ICD-10-CM | POA: Diagnosis not present

## 2016-09-30 NOTE — Progress Notes (Signed)
Lab visit - patient not seen by provider.

## 2016-10-01 LAB — COMPREHENSIVE METABOLIC PANEL
A/G RATIO: 1.4 (ref 1.2–2.2)
ALT: 13 IU/L (ref 0–44)
AST: 16 IU/L (ref 0–40)
Albumin: 4.2 g/dL (ref 3.5–5.5)
Alkaline Phosphatase: 60 IU/L (ref 39–117)
BUN/Creatinine Ratio: 13 (ref 9–20)
BUN: 13 mg/dL (ref 6–24)
Bilirubin Total: 0.6 mg/dL (ref 0.0–1.2)
CALCIUM: 9.8 mg/dL (ref 8.7–10.2)
CO2: 25 mmol/L (ref 18–29)
Chloride: 100 mmol/L (ref 96–106)
Creatinine, Ser: 1 mg/dL (ref 0.76–1.27)
GFR, EST AFRICAN AMERICAN: 102 mL/min/{1.73_m2} (ref >59)
GFR, EST NON AFRICAN AMERICAN: 89 mL/min/{1.73_m2} (ref >59)
GLOBULIN, TOTAL: 3.1 g/dL (ref 1.5–4.5)
Glucose: 93 mg/dL (ref 65–99)
POTASSIUM: 5.1 mmol/L (ref 3.5–5.2)
SODIUM: 139 mmol/L (ref 134–144)
TOTAL PROTEIN: 7.3 g/dL (ref 6.0–8.5)

## 2016-10-01 LAB — HIV ANTIBODY (ROUTINE TESTING W REFLEX): HIV Screen 4th Generation wRfx: NONREACTIVE

## 2016-10-01 LAB — LIPID PANEL
CHOL/HDL RATIO: 4.1 ratio (ref 0.0–5.0)
Cholesterol, Total: 186 mg/dL (ref 100–199)
HDL: 45 mg/dL (ref >39)
LDL CALC: 126 mg/dL — AB (ref 0–99)
TRIGLYCERIDES: 74 mg/dL (ref 0–149)
VLDL Cholesterol Cal: 15 mg/dL (ref 5–40)

## 2017-03-02 ENCOUNTER — Ambulatory Visit (INDEPENDENT_AMBULATORY_CARE_PROVIDER_SITE_OTHER): Payer: 59 | Admitting: Family Medicine

## 2017-03-02 ENCOUNTER — Encounter: Payer: Self-pay | Admitting: Family Medicine

## 2017-03-02 VITALS — BP 110/74 | HR 71 | Temp 98.1°F | Resp 16 | Ht 72.0 in | Wt 253.4 lb

## 2017-03-02 DIAGNOSIS — I10 Essential (primary) hypertension: Secondary | ICD-10-CM

## 2017-03-02 DIAGNOSIS — M791 Myalgia, unspecified site: Secondary | ICD-10-CM

## 2017-03-02 DIAGNOSIS — Z125 Encounter for screening for malignant neoplasm of prostate: Secondary | ICD-10-CM

## 2017-03-02 DIAGNOSIS — Z23 Encounter for immunization: Secondary | ICD-10-CM

## 2017-03-02 DIAGNOSIS — Z0000 Encounter for general adult medical examination without abnormal findings: Secondary | ICD-10-CM | POA: Diagnosis not present

## 2017-03-02 DIAGNOSIS — E785 Hyperlipidemia, unspecified: Secondary | ICD-10-CM

## 2017-03-02 DIAGNOSIS — Z800 Family history of malignant neoplasm of digestive organs: Secondary | ICD-10-CM | POA: Diagnosis not present

## 2017-03-02 DIAGNOSIS — Z1211 Encounter for screening for malignant neoplasm of colon: Secondary | ICD-10-CM

## 2017-03-02 DIAGNOSIS — M542 Cervicalgia: Secondary | ICD-10-CM | POA: Diagnosis not present

## 2017-03-02 MED ORDER — AMLODIPINE BESYLATE 10 MG PO TABS: 10.0000 mg | ORAL_TABLET | Freq: Every day | ORAL | 3 refills | Status: DC

## 2017-03-02 MED ORDER — POTASSIUM CHLORIDE ER 10 MEQ PO TBCR: 10.0000 meq | EXTENDED_RELEASE_TABLET | Freq: Every day | ORAL | 1 refills | Status: DC

## 2017-03-02 MED ORDER — PRAVASTATIN SODIUM 40 MG PO TABS: ORAL_TABLET | ORAL | 1 refills | Status: DC

## 2017-03-02 MED ORDER — LOSARTAN POTASSIUM-HCTZ 100-25 MG PO TABS
1.0000 | ORAL_TABLET | Freq: Every day | ORAL | 1 refills | Status: DC
Start: 2017-03-02 — End: 2017-11-03

## 2017-03-02 NOTE — Progress Notes (Signed)
Subjective:  By signing my name below, I, Donald [name], attest that this documentation has been prepared under the direction and in the presence of Donald Agreste, MD Electronically Signed: Ladene Artist, ED Scribe 03/02/2017 at 9:58 AM.   Patient ID: Donald [name], male    DOB: [birth year], 46 y.o.   MRN: [id number]  Chief Complaint  Patient presents with  . Annual Exam    muscle pain in neck, shoulder, pain x 2 months   . Medication Refill    Norvasc, Hyzaar, K-Dur, Pravachol   HPI HPI Comments: Donald [name] is a 46 y.o. male who presents to the Emergency Department for an annual exam. H/o HTN, hyperlipidemia, GERD, hypokalemia. Also complaining of neck and shoulder pain x 2 months.   HTN On Hyzaar and Norvasc. BP was controlled at 09/2016 visit. Pt has been checking his BP outside of the office with systolic readings of 539-767. Lab Results  Component Value Date   CREATININE 1.00 09/30/2016   Hyperlipidemia Lab Results  Component Value Date   CHOL 186 09/30/2016   HDL 45 09/30/2016   LDLCALC 126 (H) 09/30/2016   TRIG 74 09/30/2016   CHOLHDL 4.1 09/30/2016   Lab Results  Component Value Date   ALT 13 09/30/2016   AST 16 09/30/2016   ALKPHOS 60 09/30/2016   BILITOT 0.6 09/30/2016  Takes Pravastatin 40 mg qd. Borderline elevated previously but LDL of 103 in 2017. Continued on same dose pravastatin. Pt states he walks more than he did in April. He has noticed intermittent myalgias in calves, left shoulder and left side of neck.    Wt Readings from Last 3 Encounters:  03/02/17 253 lb 6.4 oz (114.9 kg)  09/23/16 253 lb 2 oz (114.8 kg)  03/28/16 258 lb (117 kg)   Hypokalemia  Takes 10 meq potassium daily. Normal at 5.1 in April.   Shoulder and Neck Pain Pt reports intermittent left-sided neck pain that radiates into his left shoulder x 2 months. Denies fall, injury, increased pain at night. He has tried Tylenol with temporary relief.   CA Screening Colon  CA Screening: Pt reports mother has a h/o colon CA in her 8's.  Prostate CA Screening: Lab Results  Component Value Date   PSA 0.39 05/17/2015   PSA 0.42 09/13/2014   Immunizations Immunization History  Administered Date(s) Administered  . Influenza Whole 03/09/2010  . Td 03/09/2010  HIV Screening: non-reactive in April  Tdap: Agrees to receive at this visit; youngest child is 8 m.o.  Influenza vaccine: Agrees to receive at this visit   Depression Screening Depression screen [facility] 2/9 03/02/2017 09/23/2016 03/28/2016 02/19/2016 10/29/2015  Decreased Interest 0 0 0 0 0  Down, Depressed, Hopeless 0 0 0 0 0  PHQ - 2 Score 0 0 0 0 0     Visual Acuity Screening   Right eye Left eye Both eyes  Without correction: 20/20 20/20 20/20   With correction:      Dentist: sees dentist regularly  Exercise: walks for exercise 3 days/week  Patient Active Problem List   Diagnosis Date Noted  . Chest pain 01/20/2016  . Acute URI 01/20/2016  . Hypokalemia 01/20/2016  . Right bundle branch block 03/11/2010  . LUMBAGO 03/09/2010  . DYSURIA 03/09/2010  . NEPHROLITHIASIS, HX OF 12/10/2009  . Essential hypertension 05/14/2009  . PARASOMNIA 05/14/2009  . Hyperlipidemia 05/13/2009  . G E R D 05/13/2009   Past Medical History:  Diagnosis Date  . DEPRESSION, MILD   .  Dysuria   . G E R D   . Hypercholesterolemia   . Hyperlipidemia    NOT TAKING MEDICATION  . Hypertension   . Lumbago   . NEPHROLITHIASIS, HX OF   . PARASOMNIA   . Right bundle branch block    Past Surgical History:  Procedure Laterality Date  . CARDIAC CATHETERIZATION N/A 01/19/2016   Procedure: Left Heart Cath and Coronary Angiography;  Surgeon: Sherren Mocha, MD;  Location: [facility];  Service: Cardiovascular;  Laterality: N/A;  . HAND SURGERY    . right and left hand     Allergies  Allergen Reactions  . Ace Inhibitors Cough    Lisinopril caused cough   Prior to Admission medications   Medication Sig Start  Date End Date Taking? Authorizing Provider  amLODipine (NORVASC) 10 MG tablet Take 1 tablet (10 mg total) by mouth daily. 03/28/16  Yes Scot Jun, FNP  aspirin 81 MG tablet Take 1 tablet (81 mg total) by mouth daily. 10/29/15  Yes Jaynee Eagles, PA-C  losartan-hydrochlorothiazide (HYZAAR) 100-25 MG tablet Take 1 tablet by mouth daily. 03/28/16  Yes Scot Jun, FNP  potassium chloride (K-DUR) 10 MEQ tablet Take 1 tablet (10 mEq total) by mouth daily. 03/28/16  Yes Scot Jun, FNP  pravastatin (PRAVACHOL) 40 MG tablet TAKE 1 TABLET BY MOUTH DAILY. 09/23/16  Yes Donald Agreste, MD   Social History   Social History  . Marital status: Married    Spouse name: N/A  . Number of children: 2  . Years of education: 15   Occupational History  . machine operator [employer] History Main Topics  . Smoking status: Never Smoker  . Smokeless tobacco: Never Used  . Alcohol use No  . Drug use: No  . Sexual activity: Not on file   Other Topics Concern  . Not on file   Social History Narrative   ** Merged History Encounter **       Review of Systems  Musculoskeletal: Positive for arthralgias, myalgias and neck pain.  All other systems reviewed and are negative. 13 point ROS negative except for above concerns     Objective:   Physical Exam  Constitutional: He is oriented to person, place, and time. He appears well-developed and well-nourished.  HENT:  Head: Normocephalic and atraumatic.  Right Ear: External ear normal.  Left Ear: External ear normal.  Mouth/Throat: Oropharynx is clear and moist.  Eyes: Pupils are equal, round, and reactive to light. Conjunctivae and EOM are normal.  Neck: Normal range of motion. Neck supple. No thyromegaly present.  Cardiovascular: Normal rate, regular rhythm, normal heart sounds and intact distal pulses.   Pulmonary/Chest: Effort normal and breath sounds normal. No respiratory distress. He has no wheezes.  Abdominal:  Soft. He exhibits no distension. There is no tenderness. Hernia confirmed negative in the right inguinal area and confirmed negative in the left inguinal area.  Genitourinary: Prostate normal.  Musculoskeletal: Normal range of motion. He exhibits no edema or tenderness.  C-spine: No midline bony tenderness. Some discomfort on paraspinal muscles. Full ROM of C-spine.  Shoulder exam: full ROM. Full rotator cuff strength.  Lymphadenopathy:    He has no cervical adenopathy.  Neurological: He is alert and oriented to person, place, and time. He has normal reflexes.  Skin: Skin is warm and dry.  Psychiatric: He has a normal mood and affect. His behavior is normal.  Vitals reviewed.    Vitals:  03/02/17 0946  BP: 110/74  Pulse: 71  Resp: 16  Temp: 98.1 F (36.7 C)  SpO2: 97%  Weight: 253 lb 6.4 oz (114.9 kg)  Height: 6' (1.829 m)      Assessment & Plan:   Joseth Weigel is a 46 y.o. male Annual physical exam  --anticipatory guidance as below in AVS, screening labs above. Health maintenance items as above in HPI discussed/recommended as applicable.   Essential hypertension - Plan: amLODipine (NORVASC) 10 MG tablet, losartan-hydrochlorothiazide (HYZAAR) 100-25 MG tablet  - Stable, continue same doses of Norvasc and Hyzaar.  Hyperlipidemia, unspecified hyperlipidemia type - Plan: pravastatin (PRAVACHOL) 40 MG tablet, Comprehensive metabolic panel, Lipid panel Myalgia - Plan: CK  -Check lipid panel, option of trial off pravastatin for approximately 1-2 weeks to see if the episodic myalgias in his legs are due to statin. If he does have improvement off pravastatin, consider intermittent dosing versus change in treatment. If no change in myalgias, return to discuss other causes. CPK pending.  Flu vaccine need - Plan: Flu Vaccine QUAD 36+ mos IM  Need for Tdap vaccination - Plan: Tdap vaccine greater than or equal to 7yo IM  Screening for prostate cancer - Plan: PSA  -We discussed  pros and cons of prostate cancer screening, and after this discussion, he chose to have screening done. PSA obtained, and no concerning findings on DRE.   Neck pain  - Paraspinal neck pain, no midline bony tenderness, no known injury. Shoulder exam is reassuring. Suspect some possible DDD or spasm of paraspinals. Discussed range of motion, stretches, heat to area if needed, Tylenol as needed, recheck if persistent.  Family history of colon cancer in mother - Plan: Ambulatory referral to Gastroenterology Screen for colon cancer - Plan: Ambulatory referral to Gastroenterology  -Possible early family history of colon cancer, if so would be due for colonoscopy at age 57. Referred to Gastroenterology.  Meds ordered this encounter  Medications  . amLODipine (NORVASC) 10 MG tablet    Sig: Take 1 tablet (10 mg total) by mouth daily.    Dispense:  90 tablet    Refill:  3  . losartan-hydrochlorothiazide (HYZAAR) 100-25 MG tablet    Sig: Take 1 tablet by mouth daily.    Dispense:  90 tablet    Refill:  1  . pravastatin (PRAVACHOL) 40 MG tablet    Sig: TAKE 1 TABLET BY MOUTH DAILY.    Dispense:  90 tablet    Refill:  1  . potassium chloride (K-DUR) 10 MEQ tablet    Sig: Take 1 tablet (10 mEq total) by mouth daily.    Dispense:  90 tablet    Refill:  1   Patient Instructions   For muscle aches - I will check labwork. Can try stopping the pravastatin for a week or two to see if that makes a difference. If so, then let me know and we can try different cholesterol medicine or adjust dose depending on your results on testing.   No change in blood pressure meds for now.   I will refer you to gastroenterology for colon cancer screening due to parental history of colon cancer.   Neck and shoulder pain is likely due to pain in muscles, and can also be related to arthritis in the neck. Range of motion, stretches of the neck throughout the day, heat to the area as needed, Tylenol or occasional  Advil/Aleve only if needed and recheck in the next 1 month. Sooner if worse.  Keeping you healthy  Get these tests  Blood pressure- Have your blood pressure checked once a year by your healthcare provider.  Normal blood pressure is 120/80.  Weight- Have your body mass index (BMI) calculated to screen for obesity.  BMI is a measure of body fat based on height and weight. You can also calculate your own BMI at GravelBags.it.  Cholesterol- Have your cholesterol checked regularly starting at age 58, sooner may be necessary if you have diabetes, high blood pressure, if a family member developed heart diseases at an early age or if you smoke.   Chlamydia, HIV, and other sexual transmitted disease- Get screened each year until the age of 42 then within three months of each new sexual partner.  Diabetes- Have your blood sugar checked regularly if you have high blood pressure, high cholesterol, a family history of diabetes or if you are overweight.  Get these vaccines  Flu shot- Every fall.  Tetanus shot- Every 10 years.  Menactra- Single dose; prevents meningitis.  Take these steps  Don't smoke- If you do smoke, ask your healthcare provider about quitting. For tips on how to quit, go to www.smokefree.gov or call 1-800-QUIT-NOW.  Be physically active- Exercise 5 days a week for at least 30 minutes.  If you are not already physically active start slow and gradually work up to 30 minutes of moderate physical activity.  Examples of moderate activity include walking briskly, mowing the yard, dancing, swimming bicycling, etc.  Eat a healthy diet- Eat a variety of healthy foods such as fruits, vegetables, low fat milk, low fat cheese, yogurt, lean meats, poultry, fish, beans, tofu, etc.  For more information on healthy eating, go to www.thenutritionsource.org  Drink alcohol in moderation- Limit alcohol intake two drinks or less a day.  Never drink and drive.  Dentist- Brush and floss  teeth twice daily; visit your dentis twice a year.  Depression-Your emotional health is as important as your physical health.  If you're feeling down, losing interest in things you normally enjoy please talk with your healthcare provider.  Gun Safety- If you keep a gun in your home, keep it unloaded and with the safety lock on.  Bullets should be stored separately.  Helmet use- Always wear a helmet when riding a motorcycle, bicycle, rollerblading or skateboarding.  Safe sex- If you may be exposed to a sexually transmitted infection, use a condom  Seat belts- Seat bels can save your life; always wear one.  Smoke/Carbon Monoxide detectors- These detectors need to be installed on the appropriate level of your home.  Replace batteries at least once a year.  Skin Cancer- When out in the sun, cover up and use sunscreen SPF 15 or higher.  Violence- If anyone is threatening or hurting you, please tell your healthcare provider.       IF you received an x-ray today, you will receive an invoice from [facility]. Please contact [facility] at [phone] with questions or concerns regarding your invoice.   IF you received labwork today, you will receive an invoice from [facility]. Please contact [facility] at [phone] with questions or concerns regarding your invoice.   Our billing staff will not be able to assist you with questions regarding bills from these companies.  You will be contacted with the lab results as soon as they are available. The fastest way to get your results is to activate your My Chart account. Instructions are located on the last page of this paperwork. If you have  not heard from [place] regarding the results in 2 weeks, please contact this office.       I personally performed the services described in this documentation, which was scribed in my presence. The recorded information has been reviewed and considered for accuracy and completeness, addended by me  as needed, and agree with information above.  Signed,   [name], MD [facility].  03/02/17 2:18 PM

## 2017-03-02 NOTE — Patient Instructions (Addendum)
For muscle aches - I will check labwork. Can try stopping the pravastatin for a week or two to see if that makes a difference. If so, then let me know and we can try different cholesterol medicine or adjust dose depending on your results on testing.   No change in blood pressure meds for now.   I will refer you to gastroenterology for colon cancer screening due to parental history of colon cancer.   Neck and shoulder pain is likely due to pain in muscles, and can also be related to arthritis in the neck. Range of motion, stretches of the neck throughout the day, heat to the area as needed, Tylenol or occasional Advil/Aleve only if needed and recheck in the next 1 month. Sooner if worse.  Keeping you healthy  Get these tests  Blood pressure- Have your blood pressure checked once a year by your healthcare provider.  Normal blood pressure is 120/80.  Weight- Have your body mass index (BMI) calculated to screen for obesity.  BMI is a measure of body fat based on height and weight. You can also calculate your own BMI at [employer]velBags[employer]it.  Cholesterol- Have your cholesterol checked regularly starting at age 46, sooner may be necessary if you have diabetes, high blood pressure, if a family member developed heart diseases at an early age or if you smoke.   Chlamydia, HIV, and other sexual transmitted disease- Get screened each year until the age of 7 then within three months of each new sexual partner.  Diabetes- Have your blood sugar checked regularly if you have high blood pressure, high cholesterol, a family history of diabetes or if you are overweight.  Get these vaccines  Flu shot- Every fall.  Tetanus shot- Every 10 years.  Menactra- Single dose; prevents meningitis.  Take these steps  Don't smoke- If you do smoke, ask your healthcare provider about quitting. For tips on how to quit, go to www.smokefree.gov or call 1-800-QUIT-NOW.  Be physically active- Exercise 5 days a  week for at least 30 minutes.  If you are not already physically active start slow and gradually work up to 30 minutes of moderate physical activity.  Examples of moderate activity include walking briskly, mowing the yard, dancing, swimming bicycling, etc.  Eat a healthy diet- Eat a variety of healthy foods such as fruits, vegetables, low fat milk, low fat cheese, yogurt, lean meats, poultry, fish, beans, tofu, etc.  For more information on healthy eating, go to www.thenutritionsource.org  Drink alcohol in moderation- Limit alcohol intake two drinks or less a day.  Never drink and drive.  Dentist- Brush and floss teeth twice daily; visit your dentis twice a year.  Depression-Your emotional health is as important as your physical health.  If you're feeling down, losing interest in things you normally enjoy please talk with your healthcare provider.  Gun Safety- If you keep a gun in your home, keep it unloaded and with the safety lock on.  Bullets should be stored separately.  Helmet use- Always wear a helmet when riding a motorcycle, bicycle, rollerblading or skateboarding.  Safe sex- If you may be exposed to a sexually transmitted infection, use a condom  Seat belts- Seat bels can save your life; always wear one.  Smoke/Carbon Monoxide detectors- These detectors need to be installed on the appropriate level of your home.  Replace batteries at least once a year.  Skin Cancer- When out in the sun, cover up and use sunscreen SPF 15 or higher.  Violence- If anyone is threatening or hurting you, please tell your healthcare provider.       IF you received an x-ray today, you will receive an invoice from [facility]. Please contact [facility] at [phone] with questions or concerns regarding your invoice.   IF you received labwork today, you will receive an invoice from [facility]. Please contact [facility] at [phone] with questions or concerns regarding your invoice.    Our billing staff will not be able to assist you with questions regarding bills from these companies.  You will be contacted with the lab results as soon as they are available. The fastest way to get your results is to activate your My Chart account. Instructions are located on the last page of this paperwork. If you have not heard from [place] regarding the results in 2 weeks, please contact this office.

## 2017-03-03 LAB — COMPREHENSIVE METABOLIC PANEL
ALT: 16 IU/L (ref 0–44)
AST: 16 IU/L (ref 0–40)
Albumin/Globulin Ratio: 1.5 (ref 1.2–2.2)
Albumin: 4.5 g/dL (ref 3.5–5.5)
Alkaline Phosphatase: 62 IU/L (ref 39–117)
BILIRUBIN TOTAL: 0.6 mg/dL (ref 0.0–1.2)
BUN/Creatinine Ratio: 12 (ref 9–20)
BUN: 12 mg/dL (ref 6–24)
CHLORIDE: 99 mmol/L (ref 96–106)
CO2: 26 mmol/L (ref 20–29)
CREATININE: 0.97 mg/dL (ref 0.76–1.27)
Calcium: 9.7 mg/dL (ref 8.7–10.2)
GFR calc Af Amer: 106 mL/min/{1.73_m2} (ref >59)
GFR calc non Af Amer: 92 mL/min/{1.73_m2} (ref >59)
GLUCOSE: 95 mg/dL (ref 65–99)
Globulin, Total: 3 g/dL (ref 1.5–4.5)
Potassium: 4.3 mmol/L (ref 3.5–5.2)
Sodium: 140 mmol/L (ref 134–144)
Total Protein: 7.5 g/dL (ref 6.0–8.5)

## 2017-03-03 LAB — CK: Total CK: 216 U/L — ABNORMAL HIGH (ref 24–204)

## 2017-03-03 LAB — LIPID PANEL
Chol/HDL Ratio: 4.2 ratio (ref 0.0–5.0)
Cholesterol, Total: 181 mg/dL (ref 100–199)
HDL: 43 mg/dL (ref >39)
LDL Calculated: 123 mg/dL — ABNORMAL HIGH (ref 0–99)
TRIGLYCERIDES: 76 mg/dL (ref 0–149)
VLDL CHOLESTEROL CAL: 15 mg/dL (ref 5–40)

## 2017-03-03 LAB — PSA: Prostate Specific Ag, Serum: 0.4 ng/mL (ref 0.0–4.0)

## 2017-03-28 ENCOUNTER — Telehealth: Payer: Self-pay | Admitting: Family Medicine

## 2017-03-28 NOTE — Telephone Encounter (Signed)
[facility] called to let us no that the patient was a NO SHOW today

## 2017-03-29 NOTE — Telephone Encounter (Signed)
Please see note below. 

## 2017-04-20 ENCOUNTER — Ambulatory Visit: Payer: 59 | Admitting: Family Medicine

## 2017-04-27 ENCOUNTER — Other Ambulatory Visit: Payer: Self-pay

## 2017-04-27 ENCOUNTER — Ambulatory Visit: Payer: 59 | Admitting: Family Medicine

## 2017-04-27 ENCOUNTER — Encounter: Payer: Self-pay | Admitting: Family Medicine

## 2017-04-27 VITALS — BP 122/68 | HR 69 | Temp 98.0°F | Resp 16 | Ht 72.84 in | Wt 252.0 lb

## 2017-04-27 DIAGNOSIS — E785 Hyperlipidemia, unspecified: Secondary | ICD-10-CM | POA: Diagnosis not present

## 2017-04-27 DIAGNOSIS — R748 Abnormal levels of other serum enzymes: Secondary | ICD-10-CM

## 2017-04-27 DIAGNOSIS — M542 Cervicalgia: Secondary | ICD-10-CM

## 2017-04-27 DIAGNOSIS — M791 Myalgia, unspecified site: Secondary | ICD-10-CM | POA: Diagnosis not present

## 2017-04-27 NOTE — Progress Notes (Signed)
Subjective:  By signing my name below, I, Moises Blood, attest that this documentation has been prepared under the direction and in the presence of [name], MD. Electronically Signed: Moises Blood, [place]. 04/27/2017 , 4:57 PM .  Patient was seen in Room 11 .   Patient ID: Donald [name], male    DOB: 1970-08-14, 46 y.o.   MRN: [id number] Chief Complaint  Patient presents with  . Hyperlipidemia    follow-up from medication change   . Muscle Pain   HPI Donald [name] is a 46 y.o. male  Here for follow up of hyperlipidemia and myalgias. His last visit with me was in Sept. He is fasting today.   Hyperlipidemia Lab Results  Component Value Date   CHOL 181 03/02/2017   HDL 43 03/02/2017   LDLCALC 123 (H) 03/02/2017   TRIG 76 03/02/2017   CHOLHDL 4.2 03/02/2017   Lab Results  Component Value Date   ALT 16 03/02/2017   AST 16 03/02/2017   ALKPHOS 62 03/02/2017   BILITOT 0.6 03/02/2017   He was taking pravastatin 40mg  QD at last visit. He was experiencing intermittent myalgias in calves, shoulders and left neck. His left neck shoulder pain ongoing for 2 months without any known injury. Suspected DDD for paraspinal spasms of neck, symptomatic care was discussed. Option of trial off pravastatin for a few weeks to see if it changed his lower extremity symptoms. He had a borderline abnormal CPK at 216.   Patient states he's been off of his pravastatin for about 4 weeks now. He feels better, about 60% improved. He reports leg pain completely resolved after 10 days of stopping his medication. He still has some intermittent neck pain, which he takes tylenol. He denies history of smoking. He's planning to leave the [place] next week, and will be out for about 6 weeks.   Patient Active Problem List   Diagnosis Date Noted  . Chest pain 01/20/2016  . Acute URI 01/20/2016  . Hypokalemia 01/20/2016  . Right bundle branch block 03/11/2010  . LUMBAGO 03/09/2010  . DYSURIA  03/09/2010  . NEPHROLITHIASIS, HX OF 12/10/2009  . Essential hypertension 05/14/2009  . PARASOMNIA 05/14/2009  . Hyperlipidemia 05/13/2009  . G E R D 05/13/2009   Past Medical History:  Diagnosis Date  . DEPRESSION, MILD   . Dysuria   . G E R D   . Hypercholesterolemia   . Hyperlipidemia    NOT TAKING MEDICATION  . Hypertension   . Lumbago   . NEPHROLITHIASIS, HX OF   . PARASOMNIA   . Right bundle branch block    Past Surgical History:  Procedure Laterality Date  . CARDIAC CATHETERIZATION N/A 01/19/2016   Procedure: Left Heart Cath and Coronary Angiography;  Surgeon: Sherren Mocha, MD;  Location: [place];  Service: Cardiovascular;  Laterality: N/A;  . HAND SURGERY    . right and left hand     Allergies  Allergen Reactions  . Ace Inhibitors Cough    Lisinopril caused cough   Prior to Admission medications   Medication Sig Start Date End Date Taking? Authorizing Provider  amLODipine (NORVASC) 10 MG tablet Take 1 tablet (10 mg total) by mouth daily. 03/02/17   [name], MD  aspirin 81 MG tablet Take 1 tablet (81 mg total) by mouth daily. 10/29/15   Jaynee Eagles, PA-C  losartan-hydrochlorothiazide (HYZAAR) 100-25 MG tablet Take 1 tablet by mouth daily. 03/02/17   [name], MD  potassium chloride (  K-DUR) 10 MEQ tablet Take 1 tablet (10 mEq total) by mouth daily. 03/02/17   [name], MD  pravastatin (PRAVACHOL) 40 MG tablet TAKE 1 TABLET BY MOUTH DAILY. 03/02/17   [name], MD   Social History   Socioeconomic History  . Marital status: Married    Spouse name: Not on file  . Number of children: 2  . Years of education: 24  . Highest education level: Not on file  Social Needs  . Financial resource strain: Not on file  . Food insecurity - worry: Not on file  . Food insecurity - inability: Not on file  . Transportation needs - medical: Not on file  . Transportation needs - non-medical: Not on file  Occupational History  .  Occupation: Glass blower/designer    Employer: [employer]  Tobacco Use  . Smoking status: Never Smoker  . Smokeless tobacco: Never Used  Substance and Sexual Activity  . Alcohol use: No    Alcohol/week: 0.0 oz  . Drug use: No  . Sexual activity: Not on file  Other Topics Concern  . Not on file  Social History Narrative   ** Merged History Encounter **       Review of Systems  Constitutional: Negative for fatigue and unexpected weight change.  Eyes: Negative for visual disturbance.  Respiratory: Negative for cough, chest tightness and shortness of breath.   Cardiovascular: Negative for chest pain, palpitations and leg swelling.  Gastrointestinal: Negative for abdominal pain and blood in stool.  Musculoskeletal: Positive for neck pain. Negative for arthralgias, back pain and myalgias.  Neurological: Negative for dizziness, weakness, light-headedness, numbness and headaches.       Objective:   Physical Exam  Constitutional: He is oriented to person, place, and time. He appears well-developed and well-nourished.  HENT:  Head: Normocephalic and atraumatic.  Eyes: EOM are normal. Pupils are equal, round, and reactive to light.  Neck: No JVD present. Carotid bruit is not present.  Cardiovascular: Normal rate, regular rhythm and normal heart sounds.  No murmur heard. Pulmonary/Chest: Effort normal and breath sounds normal. He has no rales.  Musculoskeletal: He exhibits no edema.  No calf focal tenderness, no calf swelling, negative homans; C-spine full ROM  Neurological: He is alert and oriented to person, place, and time.  Skin: Skin is warm and dry.  Psychiatric: He has a normal mood and affect.  Vitals reviewed.   Vitals:   04/27/17 1613  BP: 122/68  Pulse: 69  Resp: 16  Temp: 98 F (36.7 C)  TempSrc: Oral  SpO2: 99%  Weight: 252 lb (114.3 kg)  Height: 6' 0.84" (1.85 m)      Assessment & Plan:   Donald [name] is a 46 y.o. male Hyperlipidemia, unspecified  hyperlipidemia type - Plan: Lipid panel, CK Myalgia - Plan: CK Elevated CPK - Plan: CK  -History of hyperlipidemia, but has had improvement and myalgias off of statin. Borderline CPK previously, recheck today, continue off statin at this time, check lipids and determine ASCVD risk to decide on next step as well as other potential treatment options. Could also consider repeat testing in the next few months.  -RTC precautions if worsening myalgias  Neck pain  -May be some component of the DDD, muscle spasm. Range of motion, stretches, RTC precautions if persistent or worsening  No orders of the defined types were placed in this encounter.  Patient Instructions    Ok to not take any statin medicine for now  as muscle aches have improved. I will look at cholesterol and muscle test again today. Watch diet, work on weight loss with low intensity exercise most days per week, and recheck in January to decide if other medication may be needed.   Range of motion/ gentle stretches of neck as needed throughout the day. If any worsening of neck or shoulder pain, please return to discuss further.    IF you received an x-ray today, you will receive an invoice from [facility]. Please contact [facility] at [phone] with questions or concerns regarding your invoice.   IF you received labwork today, you will receive an invoice from [place]. Please contact [facility] at [phone] with questions or concerns regarding your invoice.   Our billing staff will not be able to assist you with questions regarding bills from these companies.  You will be contacted with the lab results as soon as they are available. The fastest way to get your results is to activate your My Chart account. Instructions are located on the last page of this paperwork. If you have not heard from [place] regarding the results in 2 weeks, please contact this office.       I personally performed the services described  in this documentation, which was scribed in my presence. The recorded information has been reviewed and considered for accuracy and completeness, addended by me as needed, and agree with information above.  Signed,   [name], MD [facility] [place].  04/29/17 12:36 PM

## 2017-04-27 NOTE — Patient Instructions (Addendum)
  Ok to not take any statin medicine for now as muscle aches have improved. I will look at cholesterol and muscle test again today. Watch diet, work on weight loss with low intensity exercise most days per week, and recheck in January to decide if other medication may be needed.   Range of motion/ gentle stretches of neck as needed throughout the day. If any worsening of neck or shoulder pain, please return to discuss further.    IF you received an x-ray today, you will receive an invoice from [facility]. Please contact [facility] at [phone] with questions or concerns regarding your invoice.   IF you received labwork today, you will receive an invoice from [place]. Please contact [facility] at [phone] with questions or concerns regarding your invoice.   Our billing staff will not be able to assist you with questions regarding bills from these companies.  You will be contacted with the lab results as soon as they are available. The fastest way to get your results is to activate your My Chart account. Instructions are located on the last page of this paperwork. If you have not heard from [place] regarding the results in 2 weeks, please contact this office.

## 2017-04-28 LAB — CK: Total CK: 210 U/L — ABNORMAL HIGH (ref 24–204)

## 2017-04-28 LAB — LIPID PANEL
CHOLESTEROL TOTAL: 195 mg/dL (ref 100–199)
Chol/HDL Ratio: 4.3 ratio (ref 0.0–5.0)
HDL: 45 mg/dL (ref >39)
LDL Calculated: 136 mg/dL — ABNORMAL HIGH (ref 0–99)
Triglycerides: 69 mg/dL (ref 0–149)
VLDL Cholesterol Cal: 14 mg/dL (ref 5–40)

## 2017-07-27 ENCOUNTER — Ambulatory Visit: Payer: 59 | Admitting: Family Medicine

## 2017-08-07 ENCOUNTER — Ambulatory Visit: Payer: 59 | Admitting: Family Medicine

## 2017-08-08 ENCOUNTER — Telehealth: Payer: Self-pay | Admitting: Family Medicine

## 2017-08-08 NOTE — Telephone Encounter (Signed)
Called and spoke to pt to remind them of their apt tomorrow. Advised of building number and time restrictions.

## 2017-08-09 ENCOUNTER — Encounter: Payer: Self-pay | Admitting: Family Medicine

## 2017-08-09 ENCOUNTER — Ambulatory Visit (INDEPENDENT_AMBULATORY_CARE_PROVIDER_SITE_OTHER): Payer: 59

## 2017-08-09 ENCOUNTER — Ambulatory Visit: Payer: 59 | Admitting: Family Medicine

## 2017-08-09 ENCOUNTER — Other Ambulatory Visit: Payer: Self-pay

## 2017-08-09 VITALS — BP 100/64 | HR 69 | Temp 98.3°F | Resp 18 | Ht 72.84 in | Wt 249.6 lb

## 2017-08-09 DIAGNOSIS — M542 Cervicalgia: Secondary | ICD-10-CM | POA: Diagnosis not present

## 2017-08-09 DIAGNOSIS — R748 Abnormal levels of other serum enzymes: Secondary | ICD-10-CM | POA: Diagnosis not present

## 2017-08-09 DIAGNOSIS — M62838 Other muscle spasm: Secondary | ICD-10-CM

## 2017-08-09 DIAGNOSIS — E785 Hyperlipidemia, unspecified: Secondary | ICD-10-CM

## 2017-08-09 DIAGNOSIS — M5031 Other cervical disc degeneration,  high cervical region: Secondary | ICD-10-CM | POA: Diagnosis not present

## 2017-08-09 MED ORDER — CYCLOBENZAPRINE HCL 5 MG PO TABS: 2.5000 mg | ORAL_TABLET | Freq: Every evening | ORAL | 0 refills | Status: DC | PRN

## 2017-08-09 NOTE — Patient Instructions (Addendum)
  Shoulder pain/neck pain appears to be due to muscle spasm. Gentle range of motion and stretches throughout the day, muscle relaxant if needed at bedtime, Tylenol if needed during the day. You can also apply ice to the spasm area on back of neck and shoulder area to see if that helps with discomfort. Do NOT apply ice to the front of the neck.  I will check a muscle enzyme tests, cholesterol tests and x-rays today. I will let you know if there are any concerns on those tests but please follow-up in 2 weeks to decide next step.    IF you received an x-ray today, you will receive an invoice from [facility]. Please contact [facility] at [phone] with questions or concerns regarding your invoice.   IF you received labwork today, you will receive an invoice from [place]. Please contact [facility] at [phone] with questions or concerns regarding your invoice.   Our billing staff will not be able to assist you with questions regarding bills from these companies.  You will be contacted with the lab results as soon as they are available. The fastest way to get your results is to activate your My Chart account. Instructions are located on the last page of this paperwork. If you have not heard from [place] regarding the results in 2 weeks, please contact this office.

## 2017-08-09 NOTE — Progress Notes (Signed)
Subjective:    Patient ID: Donald [name], male    DOB: 1970/07/01, 47 y.o.   MRN: [id number]  HPI  Donald [name] is a 47 y.o. male Presents today for: Chief Complaint  Patient presents with  . bloodwork    wants his cholestrol check   . Shoulder Pain    both shoulders     Hyperlipidemia: Last seen 04/27/17. Prior myalgias on pravastatin 40mg  qd. Calves, shoulders, and neck for about 2 months. Though to have some spasm in neck, but tried off the pravastatin for a few weeks at that point. He did have borderline elevated CPK of 216. 60%improved when off statin for 4 weeks, but still some neck soreness and shoulder pain. Calf pain had resolved off statin. CPK was still borderline elevated at 210 off statin.   Shoulder pain on both sides moving up to neck has remained since last visit. Sore during the day. Tx: occasional tylenol. NKI. Feels like muscles in front of neck swell at times as well (describes swelling at SCM muscle).  Swells for a few weeks, then resolves  Lab Results  Component Value Date   CHOL 195 04/27/2017   CHOL 181 03/02/2017   CHOL 186 09/30/2016   Lab Results  Component Value Date   HDL 45 04/27/2017   HDL 43 03/02/2017   HDL 45 09/30/2016   Lab Results  Component Value Date   LDLCALC 136 (H) 04/27/2017   LDLCALC 123 (H) 03/02/2017   LDLCALC 126 (H) 09/30/2016   Lab Results  Component Value Date   TRIG 69 04/27/2017   TRIG 76 03/02/2017   TRIG 74 09/30/2016   Lab Results  Component Value Date   CHOLHDL 4.3 04/27/2017   CHOLHDL 4.2 03/02/2017   CHOLHDL 4.1 09/30/2016   No results found for: [facility]    Line technician, only some physical work, 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     Patient Active Problem List   Diagnosis Date Noted  . Chest pain 01/20/2016  . Acute URI 01/20/2016  . Hypokalemia 01/20/2016  . Right bundle branch block 03/11/2010  . LUMBAGO 03/09/2010  . DYSURIA 03/09/2010  . NEPHROLITHIASIS, HX OF 12/10/2009    . Essential hypertension 05/14/2009  . PARASOMNIA 05/14/2009  . Hyperlipidemia 05/13/2009  . G E R D 05/13/2009   Past Medical History:  Diagnosis Date  . DEPRESSION, MILD   . Dysuria   . G E R D   . Hypercholesterolemia   . Hyperlipidemia    NOT TAKING MEDICATION  . Hypertension   . Lumbago   . NEPHROLITHIASIS, HX OF   . PARASOMNIA   . Right bundle branch block    Past Surgical History:  Procedure Laterality Date  . CARDIAC CATHETERIZATION N/A 01/19/2016   Procedure: Left Heart Cath and Coronary Angiography;  Surgeon: Sherren Mocha, MD;  Location: [place] [facility];  Service: Cardiovascular;  Laterality: N/A;  . HAND SURGERY    . right and left hand     Allergies  Allergen Reactions  . Ace Inhibitors Cough    Lisinopril caused cough   Prior to Admission medications   Medication Sig Start Date End Date Taking? Authorizing Provider  amLODipine (NORVASC) 10 MG tablet Take 1 tablet (10 mg total) by mouth daily. 03/02/17  Yes [name], MD  aspirin 81 MG tablet Take 1 tablet (81 mg total) by mouth daily. 10/29/15  Yes Jaynee Eagles, PA-C  losartan-hydrochlorothiazide (HYZAAR) 100-25 MG tablet Take 1 tablet  by mouth daily. 03/02/17  Yes [name], MD  potassium chloride (K-DUR) 10 MEQ tablet Take 1 tablet (10 mEq total) by mouth daily. 03/02/17  Yes [name], MD   Social History   Socioeconomic History  . Marital status: Married    Spouse name: Not on file  . Number of children: 2  . Years of education: 15  . Highest education level: Not on file  Social Needs  . Financial resource strain: Not on file  . Food insecurity - worry: Not on file  . Food insecurity - inability: Not on file  . Transportation needs - medical: Not on file  . Transportation needs - non-medical: Not on file  Occupational History  . Occupation: Glass blower/designer    Employer: [employer]  Tobacco Use  . Smoking status: Never Smoker  . Smokeless tobacco: Never Used   Substance and Sexual Activity  . Alcohol use: No    Alcohol/week: 0.0 oz  . Drug use: No  . Sexual activity: Not on file  Other Topics Concern  . Not on file  Social History Narrative   ** Merged History Encounter **        Review of Systems  Gastrointestinal: Negative for abdominal pain.  Musculoskeletal: Positive for arthralgias, myalgias and neck pain. Negative for joint swelling and neck stiffness.  Skin: Negative for rash and wound.  Neurological: Negative for tremors and weakness.       Objective:   Physical Exam  Constitutional: He is oriented to person, place, and time. He appears well-developed and well-nourished.  HENT:  Head: Normocephalic and atraumatic.    Eyes: EOM are normal. Pupils are equal, round, and reactive to light.  Neck: No JVD present. Carotid bruit is not present.  Cardiovascular: Normal rate, regular rhythm and normal heart sounds.  No murmur heard. Pulmonary/Chest: Effort normal and breath sounds normal. He has no rales.  Musculoskeletal: He exhibits no edema.       Back:  Neurological: He is alert and oriented to person, place, and time.  Skin: Skin is warm and dry.  Psychiatric: He has a normal mood and affect.  Vitals reviewed.  Vitals:   08/09/17 1323  BP: 100/64  Pulse: 69  Resp: 18  Temp: 98.3 F (36.8 C)  TempSrc: Oral  SpO2: 98%  Weight: 249 lb 9.6 oz (113.2 kg)  Height: 6' 0.84" (1.85 m)          Assessment & Plan:   [name] is a 47 y.o. male Elevated CPK - Plan: CK Hyperlipidemia, unspecified hyperlipidemia type - Plan: Lipid panel, Comprehensive metabolic panel  - Repeat testing off statin. Did have some improvement in myalgias off statin, but still with some discomfort and neck/paraspinals unlikely related to statin. Borderline CPK, repeat today.  Trapezius muscle spasm - Plan: DG Cervical Spine 2 or 3 views, cyclobenzaprine (FLEXERIL) 5 MG tablet, DISCONTINUED: cyclobenzaprine (FLEXERIL) 5 MG tablet,  Neck pain - Plan: DG Cervical Spine 2 or 3 views  -Appears to be more consistent with spasm, but extends into paraspinals of neck. Based on time and her symptoms will check x-ray, trial of Flexeril at bedtime, Tylenol during the day, ice and range of motion. Recheck in 2 weeks to decide if referral to orthopedics, physical therapy, or other evaluation.  Meds ordered this encounter  Medications  . DISCONTD: cyclobenzaprine (FLEXERIL) 5 MG tablet    Sig: Take 0.5-1 tablets (2.5-5 mg total) by mouth at bedtime as needed. 1  pill by mouth up to every 8 hours as needed. Start with one pill by mouth each bedtime as needed due to sedation    Dispense:  15 tablet    Refill:  0  . cyclobenzaprine (FLEXERIL) 5 MG tablet    Sig: Take 0.5-1 tablets (2.5-5 mg total) by mouth at bedtime as needed.    Dispense:  15 tablet    Refill:  0   Patient Instructions    Shoulder pain/neck pain appears to be due to muscle spasm. Gentle range of motion and stretches throughout the day, muscle relaxant if needed at bedtime, Tylenol if needed during the day. You can also apply ice to the spasm area on back of neck and shoulder area to see if that helps with discomfort. Do NOT apply ice to the front of the neck.  I will check a muscle enzyme tests, cholesterol tests and x-rays today. I will let you know if there are any concerns on those tests but please follow-up in 2 weeks to decide next step.    IF you received an x-ray today, you will receive an invoice from [facility]. Please contact [facility] at [phone] with questions or concerns regarding your invoice.   IF you received labwork today, you will receive an invoice from [place]. Please contact [facility] at [phone] with questions or concerns regarding your invoice.   Our billing staff will not be able to assist you with questions regarding bills from these companies.  You will be contacted with the lab results as soon as they are  available. The fastest way to get your results is to activate your My Chart account. Instructions are located on the last page of this paperwork. If you have not heard from [place] regarding the results in 2 weeks, please contact this office.     Signed,   [name], MD [facility] [place].  08/09/17 2:10 PM

## 2017-08-10 LAB — COMPREHENSIVE METABOLIC PANEL
ALT: 15 IU/L (ref 0–44)
AST: 14 IU/L (ref 0–40)
Albumin/Globulin Ratio: 1.4 (ref 1.2–2.2)
Albumin: 4.5 g/dL (ref 3.5–5.5)
Alkaline Phosphatase: 72 IU/L (ref 39–117)
BILIRUBIN TOTAL: 0.7 mg/dL (ref 0.0–1.2)
BUN/Creatinine Ratio: 15 (ref 9–20)
BUN: 13 mg/dL (ref 6–24)
CHLORIDE: 101 mmol/L (ref 96–106)
CO2: 25 mmol/L (ref 20–29)
Calcium: 9.6 mg/dL (ref 8.7–10.2)
Creatinine, Ser: 0.88 mg/dL (ref 0.76–1.27)
GFR calc non Af Amer: 101 mL/min/{1.73_m2} (ref >59)
GFR, EST AFRICAN AMERICAN: 117 mL/min/{1.73_m2} (ref >59)
Globulin, Total: 3.3 g/dL (ref 1.5–4.5)
Glucose: 97 mg/dL (ref 65–99)
Potassium: 4.1 mmol/L (ref 3.5–5.2)
Sodium: 140 mmol/L (ref 134–144)
TOTAL PROTEIN: 7.8 g/dL (ref 6.0–8.5)

## 2017-08-10 LAB — LIPID PANEL
CHOL/HDL RATIO: 5.3 ratio — AB (ref 0.0–5.0)
Cholesterol, Total: 226 mg/dL — ABNORMAL HIGH (ref 100–199)
HDL: 43 mg/dL (ref >39)
LDL Calculated: 156 mg/dL — ABNORMAL HIGH (ref 0–99)
Triglycerides: 137 mg/dL (ref 0–149)
VLDL Cholesterol Cal: 27 mg/dL (ref 5–40)

## 2017-08-10 LAB — CK: CK TOTAL: 134 U/L (ref 24–204)

## 2017-08-23 ENCOUNTER — Encounter: Payer: Self-pay | Admitting: Family Medicine

## 2017-08-23 ENCOUNTER — Other Ambulatory Visit: Payer: Self-pay

## 2017-08-23 ENCOUNTER — Ambulatory Visit: Payer: 59 | Admitting: Family Medicine

## 2017-08-23 VITALS — BP 120/68 | HR 84 | Temp 98.3°F | Ht 73.0 in | Wt 254.4 lb

## 2017-08-23 DIAGNOSIS — R221 Localized swelling, mass and lump, neck: Secondary | ICD-10-CM | POA: Diagnosis not present

## 2017-08-23 DIAGNOSIS — E785 Hyperlipidemia, unspecified: Secondary | ICD-10-CM

## 2017-08-23 DIAGNOSIS — M5030 Other cervical disc degeneration, unspecified cervical region: Secondary | ICD-10-CM | POA: Diagnosis not present

## 2017-08-23 DIAGNOSIS — M62838 Other muscle spasm: Secondary | ICD-10-CM | POA: Diagnosis not present

## 2017-08-23 MED ORDER — CYCLOBENZAPRINE HCL 5 MG PO TABS: 2.5000 mg | ORAL_TABLET | Freq: Every evening | ORAL | 0 refills | Status: DC | PRN

## 2017-08-23 NOTE — Patient Instructions (Addendum)
I will refer you to physical therapy for your muscle spasm and neck pain. Ok to continue flexeril before bedtime if needed, tylenol as needed during the day.   You will be called about an appointment for the neck ultrasound to evaluate the fullness at the front neck muscles.   Continue to avoid statin medicine for now, but watch diet and exercise, with repeat testing in 3-6 months.   Return to the clinic or go to the nearest emergency room if any of your symptoms worsen or new symptoms occur.   IF you received an x-ray today, you will receive an invoice from [facility]. Please contact [facility] at [phone] with questions or concerns regarding your invoice.   IF you received labwork today, you will receive an invoice from [place]. Please contact [facility] at [phone] with questions or concerns regarding your invoice.   Our billing staff will not be able to assist you with questions regarding bills from these companies.  You will be contacted with the lab results as soon as they are available. The fastest way to get your results is to activate your My Chart account. Instructions are located on the last page of this paperwork. If you have not heard from [place] regarding the results in 2 weeks, please contact this office.

## 2017-08-23 NOTE — Progress Notes (Signed)
Subjective:  By signing my name below, I, Moises Blood, attest that this documentation has been prepared under the direction and in the presence of [name], MD. Electronically Signed: Moises Blood, [place]. 08/23/2017 , 1:54 PM .  Patient was seen in Room 10 .   Patient ID: Donald [name], male    DOB: 12/20/1970, 47 y.o.   MRN: [id number] Chief Complaint  Patient presents with  . Musle Pain    Both shoulders and neck. Going on for a month or two. Continuous pain on shoulders 6/10 pain and for nect pain level 4/10   HPI Donald [name] is a 47 y.o. male  Here for neck and shoulder pain. Patient was last seen on March 7th. He's had some persistent pain in his neck and bilateral shoulder pain, as well as the SCM muscle of the neck. At last visit, he did have some trapezius spasm, recommended tylenol during the day, and flexeril at bed time. C-spine xray showed loss of lordosis with minimal degenerative disc decrease at C3-4; no acute injury.   He did have an elevated CPK that was borderline elevated previously. His statin was held with some improvement of other myalgias, and CPK normalized on testing last visit at 134. Decided to remain off statins for now with possible repeat testing in 6 months. Diet and exercise to manage hyperlipidemia.   Patient states his myalgias have improved since last visit. He notes still having some soreness in his shoulders and his upper arm. If he holds phone up for a longer period of time, his left hand becomes a little numb. He reports spasms in his frontal part of his neck, and feeling a little swelling in his neck.   Patient Active Problem List   Diagnosis Date Noted  . Chest pain 01/20/2016  . Acute URI 01/20/2016  . Hypokalemia 01/20/2016  . Right bundle branch block 03/11/2010  . LUMBAGO 03/09/2010  . DYSURIA 03/09/2010  . NEPHROLITHIASIS, HX OF 12/10/2009  . Essential hypertension 05/14/2009  . PARASOMNIA 05/14/2009  . Hyperlipidemia  05/13/2009  . G E R D 05/13/2009   Past Medical History:  Diagnosis Date  . DEPRESSION, MILD   . Dysuria   . G E R D   . Hypercholesterolemia   . Hyperlipidemia    NOT TAKING MEDICATION  . Hypertension   . Lumbago   . NEPHROLITHIASIS, HX OF   . PARASOMNIA   . Right bundle branch block    Past Surgical History:  Procedure Laterality Date  . CARDIAC CATHETERIZATION N/A 01/19/2016   Procedure: Left Heart Cath and Coronary Angiography;  Surgeon: Sherren Mocha, MD;  Location: [facility];  Service: Cardiovascular;  Laterality: N/A;  . HAND SURGERY    . right and left hand     Allergies  Allergen Reactions  . Ace Inhibitors Cough    Lisinopril caused cough   Prior to Admission medications   Medication Sig Start Date End Date Taking? Authorizing Provider  amLODipine (NORVASC) 10 MG tablet Take 1 tablet (10 mg total) by mouth daily. 03/02/17   [name], MD  aspirin 81 MG tablet Take 1 tablet (81 mg total) by mouth daily. 10/29/15   Jaynee Eagles, PA-C  cyclobenzaprine (FLEXERIL) 5 MG tablet Take 0.5-1 tablets (2.5-5 mg total) by mouth at bedtime as needed. 08/09/17   [name], MD  losartan-hydrochlorothiazide (HYZAAR) 100-25 MG tablet Take 1 tablet by mouth daily. 03/02/17   [name], MD  potassium chloride (  K-DUR) 10 MEQ tablet Take 1 tablet (10 mEq total) by mouth daily. 03/02/17   [name], MD   Social History   Socioeconomic History  . Marital status: Married    Spouse name: Not on file  . Number of children: 2  . Years of education: 30  . Highest education level: Not on file  Occupational History  . Occupation: IT sales professional: [employer]  Social Needs  . Financial resource strain: Not on file  . Food insecurity:    Worry: Not on file    Inability: Not on file  . Transportation needs:    Medical: Not on file    Non-medical: Not on file  Tobacco Use  . Smoking status: Never Smoker  . Smokeless tobacco: Never  Used  Substance and Sexual Activity  . Alcohol use: No    Alcohol/week: 0.0 oz  . Drug use: No  . Sexual activity: Not on file  Lifestyle  . Physical activity:    Days per week: Not on file    Minutes per session: Not on file  . Stress: Not on file  Relationships  . Social connections:    Talks on phone: Not on file    Gets together: Not on file    Attends religious service: Not on file    Active member of club or organization: Not on file    Attends meetings of clubs or organizations: Not on file    Relationship status: Not on file  . Intimate partner violence:    Fear of current or ex partner: Not on file    Emotionally abused: Not on file    Physically abused: Not on file    Forced sexual activity: Not on file  Other Topics Concern  . Not on file  Social History Narrative   ** Merged History Encounter **       Review of Systems  Constitutional: Negative for fatigue and unexpected weight change.  Eyes: Negative for visual disturbance.  Respiratory: Negative for cough, chest tightness and shortness of breath.   Cardiovascular: Negative for chest pain, palpitations and leg swelling.  Gastrointestinal: Negative for abdominal pain and blood in stool.  Musculoskeletal: Positive for myalgias and neck pain.  Neurological: Negative for dizziness, light-headedness and headaches.       Objective:   Physical Exam  Constitutional: He is oriented to person, place, and time. He appears well-developed and well-nourished. No distress.  HENT:  Head: Normocephalic and atraumatic.  Eyes: Pupils are equal, round, and reactive to light. EOM are normal.  Neck: Neck supple. No thyroid mass and no thyromegaly present.  Some prominence of his SCM muscle at the base bilaterally, no apparent thyroid nodules  Cardiovascular: Normal rate.  Pulmonary/Chest: Effort normal. No respiratory distress.  Musculoskeletal: Normal range of motion.  Midline c-spine non tender, slight tenderness along  trapezius on the left, c-spine full flexion and extension  Lymphadenopathy:    He has no cervical adenopathy.  Neurological: He is alert and oriented to person, place, and time.  Skin: Skin is warm and dry.  Psychiatric: He has a normal mood and affect. His behavior is normal.  Nursing note and vitals reviewed.   Vitals:   08/23/17 1334  BP: 120/68  Pulse: 84  Temp: 98.3 F (36.8 C)  TempSrc: Oral  SpO2: 99%  Weight: 254 lb 6.4 oz (115.4 kg)  Height: 6\' 1"  (1.854 m)       Assessment & Plan:  Donald [name] is a 47 y.o. male DDD (degenerative disc disease), cervical Cervical paraspinal muscle spasm - Plan: Ambulatory referral to Physical Therapy Trapezius muscle spasm - Plan: cyclobenzaprine (FLEXERIL) 5 MG tablet  - improved with flexeril. Suspected spasm with mild DDD.   - trial of physical therapy, consider orthopedic evaluation if persistent symptoms, RTC precautions if worsening.  Neck fullness - Plan: US Soft Tissue Head/Neck  -Appears to be in area of the sternocleidomastoid muscle, possible spasm.  Slight fullness at the base of those areas without apparent thyroid mass.  Check ultrasound to evaluate that area further, then if persistent without source on ultrasound, consider CT or ENT evaluation  Hyperlipidemia, unspecified hyperlipidemia type  -Previous elevated CPK improved off statin.  Other myalgias also improved off statin.  Hold on further statin at this time, monitor diet, exercise and recheck levels in 3-6 months  Meds ordered this encounter  Medications  . cyclobenzaprine (FLEXERIL) 5 MG tablet    Sig: Take 0.5-1 tablets (2.5-5 mg total) by mouth at bedtime as needed.    Dispense:  15 tablet    Refill:  0   Patient Instructions   I will refer you to physical therapy for your muscle spasm and neck pain. Ok to continue flexeril before bedtime if needed, tylenol as needed during the day.   You will be called about an appointment for the neck  ultrasound to evaluate the fullness at the front neck muscles.   Continue to avoid statin medicine for now, but watch diet and exercise, with repeat testing in 3-6 months.   Return to the clinic or go to the nearest emergency room if any of your symptoms worsen or new symptoms occur.   IF you received an x-ray today, you will receive an invoice from [facility]. Please contact [facility] at [phone] with questions or concerns regarding your invoice.   IF you received labwork today, you will receive an invoice from [facility]. Please contact [facility] at [phone] with questions or concerns regarding your invoice.   Our billing staff will not be able to assist you with questions regarding bills from these companies.  You will be contacted with the lab results as soon as they are available. The fastest way to get your results is to activate your My Chart account. Instructions are located on the last page of this paperwork. If you have not heard from [place] regarding the results in 2 weeks, please contact this office.       I personally performed the services described in this documentation, which was scribed in my presence. The recorded information has been reviewed and considered for accuracy and completeness, addended by me as needed, and agree with information above.  Signed,   [name], MD [facility].  08/23/17 2:09 PM

## 2017-09-17 ENCOUNTER — Telehealth: Payer: Self-pay | Admitting: Family Medicine

## 2017-09-20 ENCOUNTER — Ambulatory Visit
Admission: RE | Admit: 2017-09-20 | Discharge: 2017-09-20 | Disposition: A | Discharge status: Home / Self Care | Payer: 59 | Source: Ambulatory Visit | Attending: Family Medicine | Admitting: Family Medicine

## 2017-09-20 DIAGNOSIS — R221 Localized swelling, mass and lump, neck: Secondary | ICD-10-CM

## 2017-09-25 ENCOUNTER — Ambulatory Visit: Payer: 59 | Admitting: Physical Therapy

## 2017-09-26 ENCOUNTER — Other Ambulatory Visit: Payer: Self-pay | Admitting: Family Medicine

## 2017-09-26 DIAGNOSIS — E785 Hyperlipidemia, unspecified: Secondary | ICD-10-CM

## 2017-10-04 ENCOUNTER — Other Ambulatory Visit: Payer: Self-pay

## 2017-10-04 ENCOUNTER — Ambulatory Visit: Payer: 59 | Attending: Family Medicine | Admitting: Physical Therapy

## 2017-10-04 DIAGNOSIS — M542 Cervicalgia: Secondary | ICD-10-CM | POA: Diagnosis not present

## 2017-10-04 NOTE — Patient Instructions (Signed)
Scapular Retraction (Standing)   With arms at sides, pinch shoulder blades together. Repeat 10 times per set. Do 1-3 sets per session. Do 2 sessions per day.  http://orth.exer.us/944     Flexibility: Neck Retraction   Pull head straight back, keeping eyes and jaw level. Hold 3-5 seconds. Repeat _10 times per set. Do 3-5  sessions per day.  http://orth.exer.us/344   Posture - Sitting   Sit upright, head facing forward. Try using a roll to support lower back. Keep shoulders relaxed, and avoid rounded back. Keep hips level with knees. Avoid crossing legs for long periods.   Flexibility: Corner Stretch   Standing in corner or a doorway with hands just above shoulder level.  Lean forward until a comfortable stretch is felt across chest. Hold __30__ seconds. Repeat __3__ times per set.  Do _2___ sessions per day.  http://orth.exer.us/342   Copyright  VHI. All rights reserved.   Madelyn Flavors, PT 10/04/17 9:24 AM; [place]

## 2017-10-04 NOTE — Therapy (Signed)
[place] [address] [place], [place], [address] Phone: [phone]   Fax:  [phone]  Physical Therapy Evaluation  Patient Details  Name: Donald [name] MRN: [id number] Date of Birth: November 17, 1970 Referring Provider: [name], MD   Encounter Date: 10/04/2017  PT End of Session - 10/04/17 0849    Visit Number  1    Number of Visits  8    Date for PT Re-Evaluation  11/01/17    PT Start Time  2671    PT Stop Time  0938    PT Time Calculation (min)  51 min    Activity Tolerance  Patient tolerated treatment well    Behavior During Therapy  [facility] for tasks assessed/performed       Past Medical History:  Diagnosis Date  . DEPRESSION, MILD   . Dysuria   . G E R D   . Hypercholesterolemia   . Hyperlipidemia    NOT TAKING MEDICATION  . Hypertension   . Lumbago   . NEPHROLITHIASIS, HX OF   . PARASOMNIA   . Right bundle branch block     Past Surgical History:  Procedure Laterality Date  . CARDIAC CATHETERIZATION N/A 01/19/2016   Procedure: Left Heart Cath and Coronary Angiography;  Surgeon: Sherren Mocha, MD;  Location: [facility];  Service: Cardiovascular;  Laterality: N/A;  . HAND SURGERY    . right and left hand      There were no vitals filed for this visit.   Subjective Assessment - 10/04/17 0849    Subjective  Patient reports increasse in neck pain in the past two months. Pain moves into both shoulders.     Pertinent History  HTN    Diagnostic tests  xrays - DDD C3/4    Patient Stated Goals  get out of pain    Currently in Pain?  Yes    Pain Score  4     Pain Location  Neck    Pain Orientation  Right    Pain Descriptors / Indicators  Sharp    Pain Type  Chronic pain    Pain Onset  More than a month ago    Pain Frequency  Constant    Aggravating Factors   unsure    Pain Relieving Factors  some meds         [facility] PT Assessment - 10/04/17 0001      Assessment   Medical  Diagnosis  paraspinal neck pain/spasm; DDD c3-4    Referring Provider  [name], MD    Onset Date/Surgical Date  08/04/17      Precautions   Precautions  None      Balance Screen   Has the patient fallen in the past 6 months  No    Has the patient had a decrease in activity level because of a fear of falling?   No    Is the patient reluctant to leave their home because of a fear of falling?   No      Prior Function   Vocation  Full time employment    Electronics engineer; on computer; working with hands      Observation/Other Assessments   Focus on Therapeutic Outcomes (FOTO)   28% limited      Posture/Postural Control   Posture/Postural Control  Postural limitations    Postural Limitations  Rounded Shoulders;Forward head      ROM / Strength   AROM /  PROM / Strength  AROM;Strength      AROM   Overall AROM Comments  Full cervical and shoulder ROM      Strength   Overall Strength Comments  Bil shoulders 5/5; cervical grossly 4+/5      Palpation   Palpation comment  marked tenderness of bil levator scapulae, UT, left pecs, cervical paraspinals and subocciptials                Objective measurements completed on examination: See above findings.      [facility] Adult PT Treatment/Exercise - 10/04/17 0001      Modalities   Modalities  Electrical Stimulation;Moist Heat      Moist Heat Therapy   Number Minutes Moist Heat  15 Minutes    Moist Heat Location  Cervical      Electrical Stimulation   Electrical Stimulation Location  Bil neck and shoulders IFC 80-150 Hz x 15 min    Electrical Stimulation Goals  Pain             PT Education - 10/04/17 0950    Education provided  Yes    Education Details  HEP; postural education    Person(s) Educated  Patient    Methods  Explanation;Demonstration;Handout;Verbal cues    Comprehension  Verbalized understanding;Returned demonstration          PT Long Term Goals - 10/04/17 0951      PT  LONG TERM GOAL #1   Title  I with HEP    Time  4    Period  Weeks    Status  New      PT LONG TERM GOAL #2   Title  Patient able to perform ADLS with 0/35 pain or less.    Time  4    Period  Weeks    Status  New      PT LONG TERM GOAL #3   Title  Patient able to verbalize and demonstrate importance of correct posture in preventing neck pain.    Time  4    Period  Weeks    Status  New             Plan - 10/04/17 0930    Clinical Impression Statement  Patient presents for low complexity evaluation for neck pain. He has full ROM and in the neck and Bil shoulders, however demonstrates significant postural deficits. He has pain in Bil levator scapula, upper traps and paraspinals and tight suboccipitals. Patient will benefit from PT to address these deficits.    History and Personal Factors relevant to plan of care:  HTN, high cholesterol    Clinical Presentation  Stable    Clinical Decision Making  Low    Rehab Potential  Excellent    PT Frequency  2x / week    PT Duration  4 weeks    PT Treatment/Interventions  ADLs/Self Care Home Management;Electrical Stimulation;Moist Heat;Ultrasound;Therapeutic exercise;Neuromuscular re-education;Patient/family education;Manual techniques;Dry needling;Taping    PT Next Visit Plan  Reveiw HEP, postural strengthening and education; manual therapy to UT, LS, paraspinals and suboccipitals; DN prn; modalities prn;     PT Home Exercise Plan  cerv and scap retraction; pec stretch    Consulted and Agree with Plan of Care  Patient       Patient will benefit from skilled therapeutic intervention in order to improve the following deficits and impairments:  Pain, Decreased strength, Postural dysfunction  Visit Diagnosis: Cervicalgia - Plan: PT plan of care cert/re-cert  Problem List Patient Active Problem List   Diagnosis Date Noted  . Chest pain 01/20/2016  . Acute URI 01/20/2016  . Hypokalemia 01/20/2016  . Right bundle branch block  03/11/2010  . LUMBAGO 03/09/2010  . DYSURIA 03/09/2010  . NEPHROLITHIASIS, HX OF 12/10/2009  . Essential hypertension 05/14/2009  . PARASOMNIA 05/14/2009  . Hyperlipidemia 05/13/2009  . G E R D 05/13/2009    Donald [name] PT 10/04/2017, 9:55 AM  [place] [address] [place], [place], [address] Phone: [phone]   Fax:  [phone]  Name: Donald [name] MRN: [id number] Date of Birth: 06/26/1970

## 2017-10-12 ENCOUNTER — Ambulatory Visit: Payer: 59 | Admitting: Physical Therapy

## 2017-10-12 ENCOUNTER — Encounter: Payer: Self-pay | Admitting: Physical Therapy

## 2017-10-12 DIAGNOSIS — M542 Cervicalgia: Secondary | ICD-10-CM | POA: Diagnosis not present

## 2017-10-12 NOTE — Therapy (Signed)
[place], [place], [address] Phone: [phone]   Fax:  [phone]  Physical Therapy Treatment  Patient Details  Name: Donald [name] MRN: [id number] Date of Birth: May 10, 1971 Referring Provider: [name], MD   Encounter Date: 10/12/2017  PT End of Session - 10/12/17 1050    Visit Number  2    Number of Visits  8    Date for PT Re-Evaluation  11/01/17    PT Start Time  1015    PT Stop Time  1110    PT Time Calculation (min)  55 min    Activity Tolerance  Patient tolerated treatment well    Behavior During Therapy  [facility] for tasks assessed/performed       Past Medical History:  Diagnosis Date  . DEPRESSION, MILD   . Dysuria   . G E R D   . Hypercholesterolemia   . Hyperlipidemia    NOT TAKING MEDICATION  . Hypertension   . Lumbago   . NEPHROLITHIASIS, HX OF   . PARASOMNIA   . Right bundle branch block     Past Surgical History:  Procedure Laterality Date  . CARDIAC CATHETERIZATION N/A 01/19/2016   Procedure: Left Heart Cath and Coronary Angiography;  Surgeon: Sherren Mocha, MD;  Location: [facility];  Service: Cardiovascular;  Laterality: N/A;  . HAND SURGERY    . right and left hand      There were no vitals filed for this visit.  Subjective Assessment - 10/12/17 1014    Subjective  Pt reports no change since eval. "No to bad"    Pain Score  5     Pain Location  Neck    Pain Orientation  Anterior;Posterior                       [facility] Adult PT Treatment/Exercise - 10/12/17 0001      Exercises   Exercises  Neck      Neck Exercises: Machines for Strengthening   UBE (Upper Arm Bike)  L2 56fwd/3rev    Cybex Row  20lb 2x10     Lat Pull  20lb 2x10       Neck Exercises: Theraband   Shoulder Extension  20 reps;Red    Shoulder External Rotation  Red;20 reps      Neck Exercises: Supine   Neck Retraction  10 reps;3 secs 3 way      Modalities   Modalities  Electrical Stimulation;Moist Heat      Moist Heat Therapy   Number Minutes Moist Heat  15 Minutes    Moist Heat Location  Cervical      Electrical Stimulation   Electrical Stimulation Location  Bil neck and shoulders IFC 80-150 Hz x 15 min    Electrical Stimulation Action  IFC    Electrical Stimulation Parameters  Sitting to pt tolerance    Electrical Stimulation Goals  Pain      Manual Therapy   Manual Therapy  Myofascial release;Passive ROM;Manual Traction;Soft tissue mobilization    Manual therapy comments  Trigger point noted in upper L trap    Soft tissue mobilization  posterior para spinalel into upper traps     Passive ROM  cervical spine all dorections    Manual Traction  cervica spine 4x 15 sec                  PT Long Term Goals -  10/12/17 1053      PT LONG TERM GOAL #1   Title  I with HEP    Status  Achieved            Plan - 10/12/17 1051    Clinical Impression Statement  Pt tolerated an initial progression to Te and MT well. Trigger point noted in L upper Trap with MT. He does reports a minor increase in pain with UBE warm up. All exercises performs well with good form and posture.     Rehab Potential  Excellent    PT Frequency  2x / week    PT Duration  4 weeks    PT Treatment/Interventions  ADLs/Self Care Home Management;Electrical Stimulation;Moist Heat;Ultrasound;Therapeutic exercise;Neuromuscular re-education;Patient/family education;Manual techniques;Dry needling;Taping    PT Next Visit Plan  Review HEP, postural strengthening and education; manual therapy to UT, LS, paraspinals and suboccipitals; DN prn; modalities prn;        Patient will benefit from skilled therapeutic intervention in order to improve the following deficits and impairments:  Pain, Decreased strength, Postural dysfunction  Visit Diagnosis: Cervicalgia     Problem List Patient Active Problem List   Diagnosis Date Noted  . Chest pain  01/20/2016  . Acute URI 01/20/2016  . Hypokalemia 01/20/2016  . Right bundle branch block 03/11/2010  . LUMBAGO 03/09/2010  . DYSURIA 03/09/2010  . NEPHROLITHIASIS, HX OF 12/10/2009  . Essential hypertension 05/14/2009  . PARASOMNIA 05/14/2009  . Hyperlipidemia 05/13/2009  . Marcy Salvo D 05/13/2009    Scot Jun 10/12/2017, 10:53 AM  [place], [place], [address] Phone: [phone]   Fax:  [phone]  Name: Donald [name] MRN: [id number] Date of Birth: [birth year]

## 2017-10-19 ENCOUNTER — Ambulatory Visit: Payer: 59 | Admitting: Physical Therapy

## 2017-10-23 ENCOUNTER — Ambulatory Visit: Payer: 59 | Admitting: Physical Therapy

## 2017-11-03 ENCOUNTER — Other Ambulatory Visit: Payer: Self-pay | Admitting: Family Medicine

## 2017-11-03 DIAGNOSIS — I10 Essential (primary) hypertension: Secondary | ICD-10-CM

## 2017-11-15 ENCOUNTER — Other Ambulatory Visit: Payer: Self-pay | Admitting: Family Medicine

## 2017-11-15 DIAGNOSIS — I10 Essential (primary) hypertension: Secondary | ICD-10-CM

## 2017-11-27 ENCOUNTER — Ambulatory Visit: Payer: 59 | Admitting: Emergency Medicine

## 2017-11-27 ENCOUNTER — Encounter: Payer: Self-pay | Admitting: Family Medicine

## 2017-11-29 ENCOUNTER — Ambulatory Visit (INDEPENDENT_AMBULATORY_CARE_PROVIDER_SITE_OTHER): Payer: 59 | Admitting: Family Medicine

## 2017-11-29 ENCOUNTER — Other Ambulatory Visit: Payer: Self-pay

## 2017-11-29 ENCOUNTER — Encounter: Payer: Self-pay | Admitting: Family Medicine

## 2017-11-29 VITALS — BP 118/66 | HR 76 | Temp 98.0°F | Ht 73.0 in | Wt 254.2 lb

## 2017-11-29 DIAGNOSIS — E785 Hyperlipidemia, unspecified: Secondary | ICD-10-CM | POA: Diagnosis not present

## 2017-11-29 DIAGNOSIS — Z13220 Encounter for screening for lipoid disorders: Secondary | ICD-10-CM

## 2017-11-29 DIAGNOSIS — I10 Essential (primary) hypertension: Secondary | ICD-10-CM | POA: Diagnosis not present

## 2017-11-29 DIAGNOSIS — J019 Acute sinusitis, unspecified: Secondary | ICD-10-CM | POA: Diagnosis not present

## 2017-11-29 DIAGNOSIS — J309 Allergic rhinitis, unspecified: Secondary | ICD-10-CM

## 2017-11-29 MED ORDER — AMLODIPINE BESYLATE 10 MG PO TABS: 10.0000 mg | ORAL_TABLET | Freq: Every day | ORAL | 1 refills | Status: DC

## 2017-11-29 MED ORDER — LOSARTAN POTASSIUM-HCTZ 100-25 MG PO TABS: 1.0000 | ORAL_TABLET | Freq: Every day | ORAL | 1 refills | Status: DC

## 2017-11-29 MED ORDER — AMOXICILLIN-POT CLAVULANATE 875-125 MG PO TABS: 1.0000 | ORAL_TABLET | Freq: Two times a day (BID) | ORAL | 0 refills | Status: DC

## 2017-11-29 NOTE — Patient Instructions (Addendum)
Cut back on sweet tea and exercise most days per week to help with cholesterol and weight loss. Recheck in 3-4 months for labs.   Nasal congestion may be related to allergies, but now with possible sinus infection. Ok to start antibiotic, but also use saline nasal spray, then try starting flonase spray 1-2 sprays per nostril daily with technique we discussed for allergies. Return to the clinic or go to the nearest emergency room if any of your symptoms worsen or new symptoms occur.  Sinusitis, Adult Sinusitis is soreness and inflammation of your sinuses. Sinuses are hollow spaces in the bones around your face. Your sinuses are located:  Around your eyes.  In the middle of your forehead.  Behind your nose.  In your cheekbones.  Your sinuses and nasal passages are lined with a stringy fluid (mucus). Mucus normally drains out of your sinuses. When your nasal tissues become inflamed or swollen, the mucus can become trapped or blocked so air cannot flow through your sinuses. This allows bacteria, viruses, and funguses to grow, which leads to infection. Sinusitis can develop quickly and last for 7?10 days (acute) or for more than 12 weeks (chronic). Sinusitis often develops after a cold. What are the causes? This condition is caused by anything that creates swelling in the sinuses or stops mucus from draining, including:  Allergies.  Asthma.  Bacterial or viral infection.  Abnormally shaped bones between the nasal passages.  Nasal growths that contain mucus (nasal polyps).  Narrow sinus openings.  Pollutants, such as chemicals or irritants in the air.  A foreign object stuck in the nose.  A fungal infection. This is rare.  What increases the risk? The following factors may make you more likely to develop this condition:  Having allergies or asthma.  Having had a recent cold or respiratory tract infection.  Having structural deformities or blockages in your nose or  sinuses.  Having a weak immune system.  Doing a lot of swimming or diving.  Overusing nasal sprays.  Smoking.  What are the signs or symptoms? The main symptoms of this condition are pain and a feeling of pressure around the affected sinuses. Other symptoms include:  Upper toothache.  Earache.  Headache.  Bad breath.  Decreased sense of smell and taste.  A cough that may get worse at night.  Fatigue.  Fever.  Thick drainage from your nose. The drainage is often green and it may contain pus (purulent).  Stuffy nose or congestion.  Postnasal drip. This is when extra mucus collects in the throat or back of the nose.  Swelling and warmth over the affected sinuses.  Sore throat.  Sensitivity to light.  How is this diagnosed? This condition is diagnosed based on symptoms, a medical history, and a physical exam. To find out if your condition is acute or chronic, your health care provider may:  Look in your nose for signs of nasal polyps.  Tap over the affected sinus to check for signs of infection.  View the inside of your sinuses using an imaging device that has a light attached (endoscope).  If your health care provider suspects that you have chronic sinusitis, you may also:  Be tested for allergies.  Have a sample of mucus taken from your nose (nasal culture) and checked for bacteria.  Have a mucus sample examined to see if your sinusitis is related to an allergy.  If your sinusitis does not respond to treatment and it lasts longer than 8 weeks, you  may have an MRI or CT scan to check your sinuses. These scans also help to determine how severe your infection is. In rare cases, a bone biopsy may be done to rule out more serious types of fungal sinus disease. How is this treated? Treatment for sinusitis depends on the cause and whether your condition is chronic or acute. If a virus is causing your sinusitis, your symptoms will go away on their own within 10  days. You may be given medicines to relieve your symptoms, including:  Topical nasal decongestants. They shrink swollen nasal passages and let mucus drain from your sinuses.  Antihistamines. These drugs block inflammation that is triggered by allergies. This can help to ease swelling in your nose and sinuses.  Topical nasal corticosteroids. These are nasal sprays that ease inflammation and swelling in your nose and sinuses.  Nasal saline washes. These rinses can help to get rid of thick mucus in your nose.  If your condition is caused by bacteria, you will be given an antibiotic medicine. If your condition is caused by a fungus, you will be given an antifungal medicine. Surgery may be needed to correct underlying conditions, such as narrow nasal passages. Surgery may also be needed to remove polyps. Follow these instructions at home: Medicines  Take, use, or apply over-the-counter and prescription medicines only as told by your health care provider. These may include nasal sprays.  If you were prescribed an antibiotic medicine, take it as told by your health care provider. Do not stop taking the antibiotic even if you start to feel better. Hydrate and Humidify  Drink enough water to keep your urine clear or pale yellow. Staying hydrated will help to thin your mucus.  Use a cool mist humidifier to keep the humidity level in your home above 50%.  Inhale steam for 10-15 minutes, 3-4 times a day or as told by your health care provider. You can do this in the bathroom while a hot shower is running.  Limit your exposure to cool or dry air. Rest  Rest as much as possible.  Sleep with your head raised (elevated).  Make sure to get enough sleep each night. General instructions  Apply a warm, moist washcloth to your face 3-4 times a day or as told by your health care provider. This will help with discomfort.  Wash your hands often with soap and water to reduce your exposure to viruses and  other germs. If soap and water are not available, use hand sanitizer.  Do not smoke. Avoid being around people who are smoking (secondhand smoke).  Keep all follow-up visits as told by your health care provider. This is important. Contact a health care provider if:  You have a fever.  Your symptoms get worse.  Your symptoms do not improve within 10 days. Get help right away if:  You have a severe headache.  You have persistent vomiting.  You have pain or swelling around your face or eyes.  You have vision problems.  You develop confusion.  Your neck is stiff.  You have trouble breathing. This information is not intended to replace advice given to you by your health care provider. Make sure you discuss any questions you have with your health care provider. Document Released: 05/22/2005 Document Revised: 01/16/2016 Document Reviewed: 03/17/2015 Elsevier Interactive Patient Education  2018 [employer].    IF you received an x-ray today, you will receive an invoice from [facility]. Please contact [facility] at [phone] with questions  or concerns regarding your invoice.   IF you received labwork today, you will receive an invoice from [facility]. Please contact [facility] at [phone] with questions or concerns regarding your invoice.   Our billing staff will not be able to assist you with questions regarding bills from these companies.  You will be contacted with the lab results as soon as they are available. The fastest way to get your results is to activate your My Chart account. Instructions are located on the last page of this paperwork. If you have not heard from [place] regarding the results in 2 weeks, please contact this office.

## 2017-11-29 NOTE — Progress Notes (Signed)
Subjective:  By signing my name below, I, Donald [name], attest that this documentation has been prepared under the direction and in the presence of [name], MD Electronically Signed: Ladene Artist, ED Scribe 11/29/2017 at 9:18 AM.   Patient ID: Donald [name], male    DOB: 1970-12-19, 47 y.o.   MRN: [id number]  Chief Complaint  Patient presents with  . Hyperlipidemia    refill on BP and Cholestrol meds for 90 days   . Nasal Congestion    stuffy nose x2 days (allergies)   HPI Donald [name] is a 47 y.o. male who presents to Primary Care at [facility] for f/u. Pt is fasting at this visit.  Hyperlipidemia Lab Results  Component Value Date   CHOL 226 (H) 08/09/2017   HDL 43 08/09/2017   LDLCALC 156 (H) 08/09/2017   TRIG 137 08/09/2017   CHOLHDL 5.3 (H) 08/09/2017   Lab Results  Component Value Date   ALT 15 08/09/2017   AST 14 08/09/2017   ALKPHOS 72 08/09/2017   BILITOT 0.7 08/09/2017  Elevated CPK with myalgias that improved with cessation of statin. CPK normalized at 134. Decided to remain off statin with rpt testing in 6 months, stressed diet and exercise. - States he has been dieting and exercising 3 days/wk. Denies eating fast food but is drinking sweet tea some days, no sodas. He reports myalgias have improved off statin.  HTN BP Readings from Last 3 Encounters:  11/29/17 118/66  08/23/17 120/68  08/09/17 100/64   Lab Results  Component Value Date   CREATININE 0.88 08/09/2017  Losartan-HCTZ and amlodipine.  Allergies Pt reports intermittent nasal congestion x 2 months, yellow/green colored rhinorrhea x 2-3 days. Pt reports associated sinus HA and postnasal drip x 2 days. Reports antihistamines and nasal spray that he used for 2 months are no longer effective. Denies cp, sob.  Patient Active Problem List   Diagnosis Date Noted  . Chest pain 01/20/2016  . Acute URI 01/20/2016  . Hypokalemia 01/20/2016  . Right bundle branch block 03/11/2010  .  LUMBAGO 03/09/2010  . DYSURIA 03/09/2010  . NEPHROLITHIASIS, HX OF 12/10/2009  . Essential hypertension 05/14/2009  . PARASOMNIA 05/14/2009  . Hyperlipidemia 05/13/2009  . G E R D 05/13/2009   Past Medical History:  Diagnosis Date  . DEPRESSION, MILD   . Dysuria   . G E R D   . Hypercholesterolemia   . Hyperlipidemia    NOT TAKING MEDICATION  . Hypertension   . Lumbago   . NEPHROLITHIASIS, HX OF   . PARASOMNIA   . Right bundle branch block    Past Surgical History:  Procedure Laterality Date  . CARDIAC CATHETERIZATION N/A 01/19/2016   Procedure: Left Heart Cath and Coronary Angiography;  Surgeon: Sherren Mocha, MD;  Location: [facility];  Service: Cardiovascular;  Laterality: N/A;  . HAND SURGERY    . right and left hand     Allergies  Allergen Reactions  . Ace Inhibitors Cough    Lisinopril caused cough   Prior to Admission medications   Medication Sig Start Date End Date Taking? Authorizing Provider  amLODipine (NORVASC) 10 MG tablet Take 1 tablet (10 mg total) by mouth daily. 03/02/17  Yes [name], MD  aspirin 81 MG tablet Take 1 tablet (81 mg total) by mouth daily. 10/29/15  Yes Jaynee Eagles, PA-C  losartan-hydrochlorothiazide (HYZAAR) 100-25 MG tablet TAKE 1 TABLET BY MOUTH EVERY DAY 11/03/17  Yes [name], MD  Social History   Socioeconomic History  . Marital status: Married    Spouse name: Not on file  . Number of children: 2  . Years of education: 52  . Highest education level: Not on file  Occupational History  . Occupation: IT sales professional: [employer]  Social Needs  . Financial resource strain: Not on file  . Food insecurity:    Worry: Not on file    Inability: Not on file  . Transportation needs:    Medical: Not on file    Non-medical: Not on file  Tobacco Use  . Smoking status: Never Smoker  . Smokeless tobacco: Never Used  Substance and Sexual Activity  . Alcohol use: No    Alcohol/week: 0.0 oz  .  Drug use: No  . Sexual activity: Not on file  Lifestyle  . Physical activity:    Days per week: Not on file    Minutes per session: Not on file  . Stress: Not on file  Relationships  . Social connections:    Talks on phone: Not on file    Gets together: Not on file    Attends religious service: Not on file    Active member of club or organization: Not on file    Attends meetings of clubs or organizations: Not on file    Relationship status: Not on file  . Intimate partner violence:    Fear of current or ex partner: Not on file    Emotionally abused: Not on file    Physically abused: Not on file    Forced sexual activity: Not on file  Other Topics Concern  . Not on file  Social History Narrative   ** Merged History Encounter **       Review of Systems  HENT: Positive for congestion, postnasal drip and rhinorrhea.   Respiratory: Negative for shortness of breath.   Cardiovascular: Negative for chest pain.  Musculoskeletal: Negative for myalgias (improved).  Allergic/Immunologic: Positive for environmental allergies.  Neurological: Positive for headaches.      Objective:   Physical Exam  Constitutional: He is oriented to person, place, and time. He appears well-developed and well-nourished.  HENT:  Head: Normocephalic and atraumatic.  Right Ear: Tympanic membrane, external ear and ear canal normal.  Left Ear: Tympanic membrane, external ear and ear canal normal.  Nose: Mucosal edema (mild, L greater than R turbinate) present. No rhinorrhea. Right sinus exhibits no maxillary sinus tenderness and no frontal sinus tenderness. Left sinus exhibits no maxillary sinus tenderness and no frontal sinus tenderness.  Mouth/Throat: Oropharynx is clear and moist and mucous membranes are normal. No oropharyngeal exudate or posterior oropharyngeal erythema.  Nose: no active discharge. Describes maxillary sinuses as area of prior pain.  Eyes: Pupils are equal, round, and reactive to light.  Conjunctivae and EOM are normal.  Neck: Neck supple. No JVD present. Carotid bruit is not present.  Cardiovascular: Normal rate, regular rhythm, normal heart sounds and intact distal pulses.  No murmur heard. Pulmonary/Chest: Effort normal and breath sounds normal. He has no wheezes. He has no rhonchi. He has no rales.  Abdominal: Soft. There is no tenderness.  Musculoskeletal: He exhibits no edema.  Lymphadenopathy:    He has no cervical adenopathy.  Neurological: He is alert and oriented to person, place, and time.  Skin: Skin is warm and dry. No rash noted.  Psychiatric: He has a normal mood and affect. His behavior is normal.  Vitals reviewed.  Vitals:   11/29/17 0834  BP: 118/66  Pulse: 76  Temp: 98 F (36.7 C)  TempSrc: Oral  SpO2: 98%  Weight: 254 lb 3.2 oz (115.3 kg)  Height: 6\' 1"  (1.854 m)      Assessment & Plan:  Aadhav Uhlig is a 47 y.o. male Hyperlipidemia, unspecified hyperlipidemia type  -Off statin currently due to myalgias previously as well as mild elevated CPK.  Weight is seen today, doubt there would be significant change in lipids at this point.  We will give him another few months to make some dietary and exercise changes with repeat testing at that time.  Continue to hold on statin at this point  Essential hypertension - Plan: amLODipine (NORVASC) 10 MG tablet, losartan-hydrochlorothiazide (HYZAAR) 100-25 MG tablet  -Stable, tolerating current med regimen.  Plan on labs in 3 to 4 months, meds refilled.  Acute sinusitis, recurrence not specified, unspecified location - Plan: amoxicillin-clavulanate (AUGMENTIN) 875-125 MG tablet Allergic rhinitis, unspecified seasonality, unspecified trigger  -Suspected allergies with secondary bacterial sinusitis.   -Start Augmentin, side effects discussed.  Symptomatic care with saline nasal spray, then start Flonase nasal spray with correct technique discussed. RTC precautions   Meds ordered this encounter    Medications  . amLODipine (NORVASC) 10 MG tablet    Sig: Take 1 tablet (10 mg total) by mouth daily.    Dispense:  90 tablet    Refill:  1  . losartan-hydrochlorothiazide (HYZAAR) 100-25 MG tablet    Sig: Take 1 tablet by mouth daily.    Dispense:  90 tablet    Refill:  1  . amoxicillin-clavulanate (AUGMENTIN) 875-125 MG tablet    Sig: Take 1 tablet by mouth 2 (two) times daily.    Dispense:  20 tablet    Refill:  0   Patient Instructions   Cut back on sweet tea and exercise most days per week to help with cholesterol and weight loss. Recheck in 3-4 months for labs.   Nasal congestion may be related to allergies, but now with possible sinus infection. Ok to start antibiotic, but also use saline nasal spray, then try starting flonase spray 1-2 sprays per nostril daily with technique we discussed for allergies. Return to the clinic or go to the nearest emergency room if any of your symptoms worsen or new symptoms occur.  Sinusitis, Adult Sinusitis is soreness and inflammation of your sinuses. Sinuses are hollow spaces in the bones around your face. Your sinuses are located:  Around your eyes.  In the middle of your forehead.  Behind your nose.  In your cheekbones.  Your sinuses and nasal passages are lined with a stringy fluid (mucus). Mucus normally drains out of your sinuses. When your nasal tissues become inflamed or swollen, the mucus can become trapped or blocked so air cannot flow through your sinuses. This allows bacteria, viruses, and funguses to grow, which leads to infection. Sinusitis can develop quickly and last for 7?10 days (acute) or for more than 12 weeks (chronic). Sinusitis often develops after a cold. What are the causes? This condition is caused by anything that creates swelling in the sinuses or stops mucus from draining, including:  Allergies.  Asthma.  Bacterial or viral infection.  Abnormally shaped bones between the nasal passages.  Nasal growths  that contain mucus (nasal polyps).  Narrow sinus openings.  Pollutants, such as chemicals or irritants in the air.  A foreign object stuck in the nose.  A fungal infection. This is rare.  What  increases the risk? The following factors may make you more likely to develop this condition:  Having allergies or asthma.  Having had a recent cold or respiratory tract infection.  Having structural deformities or blockages in your nose or sinuses.  Having a weak immune system.  Doing a lot of swimming or diving.  Overusing nasal sprays.  Smoking.  What are the signs or symptoms? The main symptoms of this condition are pain and a feeling of pressure around the affected sinuses. Other symptoms include:  Upper toothache.  Earache.  Headache.  Bad breath.  Decreased sense of smell and taste.  A cough that may get worse at night.  Fatigue.  Fever.  Thick drainage from your nose. The drainage is often green and it may contain pus (purulent).  Stuffy nose or congestion.  Postnasal drip. This is when extra mucus collects in the throat or back of the nose.  Swelling and warmth over the affected sinuses.  Sore throat.  Sensitivity to light.  How is this diagnosed? This condition is diagnosed based on symptoms, a medical history, and a physical exam. To find out if your condition is acute or chronic, your health care provider may:  Look in your nose for signs of nasal polyps.  Tap over the affected sinus to check for signs of infection.  View the inside of your sinuses using an imaging device that has a light attached (endoscope).  If your health care provider suspects that you have chronic sinusitis, you may also:  Be tested for allergies.  Have a sample of mucus taken from your nose (nasal culture) and checked for bacteria.  Have a mucus sample examined to see if your sinusitis is related to an allergy.  If your sinusitis does not respond to treatment and it  lasts longer than 8 weeks, you may have an MRI or CT scan to check your sinuses. These scans also help to determine how severe your infection is. In rare cases, a bone biopsy may be done to rule out more serious types of fungal sinus disease. How is this treated? Treatment for sinusitis depends on the cause and whether your condition is chronic or acute. If a virus is causing your sinusitis, your symptoms will go away on their own within 10 days. You may be given medicines to relieve your symptoms, including:  Topical nasal decongestants. They shrink swollen nasal passages and let mucus drain from your sinuses.  Antihistamines. These drugs block inflammation that is triggered by allergies. This can help to ease swelling in your nose and sinuses.  Topical nasal corticosteroids. These are nasal sprays that ease inflammation and swelling in your nose and sinuses.  Nasal saline washes. These rinses can help to get rid of thick mucus in your nose.  If your condition is caused by bacteria, you will be given an antibiotic medicine. If your condition is caused by a fungus, you will be given an antifungal medicine. Surgery may be needed to correct underlying conditions, such as narrow nasal passages. Surgery may also be needed to remove polyps. Follow these instructions at home: Medicines  Take, use, or apply over-the-counter and prescription medicines only as told by your health care provider. These may include nasal sprays.  If you were prescribed an antibiotic medicine, take it as told by your health care provider. Do not stop taking the antibiotic even if you start to feel better. Hydrate and Humidify  Drink enough water to keep your urine clear or pale yellow.  Staying hydrated will help to thin your mucus.  Use a cool mist humidifier to keep the humidity level in your home above 50%.  Inhale steam for 10-15 minutes, 3-4 times a day or as told by your health care provider. You can do this in  the bathroom while a hot shower is running.  Limit your exposure to cool or dry air. Rest  Rest as much as possible.  Sleep with your head raised (elevated).  Make sure to get enough sleep each night. General instructions  Apply a warm, moist washcloth to your face 3-4 times a day or as told by your health care provider. This will help with discomfort.  Wash your hands often with soap and water to reduce your exposure to viruses and other germs. If soap and water are not available, use hand sanitizer.  Do not smoke. Avoid being around people who are smoking (secondhand smoke).  Keep all follow-up visits as told by your health care provider. This is important. Contact a health care provider if:  You have a fever.  Your symptoms get worse.  Your symptoms do not improve within 10 days. Get help right away if:  You have a severe headache.  You have persistent vomiting.  You have pain or swelling around your face or eyes.  You have vision problems.  You develop confusion.  Your neck is stiff.  You have trouble breathing. This information is not intended to replace advice given to you by your health care provider. Make sure you discuss any questions you have with your health care provider. Document Released: 05/22/2005 Document Revised: 01/16/2016 Document Reviewed: 03/17/2015 Elsevier Interactive Patient Education  2018 [employer].    IF you received an x-ray today, you will receive an invoice from [facility]. Please contact [facility] at [phone] with questions or concerns regarding your invoice.   IF you received labwork today, you will receive an invoice from [facility]. Please contact [facility] at [phone] with questions or concerns regarding your invoice.   Our billing staff will not be able to assist you with questions regarding bills from these companies.  You will be contacted with the lab results as soon as they are available. The  fastest way to get your results is to activate your My Chart account. Instructions are located on the last page of this paperwork. If you have not heard from [place] regarding the results in 2 weeks, please contact this office.       I personally performed the services described in this documentation, which was scribed in my presence. The recorded information has been reviewed and considered for accuracy and completeness, addended by me as needed, and agree with information above.  Signed,   [name], MD Primary [facility] at [place].  11/29/17 9:37 AM

## 2017-12-02 ENCOUNTER — Other Ambulatory Visit: Payer: Self-pay | Admitting: Family Medicine

## 2017-12-02 DIAGNOSIS — E785 Hyperlipidemia, unspecified: Secondary | ICD-10-CM

## 2018-02-28 ENCOUNTER — Encounter: Payer: Self-pay | Admitting: Physical Therapy

## 2018-02-28 ENCOUNTER — Encounter: Payer: Self-pay | Admitting: Family Medicine

## 2018-02-28 ENCOUNTER — Ambulatory Visit: Payer: 59 | Admitting: Family Medicine

## 2018-02-28 ENCOUNTER — Other Ambulatory Visit: Payer: Self-pay

## 2018-02-28 VITALS — BP 133/82 | HR 67 | Temp 97.9°F | Ht 72.0 in | Wt 252.8 lb

## 2018-02-28 DIAGNOSIS — M79622 Pain in left upper arm: Secondary | ICD-10-CM

## 2018-02-28 DIAGNOSIS — E785 Hyperlipidemia, unspecified: Secondary | ICD-10-CM | POA: Diagnosis not present

## 2018-02-28 DIAGNOSIS — M62838 Other muscle spasm: Secondary | ICD-10-CM

## 2018-02-28 DIAGNOSIS — M791 Myalgia, unspecified site: Secondary | ICD-10-CM | POA: Diagnosis not present

## 2018-02-28 DIAGNOSIS — I10 Essential (primary) hypertension: Secondary | ICD-10-CM | POA: Diagnosis not present

## 2018-02-28 MED ORDER — CYCLOBENZAPRINE HCL 5 MG PO TABS: ORAL_TABLET | ORAL | 0 refills | Status: DC

## 2018-02-28 MED ORDER — AMLODIPINE BESYLATE 10 MG PO TABS: 10.0000 mg | ORAL_TABLET | Freq: Every day | ORAL | 1 refills | Status: DC

## 2018-02-28 MED ORDER — LOSARTAN POTASSIUM-HCTZ 100-25 MG PO TABS: 1.0000 | ORAL_TABLET | Freq: Every day | ORAL | 1 refills | Status: DC

## 2018-02-28 NOTE — Progress Notes (Signed)
Subjective:  By signing my name below, I, Donald [name], attest that this documentation has been prepared under the direction and in the presence of Donald Agreste, MD Electronically Signed: Ladene Artist, ED Scribe 02/28/2018 at 8:24 AM.   Patient ID: Donald [name], male    DOB: 1970-10-07, 47 y.o.   MRN: [id number]  Chief Complaint  Patient presents with  . Hypertension    f/u  . Hyperlipidemia   HPI Donald [name] is a 47 y.o. male who presents to Primary Care at [facility] for f/u. Pt is fasting at this OV.  HTN Lab Results  Component Value Date   CREATININE 0.88 08/09/2017   BP Readings from Last 3 Encounters:  02/28/18 133/82  11/29/17 118/66  08/23/17 120/68  Norvasc 10 mg, Losartan-HCTZ 100-25 mg. - Denies new side-effects.  Hyperlipidemia Lab Results  Component Value Date   CHOL 226 (H) 08/09/2017   HDL 43 08/09/2017   LDLCALC 156 (H) 08/09/2017   TRIG 137 08/09/2017   CHOLHDL 5.3 (H) 08/09/2017   Lab Results  Component Value Date   ALT 15 08/09/2017   AST 14 08/09/2017   ALKPHOS 72 08/09/2017   BILITOT 0.7 08/09/2017  Lipids were elevated off meds but had stopped statin due to myalgias. Planned for diet and exercise approach with rpt testing today. CPK 210, decreased to 134 off statin. - Denies LE pain at this time. States he has been exercising 3 days/wk.  Wt Readings from Last 3 Encounters:  02/28/18 252 lb 12.8 oz (114.7 kg)  11/29/17 254 lb 3.2 oz (115.3 kg)  08/23/17 254 lb 6.4 oz (115.4 kg)   L Shoulder Pain Cervical DVD, bilat shoulder pain when discussed in March. Referred to PT, treated with Flexeril. - Pt states that he went to PT 4 times but stopped because he didn't feel like PT was improving symptoms. He reports L shoulder pain that occasionally radiates into the L arm, last noticed radiating pain 2 wks ago. Pt works as a Glass blower/designer. States flexeril improved pain but he ran out a few months ago. Denies HA, neck pain,  weakness.  Patient Active Problem List   Diagnosis Date Noted  . Chest pain 01/20/2016  . Acute URI 01/20/2016  . Hypokalemia 01/20/2016  . Right bundle branch block 03/11/2010  . LUMBAGO 03/09/2010  . DYSURIA 03/09/2010  . NEPHROLITHIASIS, HX OF 12/10/2009  . Essential hypertension 05/14/2009  . PARASOMNIA 05/14/2009  . Hyperlipidemia 05/13/2009  . G E R D 05/13/2009   Past Medical History:  Diagnosis Date  . DEPRESSION, MILD   . Dysuria   . G E R D   . Hypercholesterolemia   . Hyperlipidemia    NOT TAKING MEDICATION  . Hypertension   . Lumbago   . NEPHROLITHIASIS, HX OF   . PARASOMNIA   . Right bundle branch block    Past Surgical History:  Procedure Laterality Date  . CARDIAC CATHETERIZATION N/A 01/19/2016   Procedure: Left Heart Cath and Coronary Angiography;  Surgeon: Donald Mocha, MD;  Location: [facility];  Service: Cardiovascular;  Laterality: N/A;  . HAND SURGERY    . right and left hand     Allergies  Allergen Reactions  . Ace Inhibitors Cough    Lisinopril caused cough   Prior to Admission medications   Medication Sig Start Date End Date Taking? Authorizing Provider  amLODipine (NORVASC) 10 MG tablet Take 1 tablet (10 mg total) by mouth daily. 11/29/17   [name]  R, MD  aspirin 81 MG tablet Take 1 tablet (81 mg total) by mouth daily. 10/29/15   Jaynee Eagles, PA-C  losartan-hydrochlorothiazide (HYZAAR) 100-25 MG tablet Take 1 tablet by mouth daily. 11/29/17   Donald Agreste, MD   Social History   Socioeconomic History  . Marital status: Married    Spouse name: Not on file  . Number of children: 2  . Years of education: 52  . Highest education level: Not on file  Occupational History  . Occupation: IT sales professional: [employer]  Social Needs  . Financial resource strain: Not on file  . Food insecurity:    Worry: Not on file    Inability: Not on file  . Transportation needs:    Medical: Not on file     Non-medical: Not on file  Tobacco Use  . Smoking status: Never Smoker  . Smokeless tobacco: Never Used  Substance and Sexual Activity  . Alcohol use: No    Alcohol/week: 0.0 standard drinks  . Drug use: No  . Sexual activity: Not on file  Lifestyle  . Physical activity:    Days per week: Not on file    Minutes per session: Not on file  . Stress: Not on file  Relationships  . Social connections:    Talks on phone: Not on file    Gets together: Not on file    Attends religious service: Not on file    Active member of club or organization: Not on file    Attends meetings of clubs or organizations: Not on file    Relationship status: Not on file  . Intimate partner violence:    Fear of current or ex partner: Not on file    Emotionally abused: Not on file    Physically abused: Not on file    Forced sexual activity: Not on file  Other Topics Concern  . Not on file  Social History Narrative   ** Merged History Encounter **       Review of Systems  Constitutional: Negative for fatigue and unexpected weight change.  Eyes: Negative for visual disturbance.  Respiratory: Negative for cough, chest tightness and shortness of breath.   Cardiovascular: Negative for chest pain, palpitations and leg swelling.  Gastrointestinal: Negative for abdominal pain and blood in stool.  Musculoskeletal: Positive for arthralgias. Negative for neck pain.  Neurological: Negative for dizziness, weakness, light-headedness and headaches.      Objective:   Physical Exam  Constitutional: He is oriented to person, place, and time. He appears well-developed and well-nourished.  HENT:  Head: Normocephalic and atraumatic.  Eyes: Pupils are equal, round, and reactive to light. EOM are normal.  Neck: No JVD present. Carotid bruit is not present.  Cardiovascular: Normal rate, regular rhythm and normal heart sounds.  No murmur heard. Pulmonary/Chest: Effort normal and breath sounds normal. He has no rales.   Musculoskeletal: He exhibits no edema.  C-spine: No midline bony tenderness. Pain free neck ROM. L shoulder: slight tenderness with slight spasms over L trapezius. Complains of discomfort over biceps. FROM of shoulder. Full RTC strength. Full grip strength.  Neurological: He is alert and oriented to person, place, and time.  Reflex Scores:      Tricep reflexes are 1+ on the right side and 2+ on the left side.      Bicep reflexes are 2+ on the right side and 1+ on the left side.      Brachioradialis reflexes  are 2+ on the right side and 2+ on the left side. Skin: Skin is warm and dry.  Psychiatric: He has a normal mood and affect.  Vitals reviewed.   Vitals:   02/28/18 0810  BP: 133/82  Pulse: 67  Temp: 97.9 F (36.6 C)  TempSrc: Oral  SpO2: 98%  Weight: 252 lb 12.8 oz (114.7 kg)  Height: 6' (1.829 m)      Assessment & Plan:   Christorpher Hisaw is a 47 y.o. male Hyperlipidemia, unspecified hyperlipidemia type - Plan: Lipid panel, Comprehensive metabolic panel  -Minimally changed.  We will recheck levels.  Intolerant to previous statin due to myalgias.  Check baseline CPK, consider low-dose statin, different from prior if significant elevated readings  Essential hypertension - Plan: amLODipine (NORVASC) 10 MG tablet, losartan-hydrochlorothiazide (HYZAAR) 100-25 MG tablet  -Stable, no med changes, labs pending  Myalgia - Plan: CK, cyclobenzaprine (FLEXERIL) 5 MG tablet, Ambulatory referral to Orthopedic Surgery Left upper arm pain - Plan: CK, Ambulatory referral to Orthopedic Surgery Trapezius muscle spasm - Plan: CK, cyclobenzaprine (FLEXERIL) 5 MG tablet, Ambulatory referral to Orthopedic Surgery  -Thought to have cervical source previously, did not report significant change in symptoms from physical therapy.  Possible overuse or type of activity at work may be contributing.  Will refer to orthopedics to decide next step, Flexeril if needed temporarily with potential side effects  and risks discussed.  RTC precautions if worse  Meds ordered this encounter  Medications  . amLODipine (NORVASC) 10 MG tablet    Sig: Take 1 tablet (10 mg total) by mouth daily.    Dispense:  90 tablet    Refill:  1  . losartan-hydrochlorothiazide (HYZAAR) 100-25 MG tablet    Sig: Take 1 tablet by mouth daily.    Dispense:  90 tablet    Refill:  1  . cyclobenzaprine (FLEXERIL) 5 MG tablet    Sig: 1 pill by mouth up to every 8 hours as needed. Start with one pill by mouth each bedtime as needed due to sedation    Dispense:  15 tablet    Refill:  0   Patient Instructions     No change in blood pressure medicine for now.  I will recheck the cholesterol levels but continue to work on exercise and watch diet.  Ideally exercise most days per week.  If cholesterol still elevated, can look at different statin medication as an option.   I did refill the muscle relaxant if needed for upper shoulder or arm pain.  Use that only if needed, and do not operate machinery or drive while taking that medication as it does cause sedation.  I will refer you to orthopedics as physical therapy did not provide benefit.Return to the clinic or go to the nearest emergency room if any of your symptoms worsen or new symptoms occur.  I also rechecked the muscle enzyme test, but that was normal at last visit.   Follow up in 6 months for a physical   If you have lab work done today you will be contacted with your lab results within the next 2 weeks.  If you have not heard from [place] then please contact us. The fastest way to get your results is to register for My Chart.   IF you received an x-ray today, you will receive an invoice from [facility]. Please contact [facility] at [phone] with questions or concerns regarding your invoice.   IF you received labwork today, you will receive  an Pharmacologist from [facility]. Please contact [facility] at [phone] with questions or concerns regarding your  invoice.   Our billing staff will not be able to assist you with questions regarding bills from these companies.  You will be contacted with the lab results as soon as they are available. The fastest way to get your results is to activate your My Chart account. Instructions are located on the last page of this paperwork. If you have not heard from [place] regarding the results in 2 weeks, please contact this office.       I personally performed the services described in this documentation, which was scribed in my presence. The recorded information has been reviewed and considered for accuracy and completeness, addended by me as needed, and agree with information above.  Signed,   [name], MD [facility] at [facility].  02/28/18 8:42 AM

## 2018-02-28 NOTE — Patient Instructions (Addendum)
   No change in blood pressure medicine for now.  I will recheck the cholesterol levels but continue to work on exercise and watch diet.  Ideally exercise most days per week.  If cholesterol still elevated, can look at different statin medication as an option.   I did refill the muscle relaxant if needed for upper shoulder or arm pain.  Use that only if needed, and do not operate machinery or drive while taking that medication as it does cause sedation.  I will refer you to orthopedics as physical therapy did not provide benefit.Return to the clinic or go to the nearest emergency room if any of your symptoms worsen or new symptoms occur.  I also rechecked the muscle enzyme test, but that was normal at last visit.   Follow up in 6 months for a physical   If you have lab work done today you will be contacted with your lab results within the next 2 weeks.  If you have not heard from [place] then please contact us. The fastest way to get your results is to register for My Chart.   IF you received an x-ray today, you will receive an invoice from [facility]. Please contact [facility] at [phone] with questions or concerns regarding your invoice.   IF you received labwork today, you will receive an invoice from [facility]. Please contact [facility] at [phone] with questions or concerns regarding your invoice.   Our billing staff will not be able to assist you with questions regarding bills from these companies.  You will be contacted with the lab results as soon as they are available. The fastest way to get your results is to activate your My Chart account. Instructions are located on the last page of this paperwork. If you have not heard from [place] regarding the results in 2 weeks, please contact this office.

## 2018-03-01 LAB — COMPREHENSIVE METABOLIC PANEL
A/G RATIO: 1.6 (ref 1.2–2.2)
ALBUMIN: 4.5 g/dL (ref 3.5–5.5)
ALT: 16 IU/L (ref 0–44)
AST: 16 IU/L (ref 0–40)
Alkaline Phosphatase: 57 IU/L (ref 39–117)
BILIRUBIN TOTAL: 0.5 mg/dL (ref 0.0–1.2)
BUN / CREAT RATIO: 23 — AB (ref 9–20)
BUN: 21 mg/dL (ref 6–24)
CHLORIDE: 102 mmol/L (ref 96–106)
CO2: 22 mmol/L (ref 20–29)
Calcium: 9.3 mg/dL (ref 8.7–10.2)
Creatinine, Ser: 0.93 mg/dL (ref 0.76–1.27)
GFR calc Af Amer: 111 mL/min/{1.73_m2} (ref >59)
GFR calc non Af Amer: 96 mL/min/{1.73_m2} (ref >59)
GLOBULIN, TOTAL: 2.8 g/dL (ref 1.5–4.5)
Glucose: 84 mg/dL (ref 65–99)
POTASSIUM: 3.9 mmol/L (ref 3.5–5.2)
SODIUM: 142 mmol/L (ref 134–144)
TOTAL PROTEIN: 7.3 g/dL (ref 6.0–8.5)

## 2018-03-01 LAB — LIPID PANEL
Chol/HDL Ratio: 4.6 ratio (ref 0.0–5.0)
Cholesterol, Total: 204 mg/dL — ABNORMAL HIGH (ref 100–199)
HDL: 44 mg/dL (ref >39)
LDL Calculated: 144 mg/dL — ABNORMAL HIGH (ref 0–99)
TRIGLYCERIDES: 80 mg/dL (ref 0–149)
VLDL Cholesterol Cal: 16 mg/dL (ref 5–40)

## 2018-03-01 LAB — CK: CK TOTAL: 169 U/L (ref 24–204)

## 2018-04-19 ENCOUNTER — Ambulatory Visit: Payer: 59 | Admitting: Emergency Medicine

## 2018-04-19 ENCOUNTER — Encounter: Payer: Self-pay | Admitting: Emergency Medicine

## 2018-04-19 ENCOUNTER — Other Ambulatory Visit: Payer: Self-pay

## 2018-04-19 VITALS — BP 134/72 | HR 77 | Temp 98.1°F | Resp 16 | Ht 72.0 in | Wt 248.2 lb

## 2018-04-19 DIAGNOSIS — Z87442 Personal history of urinary calculi: Secondary | ICD-10-CM | POA: Diagnosis not present

## 2018-04-19 DIAGNOSIS — M545 Low back pain, unspecified: Secondary | ICD-10-CM

## 2018-04-19 DIAGNOSIS — R82998 Other abnormal findings in urine: Secondary | ICD-10-CM | POA: Diagnosis not present

## 2018-04-19 DIAGNOSIS — R399 Unspecified symptoms and signs involving the genitourinary system: Secondary | ICD-10-CM

## 2018-04-19 LAB — POCT CBC
Granulocyte percent: 44.7 %G (ref 37–80)
HCT, POC: 41.4 % — AB (ref 29–41)
HEMOGLOBIN: 13.8 g/dL — AB (ref 9.5–13.5)
Lymph, poc: 3.6 — AB (ref 0.6–3.4)
MCH: 27.7 pg (ref 27–31.2)
MCHC: 33.4 g/dL (ref 31.8–35.4)
MCV: 83.1 fL (ref 76–111)
MID (CBC): 0.8 (ref 0–0.9)
MPV: 7.4 fL (ref 0–99.8)
PLATELET COUNT, POC: 353 10*3/uL (ref 142–424)
POC Granulocyte: 3.5 (ref 2–6.9)
POC LYMPH PERCENT: 45.5 %L (ref 10–50)
POC MID %: 9.8 %M (ref 0–12)
RBC: 4.98 M/uL (ref 4.69–6.13)
RDW, POC: 14.4 %
WBC: 7.9 10*3/uL (ref 4.6–10.2)

## 2018-04-19 LAB — POCT URINALYSIS DIP (MANUAL ENTRY)
BILIRUBIN UA: NEGATIVE
Blood, UA: NEGATIVE
GLUCOSE UA: NEGATIVE mg/dL
LEUKOCYTES UA: NEGATIVE
Nitrite, UA: NEGATIVE
Protein Ur, POC: 30 mg/dL — AB
Spec Grav, UA: 1.02 (ref 1.010–1.025)
Urobilinogen, UA: 0.2 E.U./dL
pH, UA: 7 (ref 5.0–8.0)

## 2018-04-19 MED ORDER — CIPROFLOXACIN HCL 500 MG PO TABS: 500.0000 mg | ORAL_TABLET | Freq: Two times a day (BID) | ORAL | 0 refills | Status: DC

## 2018-04-19 NOTE — Progress Notes (Signed)
Donald [name] 47 y.o.   Chief Complaint  Patient presents with  . Back Pain    per patient on the sides  x 1 month  . urinary problems    per patient urine has a lot of foam x 2 weeks    HISTORY OF PRESENT ILLNESS: This is a 47 y.o. male complaining of low back pain for 1 month.  Has a history of kidney stones.  Also complaining of foamy urine along with some lower urinary tract symptoms.  Denies fever or chills.  Eating and drinking well denies nausea or vomiting.  Denies any other significant symptoms.  No history of diabetes.  HPI   Prior to Admission medications   Medication Sig Start Date End Date Taking? Authorizing Provider  amLODipine (NORVASC) 10 MG tablet Take 1 tablet (10 mg total) by mouth daily. 02/28/18  Yes [name], MD  aspirin 81 MG tablet Take 1 tablet (81 mg total) by mouth daily. 10/29/15  Yes Jaynee Eagles, PA-C  losartan-hydrochlorothiazide (HYZAAR) 100-25 MG tablet Take 1 tablet by mouth daily. 02/28/18  Yes [name], MD  cyclobenzaprine (FLEXERIL) 5 MG tablet 1 pill by mouth up to every 8 hours as needed. Start with one pill by mouth each bedtime as needed due to sedation Patient not taking: Reported on 04/19/2018 02/28/18   [name], MD    Allergies  Allergen Reactions  . Ace Inhibitors Cough    Lisinopril caused cough    Patient Active Problem List   Diagnosis Date Noted  . Chest pain 01/20/2016  . Acute URI 01/20/2016  . Hypokalemia 01/20/2016  . Right bundle branch block 03/11/2010  . LUMBAGO 03/09/2010  . DYSURIA 03/09/2010  . NEPHROLITHIASIS, HX OF 12/10/2009  . Essential hypertension 05/14/2009  . PARASOMNIA 05/14/2009  . Hyperlipidemia 05/13/2009  . G E R D 05/13/2009    Past Medical History:  Diagnosis Date  . DEPRESSION, MILD   . Dysuria   . G E R D   . Hypercholesterolemia   . Hyperlipidemia    NOT TAKING MEDICATION  . Hypertension   . Lumbago   . NEPHROLITHIASIS, HX OF   . PARASOMNIA   . Right  bundle branch block     Past Surgical History:  Procedure Laterality Date  . CARDIAC CATHETERIZATION N/A 01/19/2016   Procedure: Left Heart Cath and Coronary Angiography;  Surgeon: Sherren Mocha, MD;  Location: [facility];  Service: Cardiovascular;  Laterality: N/A;  . HAND SURGERY    . right and left hand      Social History   Socioeconomic History  . Marital status: Married    Spouse name: Not on file  . Number of children: 2  . Years of education: 42  . Highest education level: Not on file  Occupational History  . Occupation: IT sales professional: [employer]  Social Needs  . Financial resource strain: Not on file  . Food insecurity:    Worry: Not on file    Inability: Not on file  . Transportation needs:    Medical: Not on file    Non-medical: Not on file  Tobacco Use  . Smoking status: Never Smoker  . Smokeless tobacco: Never Used  Substance and Sexual Activity  . Alcohol use: No    Alcohol/week: 0.0 standard drinks  . Drug use: No  . Sexual activity: Not on file  Lifestyle  . Physical activity:    Days per week:  Not on file    Minutes per session: Not on file  . Stress: Not on file  Relationships  . Social connections:    Talks on phone: Not on file    Gets together: Not on file    Attends religious service: Not on file    Active member of club or organization: Not on file    Attends meetings of clubs or organizations: Not on file    Relationship status: Not on file  . Intimate partner violence:    Fear of current or ex partner: Not on file    Emotionally abused: Not on file    Physically abused: Not on file    Forced sexual activity: Not on file  Other Topics Concern  . Not on file  Social History Narrative   ** Merged History Encounter **        Family History  Problem Relation Age of Onset  . Hypertension Mother   . Hypertension Sister      Review of Systems  Constitutional: Negative.  Negative for chills and fever.    HENT: Negative.   Eyes: Negative.   Respiratory: Negative.  Negative for cough and shortness of breath.   Cardiovascular: Negative.  Negative for chest pain and palpitations.  Gastrointestinal: Negative.  Negative for diarrhea, nausea and vomiting.  Genitourinary: Positive for frequency and urgency. Negative for dysuria, flank pain and hematuria.  Musculoskeletal: Positive for back pain.  Skin: Negative.  Negative for rash.  Neurological: Negative.  Negative for dizziness and headaches.  Endo/Heme/Allergies: Negative.   All other systems reviewed and are negative.   Vitals:   04/19/18 1618  BP: 134/72  Pulse: 77  Resp: 16  Temp: 98.1 F (36.7 C)  SpO2: 98%    Physical Exam  Constitutional: He is oriented to person, place, and time. He appears well-developed and well-nourished.  HENT:  Head: Normocephalic and atraumatic.  Mouth/Throat: Oropharynx is clear and moist.  Eyes: Pupils are equal, round, and reactive to light. EOM are normal.  Neck: Normal range of motion. Neck supple.  Cardiovascular: Normal rate, regular rhythm and normal heart sounds.  Pulmonary/Chest: Effort normal and breath sounds normal.  Abdominal: Soft. Bowel sounds are normal. He exhibits no distension. There is no tenderness.  Musculoskeletal:       Lumbar back: He exhibits decreased range of motion and tenderness. He exhibits no bony tenderness, no spasm and normal pulse.  Neurological: He is alert and oriented to person, place, and time. No sensory deficit. He exhibits normal muscle tone.  Skin: Skin is warm and dry. Capillary refill takes less than 2 seconds.  Psychiatric: He has a normal mood and affect. His behavior is normal.  Vitals reviewed.  Results for orders placed or performed in visit on 04/19/18 (from the past 24 hour(s))  POCT urinalysis dipstick     Status: Abnormal   Collection Time: 04/19/18  4:30 PM  Result Value Ref Range   Color, UA yellow yellow   Clarity, UA clear clear    Glucose, UA negative negative mg/dL   Bilirubin, UA negative negative   Ketones, POC UA small (15) (A) negative mg/dL   Spec Grav, UA 1.020 1.010 - 1.025   Blood, UA negative negative   pH, UA 7.0 5.0 - 8.0   Protein Ur, POC =30 (A) negative mg/dL   Urobilinogen, UA 0.2 0.2 or 1.0 E.U./dL   Nitrite, UA Negative Negative   Leukocytes, UA Negative Negative  POCT CBC  Status: Abnormal   Collection Time: 04/19/18  4:40 PM  Result Value Ref Range   WBC 7.9 4.6 - 10.2 K/uL   Lymph, poc 3.6 (A) 0.6 - 3.4   POC LYMPH PERCENT 45.5 10 - 50 %L   MID (cbc) 0.8 0 - 0.9   POC MID % 9.8 0 - 12 %M   POC Granulocyte 3.5 2 - 6.9   Granulocyte percent 44.7 37 - 80 %G   RBC 4.98 4.69 - 6.13 M/uL   Hemoglobin 13.8 (A) 9.5 - 13.5 g/dL   HCT, POC 41.4 (A) 29 - 41 %   MCV 83.1 76 - 111 fL   MCH, POC 27.7 27 - 31.2 pg   MCHC 33.4 31.8 - 35.4 g/dL   RDW, POC 14.4 %   Platelet Count, POC 353 142 - 424 K/uL   MPV 7.4 0 - 99.8 fL   A total of 25 minutes was spent in the room with the patient, greater than 50% of which was in counseling/coordination of care regarding differential diagnosis, treatment, medications, and need for follow-up if no better or worse.   ASSESSMENT & PLAN: Jermaine was seen today for back pain and urinary problems.  Diagnoses and all orders for this visit:  Lower urinary tract symptoms (LUTS) -     Urine Culture -     POCT urinalysis dipstick -     POCT CBC -     Comprehensive metabolic panel -     ciprofloxacin (CIPRO) 500 MG tablet; Take 1 tablet (500 mg total) by mouth 2 (two) times daily.  Foamy urine -     ciprofloxacin (CIPRO) 500 MG tablet; Take 1 tablet (500 mg total) by mouth 2 (two) times daily.  Acute bilateral low back pain without sciatica  History of kidney stones    Patient Instructions  Back Pain, Adult Back pain is very common. The pain often gets better over time. The cause of back pain is usually not dangerous. Most people can learn to manage  their back pain on their own. Follow these instructions at home: Watch your back pain for any changes. The following actions may help to lessen any pain you are feeling:  Stay active. Start with short walks on flat ground if you can. Try to walk farther each day.  Exercise regularly as told by your doctor. Exercise helps your back heal faster. It also helps avoid future injury by keeping your muscles strong and flexible.  Do not sit, drive, or stand in one place for more than 30 minutes.  Do not stay in bed. Resting more than 1-2 days can slow down your recovery.  Be careful when you bend or lift an object. Use good form when lifting: ? Bend at your knees. ? Keep the object close to your body. ? Do not twist.  Sleep on a firm mattress. Lie on your side, and bend your knees. If you lie on your back, put a pillow under your knees.  Take medicines only as told by your doctor.  Put ice on the injured area. ? Put ice in a plastic bag. ? Place a towel between your skin and the bag. ? Leave the ice on for 20 minutes, 2-3 times a day for the first 2-3 days. After that, you can switch between ice and heat packs.  Avoid feeling anxious or stressed. Find good ways to deal with stress, such as exercise.  Maintain a healthy weight. Extra weight puts stress on your  back.  Contact a doctor if:  You have pain that does not go away with rest or medicine.  You have worsening pain that goes down into your legs or buttocks.  You have pain that does not get better in one week.  You have pain at night.  You lose weight.  You have a fever or chills. Get help right away if:  You cannot control when you poop (bowel movement) or pee (urinate).  Your arms or legs feel weak.  Your arms or legs lose feeling (numbness).  You feel sick to your stomach (nauseous) or throw up (vomit).  You have belly (abdominal) pain.  You feel like you may pass out (faint). This information is not intended to  replace advice given to you by your health care provider. Make sure you discuss any questions you have with your health care provider. Document Released: 11/08/2007 Document Revised: 10/28/2015 Document Reviewed: 09/23/2013 Elsevier Interactive Patient Education  2018 Elsevier Inc.      Agustina Caroli, MD [facility]

## 2018-04-19 NOTE — Patient Instructions (Signed)

## 2018-04-20 LAB — COMPREHENSIVE METABOLIC PANEL
A/G RATIO: 1.7 (ref 1.2–2.2)
ALBUMIN: 4.7 g/dL (ref 3.5–5.5)
ALK PHOS: 61 IU/L (ref 39–117)
ALT: 14 IU/L (ref 0–44)
AST: 15 IU/L (ref 0–40)
BILIRUBIN TOTAL: 0.3 mg/dL (ref 0.0–1.2)
BUN / CREAT RATIO: 17 (ref 9–20)
BUN: 20 mg/dL (ref 6–24)
CHLORIDE: 103 mmol/L (ref 96–106)
CO2: 22 mmol/L (ref 20–29)
Calcium: 9.6 mg/dL (ref 8.7–10.2)
Creatinine, Ser: 1.18 mg/dL (ref 0.76–1.27)
GFR calc non Af Amer: 72 mL/min/{1.73_m2} (ref >59)
GFR, EST AFRICAN AMERICAN: 83 mL/min/{1.73_m2} (ref >59)
GLUCOSE: 92 mg/dL (ref 65–99)
Globulin, Total: 2.8 g/dL (ref 1.5–4.5)
POTASSIUM: 4 mmol/L (ref 3.5–5.2)
Sodium: 140 mmol/L (ref 134–144)
TOTAL PROTEIN: 7.5 g/dL (ref 6.0–8.5)

## 2018-04-20 LAB — URINE CULTURE: Organism ID, Bacteria: NO GROWTH

## 2018-04-22 ENCOUNTER — Encounter: Payer: Self-pay | Admitting: *Deleted

## 2018-07-08 ENCOUNTER — Ambulatory Visit (HOSPITAL_COMMUNITY)
Admission: EM | Admit: 2018-07-08 | Discharge: 2018-07-08 | Disposition: A | Discharge status: Home / Self Care | Payer: 59 | Attending: Family Medicine | Admitting: Family Medicine

## 2018-07-08 ENCOUNTER — Other Ambulatory Visit: Payer: Self-pay

## 2018-07-08 ENCOUNTER — Encounter (HOSPITAL_COMMUNITY): Payer: Self-pay | Admitting: Emergency Medicine

## 2018-07-08 DIAGNOSIS — R509 Fever, unspecified: Secondary | ICD-10-CM

## 2018-07-08 DIAGNOSIS — J029 Acute pharyngitis, unspecified: Secondary | ICD-10-CM | POA: Diagnosis not present

## 2018-07-08 LAB — POCT RAPID STREP A: Streptococcus, Group A Screen (Direct): NEGATIVE

## 2018-07-08 MED ORDER — ACETAMINOPHEN 325 MG PO TABS
650.0000 mg | ORAL_TABLET | Freq: Once | ORAL | Status: AC
  Administered 2018-07-08: 650 mg via ORAL

## 2018-07-08 MED ORDER — AMOXICILLIN 875 MG PO TABS: 875.0000 mg | ORAL_TABLET | Freq: Two times a day (BID) | ORAL | 0 refills | Status: AC

## 2018-07-08 MED ORDER — ACETAMINOPHEN 325 MG PO TABS
ORAL_TABLET | ORAL | Status: AC
  Filled 2018-07-08: qty 2

## 2018-07-08 NOTE — ED Triage Notes (Signed)
Pt reports fever, pain on both sides of his neck and into his ears, headache and body aches for over 24 hrs.  He reports the fever being as high as 104.5. he has been taking Tylenol and Ibuprofen.

## 2018-07-08 NOTE — Discharge Instructions (Addendum)
You may use over the counter ibuprofen or acetaminophen as needed.  °For a sore throat, over the counter products such as [employer] Peroxyl Mouth Sore Rinse or Chloraseptic Sore Throat Spray may provide some temporary relief. ° ° ° ° °

## 2018-07-08 NOTE — ED Provider Notes (Signed)
[name]   [id number] 07/08/18 Arrival Time: 1203  ASSESSMENT & PLAN:  1. Sore throat   2. Fever and chills    Given abrupt onset and exam will empirically treat for strep. No sign of peritonsillar abscess.  Meds ordered this encounter  Medications  . acetaminophen (TYLENOL) tablet 650 mg  . amoxicillin (AMOXIL) 875 MG tablet    Sig: Take 1 tablet (875 mg total) by mouth 2 (two) times daily for 10 days.    Dispense:  20 tablet    Refill:  0   Results for orders placed or performed during the hospital encounter of 07/08/18  POCT rapid strep A [facility])  Result Value Ref Range   Streptococcus, Group A Screen (Direct) NEGATIVE NEGATIVE   Labs Reviewed  POCT RAPID STREP A   OTC analgesics and throat care as needed  Instructed to finish full 10 day course of antibiotics. Will follow up if not showing significant improvement over the next 48 hours.    Discharge Instructions      You may use over the counter ibuprofen or acetaminophen as needed.   For a sore throat, over the counter products such as [employer] Peroxyl Mouth Sore Rinse or Chloraseptic Sore Throat Spray may provide some temporary relief.     Reviewed expectations re: course of current medical issues. Questions answered. Outlined signs and symptoms indicating need for more acute intervention. Patient verbalized understanding. After Visit Summary given.   SUBJECTIVE:  Donald [name] is a 48 y.o. male who reports a sore throat. Describes as "intense pain" with swallowing. Onset abrupt beginning 24 hours ago. No respiratory symptoms. Normal PO intake but reports discomfort with swallowing. Fever reported: yes, up to 104 degrees at home with chills. No neck pain or swelling. No associated n/v/abdominal symptoms. Sick contacts: none known.  OTC treatment: ibuprofen with mild help.  ROS: As per HPI.   OBJECTIVE:  Vitals:   07/08/18 1313  BP: 119/75  Pulse: 94  Temp: (!) 102.3 F  (39.1 C)  TempSrc: Oral  SpO2: 99%    Fever noted.  General appearance: alert; no distress but appears fatigued HEENT: throat with tonsillar hypertrophy, marked erythema and exudates present; uvula midline: yes Neck: supple with FROM; bilateral small cervical LAD that is tender CV: RRR Lungs: clear to auscultation bilaterally Abd: soft; non-tender Skin: reveals no rash; warm and dry Psychological: alert and cooperative; normal mood and affect  Allergies  Allergen Reactions  . Ace Inhibitors Cough    Lisinopril caused cough    Past Medical History:  Diagnosis Date  . DEPRESSION, MILD   . Dysuria   . G E R D   . Hypercholesterolemia   . Hyperlipidemia    NOT TAKING MEDICATION  . Hypertension   . Lumbago   . NEPHROLITHIASIS, HX OF   . PARASOMNIA   . Right bundle branch block    Social History   Socioeconomic History  . Marital status: Married    Spouse name: Not on file  . Number of children: 2  . Years of education: 69  . Highest education level: Not on file  Occupational History  . Occupation: IT sales professional: [employer]  Social Needs  . Financial resource strain: Not on file  . Food insecurity:    Worry: Not on file    Inability: Not on file  . Transportation needs:    Medical: Not on file    Non-medical: Not on file  Tobacco Use  . Smoking status: Never Smoker  . Smokeless tobacco: Never Used  Substance and Sexual Activity  . Alcohol use: No    Alcohol/week: 0.0 standard drinks  . Drug use: No  . Sexual activity: Not on file  Lifestyle  . Physical activity:    Days per week: Not on file    Minutes per session: Not on file  . Stress: Not on file  Relationships  . Social connections:    Talks on phone: Not on file    Gets together: Not on file    Attends religious service: Not on file    Active member of club or organization: Not on file    Attends meetings of clubs or organizations: Not on file    Relationship status: Not  on file  . Intimate partner violence:    Fear of current or ex partner: Not on file    Emotionally abused: Not on file    Physically abused: Not on file    Forced sexual activity: Not on file  Other Topics Concern  . Not on file  Social History Narrative   ** Merged History Encounter **       Family History  Problem Relation Age of Onset  . Hypertension Mother   . Hypertension Sister           Vanessa Kick, MD 07/08/18 [phone]

## 2018-09-09 ENCOUNTER — Telehealth: Payer: Self-pay | Admitting: Emergency Medicine

## 2018-09-09 NOTE — Telephone Encounter (Signed)
Patient called and stated he woke up this morning and his heart was racing really fast ans his left leg went numb. Patient stated he has a knot on the back of left leg as well. Patient was informed Dr Carlota Raspberry is off all week and was informed to go to the urgent care or to the hospital for evaluation. (Posible blood clot or heart related issus)

## 2018-09-23 ENCOUNTER — Other Ambulatory Visit: Payer: Self-pay

## 2018-09-23 ENCOUNTER — Encounter (HOSPITAL_COMMUNITY): Payer: Self-pay

## 2018-09-23 ENCOUNTER — Emergency Department (HOSPITAL_COMMUNITY)
Admission: EM | Admit: 2018-09-23 | Discharge: 2018-09-24 | Disposition: A | Discharge status: Home / Self Care | Payer: 59 | Attending: Emergency Medicine | Admitting: Emergency Medicine

## 2018-09-23 DIAGNOSIS — R0789 Other chest pain: Secondary | ICD-10-CM | POA: Diagnosis not present

## 2018-09-23 DIAGNOSIS — Z79899 Other long term (current) drug therapy: Secondary | ICD-10-CM | POA: Diagnosis not present

## 2018-09-23 DIAGNOSIS — R509 Fever, unspecified: Secondary | ICD-10-CM

## 2018-09-23 DIAGNOSIS — Z7982 Long term (current) use of aspirin: Secondary | ICD-10-CM | POA: Insufficient documentation

## 2018-09-23 DIAGNOSIS — I10 Essential (primary) hypertension: Secondary | ICD-10-CM | POA: Diagnosis not present

## 2018-09-23 DIAGNOSIS — R079 Chest pain, unspecified: Secondary | ICD-10-CM

## 2018-09-23 DIAGNOSIS — R0602 Shortness of breath: Secondary | ICD-10-CM | POA: Insufficient documentation

## 2018-09-23 LAB — CBC
HCT: 43.5 % (ref 39.0–52.0)
Hemoglobin: 14.3 g/dL (ref 13.0–17.0)
MCH: 27.2 pg (ref 26.0–34.0)
MCHC: 32.9 g/dL (ref 30.0–36.0)
MCV: 82.9 fL (ref 80.0–100.0)
Platelets: 341 10*3/uL (ref 150–400)
RBC: 5.25 MIL/uL (ref 4.22–5.81)
RDW: 14.6 % (ref 11.5–15.5)
WBC: 8 10*3/uL (ref 4.0–10.5)
nRBC: 0 % (ref 0.0–0.2)

## 2018-09-23 LAB — BASIC METABOLIC PANEL
Anion gap: 15 (ref 5–15)
BUN: 13 mg/dL (ref 6–20)
CO2: 17 mmol/L — ABNORMAL LOW (ref 22–32)
Calcium: 9.3 mg/dL (ref 8.9–10.3)
Chloride: 104 mmol/L (ref 98–111)
Creatinine, Ser: 1.01 mg/dL (ref 0.61–1.24)
GFR calc Af Amer: >60 mL/min (ref >60)
GFR calc non Af Amer: >60 mL/min (ref >60)
Glucose, Bld: 177 mg/dL — ABNORMAL HIGH (ref 70–99)
Potassium: 3.3 mmol/L — ABNORMAL LOW (ref 3.5–5.1)
Sodium: 136 mmol/L (ref 135–145)

## 2018-09-23 LAB — TROPONIN I: Troponin I: <0.03 ng/mL (ref <0.03)

## 2018-09-23 MED ORDER — SODIUM CHLORIDE 0.9% FLUSH: 3.0000 mL | Freq: Once | INTRAVENOUS | Status: DC

## 2018-09-23 NOTE — ED Triage Notes (Signed)
Pt states he started having right sided chest pain x 2 days ago; Pt states he also has slight SOB; Pt states pain at 3/10 but difficultly taking a deep breath; pt a&ox 4 on arrival. 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

## 2018-09-24 ENCOUNTER — Emergency Department (HOSPITAL_COMMUNITY): Payer: 59

## 2018-09-24 LAB — D-DIMER, QUANTITATIVE: D-Dimer, Quant: <0.27 ug/mL-FEU (ref 0.00–0.50)

## 2018-09-24 NOTE — ED Notes (Addendum)
Pt discharged from ED; instructions provided; Pt encouraged to return to ED if symptoms worsen and to f/u with PCP; Pt verbalized understanding of all instructions 

## 2018-09-24 NOTE — ED Provider Notes (Signed)
TIME SEEN: 12:48 AM  CHIEF COMPLAINT: Right-sided chest pain  HPI: Patient is a 48 year old male with history of hypertension, hyperlipidemia who presents to the emergency department with right-sided chest pain that started 2 to 3 days ago.  Reports he feels like he has been clearing his throat more than normal, feels like he has had wheezing in the right side of his chest.  Describes the pain as a heaviness.  It is worse with deep inspiration.  No shortness of breath, nausea, vomiting, diaphoresis or dizziness.  No lower extremity swelling or pain.  No previous history of PE or DVT.  No recent prolonged immobilization such as long flight, hospitalization, surgery.  No recent travel.  States he is concerned about coronavirus because a coworker's wife just tested positive.  He has been around this coworker.  No other sick contacts.  ROS: See HPI Constitutional: no fever  Eyes: no drainage  ENT: no runny nose   Cardiovascular:  Right sided chest pain  Resp: no SOB  GI: no vomiting GU: no dysuria Integumentary: no rash  Allergy: no hives  Musculoskeletal: no leg swelling  Neurological: no slurred speech ROS otherwise negative  PAST MEDICAL HISTORY/PAST SURGICAL HISTORY:  Past Medical History:  Diagnosis Date  . DEPRESSION, MILD   . Dysuria   . G E R D   . Hypercholesterolemia   . Hyperlipidemia    NOT TAKING MEDICATION  . Hypertension   . Lumbago   . NEPHROLITHIASIS, HX OF   . PARASOMNIA   . Right bundle branch block     MEDICATIONS:  Prior to Admission medications   Medication Sig Start Date End Date Taking? Authorizing Provider  amLODipine (NORVASC) 10 MG tablet Take 1 tablet (10 mg total) by mouth daily. 02/28/18   [name], MD  aspirin 81 MG tablet Take 1 tablet (81 mg total) by mouth daily. 10/29/15   Jaynee Eagles, PA-C  ciprofloxacin (CIPRO) 500 MG tablet Take 1 tablet (500 mg total) by mouth 2 (two) times daily. 04/19/18   Horald Pollen, MD  cyclobenzaprine  (FLEXERIL) 5 MG tablet 1 pill by mouth up to every 8 hours as needed. Start with one pill by mouth each bedtime as needed due to sedation Patient not taking: Reported on 04/19/2018 02/28/18   [name], MD  losartan-hydrochlorothiazide (HYZAAR) 100-25 MG tablet Take 1 tablet by mouth daily. 02/28/18   [name], MD    ALLERGIES:  Allergies  Allergen Reactions  . Ace Inhibitors Cough    Lisinopril caused cough    SOCIAL HISTORY:  Social History   Tobacco Use  . Smoking status: Never Smoker  . Smokeless tobacco: Never Used  Substance Use Topics  . Alcohol use: No    Alcohol/week: 0.0 standard drinks    FAMILY HISTORY: Family History  Problem Relation Age of Onset  . Hypertension Mother   . Hypertension Sister     EXAM: BP (!) 194/93   Pulse 79   Temp 98.3 F (36.8 C) (Oral)   Resp 14   Ht 6\' 1"  (1.854 m)   Wt 115.7 kg   SpO2 100%   BMI 33.64 kg/m  CONSTITUTIONAL: Alert and oriented and responds appropriately to questions. Well-appearing; well-nourished HEAD: Normocephalic EYES: Conjunctivae clear, pupils appear equal, EOMI ENT: normal nose; moist mucous membranes NECK: Supple, no meningismus, no nuchal rigidity, no LAD  CARD: RRR; S1 and S2 appreciated; no murmurs, no clicks, no rubs, no gallops RESP: Normal chest excursion  without splinting or tachypnea; breath sounds clear and equal bilaterally; no wheezes, no rhonchi, no rales, no hypoxia or respiratory distress, speaking full sentences ABD/GI: Normal bowel sounds; non-distended; soft, non-tender, no rebound, no guarding, no peritoneal signs, no hepatosplenomegaly BACK:  The back appears normal and is non-tender to palpation, there is no CVA tenderness EXT: Normal ROM in all joints; non-tender to palpation; no edema; normal capillary refill; no cyanosis, no calf tenderness or swelling    SKIN: Normal color for age and race; warm; no rash NEURO: Moves all extremities equally PSYCH: The patient's mood  and manner are appropriate. Grooming and personal hygiene are appropriate.  MEDICAL DECISION MAKING: Patient here with right-sided chest pain.  States he feels like it is hard to take a deep breath.  He is concerned about coronavirus but has not had any fevers, chills or productive cough.  His chest x-ray today is clear and he is not hypoxic with no increased work of breathing.  Afebrile here in the emergency department.  At this time, does not appear that patient would need admission to the hospital, therefore testing is not indicated.  Discussed this with patient and have recommended he treat this as if he did have COVID-19 and self isolate.  Will check d-dimer to rule out PE given his pleuritic chest pain.  Seems very atypical for ACS and he has had a negative troponin which I do not feel needs to be ruled repeated.  He does have non-anion gap metabolic acidosis.  No history of kidney disorder.  No recent diarrhea.  ED PROGRESS: Patient's d-dimer is negative.  He is hypertensive here but does appear very anxious.  Have advised him to continue his blood pressure medications at home as prescribed.  I feel he is safe for discharge with outpatient follow-up.  He is comfortable with this plan.  I have discussed the importance of self quarantine for at least 7 days after the onset of symptoms at least 3 days after being afebrile.  Discussed the importance of hand hygiene.   At this time, I do not feel there is any life-threatening condition present. I have reviewed and discussed all results (EKG, imaging, lab, urine as appropriate) and exam findings with patient/family. I have reviewed nursing notes and appropriate previous records.  I feel the patient is safe to be discharged home without further emergent workup and can continue workup as an outpatient as needed. Discussed usual and customary return precautions. Patient/family verbalize understanding and are comfortable with this plan.  Outpatient follow-up has  been provided as needed. All questions have been answered.      EKG Interpretation  Date/Time:  Monday September 23 2018 21:48:08 EDT Ventricular Rate:  83 PR Interval:  176 QRS Duration: 168 QT Interval:  432 QTC Calculation: 507 R Axis:   79 Text Interpretation:  Normal sinus rhythm Right bundle branch block Abnormal ECG No significant change since last tracing Reconfirmed by Ward, Cyril Mourning [phone]) on 09/24/2018 1:15:18 AM         Ward, Delice Bison, DO 09/24/18 0148

## 2018-09-24 NOTE — ED Notes (Signed)
Pt states that he does have HTN but did not take medications today

## 2018-09-24 NOTE — Discharge Instructions (Addendum)
Since you are concerned that you have been exposed to coronavirus, we recommend that you self isolate for the next 7 days.  If you develop a fever, you should self isolate until you have had no fever for 3 full days without taking Tylenol or ibuprofen.  Your work-up today was unremarkable including normal cardiac labs, clear chest x-ray.  No sign of heart attack or blood clot today.  You may take over-the-counter Tylenol 1000 mg every 6 hours as needed for pain.

## 2018-09-30 ENCOUNTER — Telehealth (INDEPENDENT_AMBULATORY_CARE_PROVIDER_SITE_OTHER): Payer: 59 | Admitting: Family Medicine

## 2018-09-30 ENCOUNTER — Other Ambulatory Visit: Payer: Self-pay

## 2018-09-30 DIAGNOSIS — R059 Cough, unspecified: Secondary | ICD-10-CM

## 2018-09-30 DIAGNOSIS — R05 Cough: Secondary | ICD-10-CM

## 2018-09-30 DIAGNOSIS — R0981 Nasal congestion: Secondary | ICD-10-CM

## 2018-09-30 MED ORDER — DOXYCYCLINE HYCLATE 100 MG PO TABS: 100.0000 mg | ORAL_TABLET | Freq: Two times a day (BID) | ORAL | 0 refills | Status: DC

## 2018-09-30 NOTE — Patient Instructions (Signed)
Saline nasal spray 3-4 times per day for nasal congestion, mucinex or mucinex DM for cough, and can start antibiotic as we discussed.   If any shortness of breath at rest, dizziness returns, or continuing to worsen - be seen in emergency room.  Remain at home until no fevers for 3 days (off tylenol or other fever reducing medicine) and symptoms must be improving before you can return to work.    Return to the clinic or go to the nearest emergency room if any of your symptoms worsen or new symptoms occur.    Cough, Adult  Coughing is a reflex that clears your throat and your airways. Coughing helps to heal and protect your lungs. It is normal to cough occasionally, but a cough that happens with other symptoms or lasts a long time may be a sign of a condition that needs treatment. A cough may last only 2-3 weeks (acute), or it may last longer than 8 weeks (chronic). What are the causes? Coughing is commonly caused by:  Breathing in substances that irritate your lungs.  A viral or bacterial respiratory infection.  Allergies.  Asthma.  Postnasal drip.  Smoking.  Acid backing up from the stomach into the esophagus (gastroesophageal reflux).  Certain medicines.  Chronic lung problems, including COPD (or rarely, lung cancer).  Other medical conditions such as heart failure. Follow these instructions at home: Pay attention to any changes in your symptoms. Take these actions to help with your discomfort:  Take medicines only as told by your health care provider. ? If you were prescribed an antibiotic medicine, take it as told by your health care provider. Do not stop taking the antibiotic even if you start to feel better. ? Talk with your health care provider before you take a cough suppressant medicine.  Drink enough fluid to keep your urine clear or pale yellow.  If the air is dry, use a cold steam vaporizer or humidifier in your bedroom or your home to help loosen secretions.   Avoid anything that causes you to cough at work or at home.  If your cough is worse at night, try sleeping in a semi-upright position.  Avoid cigarette smoke. If you smoke, quit smoking. If you need help quitting, ask your health care provider.  Avoid caffeine.  Avoid alcohol.  Rest as needed. Contact a health care provider if:  You have new symptoms.  You cough up pus.  Your cough does not get better after 2-3 weeks, or your cough gets worse.  You cannot control your cough with suppressant medicines and you are losing sleep.  You develop pain that is getting worse or pain that is not controlled with pain medicines.  You have a fever.  You have unexplained weight loss.  You have night sweats. Get help right away if:  You cough up blood.  You have difficulty breathing.  Your heartbeat is very fast. This information is not intended to replace advice given to you by your health care provider. Make sure you discuss any questions you have with your health care provider. Document Released: 11/18/2010 Document Revised: 10/28/2015 Document Reviewed: 07/29/2014 Elsevier Interactive Patient Education  2019 [employer].

## 2018-09-30 NOTE — Progress Notes (Signed)
Virtual Visit via Telephone Note  I connected with Donald [name] on 09/30/18 at 2:13 PM by telephone and verified that I am speaking with the correct person using two identifiers.   I discussed the limitations, risks, security and privacy concerns of performing an evaluation and management service by telephone and the availability of in person appointments. I also discussed with the patient that there may be a patient responsible charge related to this service. The patient expressed understanding and agreed to proceed, consent obtained  Chief complaint: cough   History of Present Illness:  Cough: 5 min chart review. Evaluated in the ER 1 week ago with right-sided chest pain that started 2 to 3 days prior.  Was clearing throat more than normal.  Felt like he had wheezing on his right side of chest and heaviness, worse with deep inspiration.  Felt like he had more difficulty taking a deep breath.  No dyspnea, no lower extremity swelling or pain.  No known DVT/PE risk factors.  He had a normal troponin, normal d-dimer, normal CBC.  Chest x-ray performed was clear.  Blood pressure was elevated but plan for outpatient follow-up, thought to be due to some possible anxiety at that time.  He was advised to self quarantine for least 7 days and treat as if possible COVID-19.  Cough for about 10 days.  Now with change to discolored mucus. Yellow mucus in the morning, then clear throughout the day.   Feels like more mucus on the R side of the chest. More chest congestion on R side.  No fever.  Some difficulty taking a  deep breathe, but not short of breath. No bodyache/headache. No heartburn.  No sneezing.  Yellow nasal d/c in the morning, more clear throughout the day. Has been isolating at home.  occasional dizziness, noted yesterday - not now.  BP 125/78 at home yesterday.   Dtr with cough as well.    Tx: otc ibuprofen, ibuprofen.  Warm water to nose.         Patient Active  Problem List   Diagnosis Date Noted  . Lower urinary tract symptoms (LUTS) 04/19/2018  . Foamy urine 04/19/2018  . Chest pain 01/20/2016  . Acute URI 01/20/2016  . Hypokalemia 01/20/2016  . Right bundle branch block 03/11/2010  . LUMBAGO 03/09/2010  . DYSURIA 03/09/2010  . History of kidney stones 12/10/2009  . Essential hypertension 05/14/2009  . PARASOMNIA 05/14/2009  . Hyperlipidemia 05/13/2009  . G E R D 05/13/2009   Past Medical History:  Diagnosis Date  . DEPRESSION, MILD   . Dysuria   . G E R D   . Hypercholesterolemia   . Hyperlipidemia    NOT TAKING MEDICATION  . Hypertension   . Lumbago   . NEPHROLITHIASIS, HX OF   . PARASOMNIA   . Right bundle branch block    Past Surgical History:  Procedure Laterality Date  . CARDIAC CATHETERIZATION N/A 01/19/2016   Procedure: Left Heart Cath and Coronary Angiography;  Surgeon: Sherren Mocha, MD;  Location: [facility];  Service: Cardiovascular;  Laterality: N/A;  . HAND SURGERY    . right and left hand     Allergies  Allergen Reactions  . Ace Inhibitors Cough    Lisinopril caused cough   Prior to Admission medications   Medication Sig Start Date End Date Taking? Authorizing Provider  amLODipine (NORVASC) 10 MG tablet Take 1 tablet (10 mg total) by mouth daily. 02/28/18  Yes [name],  MD  aspirin 81 MG tablet Take 1 tablet (81 mg total) by mouth daily. 10/29/15  Yes Jaynee Eagles, PA-C  losartan-hydrochlorothiazide (HYZAAR) 100-25 MG tablet Take 1 tablet by mouth daily. 02/28/18  Yes [name], MD   Social History   Socioeconomic History  . Marital status: Married    Spouse name: Not on file  . Number of children: 2  . Years of education: 72  . Highest education level: Not on file  Occupational History  . Occupation: IT sales professional: [employer]  Social Needs  . Financial resource strain: Not on file  . Food insecurity:    Worry: Not on file    Inability: Not on file  .  Transportation needs:    Medical: Not on file    Non-medical: Not on file  Tobacco Use  . Smoking status: Never Smoker  . Smokeless tobacco: Never Used  Substance and Sexual Activity  . Alcohol use: No    Alcohol/week: 0.0 standard drinks  . Drug use: No  . Sexual activity: Not on file  Lifestyle  . Physical activity:    Days per week: Not on file    Minutes per session: Not on file  . Stress: Not on file  Relationships  . Social connections:    Talks on phone: Not on file    Gets together: Not on file    Attends religious service: Not on file    Active member of club or organization: Not on file    Attends meetings of clubs or organizations: Not on file    Relationship status: Not on file  . Intimate partner violence:    Fear of current or ex partner: Not on file    Emotionally abused: Not on file    Physically abused: Not on file    Forced sexual activity: Not on file  Other Topics Concern  . Not on file  Social History Narrative   ** Merged History Encounter **         Observations/Objective: Speaking in full sentences on phone, no respiratory distress.  Appropriate responses.  Assessment and Plan: Cough - Plan: doxycycline (VIBRA-TABS) 100 MG tablet  Sinus congestion - Plan: doxycycline (VIBRA-TABS) 100 MG tablet Initial probable viral illness, now with more productive cough with discolored phlegm, discolored nasal discharge, sinobronchitis/early lower respiratory infection with possible secondary bacterial component.  -Start doxycycline, but side effects and risks discussed.  Expectorant such as Mucinex okay, saline nasal spray, other symptomatic care.  RTC/ER precautions.   -Recommended out of work, and maintain isolation as much as possible while symptomatic, return to work guidelines discussed.  Follow Up Instructions:     Patient Instructions  Saline nasal spray 3-4 times per day for nasal congestion, mucinex or mucinex DM for cough, and can start  antibiotic as we discussed.   If any shortness of breath at rest, dizziness returns, or continuing to worsen - be seen in emergency room.  Remain at home until no fevers for 3 days (off tylenol or other fever reducing medicine) and symptoms must be improving before you can return to work.    Return to the clinic or go to the nearest emergency room if any of your symptoms worsen or new symptoms occur.    Cough, Adult  Coughing is a reflex that clears your throat and your airways. Coughing helps to heal and protect your lungs. It is normal to cough occasionally, but a cough that happens with  other symptoms or lasts a long time may be a sign of a condition that needs treatment. A cough may last only 2-3 weeks (acute), or it may last longer than 8 weeks (chronic). What are the causes? Coughing is commonly caused by:  Breathing in substances that irritate your lungs.  A viral or bacterial respiratory infection.  Allergies.  Asthma.  Postnasal drip.  Smoking.  Acid backing up from the stomach into the esophagus (gastroesophageal reflux).  Certain medicines.  Chronic lung problems, including COPD (or rarely, lung cancer).  Other medical conditions such as heart failure. Follow these instructions at home: Pay attention to any changes in your symptoms. Take these actions to help with your discomfort:  Take medicines only as told by your health care provider. ? If you were prescribed an antibiotic medicine, take it as told by your health care provider. Do not stop taking the antibiotic even if you start to feel better. ? Talk with your health care provider before you take a cough suppressant medicine.  Drink enough fluid to keep your urine clear or pale yellow.  If the air is dry, use a cold steam vaporizer or humidifier in your bedroom or your home to help loosen secretions.  Avoid anything that causes you to cough at work or at home.  If your cough is worse at night, try  sleeping in a semi-upright position.  Avoid cigarette smoke. If you smoke, quit smoking. If you need help quitting, ask your health care provider.  Avoid caffeine.  Avoid alcohol.  Rest as needed. Contact a health care provider if:  You have new symptoms.  You cough up pus.  Your cough does not get better after 2-3 weeks, or your cough gets worse.  You cannot control your cough with suppressant medicines and you are losing sleep.  You develop pain that is getting worse or pain that is not controlled with pain medicines.  You have a fever.  You have unexplained weight loss.  You have night sweats. Get help right away if:  You cough up blood.  You have difficulty breathing.  Your heartbeat is very fast. This information is not intended to replace advice given to you by your health care provider. Make sure you discuss any questions you have with your health care provider. Document Released: 11/18/2010 Document Revised: 10/28/2015 Document Reviewed: 07/29/2014 Elsevier Interactive Patient Education  2019 [employer].    I discussed the assessment and treatment plan with the patient. The patient was provided an opportunity to ask questions and all were answered. The patient agreed with the plan and demonstrated an understanding of the instructions.   The patient was advised to call back or seek an in-person evaluation if the symptoms worsen or if the condition fails to improve as anticipated.  I provided 11 minutes of non-face-to-face time during this encounter.  English spoken with understanding expressed.  All questions answered.  Signed,   [name], MD [facility].  09/30/18

## 2018-09-30 NOTE — Progress Notes (Signed)
Pt states he has has a cough with nasal congestion x 3 weeks. Pt states the OTC medication is not and the cough is getting worst. He states there is no SHOB or chest discomfort but the congestion is a concern.

## 2018-10-02 ENCOUNTER — Telehealth: Payer: Self-pay | Admitting: Family Medicine

## 2018-10-02 NOTE — Telephone Encounter (Signed)
Copied from [place] [id number]. Topic: General - Inquiry >> Oct 02, 2018 10:41 AM Vernona Rieger wrote: Reason for CRM: patient was advised by Dr Carlota Raspberry that he would have his AVS on his mychart so that he could give to employer. Patient states it is not there. Please upload for him, thanks

## 2018-10-03 NOTE — Telephone Encounter (Signed)
Patient called in stating he is still waiting for his AVS on his chart for his employer.

## 2018-10-04 NOTE — Telephone Encounter (Signed)
LVM for patient said to check my messages or inbox if he cant find it there we can print it out and he can come get it or have it mailed to him. Also would write a letter for his employer so he doesn't have to give AVS.

## 2018-10-04 NOTE — Telephone Encounter (Signed)
Pt called again for his AVS. Please advise.

## 2018-10-18 ENCOUNTER — Telehealth: Payer: Self-pay

## 2018-10-18 NOTE — Telephone Encounter (Signed)
I have received disability form from [employer] for the pt. I have called the company and representative  has given me the okay to change the date to when the pt was seen to sent office notes.   Disability Form asking work to see if pt needs any work restrictions and limitations has been placed in provider folder to be completed.

## 2018-10-23 NOTE — Telephone Encounter (Signed)
[place] from [employer] called checking on the status of this message. Please advise at  [phone]

## 2018-10-23 NOTE — Telephone Encounter (Signed)
Note had been placed on paperwork for clarification of details.  Placed at front desk to obtain more info and I can complete on Friday.

## 2018-10-24 NOTE — Telephone Encounter (Signed)
Forms placed back in provider folder to be completed.   Pt has returned to work on 10/05/2018 and he stated that he was fever free with improved sx at that time.

## 2018-10-25 NOTE — Telephone Encounter (Signed)
Form signed, placed in fax box in back office.

## 2018-11-04 ENCOUNTER — Encounter: Payer: Self-pay | Admitting: Emergency Medicine

## 2018-11-04 ENCOUNTER — Telehealth: Payer: Self-pay | Admitting: Emergency Medicine

## 2018-11-04 NOTE — Telephone Encounter (Signed)
Dr Carlota Raspberry, I have a work form on this patient needing information look like the form has been filled out. Paper work states they need office note, when patient can return back to work and if so is there any limitation. Looking back over the office notes patient supposed to quarantine himself for 7 days. Just spoke with patient he just need this formed filled out and fax back to work. He went back to work on Oct 05, 2018. Form is in your box

## 2018-11-05 NOTE — Telephone Encounter (Signed)
Form completed prior, but completed again today to be sent.

## 2018-11-10 ENCOUNTER — Other Ambulatory Visit: Payer: Self-pay | Admitting: Family Medicine

## 2018-11-10 DIAGNOSIS — I10 Essential (primary) hypertension: Secondary | ICD-10-CM

## 2018-11-10 NOTE — Telephone Encounter (Signed)
Requested Prescriptions  Pending Prescriptions Disp Refills  . losartan-hydrochlorothiazide (HYZAAR) 100-25 MG tablet [Pharmacy Med Name: LOSARTAN-HCTZ 100-25 MG TAB] 30 tablet 0    Sig: TAKE 1 TABLET BY MOUTH EVERY DAY     Cardiovascular: ARB + Diuretic Combos Failed - 11/10/2018  9:07 AM      Failed - K in normal range and within 180 days    Potassium  Date Value Ref Range Status  09/23/2018 3.3 (L) 3.5 - 5.1 mmol/L Final         Failed - Last BP in normal range    BP Readings from Last 1 Encounters:  09/24/18 123/90         Failed - Valid encounter within last 6 months    Recent Outpatient Visits          6 months ago Lower urinary tract symptoms (LUTS)   [facility], Ines Bloomer, MD   8 months ago Hyperlipidemia, unspecified hyperlipidemia type   [facility], Ranell Patrick, MD   11 months ago Hyperlipidemia, unspecified hyperlipidemia type   [facility], Ranell Patrick, MD   1 year ago DDD (degenerative disc disease), cervical   [facility], Ranell Patrick, MD   1 year ago Elevated CPK   [facility], Ranell Patrick, MD             Passed - Na in normal range and within 180 days    Sodium  Date Value Ref Range Status  09/23/2018 136 135 - 145 mmol/L Final  04/19/2018 140 134 - 144 mmol/L Final         Passed - Cr in normal range and within 180 days    Creat  Date Value Ref Range Status  03/28/2016 0.92 0.60 - 1.35 mg/dL Final   Creatinine, Ser  Date Value Ref Range Status  09/23/2018 1.01 0.61 - 1.24 mg/dL Final         Passed - Ca in normal range and within 180 days    Calcium  Date Value Ref Range Status  09/23/2018 9.3 8.9 - 10.3 mg/dL Final         Passed - Patient is not pregnant

## 2018-11-12 NOTE — Telephone Encounter (Signed)
done

## 2018-12-03 ENCOUNTER — Other Ambulatory Visit: Payer: Self-pay | Admitting: Family Medicine

## 2018-12-03 DIAGNOSIS — I10 Essential (primary) hypertension: Secondary | ICD-10-CM

## 2019-01-04 ENCOUNTER — Other Ambulatory Visit: Payer: Self-pay | Admitting: Family Medicine

## 2019-01-04 DIAGNOSIS — I10 Essential (primary) hypertension: Secondary | ICD-10-CM

## 2019-01-15 ENCOUNTER — Other Ambulatory Visit: Payer: Self-pay

## 2019-01-15 ENCOUNTER — Ambulatory Visit (INDEPENDENT_AMBULATORY_CARE_PROVIDER_SITE_OTHER): Payer: 59 | Admitting: Family Medicine

## 2019-01-15 ENCOUNTER — Encounter: Payer: Self-pay | Admitting: Family Medicine

## 2019-01-15 VITALS — BP 151/88 | HR 81 | Temp 98.2°F | Resp 12 | Wt 255.8 lb

## 2019-01-15 DIAGNOSIS — Z1211 Encounter for screening for malignant neoplasm of colon: Secondary | ICD-10-CM

## 2019-01-15 DIAGNOSIS — R6889 Other general symptoms and signs: Secondary | ICD-10-CM | POA: Diagnosis not present

## 2019-01-15 DIAGNOSIS — Z125 Encounter for screening for malignant neoplasm of prostate: Secondary | ICD-10-CM

## 2019-01-15 DIAGNOSIS — Z0001 Encounter for general adult medical examination with abnormal findings: Secondary | ICD-10-CM | POA: Diagnosis not present

## 2019-01-15 DIAGNOSIS — R300 Dysuria: Secondary | ICD-10-CM

## 2019-01-15 DIAGNOSIS — I10 Essential (primary) hypertension: Secondary | ICD-10-CM

## 2019-01-15 DIAGNOSIS — E785 Hyperlipidemia, unspecified: Secondary | ICD-10-CM | POA: Diagnosis not present

## 2019-01-15 DIAGNOSIS — Z0000 Encounter for general adult medical examination without abnormal findings: Secondary | ICD-10-CM

## 2019-01-15 DIAGNOSIS — R0989 Other specified symptoms and signs involving the circulatory and respiratory systems: Secondary | ICD-10-CM

## 2019-01-15 LAB — POCT URINALYSIS DIP (MANUAL ENTRY)
Bilirubin, UA: NEGATIVE
Blood, UA: NEGATIVE
Glucose, UA: NEGATIVE mg/dL
Ketones, POC UA: NEGATIVE mg/dL
Leukocytes, UA: NEGATIVE
Nitrite, UA: NEGATIVE
Protein Ur, POC: NEGATIVE mg/dL
Spec Grav, UA: 1.025 (ref 1.010–1.025)
Urobilinogen, UA: 0.2 E.U./dL
pH, UA: 6 (ref 5.0–8.0)

## 2019-01-15 LAB — POC MICROSCOPIC URINALYSIS (UMFC): Mucus: ABSENT

## 2019-01-15 MED ORDER — FLUTICASONE PROPIONATE 50 MCG/ACT NA SUSP: 1.0000 | Freq: Every day | NASAL | 6 refills | Status: DC

## 2019-01-15 MED ORDER — LOSARTAN POTASSIUM-HCTZ 100-25 MG PO TABS: 1.0000 | ORAL_TABLET | Freq: Every day | ORAL | 1 refills | Status: DC

## 2019-01-15 MED ORDER — AMLODIPINE BESYLATE 10 MG PO TABS: 10.0000 mg | ORAL_TABLET | Freq: Every day | ORAL | 1 refills | Status: DC

## 2019-01-15 NOTE — Patient Instructions (Addendum)
Try flonase for possible allergies that are causing throat drainage/throat clearing. recheck in few weeks if not improved.  Return to the clinic or go to the nearest emergency room if any of your symptoms worsen or new symptoms occur.  Depending on cholesterol level, may need to restart medicine again.   Let me know if referral needed for eye doctor, but they can evaluate your vision and need for glasses.   I will check some urine tests today, but follow up in next 2 weeks to recheck blood pressure and discuss urine symptoms further. Return to the clinic or go to the nearest emergency room if any of your symptoms worsen or new symptoms occur.     If you have lab work done today you will be contacted with your lab results within the next 2 weeks.  If you have not heard from [place] then please contact us. The fastest way to get your results is to register for My Chart.   IF you received an x-ray today, you will receive an invoice from [facility]. Please contact [facility] at [phone] with questions or concerns regarding your invoice.   IF you received labwork today, you will receive an invoice from [facility]. Please contact [facility] at [phone] with questions or concerns regarding your invoice.   Our billing staff will not be able to assist you with questions regarding bills from these companies.  You will be contacted with the lab results as soon as they are available. The fastest way to get your results is to activate your My Chart account. Instructions are located on the last page of this paperwork. If you have not heard from [place] regarding the results in 2 weeks, please contact this office.     Keeping you healthy  Get these tests  Blood pressure- Have your blood pressure checked once a year by your healthcare provider.  Normal blood pressure is 120/80  Weight- Have your body mass index (BMI) calculated to screen for obesity.  BMI is a measure of body fat based  on height and weight. You can also calculate your own BMI at [employer].  Cholesterol- Have your cholesterol checked every year.  Diabetes- Have your blood sugar checked regularly if you have high blood pressure, high cholesterol, have a family history of diabetes or if you are overweight.  Screening for Colon Cancer- Colonoscopy starting at age 61.  Screening may begin sooner depending on your family history and other health conditions. Follow up colonoscopy as directed by your Gastroenterologist.  Screening for Prostate Cancer- Both blood work (PSA) and a rectal exam help screen for Prostate Cancer.  Screening begins at age 13 with African-American men and at age 26 with Caucasian men.  Screening may begin sooner depending on your family history.  Take these medicines  Aspirin- One aspirin daily can help prevent Heart disease and Stroke.  Flu shot- Every fall.  Tetanus- Every 10 years.  Zostavax- Once after the age of 83 to prevent Shingles.  Pneumonia shot- Once after the age of 40; if you are younger than 16, ask your healthcare provider if you need a Pneumonia shot.  Take these steps  Don't smoke- If you do smoke, talk to your doctor about quitting.  For tips on how to quit, go to www.smokefree.gov or call 1-800-QUIT-NOW.  Be physically active- Exercise 5 days a week for at least 30 minutes.  If you are not already physically active start slow and gradually work up to 30 minutes of  moderate physical activity.  Examples of moderate activity include walking briskly, mowing the yard, dancing, swimming, bicycling, etc.  Eat a healthy diet- Eat a variety of healthy food such as fruits, vegetables, low fat milk, low fat cheese, yogurt, lean meant, poultry, fish, beans, tofu, etc. For more information go to www.thenutritionsource.org  Drink alcohol in moderation- Limit alcohol intake to less than two drinks a day. Never drink and drive.  Dentist- Brush and floss twice  daily; visit your dentist twice a year.  Depression- Your emotional health is as important as your physical health. If you're feeling down, or losing interest in things you would normally enjoy please talk to your healthcare provider.  Eye exam- Visit your eye doctor every year.  Safe sex- If you may be exposed to a sexually transmitted infection, use a condom.  Seat belts- Seat belts can save your life; always wear one.  Smoke/Carbon Monoxide detectors- These detectors need to be installed on the appropriate level of your home.  Replace batteries at least once a year.  Skin cancer- When out in the sun, cover up and use sunscreen 15 SPF or higher.  Violence- If anyone is threatening you, please tell your healthcare provider.  Living Will/ Health care power of attorney- Speak with your healthcare provider and family.

## 2019-01-15 NOTE — Progress Notes (Signed)
Subjective:    Patient ID: Donald [name], male    DOB: [birth year], 48 y.o.   MRN: [id number]  HPI Donald [name] is a 48 y.o. male Presents today for: Chief Complaint  Patient presents with  . Annual Exam    Patient here for annual exam and need refill on meds   English spoken/preferred - interpreter declined.   Hypertension: BP Readings from Last 3 Encounters:  01/15/19 (!) 151/88  09/24/18 123/90  07/08/18 119/75   Lab Results  Component Value Date   CREATININE 1.01 09/23/2018  Amlodipine 10 mg daily, losartan HCT 100/25 mg daily.  He is on aspirin 81 mg daily. Has not taken meds yet today.  Home readings controlled. 127/78, 125/75.  No new side effects with meds.   Occasional clearing throat, but cough better from last visit.  Occasional nasal congestion, no heartburn.   Hyperlipidemia Lab Results  Component Value Date   CHOL 204 (H) 02/28/2018   HDL 44 02/28/2018   LDLCALC 144 (H) 02/28/2018   TRIG 80 02/28/2018   CHOLHDL 4.6 02/28/2018  10-year ASCVD risk was calculated at 9.4 in September 2019. Possible statin intolerance previously, recommend a different statin or repeat testing in 3 to 6 months.  Not currently on statin.  Normal CPK reading and September 2019.[id number]  Occasional burning with urination past few weeks, no blood, fever/abd pain or back pain. Comes and goes.  No penile d/c or rash, no new sexual partners.   Cancer screening: Colonoscopy: agrees to referral. FH of colon CA in mom in late 30's or 61's. He has not had prior colonoscopy.  Prostate: requests testing after r/b discussion.   Immunization History  Administered Date(s) Administered  . Influenza Whole 03/09/2010  . Influenza,inj,Quad PF,6+ Mos 03/02/2017  . Td 03/09/2010  . Tdap 03/02/2017   Depression screen [facility] 2/9 01/15/2019 09/30/2018 04/19/2018 02/28/2018 11/29/2017  Decreased Interest 0 0 0 0 0  Down, Depressed, Hopeless 0 0 0 0 0  PHQ - 2 Score 0 0 0 0 0    Hearing  Screening   125Hz  250Hz  500Hz  1000Hz  2000Hz  3000Hz  4000Hz  6000Hz  8000Hz   Right ear:           Left ear:             Visual Acuity Screening   Right eye Left eye Both eyes  Without correction: 20/20 20/20 20/20   With correction:     reading glasses at times only.  Dental: rescheduled d/t pandemic - has ongoing care.   Exercise: occasional walking.   Patient Active Problem List   Diagnosis Date Noted  . Lower urinary tract symptoms (LUTS) 04/19/2018  . Foamy urine 04/19/2018  . Chest pain 01/20/2016  . Acute URI 01/20/2016  . Hypokalemia 01/20/2016  . Right bundle branch block 03/11/2010  . LUMBAGO 03/09/2010  . DYSURIA 03/09/2010  . History of kidney stones 12/10/2009  . Essential hypertension 05/14/2009  . PARASOMNIA 05/14/2009  . Hyperlipidemia 05/13/2009  . G E R D 05/13/2009   Past Medical History:  Diagnosis Date  . DEPRESSION, MILD   . Dysuria   . G E R D   . Hypercholesterolemia   . Hyperlipidemia    NOT TAKING MEDICATION  . Hypertension   . Lumbago   . NEPHROLITHIASIS, HX OF   . PARASOMNIA   . Right bundle branch block    Past Surgical History:  Procedure Laterality Date  . CARDIAC CATHETERIZATION N/A 01/19/2016   Procedure: Left Heart Cath and  Coronary Angiography;  Surgeon: Sherren Mocha, MD;  Location: [facility];  Service: Cardiovascular;  Laterality: N/A;  . HAND SURGERY    . right and left hand     Allergies  Allergen Reactions  . Ace Inhibitors Cough    Lisinopril caused cough   Prior to Admission medications   Medication Sig Start Date End Date Taking? Authorizing Provider  amLODipine (NORVASC) 10 MG tablet Take 1 tablet (10 mg total) by mouth daily. 02/28/18  Yes [name], MD  aspirin 81 MG tablet Take 1 tablet (81 mg total) by mouth daily. 10/29/15  Yes Jaynee Eagles, PA-C  losartan-hydrochlorothiazide (HYZAAR) 100-25 MG tablet TAKE 1 TABLET BY MOUTH EVERY DAY 11/10/18  Yes [name], MD   Social History    Socioeconomic History  . Marital status: Married    Spouse name: Not on file  . Number of children: 2  . Years of education: 40  . Highest education level: Not on file  Occupational History  . Occupation: IT sales professional: [employer]  Social Needs  . Financial resource strain: Not on file  . Food insecurity    Worry: Not on file    Inability: Not on file  . Transportation needs    Medical: Not on file    Non-medical: Not on file  Tobacco Use  . Smoking status: Never Smoker  . Smokeless tobacco: Never Used  Substance and Sexual Activity  . Alcohol use: No    Alcohol/week: 0.0 standard drinks  . Drug use: No  . Sexual activity: Not on file  Lifestyle  . Physical activity    Days per week: Not on file    Minutes per session: Not on file  . Stress: Not on file  Relationships  . Social Herbalist on phone: Not on file    Gets together: Not on file    Attends religious service: Not on file    Active member of club or organization: Not on file    Attends meetings of clubs or organizations: Not on file    Relationship status: Not on file  . Intimate partner violence    Fear of current or ex partner: Not on file    Emotionally abused: Not on file    Physically abused: Not on file    Forced sexual activity: Not on file  Other Topics Concern  . Not on file  Social History Narrative   ** Merged History Encounter **        Review of Systems 13 point review of systems per patient health survey noted.  Negative other than as indicated above or in HPI.      Objective:   Physical Exam Vitals signs reviewed.  Constitutional:      Appearance: He is well-developed.  HENT:     Head: Normocephalic and atraumatic.     Right Ear: External ear normal.     Left Ear: External ear normal.  Eyes:     Conjunctiva/sclera: Conjunctivae normal.     Pupils: Pupils are equal, round, and reactive to light.  Neck:     Musculoskeletal: Normal range of motion  and neck supple.     Thyroid: No thyromegaly.  Cardiovascular:     Rate and Rhythm: Normal rate and regular rhythm.     Heart sounds: Normal heart sounds.  Pulmonary:     Effort: Pulmonary effort is normal. No respiratory distress.  Breath sounds: Normal breath sounds. No wheezing.  Abdominal:     General: There is no distension.     Palpations: Abdomen is soft.     Tenderness: There is no abdominal tenderness.     Hernia: There is no hernia in the left inguinal area.  Genitourinary:    Prostate: Normal.  Musculoskeletal: Normal range of motion.        General: No tenderness.  Lymphadenopathy:     Cervical: No cervical adenopathy.  Skin:    General: Skin is warm and dry.  Neurological:     Mental Status: He is alert and oriented to person, place, and time.     Deep Tendon Reflexes: Reflexes are normal and symmetric.  Psychiatric:        Behavior: Behavior normal.    Vitals:   01/15/19 1102  BP: (!) 151/88  Pulse: 81  Resp: 12  Temp: 98.2 F (36.8 C)  TempSrc: Oral  SpO2: 96%  Weight: 255 lb 12.8 oz (116 kg)   Results for orders placed or performed in visit on 01/15/19  POCT urinalysis dipstick  Result Value Ref Range   Color, UA yellow yellow   Clarity, UA clear clear   Glucose, UA negative negative mg/dL   Bilirubin, UA negative negative   Ketones, POC UA negative negative mg/dL   Spec Grav, UA 1.025 1.010 - 1.025   Blood, UA negative negative   pH, UA 6.0 5.0 - 8.0   Protein Ur, POC negative negative mg/dL   Urobilinogen, UA 0.2 0.2 or 1.0 E.U./dL   Nitrite, UA Negative Negative   Leukocytes, UA Negative Negative  POCT Microscopic Urinalysis ([facility])  Result Value Ref Range   WBC,UR,HPF,POC None None WBC/hpf   RBC,UR,HPF,POC None None RBC/hpf   Bacteria None None, Too numerous to count   Mucus Absent Absent   Epithelial Cells, UR Per Microscopy None None, Too numerous to count cells/hpf        Assessment & Plan:   Donald [name] is a 49 y.o.  male Annual physical exam - Plan:    --anticipatory guidance as below in AVS, screening labs above. Health maintenance items as above in HPI discussed/recommended as applicable.   Essential hypertension - Plan: losartan-hydrochlorothiazide (HYZAAR) 100-25 MG tablet, amLODipine (NORVASC) 10 MG tablet, Comprehensive metabolic panel,   - -Elevated as not had taken medicines yet today.  Home readings okay.  Continue same regimen check labs, RTC precautions  Hyperlipidemia, unspecified hyperlipidemia type - Plan: Lipid Panel,   -Borderline ASCVD risk prior, check labs then determine statin need/recommendations.  Previous CPK reassuring, could consider other statin.  Throat clearing - Plan: fluticasone (FLONASE) 50 MCG/ACT nasal spray,   -Probable postnasal drip from allergies.  Trial of Flonase with RTC precautions if persistent or worsening  Dysuria - Plan: POCT urinalysis dipstick, POCT Microscopic Urinalysis ([facility]),   -Reassuring urinalysis.  Intermittent symptoms.  Check PSA, follow-up to discuss further next few weeks with RTC precautions if worsening sooner  Screening for malignant neoplasm of prostate - Plan: PSA,   - We discussed pros and cons of prostate cancer screening, and after this discussion, he chose to have screening done. PSA obtained, and no concerning findings on DRE.    Special screening for malignant neoplasms, colon - Plan: Ambulatory referral to Gastroenterology,  -Referral placed for screening colonoscopy.   Meds ordered this encounter  Medications  . losartan-hydrochlorothiazide (HYZAAR) 100-25 MG tablet    Sig: Take 1 tablet by mouth daily.  Dispense:  90 tablet    Refill:  1  . amLODipine (NORVASC) 10 MG tablet    Sig: Take 1 tablet (10 mg total) by mouth daily.    Dispense:  90 tablet    Refill:  1  . fluticasone (FLONASE) 50 MCG/ACT nasal spray    Sig: Place 1 spray into both nostrils daily.    Dispense:  16 g    Refill:  6   Patient Instructions     Try flonase for possible allergies that are causing throat drainage/throat clearing. recheck in few weeks if not improved.  Return to the clinic or go to the nearest emergency room if any of your symptoms worsen or new symptoms occur.  Depending on cholesterol level, may need to restart medicine again.   Let me know if referral needed for eye doctor, but they can evaluate your vision and need for glasses.   I will check some urine tests today, but follow up in next 2 weeks to recheck blood pressure and discuss urine symptoms further. Return to the clinic or go to the nearest emergency room if any of your symptoms worsen or new symptoms occur.     If you have lab work done today you will be contacted with your lab results within the next 2 weeks.  If you have not heard from [place] then please contact us. The fastest way to get your results is to register for My Chart.   IF you received an x-ray today, you will receive an invoice from [facility]. Please contact [facility] at [phone] with questions or concerns regarding your invoice.   IF you received labwork today, you will receive an invoice from [place]. Please contact [facility] at [phone] with questions or concerns regarding your invoice.   Our billing staff will not be able to assist you with questions regarding bills from these companies.  You will be contacted with the lab results as soon as they are available. The fastest way to get your results is to activate your My Chart account. Instructions are located on the last page of this paperwork. If you have not heard from [place] regarding the results in 2 weeks, please contact this office.     Keeping you healthy  Get these tests  Blood pressure- Have your blood pressure checked once a year by your healthcare provider.  Normal blood pressure is 120/80  Weight- Have your body mass index (BMI) calculated to screen for obesity.  BMI is a measure of body fat based  on height and weight. You can also calculate your own BMI at [employer][email or web address].  Cholesterol- Have your cholesterol checked every year.  Diabetes- Have your blood sugar checked regularly if you have high blood pressure, high cholesterol, have a family history of diabetes or if you are overweight.  Screening for Colon Cancer- Colonoscopy starting at age 47.  Screening may begin sooner depending on your family history and other health conditions. Follow up colonoscopy as directed by your Gastroenterologist.  Screening for Prostate Cancer- Both blood work (PSA) and a rectal exam help screen for Prostate Cancer.  Screening begins at age 59 with African-American men and at age 91 with Caucasian men.  Screening may begin sooner depending on your family history.  Take these medicines  Aspirin- One aspirin daily can help prevent Heart disease and Stroke.  Flu shot- Every fall.  Tetanus- Every 10 years.  Zostavax- Once after the age of 65 to prevent Shingles.  Pneumonia shot- Once  after the age of 86; if you are younger than 50, ask your healthcare provider if you need a Pneumonia shot.  Take these steps  Don't smoke- If you do smoke, talk to your doctor about quitting.  For tips on how to quit, go to www.smokefree.gov or call 1-800-QUIT-NOW.  Be physically active- Exercise 5 days a week for at least 30 minutes.  If you are not already physically active start slow and gradually work up to 30 minutes of moderate physical activity.  Examples of moderate activity include walking briskly, mowing the yard, dancing, swimming, bicycling, etc.  Eat a healthy diet- Eat a variety of healthy food such as fruits, vegetables, low fat milk, low fat cheese, yogurt, lean meant, poultry, fish, beans, tofu, etc. For more information go to www.thenutritionsource.org  Drink alcohol in moderation- Limit alcohol intake to less than two drinks a day. Never drink and drive.  Dentist- Brush and floss twice  daily; visit your dentist twice a year.  Depression- Your emotional health is as important as your physical health. If you're feeling down, or losing interest in things you would normally enjoy please talk to your healthcare provider.  Eye exam- Visit your eye doctor every year.  Safe sex- If you may be exposed to a sexually transmitted infection, use a condom.  Seat belts- Seat belts can save your life; always wear one.  Smoke/Carbon Monoxide detectors- These detectors need to be installed on the appropriate level of your home.  Replace batteries at least once a year.  Skin cancer- When out in the sun, cover up and use sunscreen 15 SPF or higher.  Violence- If anyone is threatening you, please tell your healthcare provider.  Living Will/ Health care power of attorney- Speak with your healthcare provider and family.  Signed,   [name], MD [facility].  01/15/19 8:27 PM

## 2019-01-16 LAB — COMPREHENSIVE METABOLIC PANEL
ALT: 16 IU/L (ref 0–44)
AST: 19 IU/L (ref 0–40)
Albumin/Globulin Ratio: 1.4 (ref 1.2–2.2)
Albumin: 4.4 g/dL (ref 4.0–5.0)
Alkaline Phosphatase: 49 IU/L (ref 39–117)
BUN/Creatinine Ratio: 19 (ref 9–20)
BUN: 14 mg/dL (ref 6–24)
Bilirubin Total: 0.5 mg/dL (ref 0.0–1.2)
CO2: 24 mmol/L (ref 20–29)
Calcium: 9.4 mg/dL (ref 8.7–10.2)
Chloride: 102 mmol/L (ref 96–106)
Creatinine, Ser: 0.74 mg/dL — ABNORMAL LOW (ref 0.76–1.27)
GFR calc Af Amer: 124 mL/min/{1.73_m2} (ref >59)
GFR calc non Af Amer: 108 mL/min/{1.73_m2} (ref >59)
Globulin, Total: 3.1 g/dL (ref 1.5–4.5)
Glucose: 96 mg/dL (ref 65–99)
Potassium: 4.2 mmol/L (ref 3.5–5.2)
Sodium: 139 mmol/L (ref 134–144)
Total Protein: 7.5 g/dL (ref 6.0–8.5)

## 2019-01-16 LAB — LIPID PANEL
Chol/HDL Ratio: 4.8 ratio (ref 0.0–5.0)
Cholesterol, Total: 220 mg/dL — ABNORMAL HIGH (ref 100–199)
HDL: 46 mg/dL (ref >39)
LDL Calculated: 156 mg/dL — ABNORMAL HIGH (ref 0–99)
Triglycerides: 89 mg/dL (ref 0–149)
VLDL Cholesterol Cal: 18 mg/dL (ref 5–40)

## 2019-01-16 LAB — PSA: Prostate Specific Ag, Serum: 0.4 ng/mL (ref 0.0–4.0)

## 2019-01-19 ENCOUNTER — Encounter: Payer: Self-pay | Admitting: Family Medicine

## 2019-01-25 MED ORDER — ATORVASTATIN CALCIUM 10 MG PO TABS: 10.0000 mg | ORAL_TABLET | Freq: Every day | ORAL | 0 refills | Status: DC

## 2019-01-29 ENCOUNTER — Other Ambulatory Visit: Payer: Self-pay

## 2019-01-29 ENCOUNTER — Encounter: Payer: Self-pay | Admitting: Family Medicine

## 2019-01-29 ENCOUNTER — Ambulatory Visit: Payer: 59 | Admitting: Family Medicine

## 2019-01-29 VITALS — BP 140/82 | HR 84 | Temp 98.5°F | Resp 16 | Wt 254.0 lb

## 2019-01-29 DIAGNOSIS — N342 Other urethritis: Secondary | ICD-10-CM | POA: Diagnosis not present

## 2019-01-29 DIAGNOSIS — R0789 Other chest pain: Secondary | ICD-10-CM | POA: Diagnosis not present

## 2019-01-29 DIAGNOSIS — I10 Essential (primary) hypertension: Secondary | ICD-10-CM

## 2019-01-29 DIAGNOSIS — R300 Dysuria: Secondary | ICD-10-CM | POA: Diagnosis not present

## 2019-01-29 MED ORDER — AZITHROMYCIN 250 MG PO TABS: 1000.0000 mg | ORAL_TABLET | Freq: Once | ORAL | 0 refills | Status: AC

## 2019-01-29 NOTE — Progress Notes (Signed)
Subjective:    Patient ID: Donald [name], male    DOB: Dec 05, 1970, 48 y.o.   MRN: [id number]  HPI Donald [name] is a 48 y.o. male Presents today for: Chief Complaint  Patient presents with  . urinary problem    2 week f/u on urine problem and f/u on BP. Urine symptoms are much better now. Bp has been running 137/84 or 145/82   Hypertension: BP Readings from Last 3 Encounters:  01/29/19 140/82  01/15/19 (!) 151/88  09/24/18 123/90   Lab Results  Component Value Date   CREATININE 0.74 (L) 01/15/2019   Home readings reportedly normal at last visit but was elevated in office as had not taken medications yet that day.  Continued same regimen. Taking norvasc 10mg  qd, losartan hct 100/25mg  qd.  Home BP 117-140/70-80. 137/83 yesterday.  No frozen meals. Min added salt.  No shortness of breath, but occasionally needs to take a deep breath - every few weeks. After taking those deep breaths for a few times, has pain in muscles of chest after taking multiple deep breaths- lasts remainder of day. No other chest pain or dyspnea on exertion.  No chest pain with exertion.  No recent prolonged car travel or air travel, no recent calf pain or swelling, no recent surgery.    Dysuria Intermittent symptoms discussed last visit.  Reassuring urinalysis at that time, PSA obtained which was also normal. Since last visit - about 2 times per day feels discomfort, but not every time. Burning noted at end of urination. Noted past 2 months.  No new sexual partners. Monogamous with spouse - she has not had recent vaginal infections/treatments.  Denies penile d/c, no frequency, no urgency. No hesitancy. No hematuria.  No abd pain, no fever.     Patient Active Problem List   Diagnosis Date Noted  . Lower urinary tract symptoms (LUTS) 04/19/2018  . Foamy urine 04/19/2018  . Chest pain 01/20/2016  . Acute URI 01/20/2016  . Hypokalemia 01/20/2016  . Right bundle branch block 03/11/2010  .  LUMBAGO 03/09/2010  . DYSURIA 03/09/2010  . History of kidney stones 12/10/2009  . Essential hypertension 05/14/2009  . PARASOMNIA 05/14/2009  . Hyperlipidemia 05/13/2009  . G E R D 05/13/2009   Past Medical History:  Diagnosis Date  . DEPRESSION, MILD   . Dysuria   . G E R D   . Hypercholesterolemia   . Hyperlipidemia    NOT TAKING MEDICATION  . Hypertension   . Lumbago   . NEPHROLITHIASIS, HX OF   . PARASOMNIA   . Right bundle branch block    Past Surgical History:  Procedure Laterality Date  . CARDIAC CATHETERIZATION N/A 01/19/2016   Procedure: Left Heart Cath and Coronary Angiography;  Surgeon: Sherren Mocha, MD;  Location: [facility];  Service: Cardiovascular;  Laterality: N/A;  . HAND SURGERY    . right and left hand     Allergies  Allergen Reactions  . Ace Inhibitors Cough    Lisinopril caused cough   Prior to Admission medications   Medication Sig Start Date End Date Taking? Authorizing Provider  amLODipine (NORVASC) 10 MG tablet Take 1 tablet (10 mg total) by mouth daily. 01/15/19  Yes [name], MD  aspirin 81 MG tablet Take 1 tablet (81 mg total) by mouth daily. 10/29/15  Yes Jaynee Eagles, PA-C  atorvastatin (LIPITOR) 10 MG tablet Take 1 tablet (10 mg total) by mouth daily. 01/25/19  Yes [name], MD  fluticasone (FLONASE) 50 MCG/ACT nasal spray Place 1 spray into both nostrils daily. 01/15/19  Yes [name], MD  losartan-hydrochlorothiazide (HYZAAR) 100-25 MG tablet Take 1 tablet by mouth daily. 01/15/19  Yes [name], MD   Social History   Socioeconomic History  . Marital status: Married    Spouse name: Not on file  . Number of children: 2  . Years of education: 9  . Highest education level: Not on file  Occupational History  . Occupation: IT sales professional: [employer]  Social Needs  . Financial resource strain: Not on file  . Food insecurity    Worry: Not on file    Inability: Not on file  .  Transportation needs    Medical: Not on file    Non-medical: Not on file  Tobacco Use  . Smoking status: Never Smoker  . Smokeless tobacco: Never Used  Substance and Sexual Activity  . Alcohol use: No    Alcohol/week: 0.0 standard drinks  . Drug use: No  . Sexual activity: Not on file  Lifestyle  . Physical activity    Days per week: Not on file    Minutes per session: Not on file  . Stress: Not on file  Relationships  . Social Herbalist on phone: Not on file    Gets together: Not on file    Attends religious service: Not on file    Active member of club or organization: Not on file    Attends meetings of clubs or organizations: Not on file    Relationship status: Not on file  . Intimate partner violence    Fear of current or ex partner: Not on file    Emotionally abused: Not on file    Physically abused: Not on file    Forced sexual activity: Not on file  Other Topics Concern  . Not on file  Social History Narrative   ** Merged History Encounter **        Review of Systems Per HPI.     Objective:   Physical Exam Vitals signs reviewed.  Constitutional:      Appearance: He is well-developed.  HENT:     Head: Normocephalic and atraumatic.  Eyes:     Pupils: Pupils are equal, round, and reactive to light.  Neck:     Vascular: No carotid bruit or JVD.  Cardiovascular:     Rate and Rhythm: Normal rate and regular rhythm.     Heart sounds: Normal heart sounds. No murmur. No friction rub. No gallop.   Pulmonary:     Effort: Pulmonary effort is normal. No respiratory distress.     Breath sounds: Normal breath sounds. No wheezing or rales.     Comments: Clear bs in all lung fields.  Abdominal:     Tenderness: There is no abdominal tenderness. There is no right CVA tenderness or left CVA tenderness.  Skin:    General: Skin is warm and dry.  Neurological:     Mental Status: He is alert and oriented to person, place, and time.    Vitals:   01/29/19  1155  BP: 140/82  Pulse: 84  Resp: 16  Temp: 98.5 F (36.9 C)  TempSrc: Oral  SpO2: 98%  Weight: 254 lb (115.2 kg)    EKG: RBBB, no acute findings or changes from prior seen.     Assessment & Plan:    Donald [name] is a 48 y.o.  male Essential hypertension - Plan: EKG 12-Lead  - borderline. Continue same meds, monitor home readings, recheck in 2 weeks with home meter if elevated.   Dysuria Urethritis - Plan: azithromycin (ZITHROMAX) 250 MG tablet  -Possible nongonococcal urethritis, trial of azithromycin 1 g x 1, recheck 2 weeks, consider urology eval if persistent symptoms.  Chest wall pain - Plan: EKG 12-Lead  -Atypical symptoms as above with need to take deep breaths every few weeks.  No true dyspnea.  Describes discomfort in the chest wall after taking these deep breaths.  Unlikely cardiac cause.  Denies anxiety symptoms.  No apparent changes on EKG with persistent right bundle branch block  -Recheck next few weeks, consider imaging at that time if recurs, ER/RTC precautions if acute changes.   Meds ordered this encounter  Medications  . azithromycin (ZITHROMAX) 250 MG tablet    Sig: Take 4 tablets (1,000 mg total) by mouth once for 1 dose.    Dispense:  4 tablet    Refill:  0   Patient Instructions   Try to watch sodium in diet. Same meds for now. If over 140/90 at home - follow up with your meter to look at adjusting meds.  Try taking azithromycin for possible urethritis. Follow up in 2 weeks.  If burning symptoms have not improved at that time I can refer you to urology.   Can discuss chest symptoms further in 2 weeks, but does not sound like heart issue at this time.Return to the clinic or go to the nearest emergency room if any of your symptoms worsen or new symptoms occur.   Urethritis, Adult  Urethritis is swelling (inflammation) of the urethra. The urethra is the tube that drains urine from the bladder. It is important to get treatment for this condition  early. Delayed treatment may lead to complications. What are the causes? This condition may be caused by:  Germs that are spread through sexual contact. This is the leading cause of urethritis. This may include bacterial or viral infections.  Injury to the urethra. Injury can happen after a thin, flexible tube (catheter) is inserted into the urethra to drain urine, or after medical instruments or foreign bodies are inserted into the area.  Chemical irritation. This may include contact with spermicide.  A disease that causes inflammation. This is rare. What increases the risk? The following factors may make you more likely to develop this condition:  Having sex without using a condom.  Having multiple sexual partners.  Having poor hygiene. What are the signs or symptoms? Symptoms of this condition include:  Pain with urination.  Frequent urination.  Urgent need to urinate.  Itching and pain in the vagina or penis.  Discharge coming from the penis. However, women rarely have symptoms. How is this diagnosed? This condition is diagnosed based on your medical history and symptoms as well as a physical exam. Tests may also be done. These may include:  Urine tests.  Swabs from the urethra. How is this treated? Treatment for this condition depends on the cause. Urethritis caused by a bacterial infection is treated with antibiotic medicine. Any sexual partners must also be treated. Follow these instructions at home: Medicines  Take over-the-counter and prescription medicines only as told by your health care provider.  If you were prescribed antibiotic medicine, take it as told by your health care provider. Do not stop taking the antibiotic even if you start to feel better. Lifestyle  Avoid using perfumed soaps, bubble bath, and shampoo  when you bathe or shower. Rinse the vaginal area after bathing.  Wear cotton underwear. Not wearing underwear when going to bed can help.   Make sure to wipe from front to back after using the toilet if you are male.  Do not have sex until your health care provider approves. When you do have sex, be sure to practice safe sex.  Tell anyone with whom you have had sexual relations in the past 60 days that he or she may be at risk of infection. General instructions  Drink enough fluid to keep your urine clear or pale yellow.  It is up to you to get your test results. Ask your health care provider, or the department that is doing the test, when your results will be ready.  Keep all follow-up visits as told by your health care provider. This is important.  Get tested again 3 months after treatment to make sure the infection is gone. It is important that your sexual partner also gets tested again. Contact a health care provider if:  Your symptoms have not improved after 3 days.  Your symptoms get worse.  You have eye redness or pain.  You develop abdominal pain or pelvic pain (in females).  You develop joint pain.  You have a fever. Get help right away if:  You have severe pain in the belly, back, or side.  You vomit repeatedly. Summary  Urethritis is a swelling (inflammation) of the urethra.  This condition is caused by germs that are spread through sexual contact. This is the main cause of this illness.  It is important to get treatment for this condition early. Delayed treatment may lead to complications.  Treatment for this condition depends on the cause. Any sexual partners must also be treated. This information is not intended to replace advice given to you by your health care provider. Make sure you discuss any questions you have with your health care provider. Document Released: 11/15/2000 Document Revised: 05/04/2017 Document Reviewed: 06/27/2016 Elsevier Patient Education  [employer].   If you have lab work done today you will be contacted with your lab results within the next 2 weeks.  If you  have not heard from [place] then please contact us. The fastest way to get your results is to register for My Chart.   IF you received an x-ray today, you will receive an invoice from [facility]. Please contact [facility] at [phone] with questions or concerns regarding your invoice.   IF you received labwork today, you will receive an invoice from [place]. Please contact [facility] at [phone] with questions or concerns regarding your invoice.   Our billing staff will not be able to assist you with questions regarding bills from these companies.  You will be contacted with the lab results as soon as they are available. The fastest way to get your results is to activate your My Chart account. Instructions are located on the last page of this paperwork. If you have not heard from [place] regarding the results in 2 weeks, please contact this office.       Signed,   [name], MD [facility].  01/29/19 12:55 PM

## 2019-01-29 NOTE — Patient Instructions (Addendum)
Try to watch sodium in diet. Same meds for now. If over 140/90 at home - follow up with your meter to look at adjusting meds.  Try taking azithromycin for possible urethritis. Follow up in 2 weeks.  If burning symptoms have not improved at that time I can refer you to urology.   Can discuss chest symptoms further in 2 weeks, but does not sound like heart issue at this time.Return to the clinic or go to the nearest emergency room if any of your symptoms worsen or new symptoms occur.   Urethritis, Adult  Urethritis is swelling (inflammation) of the urethra. The urethra is the tube that drains urine from the bladder. It is important to get treatment for this condition early. Delayed treatment may lead to complications. What are the causes? This condition may be caused by:  Germs that are spread through sexual contact. This is the leading cause of urethritis. This may include bacterial or viral infections.  Injury to the urethra. Injury can happen after a thin, flexible tube (catheter) is inserted into the urethra to drain urine, or after medical instruments or foreign bodies are inserted into the area.  Chemical irritation. This may include contact with spermicide.  A disease that causes inflammation. This is rare. What increases the risk? The following factors may make you more likely to develop this condition:  Having sex without using a condom.  Having multiple sexual partners.  Having poor hygiene. What are the signs or symptoms? Symptoms of this condition include:  Pain with urination.  Frequent urination.  Urgent need to urinate.  Itching and pain in the vagina or penis.  Discharge coming from the penis. However, women rarely have symptoms. How is this diagnosed? This condition is diagnosed based on your medical history and symptoms as well as a physical exam. Tests may also be done. These may include:  Urine tests.  Swabs from the urethra. How is this  treated? Treatment for this condition depends on the cause. Urethritis caused by a bacterial infection is treated with antibiotic medicine. Any sexual partners must also be treated. Follow these instructions at home: Medicines  Take over-the-counter and prescription medicines only as told by your health care provider.  If you were prescribed antibiotic medicine, take it as told by your health care provider. Do not stop taking the antibiotic even if you start to feel better. Lifestyle  Avoid using perfumed soaps, bubble bath, and shampoo when you bathe or shower. Rinse the vaginal area after bathing.  Wear cotton underwear. Not wearing underwear when going to bed can help.  Make sure to wipe from front to back after using the toilet if you are male.  Do not have sex until your health care provider approves. When you do have sex, be sure to practice safe sex.  Tell anyone with whom you have had sexual relations in the past 60 days that he or she may be at risk of infection. General instructions  Drink enough fluid to keep your urine clear or pale yellow.  It is up to you to get your test results. Ask your health care provider, or the department that is doing the test, when your results will be ready.  Keep all follow-up visits as told by your health care provider. This is important.  Get tested again 3 months after treatment to make sure the infection is gone. It is important that your sexual partner also gets tested again. Contact a health care provider if:  Your  symptoms have not improved after 3 days.  Your symptoms get worse.  You have eye redness or pain.  You develop abdominal pain or pelvic pain (in females).  You develop joint pain.  You have a fever. Get help right away if:  You have severe pain in the belly, back, or side.  You vomit repeatedly. Summary  Urethritis is a swelling (inflammation) of the urethra.  This condition is caused by germs that are  spread through sexual contact. This is the main cause of this illness.  It is important to get treatment for this condition early. Delayed treatment may lead to complications.  Treatment for this condition depends on the cause. Any sexual partners must also be treated. This information is not intended to replace advice given to you by your health care provider. Make sure you discuss any questions you have with your health care provider. Document Released: 11/15/2000 Document Revised: 05/04/2017 Document Reviewed: 06/27/2016 Elsevier Patient Education  [employer].   If you have lab work done today you will be contacted with your lab results within the next 2 weeks.  If you have not heard from [place] then please contact us. The fastest way to get your results is to register for My Chart.   IF you received an x-ray today, you will receive an invoice from [facility]. Please contact [facility] Radiology at [phone] with questions or concerns regarding your invoice.   IF you received labwork today, you will receive an invoice from [place]. Please contact [facility] at [phone] with questions or concerns regarding your invoice.   Our billing staff will not be able to assist you with questions regarding bills from these companies.  You will be contacted with the lab results as soon as they are available. The fastest way to get your results is to activate your My Chart account. Instructions are located on the last page of this paperwork. If you have not heard from [place] regarding the results in 2 weeks, please contact this office.

## 2019-02-12 ENCOUNTER — Ambulatory Visit: Payer: 59 | Admitting: Family Medicine

## 2019-02-15 ENCOUNTER — Ambulatory Visit: Payer: 59 | Admitting: Family Medicine

## 2019-02-17 ENCOUNTER — Encounter: Payer: Self-pay | Admitting: Family Medicine

## 2019-02-19 ENCOUNTER — Other Ambulatory Visit: Payer: Self-pay

## 2019-02-19 ENCOUNTER — Ambulatory Visit (INDEPENDENT_AMBULATORY_CARE_PROVIDER_SITE_OTHER): Payer: 59

## 2019-02-19 ENCOUNTER — Ambulatory Visit: Payer: 59 | Admitting: Family Medicine

## 2019-02-19 ENCOUNTER — Encounter: Payer: Self-pay | Admitting: Family Medicine

## 2019-02-19 VITALS — BP 120/84 | HR 73 | Temp 98.7°F | Resp 14 | Wt 261.0 lb

## 2019-02-19 DIAGNOSIS — R0609 Other forms of dyspnea: Secondary | ICD-10-CM | POA: Diagnosis not present

## 2019-02-19 DIAGNOSIS — N342 Other urethritis: Secondary | ICD-10-CM | POA: Diagnosis not present

## 2019-02-19 DIAGNOSIS — R0789 Other chest pain: Secondary | ICD-10-CM

## 2019-02-19 NOTE — Patient Instructions (Addendum)
I will check a chest x-ray and let you know if there are concerns, but your exam is reassuring at this time.  I will also refer you to cardiology to discuss the symptoms further and decide on other testing.  Return to the clinic or go to the nearest emergency room if any of your symptoms worsen or new symptoms occur.   If you have lab work done today you will be contacted with your lab results within the next 2 weeks.  If you have not heard from [place] then please contact us. The fastest way to get your results is to register for My Chart.   IF you received an x-ray today, you will receive an invoice from [facility]. Please contact [facility] at [phone] with questions or concerns regarding your invoice.   IF you received labwork today, you will receive an invoice from [place]. Please contact [facility] at [phone] with questions or concerns regarding your invoice.   Our billing staff will not be able to assist you with questions regarding bills from these companies.  You will be contacted with the lab results as soon as they are available. The fastest way to get your results is to activate your My Chart account. Instructions are located on the last page of this paperwork. If you have not heard from [place] regarding the results in 2 weeks, please contact this office.

## 2019-02-19 NOTE — Progress Notes (Signed)
Subjective:    Patient ID: Donald [name], male    DOB: 01/05/1971, 48 y.o.   MRN: [id number]  HPI Donald [name] is a 48 y.o. male Presents today for: Chief Complaint  Patient presents with  . urethritis    follow up. Not ghaving any pain at this time  . Shortness of Breath    follow up. Can not take deep breathes   Urethritis/dysuria Treated for nongonococcal urethritis on August 25 with azithromycin 1 g x 1. Feeling much better. Urinating ok now, no penile discharge.   Dyspnea/chest wall pain Discussed August 26.  Atypical symptoms with the need to take breaths every few weeks.  No true dyspnea at that time.  Did have some discomfort in the chest with taking deep breaths.  No concerning findings on EKG with persistent right bundle branch block.  Still feeling sensation every few weeks. Not daily, no current symptom. Last felt last week. At rest. Has feeling of need to take a deep breath, but when tries to take breath, unable to take deep breath. Notices soreness in chest AFTER tring to take a breath for awhile.  No calf pain/swelling.  No arm pain/diaphoresis/nausea/vomiting.  Denies anxiety/depression. Not feeling stressed.  Sx's every 2 weeks.  No on statin since 8/22.   Patient Active Problem List   Diagnosis Date Noted  . Lower urinary tract symptoms (LUTS) 04/19/2018  . Foamy urine 04/19/2018  . Chest pain 01/20/2016  . Acute URI 01/20/2016  . Hypokalemia 01/20/2016  . Right bundle branch block 03/11/2010  . LUMBAGO 03/09/2010  . DYSURIA 03/09/2010  . History of kidney stones 12/10/2009  . Essential hypertension 05/14/2009  . PARASOMNIA 05/14/2009  . Hyperlipidemia 05/13/2009  . G E R D 05/13/2009   Past Medical History:  Diagnosis Date  . DEPRESSION, MILD   . Dysuria   . G E R D   . Hypercholesterolemia   . Hyperlipidemia    NOT TAKING MEDICATION  . Hypertension   . Lumbago   . NEPHROLITHIASIS, HX OF   . PARASOMNIA   . Right bundle branch block     Past Surgical History:  Procedure Laterality Date  . CARDIAC CATHETERIZATION N/A 01/19/2016   Procedure: Left Heart Cath and Coronary Angiography;  Surgeon: Sherren Mocha, MD;  Location: [facility];  Service: Cardiovascular;  Laterality: N/A;  . HAND SURGERY    . right and left hand     Allergies  Allergen Reactions  . Ace Inhibitors Cough    Lisinopril caused cough   Prior to Admission medications   Medication Sig Start Date End Date Taking? Authorizing Provider  amLODipine (NORVASC) 10 MG tablet Take 1 tablet (10 mg total) by mouth daily. 01/15/19  Yes [name], MD  aspirin 81 MG tablet Take 1 tablet (81 mg total) by mouth daily. 10/29/15  Yes Jaynee Eagles, PA-C  atorvastatin (LIPITOR) 10 MG tablet Take 1 tablet (10 mg total) by mouth daily. 01/25/19  Yes [name], MD  fluticasone (FLONASE) 50 MCG/ACT nasal spray Place 1 spray into both nostrils daily. 01/15/19  Yes [name], MD  losartan-hydrochlorothiazide (HYZAAR) 100-25 MG tablet Take 1 tablet by mouth daily. 01/15/19  Yes [name], MD   Social History   Socioeconomic History  . Marital status: Married    Spouse name: Not on file  . Number of children: 2  . Years of education: 29  . Highest education level: Not on file  Occupational History  .  Occupation: IT sales professional: [employer]  Social Needs  . Financial resource strain: Not on file  . Food insecurity    Worry: Not on file    Inability: Not on file  . Transportation needs    Medical: Not on file    Non-medical: Not on file  Tobacco Use  . Smoking status: Never Smoker  . Smokeless tobacco: Never Used  Substance and Sexual Activity  . Alcohol use: No    Alcohol/week: 0.0 standard drinks  . Drug use: No  . Sexual activity: Not on file  Lifestyle  . Physical activity    Days per week: Not on file    Minutes per session: Not on file  . Stress: Not on file  Relationships  . Social Product manager on phone: Not on file    Gets together: Not on file    Attends religious service: Not on file    Active member of club or organization: Not on file    Attends meetings of clubs or organizations: Not on file    Relationship status: Not on file  . Intimate partner violence    Fear of current or ex partner: Not on file    Emotionally abused: Not on file    Physically abused: Not on file    Forced sexual activity: Not on file  Other Topics Concern  . Not on file  Social History Narrative   ** Merged History Encounter **        Review of Systems Per HPI.     Objective:   Physical Exam Vitals signs reviewed.  Constitutional:      Appearance: He is well-developed.  HENT:     Head: Normocephalic and atraumatic.  Eyes:     Pupils: Pupils are equal, round, and reactive to light.  Neck:     Vascular: No carotid bruit or JVD.  Cardiovascular:     Rate and Rhythm: Normal rate and regular rhythm.     Heart sounds: Normal heart sounds. No murmur.  Pulmonary:     Effort: Pulmonary effort is normal.     Breath sounds: Normal breath sounds. No rales.  Skin:    General: Skin is warm and dry.  Neurological:     Mental Status: He is alert and oriented to person, place, and time.    Vitals:   02/19/19 1426  BP: 125/77  Pulse: 73  Resp: 14  Temp: 98.7 F (37.1 C)  TempSrc: Oral  SpO2: 99%  Weight: 261 lb (118.4 kg)      Dg Chest 2 View  Result Date: 02/19/2019 CLINICAL DATA:  Dyspnea every few weeks. EXAM: CHEST - 2 VIEW COMPARISON:  Single-view of the chest 09/24/2018. PA and lateral chest 12/02/2012. FINDINGS: The lungs are clear. Heart size is normal. There is no pneumothorax or pleural fluid. No bony abnormality. IMPRESSION: Normal chest. Electronically Signed   By: Inge Rise M.D.   On: 02/19/2019 15:35       Assessment & Plan:  Donald [name] is a 48 y.o. male Atypical chest pain - Plan: Ambulatory referral to Cardiology Other form of dyspnea - Plan: DG  Chest 2 View, Ambulatory referral to Cardiology  -Fleeting/atypical symptoms.  Chest x-ray without concerns.  Less likely cardiac cause but does have risk factors of hypertension and hyperlipidemia.  Right bundle branch block noted on prior EKG.  Will refer to cardiology for evaluation and decision on further testing, ER/RTC precautions  if acute worsening.  Urethritis  -Improved post completion of antibiotic.  RTC precautions if recurs.  No orders of the defined types were placed in this encounter.  Patient Instructions   I will check a chest x-ray and let you know if there are concerns, but your exam is reassuring at this time.  I will also refer you to cardiology to discuss the symptoms further and decide on other testing.  Return to the clinic or go to the nearest emergency room if any of your symptoms worsen or new symptoms occur.   If you have lab work done today you will be contacted with your lab results within the next 2 weeks.  If you have not heard from [place] then please contact us. The fastest way to get your results is to register for My Chart.   IF you received an x-ray today, you will receive an invoice from [facility]. Please contact [facility] at [phone] with questions or concerns regarding your invoice.   IF you received labwork today, you will receive an invoice from [facility]. Please contact [facility] at [phone] with questions or concerns regarding your invoice.   Our billing staff will not be able to assist you with questions regarding bills from these companies.  You will be contacted with the lab results as soon as they are available. The fastest way to get your results is to activate your My Chart account. Instructions are located on the last page of this paperwork. If you have not heard from [place] regarding the results in 2 weeks, please contact this office.       Signed,   [name], MD [facility].  02/19/19 7:15 PM

## 2019-04-27 ENCOUNTER — Other Ambulatory Visit: Payer: Self-pay | Admitting: Family Medicine

## 2019-05-15 ENCOUNTER — Ambulatory Visit: Payer: 59 | Admitting: Cardiovascular Disease

## 2019-06-18 ENCOUNTER — Other Ambulatory Visit: Payer: Self-pay

## 2019-06-18 ENCOUNTER — Ambulatory Visit (HOSPITAL_COMMUNITY)
Admission: RE | Admit: 2019-06-18 | Discharge: 2019-06-18 | Disposition: A | Discharge status: Home / Self Care | Payer: 59 | Source: Ambulatory Visit | Attending: Family Medicine | Admitting: Family Medicine

## 2019-06-18 ENCOUNTER — Encounter: Payer: Self-pay | Admitting: Family Medicine

## 2019-06-18 ENCOUNTER — Ambulatory Visit: Payer: 59 | Admitting: Family Medicine

## 2019-06-18 VITALS — BP 130/72 | HR 72 | Temp 98.0°F | Ht 73.0 in | Wt 258.8 lb

## 2019-06-18 DIAGNOSIS — R1011 Right upper quadrant pain: Secondary | ICD-10-CM | POA: Diagnosis not present

## 2019-06-18 DIAGNOSIS — M25512 Pain in left shoulder: Secondary | ICD-10-CM

## 2019-06-18 DIAGNOSIS — M25511 Pain in right shoulder: Secondary | ICD-10-CM | POA: Diagnosis not present

## 2019-06-18 DIAGNOSIS — M791 Myalgia, unspecified site: Secondary | ICD-10-CM

## 2019-06-18 DIAGNOSIS — R82998 Other abnormal findings in urine: Secondary | ICD-10-CM

## 2019-06-18 LAB — POCT URINALYSIS DIP (MANUAL ENTRY)
Bilirubin, UA: NEGATIVE
Blood, UA: NEGATIVE
Glucose, UA: NEGATIVE mg/dL
Ketones, POC UA: NEGATIVE mg/dL
Leukocytes, UA: NEGATIVE
Nitrite, UA: NEGATIVE
Protein Ur, POC: NEGATIVE mg/dL
Spec Grav, UA: 1.025 (ref 1.010–1.025)
Urobilinogen, UA: 0.2 E.U./dL
pH, UA: 8.5 — AB (ref 5.0–8.0)

## 2019-06-18 LAB — POCT CBC
Granulocyte percent: 39.9 %G (ref 37–80)
HCT, POC: 41.9 % — AB (ref 29–41)
Hemoglobin: 14.2 g/dL (ref 11–14.6)
Lymph, poc: 2.8 (ref 0.6–3.4)
MCH, POC: 28.3 pg (ref 27–31.2)
MCHC: 34 g/dL (ref 31.8–35.4)
MCV: 83.2 fL (ref 76–111)
MID (cbc): 0.5 (ref 0–0.9)
MPV: 7.3 fL (ref 0–99.8)
POC Granulocyte: 2.2 (ref 2–6.9)
POC LYMPH PERCENT: 51.2 %L — AB (ref 10–50)
POC MID %: 8.9 %M (ref 0–12)
Platelet Count, POC: 328 10*3/uL (ref 142–424)
RBC: 5.04 M/uL (ref 4.69–6.13)
RDW, POC: 14.3 %
WBC: 5.4 10*3/uL (ref 4.6–10.2)

## 2019-06-18 MED ORDER — CYCLOBENZAPRINE HCL 5 MG PO TABS: 5.0000 mg | ORAL_TABLET | Freq: Three times a day (TID) | ORAL | 1 refills | Status: DC | PRN

## 2019-06-18 NOTE — Patient Instructions (Addendum)
For the shoulder pain, I suspect some of that is spasm of the muscles, trapezius spasm.  Okay to try low-dose muscle relaxant at bedtime, or up to 3 times per day but that can cause sedation.  Do not drive or operate machinery while taking the medication.  Heat or ice to affected area, gentle range of motion and stretches okay throughout the day.  Recheck in the next 1 to 2 weeks if not improving, sooner if worse.  I will check some labs for your abdominal pain as well as an ultrasound to evaluate the gallbladder.  If any fevers, nausea vomiting or acute worsening of pain to the emergency room.   Muscle Cramps and Spasms Muscle cramps and spasms occur when a muscle or muscles tighten and you have no control over this tightening (involuntary muscle contraction). They are a common problem and can develop in any muscle. The most common place is in the calf muscles of the leg. Muscle cramps and muscle spasms are both involuntary muscle contractions, but there are some differences between the two:  Muscle cramps are painful. They come and go and may last for a few seconds or up to 15 minutes. Muscle cramps are often more forceful and last longer than muscle spasms.  Muscle spasms may or may not be painful. They may also last just a few seconds or much longer. Certain medical conditions, such as diabetes or Parkinson's disease, can make it more likely to develop cramps or spasms. However, cramps or spasms are usually not caused by a serious underlying problem. Common causes include:  Doing more physical work or exercise than your body is ready for (overexertion).  Overuse from repeating certain movements too many times.  Remaining in a certain position for a long period of time.  Improper preparation, form, or technique while playing a sport or doing an activity.  Dehydration.  Injury.  Side effects of some medicines.  Abnormally low levels of the salts and minerals in your blood  (electrolytes), especially potassium and calcium. This could happen if you are taking water pills (diuretics) or if you are pregnant. In many cases, the cause of muscle cramps or spasms is not known. Follow these instructions at home: Managing pain and stiffness      Try massaging, stretching, and relaxing the affected muscle. Do this for several minutes at a time.  If directed, apply heat to tight or tense muscles as often as told by your health care provider. Use the heat source that your health care provider recommends, such as a moist heat pack or a heating pad. ? Place a towel between your skin and the heat source. ? Leave the heat on for 20-30 minutes. ? Remove the heat if your skin turns bright red. This is especially important if you are unable to feel pain, heat, or cold. You may have a greater risk of getting burned.  If directed, put ice on the affected area. This may help if you are sore or have pain after a cramp or spasm. ? Put ice in a plastic bag. ? Place a towel between your skin and the bag. ? Leavethe ice on for 20 minutes, 2-3 times a day.  Try taking hot showers or baths to help relax tight muscles. Eating and drinking  Drink enough fluid to keep your urine pale yellow. Staying well hydrated may help prevent cramps or spasms.  Eat a healthy diet that includes plenty of nutrients to help your muscles function. A healthy  diet includes fruits and vegetables, lean protein, whole grains, and low-fat or nonfat dairy products. General instructions  If you are having frequent cramps, avoid intense exercise for several days.  Take over-the-counter and prescription medicines only as told by your health care provider.  Pay attention to any changes in your symptoms.  Keep all follow-up visits as told by your health care provider. This is important. Contact a health care provider if:  Your cramps or spasms get more severe or happen more often.  Your cramps or spasms do  not improve over time. Summary  Muscle cramps and spasms occur when a muscle or muscles tighten and you have no control over this tightening (involuntary muscle contraction).  The most common place for cramps or spasms to occur is in the calf muscles of the leg.  Massaging, stretching, and relaxing the affected muscle may relieve the cramp or spasm.  Drink enough fluid to keep your urine pale yellow. Staying well hydrated may help prevent cramps or spasms. This information is not intended to replace advice given to you by your health care provider. Make sure you discuss any questions you have with your health care provider. Document Revised: 10/15/2017 Document Reviewed: 10/15/2017 Elsevier Patient Education  [employer].   If you have lab work done today you will be contacted with your lab results within the next 2 weeks.  If you have not heard from [place] then please contact us. The fastest way to get your results is to register for My Chart.   IF you received an x-ray today, you will receive an invoice from [facility]. Please contact [facility] at [phone] with questions or concerns regarding your invoice.   IF you received labwork today, you will receive an invoice from [facility]. Please contact [facility] at [phone] with questions or concerns regarding your invoice.   Our billing staff will not be able to assist you with questions regarding bills from these companies.  You will be contacted with the lab results as soon as they are available. The fastest way to get your results is to activate your My Chart account. Instructions are located on the last page of this paperwork. If you have not heard from [place] regarding the results in 2 weeks, please contact this office.

## 2019-06-18 NOTE — Progress Notes (Signed)
Subjective:  Patient ID: Donald [name], male    DOB: February 05, 1971  Age: 49 y.o. MRN: [id number]  CC:  Chief Complaint  Patient presents with  . Shoulder Pain    both shoulders are in pain, but the L one is wosre states the pt. started a week ago. no obvious triggers. pain is sharp pain is constant, but changes in intensity.  . Abdominal Pain    pain is in pt's upper Right side of his abdomin.started a week ago. pain is sharp, and constant.   English  spoken with understanding expressed, all questions answered.  HPI Donald [name] presents for   Bilateral shoulder pain Left greater than right.  Past few weeks. Sharp pain. Top of shoulder area. Sore to hold arms up for awhile. Pain into upper shoulder blades, no chest pain.  No dyspnea.  No known injury. No new activity/exercise/work.  Sore to lie in side. Sleeping on back.    Tx: ibuprofen, tylenol - up to 3 times per day - min relief.   Abdominal pain Right upper stomach. Past 4 days.  None in chest.  No n/v.  No fever.  No cough/dyspnea.  Pain radiates to back at times.  No abdominal surgeries.  No constipation, no urinary issues. Sometimes darker urine, but urinating ok.  Tx: none.    History Patient Active Problem List   Diagnosis Date Noted  . Lower urinary tract symptoms (LUTS) 04/19/2018  . Foamy urine 04/19/2018  . Chest pain 01/20/2016  . Acute URI 01/20/2016  . Hypokalemia 01/20/2016  . Right bundle branch block 03/11/2010  . LUMBAGO 03/09/2010  . DYSURIA 03/09/2010  . History of kidney stones 12/10/2009  . Essential hypertension 05/14/2009  . PARASOMNIA 05/14/2009  . Hyperlipidemia 05/13/2009  . G E R D 05/13/2009   Past Medical History:  Diagnosis Date  . DEPRESSION, MILD   . Dysuria   . G E R D   . Hypercholesterolemia   . Hyperlipidemia    NOT TAKING MEDICATION  . Hypertension   . Lumbago   . NEPHROLITHIASIS, HX OF   . PARASOMNIA   . Right bundle branch block    Past Surgical  History:  Procedure Laterality Date  . CARDIAC CATHETERIZATION N/A 01/19/2016   Procedure: Left Heart Cath and Coronary Angiography;  Surgeon: Sherren Mocha, MD;  Location: [place] [facility];  Service: Cardiovascular;  Laterality: N/A;  . HAND SURGERY    . right and left hand     Allergies  Allergen Reactions  . Ace Inhibitors Cough    Lisinopril caused cough   Prior to Admission medications   Medication Sig Start Date End Date Taking? Authorizing Provider  amLODipine (NORVASC) 10 MG tablet Take 1 tablet (10 mg total) by mouth daily. 01/15/19  Yes [name], MD  aspirin 81 MG tablet Take 1 tablet (81 mg total) by mouth daily. 10/29/15  Yes Jaynee Eagles, PA-C  atorvastatin (LIPITOR) 10 MG tablet TAKE 1 TABLET BY MOUTH EVERY DAY 04/28/19  Yes [name], MD  fluticasone [facility]) 50 MCG/ACT nasal spray Place 1 spray into both nostrils daily. 01/15/19  Yes [name], MD  losartan-hydrochlorothiazide (HYZAAR) 100-25 MG tablet Take 1 tablet by mouth daily. 01/15/19  Yes [name], MD   Social History   Socioeconomic History  . Marital status: Married    Spouse name: Not on file  . Number of children: 2  . Years of education: 41  . Highest education level:  Not on file  Occupational History  . Occupation: Glass blower/designer    Employer: [employer]  Tobacco Use  . Smoking status: Never Smoker  . Smokeless tobacco: Never Used  Substance and Sexual Activity  . Alcohol use: No    Alcohol/week: 0.0 standard drinks  . Drug use: No  . Sexual activity: Not on file  Other Topics Concern  . Not on file  Social History Narrative   ** Merged History Encounter **       Social Determinants of Health   Financial Resource Strain:   . Difficulty of Paying Living Expenses: Not on file  Food Insecurity:   . Worried About Charity fundraiser in the Last Year: Not on file  . Ran Out of Food in the Last Year: Not on file  Transportation Needs:   . Lack of  Transportation (Medical): Not on file  . Lack of Transportation (Non-Medical): Not on file  Physical Activity:   . Days of Exercise per Week: Not on file  . Minutes of Exercise per Session: Not on file  Stress:   . Feeling of Stress : Not on file  Social Connections:   . Frequency of Communication with Friends and Family: Not on file  . Frequency of Social Gatherings with Friends and Family: Not on file  . Attends Religious Services: Not on file  . Active Member of Clubs or Organizations: Not on file  . Attends Archivist Meetings: Not on file  . Marital Status: Not on file  Intimate Partner Violence:   . Fear of Current or Ex-Partner: Not on file  . Emotionally Abused: Not on file  . Physically Abused: Not on file  . Sexually Abused: Not on file    Review of Systems   Objective:   Vitals:   06/18/19 0841 06/18/19 0844  BP: (!) 154/89 130/72  Pulse: 72   Temp: 98 F (36.7 C)   TempSrc: Temporal   SpO2: 97%   Weight: 258 lb 12.8 oz (117.4 kg)   Height: 6\' 1"  (1.854 m)      Physical Exam Vitals reviewed.  Constitutional:      General: He is not in acute distress.    Appearance: Normal appearance. He is well-developed. He is not ill-appearing or toxic-appearing.  HENT:     Head: Normocephalic and atraumatic.  Cardiovascular:     Rate and Rhythm: Normal rate.  Pulmonary:     Effort: Pulmonary effort is normal.  Abdominal:     General: Abdomen is flat. Bowel sounds are normal.     Palpations: Abdomen is soft. There is no hepatomegaly.     Tenderness: There is abdominal tenderness in the right upper quadrant. There is no right CVA tenderness, left CVA tenderness or guarding. Negative signs include Murphy's sign and McBurney's sign.     Comments: Normal quadrant tenderness but no rebound/guarding, negative Murphy's.  Musculoskeletal:     Right shoulder: No swelling or bony tenderness. Normal range of motion. Normal strength.     Left shoulder: No swelling  or bony tenderness. Normal range of motion. Normal strength.       Arms:  Neurological:     Mental Status: He is alert and oriented to person, place, and time.     Results for orders placed or performed in visit on 06/18/19  POCT CBC  Result Value Ref Range   WBC 5.4 4.6 - 10.2 K/uL   Lymph, poc 2.8 0.6 - 3.4  POC LYMPH PERCENT 51.2 (A) 10 - 50 %L   MID (cbc) 0.5 0 - 0.9   POC MID % 8.9 0 - 12 %M   POC Granulocyte 2.2 2 - 6.9   Granulocyte percent 39.9 37 - 80 %G   RBC 5.04 4.69 - 6.13 M/uL   Hemoglobin 14.2 11 - 14.6 g/dL   HCT, POC 41.9 (A) 29 - 41 %   MCV 83.2 76 - 111 fL   MCH, POC 28.3 27 - 31.2 pg   MCHC 34.0 31.8 - 35.4 g/dL   RDW, POC 14.3 %   Platelet Count, POC 328 142 - 424 K/uL   MPV 7.3 0 - 99.8 fL  POCT urinalysis dipstick  Result Value Ref Range   Color, UA yellow yellow   Clarity, UA clear clear   Glucose, UA negative negative mg/dL   Bilirubin, UA negative negative   Ketones, POC UA negative negative mg/dL   Spec Grav, UA 1.025 1.010 - 1.025   Blood, UA negative negative   pH, UA 8.5 (A) 5.0 - 8.0   Protein Ur, POC negative negative mg/dL   Urobilinogen, UA 0.2 0.2 or 1.0 E.U./dL   Nitrite, UA Negative Negative   Leukocytes, UA Negative Negative      Assessment & Plan:  Donald [name] is a 49 y.o. male . Acute pain of both shoulders - Plan: cyclobenzaprine (FLEXERIL) 5 MG tablet Myalgia - Plan: CK  -On statin.  Check CPK, less likely cause.  Suspected trapezius spasm, possible overuse component.  Denies any new activity.  -Symptomatic care with heat, ice, range of motion and stretches discussed.  Flexeril temporarily with recheck in the next 2 weeks, sooner if worse.  RUQ abdominal pain - Plan: Comprehensive metabolic panel, POCT CBC, US Abdomen Limited RUQ Dark urine - Plan: POCT urinalysis dipstick  -Check CBC, CMP to evaluate LFTs, ultrasound to evaluate for possible cholelithiasis.  Afebrile, negative Murphy's at this time.  Less likely  acute cholecystitis.  ER precautions reviewed.   Meds ordered this encounter  Medications  . cyclobenzaprine (FLEXERIL) 5 MG tablet    Sig: Take 1 tablet (5 mg total) by mouth 3 (three) times daily as needed for muscle spasms (start qhs prn due to sedation).    Dispense:  15 tablet    Refill:  1   Patient Instructions    For the shoulder pain, I suspect some of that is spasm of the muscles, trapezius spasm.  Okay to try low-dose muscle relaxant at bedtime, or up to 3 times per day but that can cause sedation.  Do not drive or operate machinery while taking the medication.  Heat or ice to affected area, gentle range of motion and stretches okay throughout the day.  Recheck in the next 1 to 2 weeks if not improving, sooner if worse.  I will check some labs for your abdominal pain as well as an ultrasound to evaluate the gallbladder.  If any fevers, nausea vomiting or acute worsening of pain to the emergency room.   Muscle Cramps and Spasms Muscle cramps and spasms occur when a muscle or muscles tighten and you have no control over this tightening (involuntary muscle contraction). They are a common problem and can develop in any muscle. The most common place is in the calf muscles of the leg. Muscle cramps and muscle spasms are both involuntary muscle contractions, but there are some differences between the two:  Muscle cramps are painful. They come and go and  may last for a few seconds or up to 15 minutes. Muscle cramps are often more forceful and last longer than muscle spasms.  Muscle spasms may or may not be painful. They may also last just a few seconds or much longer. Certain medical conditions, such as diabetes or Parkinson's disease, can make it more likely to develop cramps or spasms. However, cramps or spasms are usually not caused by a serious underlying problem. Common causes include:  Doing more physical work or exercise than your body is ready for (overexertion).  Overuse from  repeating certain movements too many times.  Remaining in a certain position for a long period of time.  Improper preparation, form, or technique while playing a sport or doing an activity.  Dehydration.  Injury.  Side effects of some medicines.  Abnormally low levels of the salts and minerals in your blood (electrolytes), especially potassium and calcium. This could happen if you are taking water pills (diuretics) or if you are pregnant. In many cases, the cause of muscle cramps or spasms is not known. Follow these instructions at home: Managing pain and stiffness      Try massaging, stretching, and relaxing the affected muscle. Do this for several minutes at a time.  If directed, apply heat to tight or tense muscles as often as told by your health care provider. Use the heat source that your health care provider recommends, such as a moist heat pack or a heating pad. ? Place a towel between your skin and the heat source. ? Leave the heat on for 20-30 minutes. ? Remove the heat if your skin turns bright red. This is especially important if you are unable to feel pain, heat, or cold. You may have a greater risk of getting burned.  If directed, put ice on the affected area. This may help if you are sore or have pain after a cramp or spasm. ? Put ice in a plastic bag. ? Place a towel between your skin and the bag. ? Leavethe ice on for 20 minutes, 2-3 times a day.  Try taking hot showers or baths to help relax tight muscles. Eating and drinking  Drink enough fluid to keep your urine pale yellow. Staying well hydrated may help prevent cramps or spasms.  Eat a healthy diet that includes plenty of nutrients to help your muscles function. A healthy diet includes fruits and vegetables, lean protein, whole grains, and low-fat or nonfat dairy products. General instructions  If you are having frequent cramps, avoid intense exercise for several days.  Take over-the-counter and  prescription medicines only as told by your health care provider.  Pay attention to any changes in your symptoms.  Keep all follow-up visits as told by your health care provider. This is important. Contact a health care provider if:  Your cramps or spasms get more severe or happen more often.  Your cramps or spasms do not improve over time. Summary  Muscle cramps and spasms occur when a muscle or muscles tighten and you have no control over this tightening (involuntary muscle contraction).  The most common place for cramps or spasms to occur is in the calf muscles of the leg.  Massaging, stretching, and relaxing the affected muscle may relieve the cramp or spasm.  Drink enough fluid to keep your urine pale yellow. Staying well hydrated may help prevent cramps or spasms. This information is not intended to replace advice given to you by your health care provider. Make sure you discuss  any questions you have with your health care provider. Document Revised: 10/15/2017 Document Reviewed: 10/15/2017 Elsevier Patient Education  [employer].   If you have lab work done today you will be contacted with your lab results within the next 2 weeks.  If you have not heard from [place] then please contact us. The fastest way to get your results is to register for My Chart.   IF you received an x-ray today, you will receive an invoice from [facility]. Please contact [facility] at [phone] with questions or concerns regarding your invoice.   IF you received labwork today, you will receive an invoice from [facility]. Please contact [facility] at [phone] with questions or concerns regarding your invoice.   Our billing staff will not be able to assist you with questions regarding bills from these companies.  You will be contacted with the lab results as soon as they are available. The fastest way to get your results is to activate your My Chart account. Instructions are  located on the last page of this paperwork. If you have not heard from [place] regarding the results in 2 weeks, please contact this office.         Signed, [name], MD [facility]

## 2019-06-19 LAB — COMPREHENSIVE METABOLIC PANEL
ALT: 16 IU/L (ref 0–44)
AST: 19 IU/L (ref 0–40)
Albumin/Globulin Ratio: 1.5 (ref 1.2–2.2)
Albumin: 4.5 g/dL (ref 4.0–5.0)
Alkaline Phosphatase: 64 IU/L (ref 39–117)
BUN/Creatinine Ratio: 11 (ref 9–20)
BUN: 11 mg/dL (ref 6–24)
Bilirubin Total: 0.7 mg/dL (ref 0.0–1.2)
CO2: 22 mmol/L (ref 20–29)
Calcium: 9.4 mg/dL (ref 8.7–10.2)
Chloride: 102 mmol/L (ref 96–106)
Creatinine, Ser: 0.97 mg/dL (ref 0.76–1.27)
GFR calc Af Amer: 105 mL/min/{1.73_m2} (ref >59)
GFR calc non Af Amer: 91 mL/min/{1.73_m2} (ref >59)
Globulin, Total: 3.1 g/dL (ref 1.5–4.5)
Glucose: 95 mg/dL (ref 65–99)
Potassium: 4.1 mmol/L (ref 3.5–5.2)
Sodium: 141 mmol/L (ref 134–144)
Total Protein: 7.6 g/dL (ref 6.0–8.5)

## 2019-06-19 LAB — CK: Total CK: 191 U/L (ref 49–439)

## 2019-06-29 ENCOUNTER — Other Ambulatory Visit: Payer: Self-pay

## 2019-06-30 NOTE — Progress Notes (Signed)
CARDIOLOGY CONSULT NOTE       Patient ID: Donald [name] MRN: [id number] DOB/AGE: 51-Jan-1972 51 y.o.  Admit date: (Not on file) Referring Physician: Carlota Raspberry Primary Physician: [name], MD Primary Cardiologist: New Reason for Consultation: Chest pain   Active Problems:   * No active hospital problems. *   HPI:  49 y.o. history of depression, GERD, HLD, HTN and chronic RBBB. Referred by Dr Carlota Raspberry for atypical chest pain. No history of vascular disease or CAD. Primary note from 06/18/19 indicates mostly shoulder pains worse over last 2 weeks Pain is sharp and constant Associated with abdomina pain in RUQ ? 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 EF was normal Cath uncomplicated and done from right radial approach   Pain partially improved with NSAI's and flexeril. Some pleuritic component. Does note some more edema and pain in LLE Has ACE wrap around knee.   He is from [place]. 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 DEPRESSION, MILD   . Dysuria   . G E R D   . Hypercholesterolemia   . Hyperlipidemia    NOT TAKING MEDICATION  . Hypertension   . Lumbago   . NEPHROLITHIASIS, HX OF   . PARASOMNIA   . Right bundle branch block     Family History  Problem Relation Age of Onset  . Hypertension Mother   . Hypertension Sister     Social History   Socioeconomic History  . Marital status: Married    Spouse name: Not on file  . Number of children: 2  . Years of education: 39  . Highest education level: Not on file  Occupational History  . Occupation: Glass blower/designer    Employer: [employer]  Tobacco Use  . Smoking status: Never Smoker  . Smokeless tobacco:  Never Used  Substance and Sexual Activity  . Alcohol use: No    Alcohol/week: 0.0 standard drinks  . Drug use: No  . Sexual activity: Not on file  Other Topics Concern  . Not on file  Social History Narrative   ** Merged History Encounter **       Social Determinants of Health   Financial Resource Strain:   . Difficulty of Paying Living Expenses: Not on file  Food Insecurity:   . Worried About Charity fundraiser in the Last Year: Not on file  . Ran Out of Food in the Last Year: Not on file  Transportation Needs:   . Lack of Transportation (Medical): Not on file  . Lack of Transportation (Non-Medical): Not on file  Physical Activity:   . Days of Exercise per Week: Not on file  . Minutes of Exercise per Session: Not on file  Stress:   . Feeling of Stress : Not on file  Social Connections:   . Frequency of Communication with Friends and Family: Not on file  . Frequency of Social Gatherings with Friends and Family: Not on file  . Attends Religious Services: Not on file  . Active Member of Clubs or Organizations: Not on file  . Attends Archivist Meetings: Not on file  . Marital Status: Not  on file  Intimate Partner Violence:   . Fear of Current or Ex-Partner: Not on file  . Emotionally Abused: Not on file  . Physically Abused: Not on file  . Sexually Abused: Not on file    Past Surgical History:  Procedure Laterality Date  . CARDIAC CATHETERIZATION N/A 01/19/2016   Procedure: Left Heart Cath and Coronary Angiography;  Surgeon: Sherren Mocha, MD;  Location: [place] [facility];  Service: Cardiovascular;  Laterality: N/A;  . HAND SURGERY    . right and left hand        Current Outpatient Medications:  .  amLODipine (NORVASC) 10 MG tablet, Take 1 tablet (10 mg total) by mouth daily., Disp: 90 tablet, Rfl: 1 .  aspirin 81 MG tablet, Take 1 tablet (81 mg total) by mouth daily., Disp: 30 tablet, Rfl: 5 .  atorvastatin (LIPITOR) 10 MG tablet, TAKE 1 TABLET BY MOUTH  EVERY DAY, Disp: 90 tablet, Rfl: 0 .  cyclobenzaprine (FLEXERIL) 5 MG tablet, Take 1 tablet (5 mg total) by mouth 3 (three) times daily as needed for muscle spasms (start qhs prn due to sedation)., Disp: 15 tablet, Rfl: 1 .  fluticasone (FLONASE) 50 MCG/ACT nasal spray, Place 1 spray into both nostrils daily., Disp: 16 g, Rfl: 6 .  losartan-hydrochlorothiazide (HYZAAR) 100-25 MG tablet, Take 1 tablet by mouth daily., Disp: 90 tablet, Rfl: 1 .  meloxicam (MOBIC) 7.5 MG tablet, Take 1 tablet (7.5 mg total) by mouth daily., Disp: 30 tablet, Rfl: 0    Physical Exam: Blood pressure 122/68, pulse 79, height 6\' 1"  (1.854 m), weight 266 lb (120.7 kg), SpO2 98 %.    Affect appropriate Healthy:  appears stated age 49: normal Neck supple with no adenopathy JVP normal no bruits no thyromegaly Lungs clear with no wheezing and good diaphragmatic motion Heart:  S1/S2 no murmur, no rub, gallop or click PMI normal Abdomen: benighn, BS positve, no tenderness, no AAA no bruit.  No HSM or HJR Distal pulses intact with no bruits No edema Neuro non-focal Skin warm and dry No muscular weakness   Labs:   Lab Results  Component Value Date   WBC 5.4 06/18/2019   HGB 14.2 06/18/2019   HCT 41.9 (A) 06/18/2019   MCV 83.2 06/18/2019   PLT 341 09/23/2018   No results for input(s): NA, K, CL, CO2, BUN, CREATININE, CALCIUM, PROT, BILITOT, ALKPHOS, ALT, AST, GLUCOSE in the last 168 hours.  Invalid input(s): LABALBU Lab Results  Component Value Date   CKTOTAL 191 06/18/2019   CKMB 3.6 01/19/2016   TROPONINI <0.03 09/23/2018    Lab Results  Component Value Date   CHOL 220 (H) 01/15/2019   CHOL 204 (H) 02/28/2018   CHOL 226 (H) 08/09/2017   Lab Results  Component Value Date   HDL 46 01/15/2019   HDL 44 02/28/2018   HDL 43 08/09/2017   Lab Results  Component Value Date   LDLCALC 156 (H) 01/15/2019   LDLCALC 144 (H) 02/28/2018   LDLCALC 156 (H) 08/09/2017   Lab Results  Component Value  Date   TRIG 89 01/15/2019   TRIG 80 02/28/2018   TRIG 137 08/09/2017   Lab Results  Component Value Date   CHOLHDL 4.8 01/15/2019   CHOLHDL 4.6 02/28/2018   CHOLHDL 5.3 (H) 08/09/2017   No results found for: LDLDIRECT    Radiology: DG Shoulder Left  Result Date: 07/02/2019 CLINICAL DATA:  Left shoulder pain several weeks.  No injury. EXAM: LEFT SHOULDER -  2+ VIEW COMPARISON:  None. FINDINGS: There is no evidence of fracture or dislocation. There is no evidence of arthropathy or other focal bone abnormality. Soft tissues are unremarkable. IMPRESSION: No acute findings. Electronically Signed   By: Marin Olp M.D.   On: 07/02/2019 12:23   DG Knee Complete 4 Views Left  Result Date: 07/02/2019 CLINICAL DATA:  Left knee pain and swelling at the tibial tuberosity for 1 week. No known injury. EXAM: LEFT KNEE - COMPLETE 4+ VIEW COMPARISON:  None. FINDINGS: No evidence of fracture, dislocation, or joint effusion. No evidence of arthropathy or other focal bone abnormality. Soft tissue swelling is seen anterior to the tibial tuberosity. IMPRESSION: Soft tissue swelling anterior to the tibial tuberosity could be due to bursitis. The exam is otherwise negative. Electronically Signed   By: Inge Rise M.D.   On: 07/02/2019 12:25   US Abdomen Limited RUQ  Result Date: 06/18/2019 CLINICAL DATA:  Acute right upper quadrant abdominal pain. EXAM: ULTRASOUND ABDOMEN LIMITED RIGHT UPPER QUADRANT COMPARISON:  None. FINDINGS: Gallbladder: No gallstones or wall thickening visualized. No sonographic Murphy sign noted by sonographer. Common bile duct: Diameter: 4 mm which is within normal limits. Liver: No focal lesion identified. Within normal limits in parenchymal echogenicity. Portal vein is patent on color Doppler imaging with normal direction of blood flow towards the liver. Other: None. IMPRESSION: No abnormality seen in the right upper quadrant of the abdomen. Electronically Signed   By: Marijo Conception  M.D.   On: 06/18/2019 11:38    EKG: NSR rate 83 normal 06/29/19   ASSESSMENT AND PLAN:    1. Chest Pain:  Atypical low risk cath August 2017 only 40% ramus. ECG unremarkable CRF;s HTN and HLD  Will Order chest CTA to r/o PE and lexiscan myovue to r/o progression of disease in Ramus or new CAD  2. HTN:  Well controlled.  Continue current medications and low sodium Dash type diet.   3. HLD:  ? Compliance with statin f/u primary CK negative 06/18/19  4. GI:  Abdominal pain RUQ [place] 06/18/19 normal normal LFT;s  F/u primary   Signed: Jenkins Rouge 07/04/2019, 9:46 AM

## 2019-07-02 ENCOUNTER — Ambulatory Visit: Payer: 59 | Admitting: Family Medicine

## 2019-07-02 ENCOUNTER — Ambulatory Visit (INDEPENDENT_AMBULATORY_CARE_PROVIDER_SITE_OTHER): Payer: 59

## 2019-07-02 ENCOUNTER — Other Ambulatory Visit: Payer: Self-pay

## 2019-07-02 ENCOUNTER — Encounter: Payer: Self-pay | Admitting: Family Medicine

## 2019-07-02 VITALS — BP 130/76 | HR 70 | Temp 98.0°F | Ht 73.0 in | Wt 258.0 lb

## 2019-07-02 DIAGNOSIS — M25512 Pain in left shoulder: Secondary | ICD-10-CM | POA: Diagnosis not present

## 2019-07-02 DIAGNOSIS — M25562 Pain in left knee: Secondary | ICD-10-CM

## 2019-07-02 DIAGNOSIS — M25462 Effusion, left knee: Secondary | ICD-10-CM

## 2019-07-02 DIAGNOSIS — M7052 Other bursitis of knee, left knee: Secondary | ICD-10-CM

## 2019-07-02 DIAGNOSIS — M62838 Other muscle spasm: Secondary | ICD-10-CM

## 2019-07-02 DIAGNOSIS — M791 Myalgia, unspecified site: Secondary | ICD-10-CM | POA: Diagnosis not present

## 2019-07-02 DIAGNOSIS — M25511 Pain in right shoulder: Secondary | ICD-10-CM

## 2019-07-02 MED ORDER — MELOXICAM 7.5 MG PO TABS: 7.5000 mg | ORAL_TABLET | Freq: Every day | ORAL | 0 refills | Status: DC

## 2019-07-02 MED ORDER — CYCLOBENZAPRINE HCL 5 MG PO TABS: 5.0000 mg | ORAL_TABLET | Freq: Three times a day (TID) | ORAL | 1 refills | Status: DC | PRN

## 2019-07-02 NOTE — Progress Notes (Signed)
Subjective:  Patient ID: Donald [name], male    DOB: 1970/09/19  Age: 49 y.o. MRN: [id number]  CC:  Chief Complaint  Patient presents with  . Follow-up    on pt's Shoulder pain. pt states no change to his condition since his last APPT on 06/17/2018.    HPI Donald [name] presents for   Shoulder pain: Left greater than right, discussed January 13.  Noticed previous few weeks without known injury. Normal CPK.  Suspected trapezius spasm, possible overuse component last visit.  Symptomatic care was discussed with ice, heat, range of motion and stretches, temporary prescription for Flexeril.  Still similar pain on left. Trouble moving shoulder if lying on that side.  Taking flexeril at night - slight improvement - feels better in am.  Same at top of left shoulder, sometimes tight in upper arm. No grip/hand weakness, no neck pain.  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 No new work. No pain with lifting.   L knee swelling Soreness/swelling in front of left knee, 1 week. No injury. No work on knees. Same since last week. No fever. No rash, no specific treatments.   Abdominal pain Right upper quadrant last visit. CBC, CMP, and ultrasound obtained that were reassuring, as well as normal ultrasound on January 13.  Soreness is better, no fever/n/v.   No history of heart disease, or PUD.    DG Shoulder Left  Result Date: 07/02/2019 CLINICAL DATA:  Left shoulder pain several weeks.  No injury. EXAM: LEFT SHOULDER - 2+ VIEW COMPARISON:  None. FINDINGS: There is no evidence of fracture or dislocation. There is no evidence of arthropathy or other focal bone abnormality. Soft tissues are unremarkable. IMPRESSION: No acute findings. Electronically Signed   By: Marin Olp M.D.   On: 07/02/2019 12:23   DG Knee Complete 4 Views Left  Result Date: 07/02/2019 CLINICAL DATA:  Left knee pain and swelling at the tibial tuberosity for 1  week. No known injury. EXAM: LEFT KNEE - COMPLETE 4+ VIEW COMPARISON:  None. FINDINGS: No evidence of fracture, dislocation, or joint effusion. No evidence of arthropathy or other focal bone abnormality. Soft tissue swelling is seen anterior to the tibial tuberosity. IMPRESSION: Soft tissue swelling anterior to the tibial tuberosity could be due to bursitis. The exam is otherwise negative. Electronically Signed   By: Inge Rise M.D.   On: 07/02/2019 12:25   US Abdomen Limited RUQ  Result Date: 06/18/2019 CLINICAL DATA:  Acute right upper quadrant abdominal pain. EXAM: ULTRASOUND ABDOMEN LIMITED RIGHT UPPER QUADRANT COMPARISON:  None. FINDINGS: Gallbladder: No gallstones or wall thickening visualized. No sonographic Murphy sign noted by sonographer. Common bile duct: Diameter: 4 mm which is within normal limits. Liver: No focal lesion identified. Within normal limits in parenchymal echogenicity. Portal vein is patent on color Doppler imaging with normal direction of blood flow towards the liver. Other: None. IMPRESSION: No abnormality seen in the right upper quadrant of the abdomen. Electronically Signed   By: Marijo Conception M.D.   On: 06/18/2019 11:38    Results for orders placed or performed in visit on 06/18/19  Comprehensive metabolic panel  Result Value Ref Range   Glucose 95 65 - 99 mg/dL   BUN 11 6 - 24 mg/dL   Creatinine, Ser 0.97 0.76 - 1.27 mg/dL   GFR calc non Af Amer 91 >59 mL/min/1.73   GFR calc Af Amer 105 >59 mL/min/1.73   BUN/Creatinine  Ratio 11 9 - 20   Sodium 141 134 - 144 mmol/L   Potassium 4.1 3.5 - 5.2 mmol/L   Chloride 102 96 - 106 mmol/L   CO2 22 20 - 29 mmol/L   Calcium 9.4 8.7 - 10.2 mg/dL   Total Protein 7.6 6.0 - 8.5 g/dL   Albumin 4.5 4.0 - 5.0 g/dL   Globulin, Total 3.1 1.5 - 4.5 g/dL   Albumin/Globulin Ratio 1.5 1.2 - 2.2   Bilirubin Total 0.7 0.0 - 1.2 mg/dL   Alkaline Phosphatase 64 39 - 117 IU/L   AST 19 0 - 40 IU/L   ALT 16 0 - 44 IU/L  CK  Result  Value Ref Range   Total CK 191 49 - 439 U/L  POCT CBC  Result Value Ref Range   WBC 5.4 4.6 - 10.2 K/uL   Lymph, poc 2.8 0.6 - 3.4   POC LYMPH PERCENT 51.2 (A) 10 - 50 %L   MID (cbc) 0.5 0 - 0.9   POC MID % 8.9 0 - 12 %M   POC Granulocyte 2.2 2 - 6.9   Granulocyte percent 39.9 37 - 80 %G   RBC 5.04 4.69 - 6.13 M/uL   Hemoglobin 14.2 11 - 14.6 g/dL   HCT, POC 41.9 (A) 29 - 41 %   MCV 83.2 76 - 111 fL   MCH, POC 28.3 27 - 31.2 pg   MCHC 34.0 31.8 - 35.4 g/dL   RDW, POC 14.3 %   Platelet Count, POC 328 142 - 424 K/uL   MPV 7.3 0 - 99.8 fL  POCT urinalysis dipstick  Result Value Ref Range   Color, UA yellow yellow   Clarity, UA clear clear   Glucose, UA negative negative mg/dL   Bilirubin, UA negative negative   Ketones, POC UA negative negative mg/dL   Spec Grav, UA 1.025 1.010 - 1.025   Blood, UA negative negative   pH, UA 8.5 (A) 5.0 - 8.0   Protein Ur, POC negative negative mg/dL   Urobilinogen, UA 0.2 0.2 or 1.0 E.U./dL   Nitrite, UA Negative Negative   Leukocytes, UA Negative Negative       History Patient Active Problem List   Diagnosis Date Noted  . Lower urinary tract symptoms (LUTS) 04/19/2018  . Foamy urine 04/19/2018  . Chest pain 01/20/2016  . Acute URI 01/20/2016  . Hypokalemia 01/20/2016  . Right bundle branch block 03/11/2010  . LUMBAGO 03/09/2010  . DYSURIA 03/09/2010  . History of kidney stones 12/10/2009  . Essential hypertension 05/14/2009  . PARASOMNIA 05/14/2009  . Hyperlipidemia 05/13/2009  . G E R D 05/13/2009   Past Medical History:  Diagnosis Date  . DEPRESSION, MILD   . Dysuria   . G E R D   . Hypercholesterolemia   . Hyperlipidemia    NOT TAKING MEDICATION  . Hypertension   . Lumbago   . NEPHROLITHIASIS, HX OF   . PARASOMNIA   . Right bundle branch block    Past Surgical History:  Procedure Laterality Date  . CARDIAC CATHETERIZATION N/A 01/19/2016   Procedure: Left Heart Cath and Coronary Angiography;  Surgeon: Sherren Mocha, MD;  Location: [facility];  Service: Cardiovascular;  Laterality: N/A;  . HAND SURGERY    . right and left hand     Allergies  Allergen Reactions  . Ace Inhibitors Cough    Lisinopril caused cough   Prior to Admission medications   Medication Sig  Start Date End Date Taking? Authorizing Provider  amLODipine (NORVASC) 10 MG tablet Take 1 tablet (10 mg total) by mouth daily. 01/15/19  Yes [name], MD  aspirin 81 MG tablet Take 1 tablet (81 mg total) by mouth daily. 10/29/15  Yes Jaynee Eagles, PA-C  atorvastatin (LIPITOR) 10 MG tablet TAKE 1 TABLET BY MOUTH EVERY DAY 04/28/19  Yes [name], MD  cyclobenzaprine (FLEXERIL) 5 MG tablet Take 1 tablet (5 mg total) by mouth 3 (three) times daily as needed for muscle spasms (start qhs prn due to sedation). 06/18/19  Yes [name], MD  fluticasone (FLONASE) 50 MCG/ACT nasal spray Place 1 spray into both nostrils daily. 01/15/19  Yes [name], MD  losartan-hydrochlorothiazide (HYZAAR) 100-25 MG tablet Take 1 tablet by mouth daily. 01/15/19  Yes [name], MD   Social History   Socioeconomic History  . Marital status: Married    Spouse name: Not on file  . Number of children: 2  . Years of education: 45  . Highest education level: Not on file  Occupational History  . Occupation: Glass blower/designer    Employer: [employer]  Tobacco Use  . Smoking status: Never Smoker  . Smokeless tobacco: Never Used  Substance and Sexual Activity  . Alcohol use: No    Alcohol/week: 0.0 standard drinks  . Drug use: No  . Sexual activity: Not on file  Other Topics Concern  . Not on file  Social History Narrative   ** Merged History Encounter **       Social Determinants of Health   Financial Resource Strain:   . Difficulty of Paying Living Expenses: Not on file  Food Insecurity:   . Worried About Charity fundraiser in the Last Year: Not on file  . Ran Out of Food in the Last Year: Not on file    Transportation Needs:   . Lack of Transportation (Medical): Not on file  . Lack of Transportation (Non-Medical): Not on file  Physical Activity:   . Days of Exercise per Week: Not on file  . Minutes of Exercise per Session: Not on file  Stress:   . Feeling of Stress : Not on file  Social Connections:   . Frequency of Communication with Friends and Family: Not on file  . Frequency of Social Gatherings with Friends and Family: Not on file  . Attends Religious Services: Not on file  . Active Member of Clubs or Organizations: Not on file  . Attends Archivist Meetings: Not on file  . Marital Status: Not on file  Intimate Partner Violence:   . Fear of Current or Ex-Partner: Not on file  . Emotionally Abused: Not on file  . Physically Abused: Not on file  . Sexually Abused: Not on file    Review of Systems Per HPI.   Objective:   Vitals:   07/02/19 1106  BP: 130/76  Pulse: 70  Temp: 98 F (36.7 C)  TempSrc: Temporal  SpO2: 98%  Weight: 258 lb (117 kg)  Height: 6\' 1"  (1.854 m)     Physical Exam Vitals reviewed.  Constitutional:      General: He is not in acute distress.    Appearance: He is well-developed.  HENT:     Head: Normocephalic and atraumatic.  Cardiovascular:     Rate and Rhythm: Normal rate.  Pulmonary:     Effort: Pulmonary effort is normal.  Abdominal:     General:  Abdomen is flat. There is no distension.     Tenderness: There is no abdominal tenderness. There is no guarding.  Musculoskeletal:       Arms:     Left knee: Swelling (Left knee with slight amount of swelling over tibial tuberosity, no warmth/erythema.  No wounds.  Minimal tenderness to palpation.  Actual joint without effusion, erythema, pain-free range of motion, joint lines nontender.) present. No effusion or bony tenderness. Normal range of motion.     Comments: Clavicle,  joint nontender, no focal bony tenderness.  Neurological:     Mental Status: He is alert and oriented  to person, place, and time.     DG Shoulder Left  Result Date: 07/02/2019 CLINICAL DATA:  Left shoulder pain several weeks.  No injury. EXAM: LEFT SHOULDER - 2+ VIEW COMPARISON:  None. FINDINGS: There is no evidence of fracture or dislocation. There is no evidence of arthropathy or other focal bone abnormality. Soft tissues are unremarkable. IMPRESSION: No acute findings. Electronically Signed   By: Marin Olp M.D.   On: 07/02/2019 12:23   DG Knee Complete 4 Views Left  Result Date: 07/02/2019 CLINICAL DATA:  Left knee pain and swelling at the tibial tuberosity for 1 week. No known injury. EXAM: LEFT KNEE - COMPLETE 4+ VIEW COMPARISON:  None. FINDINGS: No evidence of fracture, dislocation, or joint effusion. No evidence of arthropathy or other focal bone abnormality. Soft tissue swelling is seen anterior to the tibial tuberosity. IMPRESSION: Soft tissue swelling anterior to the tibial tuberosity could be due to bursitis. The exam is otherwise negative. Electronically Signed   By: Inge Rise M.D.   On: 07/02/2019 12:25      Assessment & Plan:  Kyung Woodman is a 49 y.o. male . Myalgia - Plan: DG Shoulder Left, meloxicam (MOBIC) 7.5 MG tablet Trapezius muscle spasm - Plan: DG Shoulder Left Acute pain of left shoulder - Plan: DG Shoulder Left Acute pain of both shoulders - Plan: cyclobenzaprine (FLEXERIL) 5 MG tablet  -Primarily left shoulder, some improvement with muscle relaxant.  Still suspect some overuse/spasm of trapezius, does not appear to be cervical spine source.  X-ray reassuring of shoulder.  -Trial of meloxicam short-term, heat, range of motion, stretches, massage as needed.  Consider PT or orthopedic eval if not improving next 1 week.  Pain and swelling of left knee - Plan: DG Knee Complete 4 Views Left, Apply ace wrap Infrapatellar bursitis of left knee - Plan: Apply ace wrap  -Likely mild infrapatellar bursitis, no wounds, erythema, or signs of infection.  Ace  bandage applied, meloxicam as above may also help.  RTC precautions.   Meds ordered this encounter  Medications  . meloxicam (MOBIC) 7.5 MG tablet    Sig: Take 1 tablet (7.5 mg total) by mouth daily.    Dispense:  30 tablet    Refill:  0  . cyclobenzaprine (FLEXERIL) 5 MG tablet    Sig: Take 1 tablet (5 mg total) by mouth 3 (three) times daily as needed for muscle spasms (start qhs prn due to sedation).    Dispense:  15 tablet    Refill:  1   Patient Instructions    If stomach soreness returns, I recommend meeting with a gastroenterologist. Return to the clinic or go to the nearest emergency room if any of your symptoms worsen or new symptoms occur.  Knee swelling may be mild bursitis.  Apply Ace wrap, anti-inflammatory medicine may also help.  Follow-up if that is not  improving in the next week to 10 days, sooner if worse.  I refilled muscle relaxant, but also try meloxicam once per day for inflammation and soreness on the left side.  If that is not improving in the next 1 week, it may be worthwhile meeting with orthopedics for at the minimum physical therapy.  Let me know.  Return to the clinic or go to the nearest emergency room if any of your symptoms worsen or new symptoms occur.  Bursitis  Bursitis is inflammation and irritation of a bursa, which is one of the small, fluid-filled sacs that cushion and protect the moving parts of your body. These sacs are located between bones and muscles, bones and muscle attachments, or bones and skin areas that are next to bones. A bursa protects those structures from the wear and tear that results from frequent movement. An inflamed bursa causes pain and swelling. Fluid may build up inside the sac. Bursitis is most common near joints, especially the knees, elbows, hips, and shoulders. What are the causes? This condition may be caused by:  Injury from: ? A direct hit (blow), like falling on your knee or elbow. ? Overuse of a joint  (repetitive stress).  Infection. This can happen if bacteria get into a bursa through a cut or scrape near a joint.  Diseases that cause joint inflammation, such as gout and rheumatoid arthritis. What increases the risk? You are more likely to develop this condition if you:  Have a job or hobby that involves a lot of repetitive stress on your joints.  Have a condition that weakens your body's defense system (immune system), such as diabetes, cancer, or HIV.  Do any of these often: ? Lift and reach overhead. ? Kneel or lean on hard surfaces. ? Run or walk. What are the signs or symptoms? The most common symptoms of this condition include:  Pain that gets worse when you move the affected body part or use it to support (bear) your body weight.  Inflammation.  Stiffness. Other symptoms include:  Redness.  Swelling.  Tenderness.  Warmth.  Pain that continues after rest.  Fever or chills. These may occur in bursitis that is caused by infection. How is this diagnosed? This condition may be diagnosed based on:  Your medical history and a physical exam.  Imaging tests, such as an MRI.  A procedure to drain fluid from the bursa with a needle (aspiration). The fluid may be checked for signs of infection or gout.  Blood tests to rule out other causes of inflammation. How is this treated? This condition can usually be treated at home with rest, ice, applying pressure (compression), and raising the body part that is affected (elevation). This is called RICE therapy. For mild bursitis, RICE therapy may be all you need. Other treatments may include:  NSAIDs to treat pain and inflammation.  Corticosteroid medicines to fight inflammation. These medicines may be injected into and around the area of bursitis.  Aspiration of fluid from the bursa to relieve pain and improve movement.  Antibiotic medicine to treat an infected bursa.  A splint, brace, or walking aid, such as a  cane.  Physical therapy if you continue to have pain or limited movement.  Surgery to remove a damaged or infected bursa. This may be needed if other treatments have not worked. Follow these instructions at home: Medicines  Take over-the-counter and prescription medicines only as told by your health care provider.  If you were prescribed an antibiotic  medicine, take it as told by your health care provider. Do not stop taking the antibiotic even if you start to feel better. General instructions   Rest the affected area as told by your health care provider. ? If possible, raise (elevate) the affected area above the level of your heart while you are sitting or lying down. ? Avoid activities that make pain worse.  Use splints, braces, pads, or walking aids as told by your health care provider.  If directed, put ice on the affected area: ? If you have a removable splint or brace, remove it as told by your health care provider. ? Put ice in a plastic bag. ? Place a towel between your skin and the bag or between your splint or brace and the bag. ? Leave the ice on for 20 minutes, 2-3 times a day.  Keep all follow-up visits as told by your health care provider. This is important. Preventing future episodes Take actions to help prevent future episodes of bursitis.  Wear knee pads if you kneel often.  Wear sturdy running or walking shoes that fit you well.  Take breaks regularly from repetitive activity.  Warm up by stretching before doing any activity that takes a lot of effort.  Maintain a healthy weight or lose weight as recommended by your health care provider. If you need help doing this, ask your health care provider.  Exercise regularly. Start any new physical activity gradually. Contact a health care provider if you:  Have a fever.  Have chills.  Have bursitis that is not getting better with treatment or home care. Summary  Bursitis is inflammation and irritation of a  bursa, which is one of the small, fluid-filled sacs that cushion and protect the moving parts of your body.  An inflamed bursa causes pain and swelling.  Bursitis is commonly diagnosed with a physical exam, but other tests are sometimes needed.  This condition can usually be treated at home with rest, ice, applying pressure (compression), and raising the body part that is affected (elevation). This is called RICE therapy. This information is not intended to replace advice given to you by your health care provider. Make sure you discuss any questions you have with your health care provider. Document Revised: 05/04/2017 Document Reviewed: 04/06/2017 Elsevier Patient Education  [employer].   Shoulder Pain Many things can cause shoulder pain, including:  An injury to the shoulder.  Overuse of the shoulder.  Arthritis. The source of the pain can be:  Inflammation.  An injury to the shoulder joint.  An injury to a tendon, ligament, or bone. Follow these instructions at home: Pay attention to changes in your symptoms. Let your health care provider know about them. Follow these instructions to relieve your pain. If you have a sling:  Wear the sling as told by your health care provider. Remove it only as told by your health care provider.  Loosen the sling if your fingers tingle, become numb, or turn cold and blue.  Keep the sling clean.  If the sling is not waterproof: ? Do not let it get wet. Remove it to shower or bathe.  Move your arm as little as possible, but keep your hand moving to prevent swelling. Managing pain, stiffness, and swelling   If directed, put ice on the painful area: ? Put ice in a plastic bag. ? Place a towel between your skin and the bag. ? Leave the ice on for 20 minutes, 2-3 times per  day. Stop applying ice if it does not help with the pain.  Squeeze a soft ball or a foam pad as much as possible. This helps to keep the shoulder from swelling.  It also helps to strengthen the arm. General instructions  Take over-the-counter and prescription medicines only as told by your health care provider.  Keep all follow-up visits as told by your health care provider. This is important. Contact a health care provider if:  Your pain gets worse.  Your pain is not relieved with medicines.  New pain develops in your arm, hand, or fingers. Get help right away if:  Your arm, hand, or fingers: ? Tingle. ? Become numb. ? Become swollen. ? Become painful. ? Turn white or blue. Summary  Shoulder pain can be caused by an injury, overuse, or arthritis.  Pay attention to changes in your symptoms. Let your health care provider know about them.  This condition may be treated with a sling, ice, and pain medicines.  Contact your health care provider if the pain gets worse or new pain develops. Get help right away if your arm, hand, or fingers tingle or become numb, swollen, or painful.  Keep all follow-up visits as told by your health care provider. This is important. This information is not intended to replace advice given to you by your health care provider. Make sure you discuss any questions you have with your health care provider. Document Revised: 12/04/2017 Document Reviewed: 12/04/2017 Elsevier Patient Education  [employer].    If you have lab work done today you will be contacted with your lab results within the next 2 weeks.  If you have not heard from [place] then please contact us. The fastest way to get your results is to register for My Chart.   IF you received an x-ray today, you will receive an invoice from [facility]. Please contact [facility] at [phone] with questions or concerns regarding your invoice.   IF you received labwork today, you will receive an invoice from [facility]. Please contact [facility] at [phone] with questions or concerns regarding your invoice.   Our billing staff will not  be able to assist you with questions regarding bills from these companies.  You will be contacted with the lab results as soon as they are available. The fastest way to get your results is to activate your My Chart account. Instructions are located on the last page of this paperwork. If you have not heard from [place] regarding the results in 2 weeks, please contact this office.         Signed, [name], MD [facility]

## 2019-07-02 NOTE — Patient Instructions (Addendum)
If stomach soreness returns, I recommend meeting with a gastroenterologist. Return to the clinic or go to the nearest emergency room if any of your symptoms worsen or new symptoms occur.  Knee swelling may be mild bursitis.  Apply Ace wrap, anti-inflammatory medicine may also help.  Follow-up if that is not improving in the next week to 10 days, sooner if worse.  I refilled muscle relaxant, but also try meloxicam once per day for inflammation and soreness on the left side.  If that is not improving in the next 1 week, it may be worthwhile meeting with orthopedics for at the minimum physical therapy.  Let me know.  Return to the clinic or go to the nearest emergency room if any of your symptoms worsen or new symptoms occur.  Bursitis  Bursitis is inflammation and irritation of a bursa, which is one of the small, fluid-filled sacs that cushion and protect the moving parts of your body. These sacs are located between bones and muscles, bones and muscle attachments, or bones and skin areas that are next to bones. A bursa protects those structures from the wear and tear that results from frequent movement. An inflamed bursa causes pain and swelling. Fluid may build up inside the sac. Bursitis is most common near joints, especially the knees, elbows, hips, and shoulders. What are the causes? This condition may be caused by:  Injury from: ? A direct hit (blow), like falling on your knee or elbow. ? Overuse of a joint (repetitive stress).  Infection. This can happen if bacteria get into a bursa through a cut or scrape near a joint.  Diseases that cause joint inflammation, such as gout and rheumatoid arthritis. What increases the risk? You are more likely to develop this condition if you:  Have a job or hobby that involves a lot of repetitive stress on your joints.  Have a condition that weakens your body's defense system (immune system), such as diabetes, cancer, or HIV.  Do any of these  often: ? Lift and reach overhead. ? Kneel or lean on hard surfaces. ? Run or walk. What are the signs or symptoms? The most common symptoms of this condition include:  Pain that gets worse when you move the affected body part or use it to support (bear) your body weight.  Inflammation.  Stiffness. Other symptoms include:  Redness.  Swelling.  Tenderness.  Warmth.  Pain that continues after rest.  Fever or chills. These may occur in bursitis that is caused by infection. How is this diagnosed? This condition may be diagnosed based on:  Your medical history and a physical exam.  Imaging tests, such as an MRI.  A procedure to drain fluid from the bursa with a needle (aspiration). The fluid may be checked for signs of infection or gout.  Blood tests to rule out other causes of inflammation. How is this treated? This condition can usually be treated at home with rest, ice, applying pressure (compression), and raising the body part that is affected (elevation). This is called RICE therapy. For mild bursitis, RICE therapy may be all you need. Other treatments may include:  NSAIDs to treat pain and inflammation.  Corticosteroid medicines to fight inflammation. These medicines may be injected into and around the area of bursitis.  Aspiration of fluid from the bursa to relieve pain and improve movement.  Antibiotic medicine to treat an infected bursa.  A splint, brace, or walking aid, such as a cane.  Physical therapy if you continue  to have pain or limited movement.  Surgery to remove a damaged or infected bursa. This may be needed if other treatments have not worked. Follow these instructions at home: Medicines  Take over-the-counter and prescription medicines only as told by your health care provider.  If you were prescribed an antibiotic medicine, take it as told by your health care provider. Do not stop taking the antibiotic even if you start to feel better. General  instructions   Rest the affected area as told by your health care provider. ? If possible, raise (elevate) the affected area above the level of your heart while you are sitting or lying down. ? Avoid activities that make pain worse.  Use splints, braces, pads, or walking aids as told by your health care provider.  If directed, put ice on the affected area: ? If you have a removable splint or brace, remove it as told by your health care provider. ? Put ice in a plastic bag. ? Place a towel between your skin and the bag or between your splint or brace and the bag. ? Leave the ice on for 20 minutes, 2-3 times a day.  Keep all follow-up visits as told by your health care provider. This is important. Preventing future episodes Take actions to help prevent future episodes of bursitis.  Wear knee pads if you kneel often.  Wear sturdy running or walking shoes that fit you well.  Take breaks regularly from repetitive activity.  Warm up by stretching before doing any activity that takes a lot of effort.  Maintain a healthy weight or lose weight as recommended by your health care provider. If you need help doing this, ask your health care provider.  Exercise regularly. Start any new physical activity gradually. Contact a health care provider if you:  Have a fever.  Have chills.  Have bursitis that is not getting better with treatment or home care. Summary  Bursitis is inflammation and irritation of a bursa, which is one of the small, fluid-filled sacs that cushion and protect the moving parts of your body.  An inflamed bursa causes pain and swelling.  Bursitis is commonly diagnosed with a physical exam, but other tests are sometimes needed.  This condition can usually be treated at home with rest, ice, applying pressure (compression), and raising the body part that is affected (elevation). This is called RICE therapy. This information is not intended to replace advice given to you  by your health care provider. Make sure you discuss any questions you have with your health care provider. Document Revised: 05/04/2017 Document Reviewed: 04/06/2017 Elsevier Patient Education  [employer].   Shoulder Pain Many things can cause shoulder pain, including:  An injury to the shoulder.  Overuse of the shoulder.  Arthritis. The source of the pain can be:  Inflammation.  An injury to the shoulder joint.  An injury to a tendon, ligament, or bone. Follow these instructions at home: Pay attention to changes in your symptoms. Let your health care provider know about them. Follow these instructions to relieve your pain. If you have a sling:  Wear the sling as told by your health care provider. Remove it only as told by your health care provider.  Loosen the sling if your fingers tingle, become numb, or turn cold and blue.  Keep the sling clean.  If the sling is not waterproof: ? Do not let it get wet. Remove it to shower or bathe.  Move your arm as little  as possible, but keep your hand moving to prevent swelling. Managing pain, stiffness, and swelling   If directed, put ice on the painful area: ? Put ice in a plastic bag. ? Place a towel between your skin and the bag. ? Leave the ice on for 20 minutes, 2-3 times per day. Stop applying ice if it does not help with the pain.  Squeeze a soft ball or a foam pad as much as possible. This helps to keep the shoulder from swelling. It also helps to strengthen the arm. General instructions  Take over-the-counter and prescription medicines only as told by your health care provider.  Keep all follow-up visits as told by your health care provider. This is important. Contact a health care provider if:  Your pain gets worse.  Your pain is not relieved with medicines.  New pain develops in your arm, hand, or fingers. Get help right away if:  Your arm, hand, or fingers: ? Tingle. ? Become numb. ? Become  swollen. ? Become painful. ? Turn white or blue. Summary  Shoulder pain can be caused by an injury, overuse, or arthritis.  Pay attention to changes in your symptoms. Let your health care provider know about them.  This condition may be treated with a sling, ice, and pain medicines.  Contact your health care provider if the pain gets worse or new pain develops. Get help right away if your arm, hand, or fingers tingle or become numb, swollen, or painful.  Keep all follow-up visits as told by your health care provider. This is important. This information is not intended to replace advice given to you by your health care provider. Make sure you discuss any questions you have with your health care provider. Document Revised: 12/04/2017 Document Reviewed: 12/04/2017 Elsevier Patient Education  [employer].    If you have lab work done today you will be contacted with your lab results within the next 2 weeks.  If you have not heard from [place] then please contact us. The fastest way to get your results is to register for My Chart.   IF you received an x-ray today, you will receive an invoice from [facility]. Please contact [facility] at [phone] with questions or concerns regarding your invoice.   IF you received labwork today, you will receive an invoice from [facility]. Please contact [facility] at [phone] with questions or concerns regarding your invoice.   Our billing staff will not be able to assist you with questions regarding bills from these companies.  You will be contacted with the lab results as soon as they are available. The fastest way to get your results is to activate your My Chart account. Instructions are located on the last page of this paperwork. If you have not heard from [place] regarding the results in 2 weeks, please contact this office.

## 2019-07-04 ENCOUNTER — Ambulatory Visit: Payer: 59 | Admitting: Cardiovascular Disease

## 2019-07-04 ENCOUNTER — Ambulatory Visit (INDEPENDENT_AMBULATORY_CARE_PROVIDER_SITE_OTHER)
Admission: RE | Admit: 2019-07-04 | Discharge: 2019-07-04 | Disposition: A | Discharge status: Home / Self Care | Payer: 59 | Source: Ambulatory Visit | Attending: Cardiovascular Disease | Admitting: Cardiovascular Disease

## 2019-07-04 ENCOUNTER — Encounter: Payer: Self-pay | Admitting: Cardiovascular Disease

## 2019-07-04 ENCOUNTER — Other Ambulatory Visit: Payer: Self-pay

## 2019-07-04 ENCOUNTER — Telehealth: Payer: Self-pay | Admitting: Cardiovascular Disease

## 2019-07-04 VITALS — BP 122/68 | HR 79 | Ht 73.0 in | Wt 266.0 lb

## 2019-07-04 DIAGNOSIS — R079 Chest pain, unspecified: Secondary | ICD-10-CM

## 2019-07-04 DIAGNOSIS — E785 Hyperlipidemia, unspecified: Secondary | ICD-10-CM

## 2019-07-04 LAB — BASIC METABOLIC PANEL
BUN/Creatinine Ratio: 16 (ref 9–20)
BUN: 15 mg/dL (ref 6–24)
CO2: 21 mmol/L (ref 20–29)
Calcium: 9.5 mg/dL (ref 8.7–10.2)
Chloride: 101 mmol/L (ref 96–106)
Creatinine, Ser: 0.96 mg/dL (ref 0.76–1.27)
GFR calc Af Amer: 105 mL/min/{1.73_m2} (ref >59)
GFR calc non Af Amer: 91 mL/min/{1.73_m2} (ref >59)
Glucose: 90 mg/dL (ref 65–99)
Potassium: 4 mmol/L (ref 3.5–5.2)
Sodium: 140 mmol/L (ref 134–144)

## 2019-07-04 MED ORDER — IOHEXOL 350 MG/ML SOLN
100.0000 mL | Freq: Once | INTRAVENOUS | Status: AC | PRN
  Administered 2019-07-04: 100 mL via INTRAVENOUS

## 2019-07-04 NOTE — Patient Instructions (Addendum)
Medication Instructions:   *If you need a refill on your cardiac medications before your next appointment, please call your pharmacy*  Lab Work: Your physician recommends that you have lab work today- BMET  If you have labs (blood work) drawn today and your tests are completely normal, you will receive your results only by: Marland Kitchen MyChart Message (if you have MyChart) OR . A paper copy in the mail If you have any lab test that is abnormal or we need to change your treatment, we will call you to review the results.  Testing/Procedures: Non-Cardiac CT scanning chest CT to rule our PE, (CAT scanning), is a noninvasive, special x-ray that produces cross-sectional images of the body using x-rays and a computer. CT scans help physicians diagnose and treat medical conditions. For some CT exams, a contrast material is used to enhance visibility in the area of the body being studied. CT scans provide greater clarity and reveal more details than regular x-ray exams.  Your physician has requested that you have a lexiscan myoview. For further information please visit [email or web address]. Please follow instruction sheet, as given.  Follow-Up: At [facility], you and your health needs are our priority.  As part of our continuing mission to provide you with exceptional heart care, we have created designated Provider Care Teams.  These Care Teams include your primary Cardiologist (physician) and Advanced Practice Providers (APPs -  Physician Assistants and Nurse Practitioners) who all work together to provide you with the care you need, when you need it.  Your next appointment:   As needed  The format for your next appointment:   In Person  Provider:   You may see Dr. Johnsie Cancel or one of the following Advanced Practice Providers on your designated Care Team:    Truitt Merle, NP  Cecilie Kicks, NP  Kathyrn Drown, NP

## 2019-07-04 NOTE — Telephone Encounter (Signed)
Patient is returning phone call regarding results.

## 2019-07-04 NOTE — Telephone Encounter (Signed)
Called patient back with results. °

## 2019-07-07 ENCOUNTER — Other Ambulatory Visit: Payer: Self-pay | Admitting: Family Medicine

## 2019-07-07 ENCOUNTER — Inpatient Hospital Stay: Admission: RE | Admit: 2019-07-07 | Payer: 59 | Source: Ambulatory Visit

## 2019-07-07 DIAGNOSIS — I10 Essential (primary) hypertension: Secondary | ICD-10-CM

## 2019-07-08 ENCOUNTER — Other Ambulatory Visit: Payer: Self-pay | Admitting: Family Medicine

## 2019-07-08 DIAGNOSIS — R6889 Other general symptoms and signs: Secondary | ICD-10-CM

## 2019-07-08 DIAGNOSIS — R0989 Other specified symptoms and signs involving the circulatory and respiratory systems: Secondary | ICD-10-CM

## 2019-07-09 ENCOUNTER — Telehealth (HOSPITAL_COMMUNITY): Payer: Self-pay | Admitting: *Deleted

## 2019-07-09 NOTE — Telephone Encounter (Signed)
Attempted calling patient to go over instructions for stress test on Friday 07/11/19 at 7:15.  Patients voice mailbox was full so sent an email outlining detailed instructions.

## 2019-07-11 ENCOUNTER — Other Ambulatory Visit: Payer: Self-pay

## 2019-07-11 ENCOUNTER — Ambulatory Visit (HOSPITAL_COMMUNITY): Payer: 59 | Attending: Cardiology

## 2019-07-11 DIAGNOSIS — R079 Chest pain, unspecified: Secondary | ICD-10-CM | POA: Diagnosis present

## 2019-07-11 DIAGNOSIS — E785 Hyperlipidemia, unspecified: Secondary | ICD-10-CM | POA: Diagnosis present

## 2019-07-11 LAB — MYOCARDIAL PERFUSION IMAGING
LV dias vol: 107 mL (ref 62–150)
LV sys vol: 37 mL
Peak HR: 111 {beats}/min
Rest HR: 70 {beats}/min
SDS: 1
SRS: 0
SSS: 1
TID: 0.94

## 2019-07-11 MED ORDER — REGADENOSON 0.4 MG/5ML IV SOLN
0.4000 mg | Freq: Once | INTRAVENOUS | Status: AC
  Administered 2019-07-11: 0.4 mg via INTRAVENOUS

## 2019-07-11 MED ORDER — TECHNETIUM TC 99M TETROFOSMIN IV KIT
10.6000 | PACK | Freq: Once | INTRAVENOUS | Status: AC | PRN
  Administered 2019-07-11: 10.6 via INTRAVENOUS
  Filled 2019-07-11: qty 11

## 2019-07-11 MED ORDER — TECHNETIUM TC 99M TETROFOSMIN IV KIT
32.6000 | PACK | Freq: Once | INTRAVENOUS | Status: AC | PRN
  Administered 2019-07-11: 32.6 via INTRAVENOUS
  Filled 2019-07-11: qty 33

## 2019-07-22 ENCOUNTER — Other Ambulatory Visit: Payer: Self-pay | Admitting: Family Medicine

## 2019-07-22 DIAGNOSIS — I10 Essential (primary) hypertension: Secondary | ICD-10-CM

## 2019-07-29 ENCOUNTER — Other Ambulatory Visit: Payer: Self-pay | Admitting: Family Medicine

## 2019-07-29 DIAGNOSIS — M791 Myalgia, unspecified site: Secondary | ICD-10-CM

## 2019-07-29 NOTE — Telephone Encounter (Signed)
Requested Prescriptions  Pending Prescriptions Disp Refills  . meloxicam (MOBIC) 7.5 MG tablet [Pharmacy Med Name: MELOXICAM 7.5 MG TABLET] 30 tablet 0    Sig: TAKE 1 TABLET BY MOUTH EVERY DAY     Analgesics:  COX2 Inhibitors Passed - 07/29/2019  1:19 AM      Passed - HGB in normal range and within 360 days    Hemoglobin  Date Value Ref Range Status  06/18/2019 14.2 11 - 14.6 g/dL Final  09/23/2018 14.3 13.0 - 17.0 g/dL Final         Passed - Cr in normal range and within 360 days    Creat  Date Value Ref Range Status  03/28/2016 0.92 0.60 - 1.35 mg/dL Final   Creatinine, Ser  Date Value Ref Range Status  07/04/2019 0.96 0.76 - 1.27 mg/dL Final         Passed - Patient is not pregnant      Passed - Valid encounter within last 12 months    Recent Outpatient Visits          3 weeks ago Myalgia   [facility], Ranell Patrick, MD   1 month ago Acute pain of both shoulders   [facility], Ranell Patrick, MD   5 months ago Atypical chest pain   [facility] [place], MD   6 months ago Essential hypertension   [facility], Ranell Patrick, MD   6 months ago Annual physical exam   [facility], Ranell Patrick, MD

## 2019-08-10 ENCOUNTER — Other Ambulatory Visit: Payer: Self-pay | Admitting: Family Medicine

## 2019-08-10 NOTE — Telephone Encounter (Signed)
Requested Prescriptions  Pending Prescriptions Disp Refills  . atorvastatin (LIPITOR) 10 MG tablet [Pharmacy Med Name: ATORVASTATIN 10 MG TABLET] 90 tablet 1    Sig: TAKE 1 TABLET BY MOUTH EVERY DAY     Cardiovascular:  Antilipid - Statins Failed - 08/10/2019  9:27 AM      Failed - Total Cholesterol in normal range and within 360 days    Cholesterol, Total  Date Value Ref Range Status  01/15/2019 220 (H) 100 - 199 mg/dL Final         Failed - LDL in normal range and within 360 days    LDL Calculated  Date Value Ref Range Status  01/15/2019 156 (H) 0 - 99 mg/dL Final         Passed - HDL in normal range and within 360 days    HDL  Date Value Ref Range Status  01/15/2019 46 >39 mg/dL Final         Passed - Triglycerides in normal range and within 360 days    Triglycerides  Date Value Ref Range Status  01/15/2019 89 0 - 149 mg/dL Final         Passed - Patient is not pregnant      Passed - Valid encounter within last 12 months    Recent Outpatient Visits          1 month ago Myalgia   [facility], Ranell Patrick, MD   1 month ago Acute pain of both shoulders   [facility], Ranell Patrick, MD   5 months ago Atypical chest pain   [facility], Ranell Patrick, MD   6 months ago Essential hypertension   [facility], Ranell Patrick, MD   6 months ago Annual physical exam   [facility], Ranell Patrick, MD

## 2019-08-25 ENCOUNTER — Other Ambulatory Visit: Payer: Self-pay | Admitting: Family Medicine

## 2019-08-25 DIAGNOSIS — M791 Myalgia, unspecified site: Secondary | ICD-10-CM

## 2019-11-20 ENCOUNTER — Other Ambulatory Visit: Payer: Self-pay | Admitting: Family Medicine

## 2019-11-20 DIAGNOSIS — M791 Myalgia, unspecified site: Secondary | ICD-10-CM

## 2019-11-27 ENCOUNTER — Ambulatory Visit: Payer: 59 | Admitting: Emergency Medicine

## 2019-12-27 ENCOUNTER — Ambulatory Visit (HOSPITAL_COMMUNITY)
Admission: EM | Admit: 2019-12-27 | Discharge: 2019-12-27 | Disposition: A | Discharge status: Home / Self Care | Payer: 59 | Attending: Internal Medicine | Admitting: Internal Medicine

## 2019-12-27 ENCOUNTER — Encounter (HOSPITAL_COMMUNITY): Payer: Self-pay | Admitting: Emergency Medicine

## 2019-12-27 DIAGNOSIS — R109 Unspecified abdominal pain: Secondary | ICD-10-CM

## 2019-12-27 DIAGNOSIS — N390 Urinary tract infection, site not specified: Secondary | ICD-10-CM

## 2019-12-27 DIAGNOSIS — R300 Dysuria: Secondary | ICD-10-CM | POA: Diagnosis not present

## 2019-12-27 DIAGNOSIS — G629 Polyneuropathy, unspecified: Secondary | ICD-10-CM | POA: Diagnosis not present

## 2019-12-27 LAB — POCT URINALYSIS DIP (DEVICE)
Bilirubin Urine: NEGATIVE
Glucose, UA: NEGATIVE mg/dL
Hgb urine dipstick: NEGATIVE
Ketones, ur: NEGATIVE mg/dL
Leukocytes,Ua: NEGATIVE
Nitrite: NEGATIVE
Protein, ur: NEGATIVE mg/dL
Specific Gravity, Urine: 1.02 (ref 1.005–1.030)
Urobilinogen, UA: 0.2 mg/dL (ref 0.0–1.0)
pH: 7 (ref 5.0–8.0)

## 2019-12-27 MED ORDER — GABAPENTIN 300 MG PO CAPS
300.0000 mg | ORAL_CAPSULE | Freq: Three times a day (TID) | ORAL | 0 refills | Status: DC
Start: 2019-12-27 — End: 2020-08-19

## 2019-12-27 MED ORDER — PHENAZOPYRIDINE HCL 200 MG PO TABS
200.0000 mg | ORAL_TABLET | Freq: Three times a day (TID) | ORAL | 0 refills | Status: DC
Start: 2019-12-27 — End: 2020-08-19

## 2019-12-27 NOTE — ED Triage Notes (Signed)
Pt c/o RLQ abd pain and trouble with urination x 2 days. Pt states to urinate that it burns afterwards. He also states he has burning on the palms of both hands and the soles  Of both feet. Pt states he took tylenol this morning but it didn't help.

## 2019-12-27 NOTE — Discharge Instructions (Addendum)
I have sent in pyridium for urinary discomfort. You may take this medication every 6 hours as needed. This medication will turn your urine orange in color.  I have sent in gabapentin for you to take for the burning pain in your feet  Follow up in this office or with primary care as needed  Follow up in the ER if you are experiencing any trouble swallowing, trouble breathing, other concerning symptoms

## 2019-12-27 NOTE — ED Provider Notes (Signed)
[place]   [id number] 12/27/19 Arrival Time: 1640  [facility]:SLHTD PAIN  SUBJECTIVE: History from: patient. Donald [name] is a 49 y.o. male complains of right-sided flank pain, right-sided abdominal pain.  Reports he has history of kidney stones.  Also reports that he has been having burning in his feet.  Denies ever having this before.  Has not taken any medications for the back pain or for his feet.  Describes the pain is intermittent and worse with activity.  Reports that flank pain is achy and foot pain is burning.   Denies fever, chills, erythema, ecchymosis, effusion, weakness, numbness and tingling, saddle paresthesias, loss of bowel or bladder function.      ROS: As per HPI.  All other pertinent ROS negative.     Past Medical History:  Diagnosis Date  . DEPRESSION, MILD   . Dysuria   . G E R D   . Hypercholesterolemia   . Hyperlipidemia    NOT TAKING MEDICATION  . Hypertension   . Lumbago   . NEPHROLITHIASIS, HX OF   . PARASOMNIA   . Right bundle branch block    Past Surgical History:  Procedure Laterality Date  . CARDIAC CATHETERIZATION N/A 01/19/2016   Procedure: Left Heart Cath and Coronary Angiography;  Surgeon: Sherren Mocha, MD;  Location: [facility];  Service: Cardiovascular;  Laterality: N/A;  . HAND SURGERY    . right and left hand     Allergies  Allergen Reactions  . Ace Inhibitors Cough    Lisinopril caused cough   No current facility-administered medications on file prior to encounter.   Current Outpatient Medications on File Prior to Encounter  Medication Sig Dispense Refill  . amLODipine (NORVASC) 10 MG tablet TAKE 1 TABLET BY MOUTH EVERY DAY 90 tablet 1  . aspirin 81 MG tablet Take 1 tablet (81 mg total) by mouth daily. 30 tablet 5  . atorvastatin (LIPITOR) 10 MG tablet TAKE 1 TABLET BY MOUTH EVERY DAY 90 tablet 1  . fluticasone (FLONASE) 50 MCG/ACT nasal spray USE 1 SPRAY IN BOTH NOSTRILS DAILY 48 mL 2  .  losartan-hydrochlorothiazide (HYZAAR) 100-25 MG tablet TAKE 1 TABLET BY MOUTH EVERY DAY 90 tablet 1  . cyclobenzaprine (FLEXERIL) 5 MG tablet Take 1 tablet (5 mg total) by mouth 3 (three) times daily as needed for muscle spasms (start qhs prn due to sedation). 15 tablet 1  . meloxicam (MOBIC) 7.5 MG tablet TAKE 1 TABLET BY MOUTH EVERY DAY 90 tablet 0   Social History   Socioeconomic History  . Marital status: Married    Spouse name: Not on file  . Number of children: 2  . Years of education: 16  . Highest education level: Not on file  Occupational History  . Occupation: Glass blower/designer    Employer: [employer]  Tobacco Use  . Smoking status: Never Smoker  . Smokeless tobacco: Never Used  Substance and Sexual Activity  . Alcohol use: No    Alcohol/week: 0.0 standard drinks  . Drug use: No  . Sexual activity: Not on file  Other Topics Concern  . Not on file  Social History Narrative   ** Merged History Encounter **       Social Determinants of Health   Financial Resource Strain:   . Difficulty of Paying Living Expenses:   Food Insecurity:   . Worried About Charity fundraiser in the Last Year:   . [place] in the Last  Year:   Transportation Needs:   . Film/video editor (Medical):   Marland Kitchen Lack of Transportation (Non-Medical):   Physical Activity:   . Days of Exercise per Week:   . Minutes of Exercise per Session:   Stress:   . Feeling of Stress :   Social Connections:   . Frequency of Communication with Friends and Family:   . Frequency of Social Gatherings with Friends and Family:   . Attends Religious Services:   . Active Member of Clubs or Organizations:   . Attends Archivist Meetings:   Marland Kitchen Marital Status:   Intimate Partner Violence:   . Fear of Current or Ex-Partner:   . Emotionally Abused:   Marland Kitchen Physically Abused:   . Sexually Abused:    Family History  Problem Relation Age of Onset  . Hypertension Mother   . Hypertension Sister      OBJECTIVE:  There were no vitals filed for this visit.  General appearance: ALERT; in no acute distress.  Head: NCAT Lungs: Normal respiratory effort CV:  pulses 2+ bilaterally. Cap refill < 2 seconds Musculoskeletal:  Inspection: Skin warm, dry, clear and intact without obvious erythema, effusion, or ecchymosis.  Palpation: Nontender to palpation ROM: FROM active and passive Skin: warm and dry Neurologic: Ambulates without difficulty; Sensation intact about the upper/ lower extremities Psychological: alert and cooperative; normal mood and affect  DIAGNOSTIC STUDIES:  No results found.   ASSESSMENT & PLAN:  1. Neuropathy   2. Dysuria   3. Right flank pain      Meds ordered this encounter  Medications  . gabapentin (NEURONTIN) 300 MG capsule    Sig: Take 1 capsule (300 mg total) by mouth 3 (three) times daily.    Dispense:  30 capsule    Refill:  0    Order Specific Question:   Supervising Provider    Answer:   Chase Picket [id number]  . phenazopyridine (PYRIDIUM) 200 MG tablet    Sig: Take 1 tablet (200 mg total) by mouth 3 (three) times daily.    Dispense:  6 tablet    Refill:  0    Order Specific Question:   Supervising Provider    Answer:   Chase Picket [[id number]]   Unsure of etiology of neuropathy, patient does not have history of diabetes No sugar in the urine today Prescribed gabapentin, follow-up with primary care Will prescribe Pyridium for dysuria Possibly kidney stone, no evidence in UA Negative for infection Return or go to the ER if you have any new or worsening symptoms (fever, chills, chest pain, abdominal pain, changes in bowel or bladder habits, pain radiating into lower legs)   Reviewed expectations re: course of current medical issues. Questions answered. Outlined signs and symptoms indicating need for more acute intervention. Patient verbalized understanding. After Visit Summary given.       Faustino Congress, NP 12/28/19  1006

## 2019-12-29 ENCOUNTER — Other Ambulatory Visit: Payer: Self-pay | Admitting: Family Medicine

## 2019-12-29 DIAGNOSIS — I10 Essential (primary) hypertension: Secondary | ICD-10-CM

## 2020-01-07 ENCOUNTER — Ambulatory Visit: Payer: 59 | Admitting: Emergency Medicine

## 2020-01-13 ENCOUNTER — Encounter: Payer: Self-pay | Admitting: Emergency Medicine

## 2020-01-13 ENCOUNTER — Other Ambulatory Visit: Payer: Self-pay

## 2020-01-13 ENCOUNTER — Ambulatory Visit (INDEPENDENT_AMBULATORY_CARE_PROVIDER_SITE_OTHER): Payer: 59

## 2020-01-13 ENCOUNTER — Ambulatory Visit: Payer: 59 | Admitting: Emergency Medicine

## 2020-01-13 VITALS — BP 135/75 | HR 70 | Temp 97.6°F | Ht 74.0 in | Wt 251.6 lb

## 2020-01-13 DIAGNOSIS — R1011 Right upper quadrant pain: Secondary | ICD-10-CM

## 2020-01-13 LAB — POCT URINALYSIS DIP (MANUAL ENTRY)
Bilirubin, UA: NEGATIVE
Blood, UA: NEGATIVE
Glucose, UA: NEGATIVE mg/dL
Ketones, POC UA: NEGATIVE mg/dL
Leukocytes, UA: NEGATIVE
Nitrite, UA: NEGATIVE
Protein Ur, POC: NEGATIVE mg/dL
Spec Grav, UA: 1.02 (ref 1.010–1.025)
Urobilinogen, UA: 0.2 E.U./dL
pH, UA: 6 (ref 5.0–8.0)

## 2020-01-13 LAB — POCT CBC
Granulocyte percent: 40.9 %G (ref 37–80)
HCT, POC: 42.4 % — AB (ref 29–41)
Hemoglobin: 14 g/dL (ref 11–14.6)
Lymph, poc: 3.3 (ref 0.6–3.4)
MCH, POC: 28.3 pg (ref 27–31.2)
MCHC: 33 g/dL (ref 31.8–35.4)
MCV: 85.9 fL (ref 76–111)
MID (cbc): 0.4 (ref 0–0.9)
MPV: 7.5 fL (ref 0–99.8)
POC Granulocyte: 2.6 (ref 2–6.9)
POC LYMPH PERCENT: 52.8 %L — AB (ref 10–50)
POC MID %: 6.3 %M (ref 0–12)
Platelet Count, POC: 315 10*3/uL (ref 142–424)
RBC: 4.94 M/uL (ref 4.69–6.13)
RDW, POC: 14.3 %
WBC: 6.3 10*3/uL (ref 4.6–10.2)

## 2020-01-13 NOTE — Patient Instructions (Addendum)
If you have lab work done today you will be contacted with your lab results within the next 2 weeks.  If you have not heard from [place] then please contact us. The fastest way to get your results is to register for My Chart.   IF you received an x-ray today, you will receive an invoice from [facility]. Please contact [facility] at [phone] with questions or concerns regarding your invoice.   IF you received labwork today, you will receive an invoice from [place]. Please contact [facility] at [phone] with questions or concerns regarding your invoice.   Our billing staff will not be able to assist you with questions regarding bills from these companies.  You will be contacted with the lab results as soon as they are available. The fastest way to get your results is to activate your My Chart account. Instructions are located on the last page of this paperwork. If you have not heard from [place] regarding the results in 2 weeks, please contact this office.     Abdominal Pain, Adult Many things can cause belly (abdominal) pain. Most times, belly pain is not dangerous. Many cases of belly pain can be watched and treated at home. Sometimes, though, belly pain is serious. Your doctor will try to find the cause of your belly pain. Follow these instructions at home:  Medicines  Take over-the-counter and prescription medicines only as told by your doctor.  Do not take medicines that help you poop (laxatives) unless told by your doctor. General instructions  Watch your belly pain for any changes.  Drink enough fluid to keep your pee (urine) pale yellow.  Keep all follow-up visits as told by your doctor. This is important. Contact a doctor if:  Your belly pain changes or gets worse.  You are not hungry, or you lose weight without trying.  You are having trouble pooping (constipated) or have watery poop (diarrhea) for more than 2-3 days.  You have pain when you pee  or poop.  Your belly pain wakes you up at night.  Your pain gets worse with meals, after eating, or with certain foods.  You are vomiting and cannot keep anything down.  You have a fever.  You have blood in your pee. Get help right away if:  Your pain does not go away as soon as your doctor says it should.  You cannot stop vomiting.  Your pain is only in areas of your belly, such as the right side or the left lower part of the belly.  You have bloody or black poop, or poop that looks like tar.  You have very bad pain, cramping, or bloating in your belly.  You have signs of not having enough fluid or water in your body (dehydration), such as: ? Dark pee, very little pee, or no pee. ? Cracked lips. ? Dry mouth. ? Sunken eyes. ? Sleepiness. ? Weakness.  You have trouble breathing or chest pain. Summary  Many cases of belly pain can be watched and treated at home.  Watch your belly pain for any changes.  Take over-the-counter and prescription medicines only as told by your doctor.  Contact a doctor if your belly pain changes or gets worse.  Get help right away if you have very bad pain, cramping, or bloating in your belly. This information is not intended to replace advice given to you by your health care provider. Make sure you discuss any questions you have with your health  care provider. Document Revised: 09/30/2018 Document Reviewed: 09/30/2018 Elsevier Patient Education  [place].

## 2020-01-13 NOTE — Progress Notes (Signed)
Donald [name] 49 y.o.   Chief Complaint  Patient presents with  . Abdominal Pain    Pt stated that he has been having some Rt flank pain for the past 2wks. Pt stated that he also feels both feet burning for the past 2wks also.    HISTORY OF PRESENT ILLNESS: This is a 49 y.o. male complaining of pain to right upper abdominal area for the past 3 weeks.  Intermittent pain, at times sharp, without radiation, sometimes lasting a couple of hours, no changes with eating, not associated with any other symptoms.  Able to eat and drink.  Denies nausea or vomiting.  Moving bowels okay.  Denies constipation or diarrhea.  Denies rectal bleeding or melena.  Denies fever or chills.  No known history of gallbladder or liver disease.  Denies any other significant symptoms.  HPI   Prior to Admission medications   Medication Sig Start Date End Date Taking? Authorizing Provider  amLODipine (NORVASC) 10 MG tablet TAKE 1 TABLET BY MOUTH EVERY DAY 07/22/19  Yes [name], MD  aspirin 81 MG tablet Take 1 tablet (81 mg total) by mouth daily. 10/29/15  Yes Jaynee Eagles, PA-C  atorvastatin (LIPITOR) 10 MG tablet TAKE 1 TABLET BY MOUTH EVERY DAY 08/10/19  Yes [name], MD  cyclobenzaprine (FLEXERIL) 5 MG tablet Take 1 tablet (5 mg total) by mouth 3 (three) times daily as needed for muscle spasms (start qhs prn due to sedation). 07/02/19  Yes [name], MD  fluticasone [facility]) 50 MCG/ACT nasal spray USE 1 SPRAY IN BOTH NOSTRILS DAILY 07/08/19  Yes [name], MD  gabapentin (NEURONTIN) 300 MG capsule Take 1 capsule (300 mg total) by mouth 3 (three) times daily. 12/27/19  Yes Faustino Congress, NP  losartan-hydrochlorothiazide (HYZAAR) 100-25 MG tablet TAKE 1 TABLET BY MOUTH EVERY DAY 12/29/19  Yes [name], MD  meloxicam (MOBIC) 7.5 MG tablet TAKE 1 TABLET BY MOUTH EVERY DAY 11/20/19  Yes [name], MD  phenazopyridine (PYRIDIUM) 200 MG tablet Take 1 tablet (200 mg total)  by mouth 3 (three) times daily. 12/27/19  Yes Faustino Congress, NP    Allergies  Allergen Reactions  . Ace Inhibitors Cough    Lisinopril caused cough    Patient Active Problem List   Diagnosis Date Noted  . Lower urinary tract symptoms (LUTS) 04/19/2018  . Foamy urine 04/19/2018  . Chest pain 01/20/2016  . Acute URI 01/20/2016  . Hypokalemia 01/20/2016  . Right bundle branch block 03/11/2010  . LUMBAGO 03/09/2010  . DYSURIA 03/09/2010  . History of kidney stones 12/10/2009  . Essential hypertension 05/14/2009  . PARASOMNIA 05/14/2009  . Hyperlipidemia 05/13/2009  . G E R D 05/13/2009    Past Medical History:  Diagnosis Date  . DEPRESSION, MILD   . Dysuria   . G E R D   . Hypercholesterolemia   . Hyperlipidemia    NOT TAKING MEDICATION  . Hypertension   . Lumbago   . NEPHROLITHIASIS, HX OF   . PARASOMNIA   . Right bundle branch block     Past Surgical History:  Procedure Laterality Date  . CARDIAC CATHETERIZATION N/A 01/19/2016   Procedure: Left Heart Cath and Coronary Angiography;  Surgeon: Sherren Mocha, MD;  Location: [place] [facility];  Service: Cardiovascular;  Laterality: N/A;  . HAND SURGERY    . right and left hand      Social History   Socioeconomic History  . Marital status:  Married    Spouse name: Not on file  . Number of children: 2  . Years of education: 69  . Highest education level: Not on file  Occupational History  . Occupation: Glass blower/designer    Employer: [employer]  Tobacco Use  . Smoking status: Never Smoker  . Smokeless tobacco: Never Used  Substance and Sexual Activity  . Alcohol use: No    Alcohol/week: 0.0 standard drinks  . Drug use: No  . Sexual activity: Not on file  Other Topics Concern  . Not on file  Social History Narrative   ** Merged History Encounter **       Social Determinants of Health   Financial Resource Strain:   . Difficulty of Paying Living Expenses:   Food Insecurity:   . Worried About  Charity fundraiser in the Last Year:   . Arboriculturist in the Last Year:   Transportation Needs:   . Film/video editor (Medical):   Marland Kitchen Lack of Transportation (Non-Medical):   Physical Activity:   . Days of Exercise per Week:   . Minutes of Exercise per Session:   Stress:   . Feeling of Stress :   Social Connections:   . Frequency of Communication with Friends and Family:   . Frequency of Social Gatherings with Friends and Family:   . Attends Religious Services:   . Active Member of Clubs or Organizations:   . Attends Archivist Meetings:   Marland Kitchen Marital Status:   Intimate Partner Violence:   . Fear of Current or Ex-Partner:   . Emotionally Abused:   Marland Kitchen Physically Abused:   . Sexually Abused:     Family History  Problem Relation Age of Onset  . Hypertension Mother   . Hypertension Sister      Review of Systems  Constitutional: Negative.  Negative for chills and fever.  HENT: Negative.  Negative for congestion and sore throat.   Respiratory: Negative for cough and shortness of breath.   Cardiovascular: Negative.  Negative for chest pain and palpitations.  Gastrointestinal: Positive for abdominal pain. Negative for blood in stool, constipation, diarrhea, nausea and vomiting.  Genitourinary: Negative.  Negative for dysuria, flank pain and hematuria.  Musculoskeletal: Negative.   Skin: Negative.  Negative for rash.  Neurological: Negative for dizziness and headaches.  All other systems reviewed and are negative.  Today's Vitals   01/13/20 1453 01/13/20 1457  BP: (!) 144/80 135/75  Pulse: 70   Temp: 97.6 F (36.4 C)   TempSrc: Temporal   SpO2: 98%   Weight: 251 lb 9.6 oz (114.1 kg)   Height: 6\' 2"  (1.88 m)    Body mass index is 32.3 kg/m.   Physical Exam Vitals reviewed.  Constitutional:      Appearance: He is well-developed.  HENT:     Head: Normocephalic.     Mouth/Throat:     Mouth: Mucous membranes are moist.     Pharynx: Oropharynx is  clear.  Eyes:     Extraocular Movements: Extraocular movements intact.     Conjunctiva/sclera: Conjunctivae normal.     Pupils: Pupils are equal, round, and reactive to light.  Cardiovascular:     Rate and Rhythm: Normal rate and regular rhythm.     Pulses: Normal pulses.     Heart sounds: Normal heart sounds.  Pulmonary:     Effort: Pulmonary effort is normal.     Breath sounds: Normal breath sounds.  Abdominal:  General: Bowel sounds are normal. There is no distension.     Palpations: Abdomen is soft. There is no mass.     Tenderness: There is abdominal tenderness (Mild tenderness to right upper quadrant). There is no right CVA tenderness or left CVA tenderness.  Musculoskeletal:        General: Normal range of motion.     Cervical back: Normal range of motion and neck supple.  Skin:    General: Skin is warm and dry.     Capillary Refill: Capillary refill takes less than 2 seconds.  Neurological:     General: No focal deficit present.     Mental Status: He is alert and oriented to person, place, and time.  Psychiatric:        Mood and Affect: Mood normal.        Behavior: Behavior normal.    Results for orders placed or performed in visit on 01/13/20 (from the past 24 hour(s))  POCT urinalysis dipstick     Status: None   Collection Time: 01/13/20  3:01 PM  Result Value Ref Range   Color, UA yellow yellow   Clarity, UA clear clear   Glucose, UA negative negative mg/dL   Bilirubin, UA negative negative   Ketones, POC UA negative negative mg/dL   Spec Grav, UA 1.020 1.010 - 1.025   Blood, UA negative negative   pH, UA 6.0 5.0 - 8.0   Protein Ur, POC negative negative mg/dL   Urobilinogen, UA 0.2 0.2 or 1.0 E.U./dL   Nitrite, UA Negative Negative   Leukocytes, UA Negative Negative  POCT CBC     Status: Abnormal   Collection Time: 01/13/20  3:46 PM  Result Value Ref Range   WBC 6.3 4.6 - 10.2 K/uL   Lymph, poc 3.3 0.6 - 3.4   POC LYMPH PERCENT 52.8 (A) 10 - 50 %L    MID (cbc) 0.4 0 - 0.9   POC MID % 6.3 0 - 12 %M   POC Granulocyte 2.6 2 - 6.9   Granulocyte percent 40.9 37 - 80 %G   RBC 4.94 4.69 - 6.13 M/uL   Hemoglobin 14.0 11 - 14.6 g/dL   HCT, POC 42.4 (A) 29 - 41 %   MCV 85.9 76 - 111 fL   MCH, POC 28.3 27 - 31.2 pg   MCHC 33.0 31.8 - 35.4 g/dL   RDW, POC 14.3 %   Platelet Count, POC 315 142 - 424 K/uL   MPV 7.5 0 - 99.8 fL   DG ABD ACUTE 2+V W 1V CHEST  Result Date: 01/13/2020 CLINICAL DATA:  Right upper abdominal pain EXAM: DG ABDOMEN ACUTE W/ 1V CHEST COMPARISON:  02/06/2008 FINDINGS: Large stool burden throughout the colon. The bowel gas pattern is normal. There is no evidence of free intraperitoneal air. No suspicious radio-opaque calculi or other significant radiographic abnormality is seen. Heart size and mediastinal contours are within normal limits. Both lungs are clear. IMPRESSION: Large stool burden.  No acute findings. Electronically Signed   By: Rolm Baptise M.D.   On: 01/13/2020 15:56   A total of 45 minutes was spent with the patient, greater than 50% of which was in counseling/coordination of care regarding differential diagnosis of right upper abdominal pain, review of most recent office visit notes, review of most recent blood work results including today's CBC, review of today's x-rays, review of all medications, diet and nutrition, need for additional diagnostic work-up including right upper quadrant ultrasound, prognosis  and need for follow-up.   ASSESSMENT & PLAN: Roark was seen today for abdominal pain.  Diagnoses and all orders for this visit:  Right upper quadrant abdominal pain -     POCT urinalysis dipstick -     POCT CBC -     Comprehensive metabolic panel -     Lipase -     DG ABD ACUTE 2+V W 1V CHEST -     US Abdomen Limited RUQ; Future    Patient Instructions       If you have lab work done today you will be contacted with your lab results within the next 2 weeks.  If you have not heard from [place]  then please contact us. The fastest way to get your results is to register for My Chart.   IF you received an x-ray today, you will receive an invoice from [facility]. Please contact [facility]iology at [phone] with questions or concerns regarding your invoice.   IF you received labwork today, you will receive an invoice from [place]. Please contact [facility] at [phone] with questions or concerns regarding your invoice.   Our billing staff will not be able to assist you with questions regarding bills from these companies.  You will be contacted with the lab results as soon as they are available. The fastest way to get your results is to activate your My Chart account. Instructions are located on the last page of this paperwork. If you have not heard from [place] regarding the results in 2 weeks, please contact this office.     Abdominal Pain, Adult Many things can cause belly (abdominal) pain. Most times, belly pain is not dangerous. Many cases of belly pain can be watched and treated at home. Sometimes, though, belly pain is serious. Your doctor will try to find the cause of your belly pain. Follow these instructions at home:  Medicines  Take over-the-counter and prescription medicines only as told by your doctor.  Do not take medicines that help you poop (laxatives) unless told by your doctor. General instructions  Watch your belly pain for any changes.  Drink enough fluid to keep your pee (urine) pale yellow.  Keep all follow-up visits as told by your doctor. This is important. Contact a doctor if:  Your belly pain changes or gets worse.  You are not hungry, or you lose weight without trying.  You are having trouble pooping (constipated) or have watery poop (diarrhea) for more than 2-3 days.  You have pain when you pee or poop.  Your belly pain wakes you up at night.  Your pain gets worse with meals, after eating, or with certain foods.  You are  vomiting and cannot keep anything down.  You have a fever.  You have blood in your pee. Get help right away if:  Your pain does not go away as soon as your doctor says it should.  You cannot stop vomiting.  Your pain is only in areas of your belly, such as the right side or the left lower part of the belly.  You have bloody or black poop, or poop that looks like tar.  You have very bad pain, cramping, or bloating in your belly.  You have signs of not having enough fluid or water in your body (dehydration), such as: ? Dark pee, very little pee, or no pee. ? Cracked lips. ? Dry mouth. ? Sunken eyes. ? Sleepiness. ? Weakness.  You have trouble breathing or chest pain.  Summary  Many cases of belly pain can be watched and treated at home.  Watch your belly pain for any changes.  Take over-the-counter and prescription medicines only as told by your doctor.  Contact a doctor if your belly pain changes or gets worse.  Get help right away if you have very bad pain, cramping, or bloating in your belly. This information is not intended to replace advice given to you by your health care provider. Make sure you discuss any questions you have with your health care provider. Document Revised: 09/30/2018 Document Reviewed: 09/30/2018 Elsevier Patient Education  2020 Elsevier Inc.      Agustina Caroli, MD [facility]

## 2020-01-14 LAB — COMPREHENSIVE METABOLIC PANEL
ALT: 18 IU/L (ref 0–44)
AST: 21 IU/L (ref 0–40)
Albumin/Globulin Ratio: 1.6 (ref 1.2–2.2)
Albumin: 4.6 g/dL (ref 3.8–4.9)
Alkaline Phosphatase: 60 IU/L (ref 48–121)
BUN/Creatinine Ratio: 14 (ref 9–20)
BUN: 15 mg/dL (ref 6–24)
Bilirubin Total: 0.8 mg/dL (ref 0.0–1.2)
CO2: 24 mmol/L (ref 20–29)
Calcium: 9.6 mg/dL (ref 8.7–10.2)
Chloride: 100 mmol/L (ref 96–106)
Creatinine, Ser: 1.04 mg/dL (ref 0.76–1.27)
GFR calc Af Amer: 96 mL/min/{1.73_m2} (ref >59)
GFR calc non Af Amer: 83 mL/min/{1.73_m2} (ref >59)
Globulin, Total: 2.8 g/dL (ref 1.5–4.5)
Glucose: 95 mg/dL (ref 65–99)
Potassium: 3.4 mmol/L — ABNORMAL LOW (ref 3.5–5.2)
Sodium: 140 mmol/L (ref 134–144)
Total Protein: 7.4 g/dL (ref 6.0–8.5)

## 2020-01-14 LAB — LIPASE: Lipase: 22 U/L (ref 13–78)

## 2020-01-17 ENCOUNTER — Other Ambulatory Visit: Payer: Self-pay | Admitting: Family Medicine

## 2020-01-17 DIAGNOSIS — I10 Essential (primary) hypertension: Secondary | ICD-10-CM

## 2020-01-17 NOTE — Telephone Encounter (Signed)
Requested Prescriptions  Pending Prescriptions Disp Refills  . amLODipine (NORVASC) 10 MG tablet [Pharmacy Med Name: AMLODIPINE BESYLATE 10 MG TAB] 90 tablet 1    Sig: TAKE 1 TABLET BY MOUTH EVERY DAY     Cardiovascular:  Calcium Channel Blockers Passed - 01/17/2020 11:39 AM      Passed - Last BP in normal range    BP Readings from Last 1 Encounters:  01/13/20 135/75         Passed - Valid encounter within last 6 months    Recent Outpatient Visits          4 days ago Right upper quadrant abdominal pain   [facility] at [facility], Ines Bloomer, MD   6 months ago Myalgia   [facility] at [facility], Ranell Patrick, MD   7 months ago Acute pain of both shoulders   [facility] at [facility], Ranell Patrick, MD   11 months ago Atypical chest pain   Primary [facility] at [place], MD   11 months ago Essential hypertension   [facility] at [facility], Ranell Patrick, MD

## 2020-01-28 ENCOUNTER — Telehealth: Payer: Self-pay | Admitting: Family Medicine

## 2020-01-28 NOTE — Telephone Encounter (Signed)
Pt called wondering about the department for his ultrasound. Pt would like a call regarding this. Please advise.

## 2020-02-23 ENCOUNTER — Other Ambulatory Visit: Payer: Self-pay | Admitting: Family Medicine

## 2020-02-23 DIAGNOSIS — M791 Myalgia, unspecified site: Secondary | ICD-10-CM

## 2020-03-27 ENCOUNTER — Other Ambulatory Visit: Payer: Self-pay | Admitting: Family Medicine

## 2020-03-27 DIAGNOSIS — R6889 Other general symptoms and signs: Secondary | ICD-10-CM

## 2020-03-27 DIAGNOSIS — R0989 Other specified symptoms and signs involving the circulatory and respiratory systems: Secondary | ICD-10-CM

## 2020-03-27 NOTE — Telephone Encounter (Signed)
Requested Prescriptions  Pending Prescriptions Disp Refills  . fluticasone (FLONASE) 50 MCG/ACT nasal spray [Pharmacy Med Name: FLUTICASONE PROP 50 MCG SPRAY] 48 mL 2    Sig: USE 1 SPRAY IN BOTH NOSTRILS DAILY     Ear, Nose, and Throat: Nasal Preparations - Corticosteroids Passed - 03/27/2020  8:38 AM      Passed - Valid encounter within last 12 months    Recent Outpatient Visits          2 months ago Right upper quadrant abdominal pain   [facility] at [facility], Ines Bloomer, MD   8 months ago Myalgia   Primary Care at [place], MD   9 months ago Acute pain of both shoulders   [facility] at [facility], Ranell Patrick, MD   1 year ago Atypical chest pain   [facility] at [facility], Ranell Patrick, MD   1 year ago Essential hypertension   [facility] at [facility], Ranell Patrick, MD

## 2020-04-02 ENCOUNTER — Telehealth: Payer: Self-pay | Admitting: Family Medicine

## 2020-04-02 ENCOUNTER — Other Ambulatory Visit: Payer: Self-pay | Admitting: Family Medicine

## 2020-04-02 ENCOUNTER — Ambulatory Visit
Admission: RE | Admit: 2020-04-02 | Discharge: 2020-04-02 | Disposition: A | Discharge status: Home / Self Care | Payer: 59 | Source: Ambulatory Visit | Attending: Emergency Medicine | Admitting: Emergency Medicine

## 2020-04-02 DIAGNOSIS — R1011 Right upper quadrant pain: Secondary | ICD-10-CM

## 2020-04-02 NOTE — Progress Notes (Signed)
I called pt, and passed this message along. Pt will be on the look out for a call from specialist, and will reach out to [place] if no one calls him. Pt knows to go to ER if symptoms become worse. Pt seemed to understand and stated he will comply with these directions.

## 2020-04-02 NOTE — Progress Notes (Unsigned)
  Call received by staff regarding ultrasound today, discussed with Dr. Linna Darner.  He discussed with patient and currently is doing okay, no acute symptoms, has had ongoing right upper quadrant discomfort.  Possible acalculous cholecystitis noted on ultrasound, will order referral to general surgery, hopefully can be seen next week but ER precautions given if any increased abdominal pain, nausea, vomiting, fever or other acute worsening.

## 2020-04-02 NOTE — Telephone Encounter (Signed)
Pt called back for clarification read Dr.Greens note and cma notes/ pt understood

## 2020-04-09 ENCOUNTER — Other Ambulatory Visit: Payer: Self-pay | Admitting: Family Medicine

## 2020-04-09 NOTE — Telephone Encounter (Signed)
Requested medications are due for refill today yes  Requested medications are on the active medication list yes  Last refill 8/10  Future visit scheduled no  Notes to clinic Failed protocol of labs within 360 days, and valid visit within 6 months

## 2020-04-15 ENCOUNTER — Other Ambulatory Visit: Payer: Self-pay | Admitting: Surgery

## 2020-04-15 DIAGNOSIS — R109 Unspecified abdominal pain: Secondary | ICD-10-CM

## 2020-04-16 ENCOUNTER — Other Ambulatory Visit: Payer: Self-pay | Admitting: Family Medicine

## 2020-04-16 DIAGNOSIS — I10 Essential (primary) hypertension: Secondary | ICD-10-CM

## 2020-04-16 NOTE — Telephone Encounter (Signed)
Requested medications are due for refill today yes  Requested medications are on the active medication list yes  Last refill 8/18  Last visit 01/23/2019  Future visit scheduled 04/2020  Notes to clinic Failed protocol of valid visit within 6 months, he does have an appt scheduled later this month.

## 2020-05-04 ENCOUNTER — Ambulatory Visit
Admission: RE | Admit: 2020-05-04 | Discharge: 2020-05-04 | Disposition: A | Discharge status: Home / Self Care | Payer: 59 | Source: Ambulatory Visit | Attending: Surgery | Admitting: Surgery

## 2020-05-04 ENCOUNTER — Other Ambulatory Visit: Payer: Self-pay

## 2020-05-04 DIAGNOSIS — R109 Unspecified abdominal pain: Secondary | ICD-10-CM

## 2020-05-25 ENCOUNTER — Other Ambulatory Visit (HOSPITAL_COMMUNITY): Payer: Self-pay | Admitting: Surgery

## 2020-05-25 DIAGNOSIS — N2889 Other specified disorders of kidney and ureter: Secondary | ICD-10-CM

## 2020-07-04 ENCOUNTER — Other Ambulatory Visit: Payer: Self-pay | Admitting: Family Medicine

## 2020-07-04 DIAGNOSIS — I10 Essential (primary) hypertension: Secondary | ICD-10-CM

## 2020-07-04 NOTE — Telephone Encounter (Signed)
Requested Prescriptions  Pending Prescriptions Disp Refills  . losartan-hydrochlorothiazide (HYZAAR) 100-25 MG tablet [Pharmacy Med Name: LOSARTAN-HCTZ 100-25 MG TAB] 90 tablet 0    Sig: TAKE 1 TABLET BY MOUTH EVERY DAY     Cardiovascular: ARB + Diuretic Combos Failed - 07/04/2020 10:58 AM      Failed - K in normal range and within 180 days    Potassium  Date Value Ref Range Status  01/13/2020 3.4 (L) 3.5 - 5.2 mmol/L Final         Passed - Na in normal range and within 180 days    Sodium  Date Value Ref Range Status  01/13/2020 140 134 - 144 mmol/L Final         Passed - Cr in normal range and within 180 days    Creat  Date Value Ref Range Status  03/28/2016 0.92 0.60 - 1.35 mg/dL Final   Creatinine, Ser  Date Value Ref Range Status  01/13/2020 1.04 0.76 - 1.27 mg/dL Final         Passed - Ca in normal range and within 180 days    Calcium  Date Value Ref Range Status  01/13/2020 9.6 8.7 - 10.2 mg/dL Final   Calcium, Ion  Date Value Ref Range Status  01/19/2016 1.17 1.13 - 1.30 mmol/L Final         Passed - Patient is not pregnant      Passed - Last BP in normal range    BP Readings from Last 1 Encounters:  01/13/20 135/75         Passed - Valid encounter within last 6 months    Recent Outpatient Visits          5 months ago Right upper quadrant abdominal pain   Primary Care at [facility], Ines Bloomer, MD   1 year ago Myalgia   [facility] at [facility], Ranell Patrick, MD   1 year ago Acute pain of both shoulders   [facility] at [facility], Ranell Patrick, MD   1 year ago Atypical chest pain   [facility] at [facility], Ranell Patrick, MD   1 year ago Essential hypertension   Primary Care at [facility], Ranell Patrick, MD

## 2020-08-09 ENCOUNTER — Ambulatory Visit: Payer: 59 | Admitting: Family Medicine

## 2020-08-12 IMAGING — DX PORTABLE CHEST - 1 VIEW
1 series · 1 of 1 positions shown · non-contrast
Comparison: 03/28/2016

CLINICAL DATA: Fever

EXAM:
PORTABLE CHEST 1 VIEW

[chest]
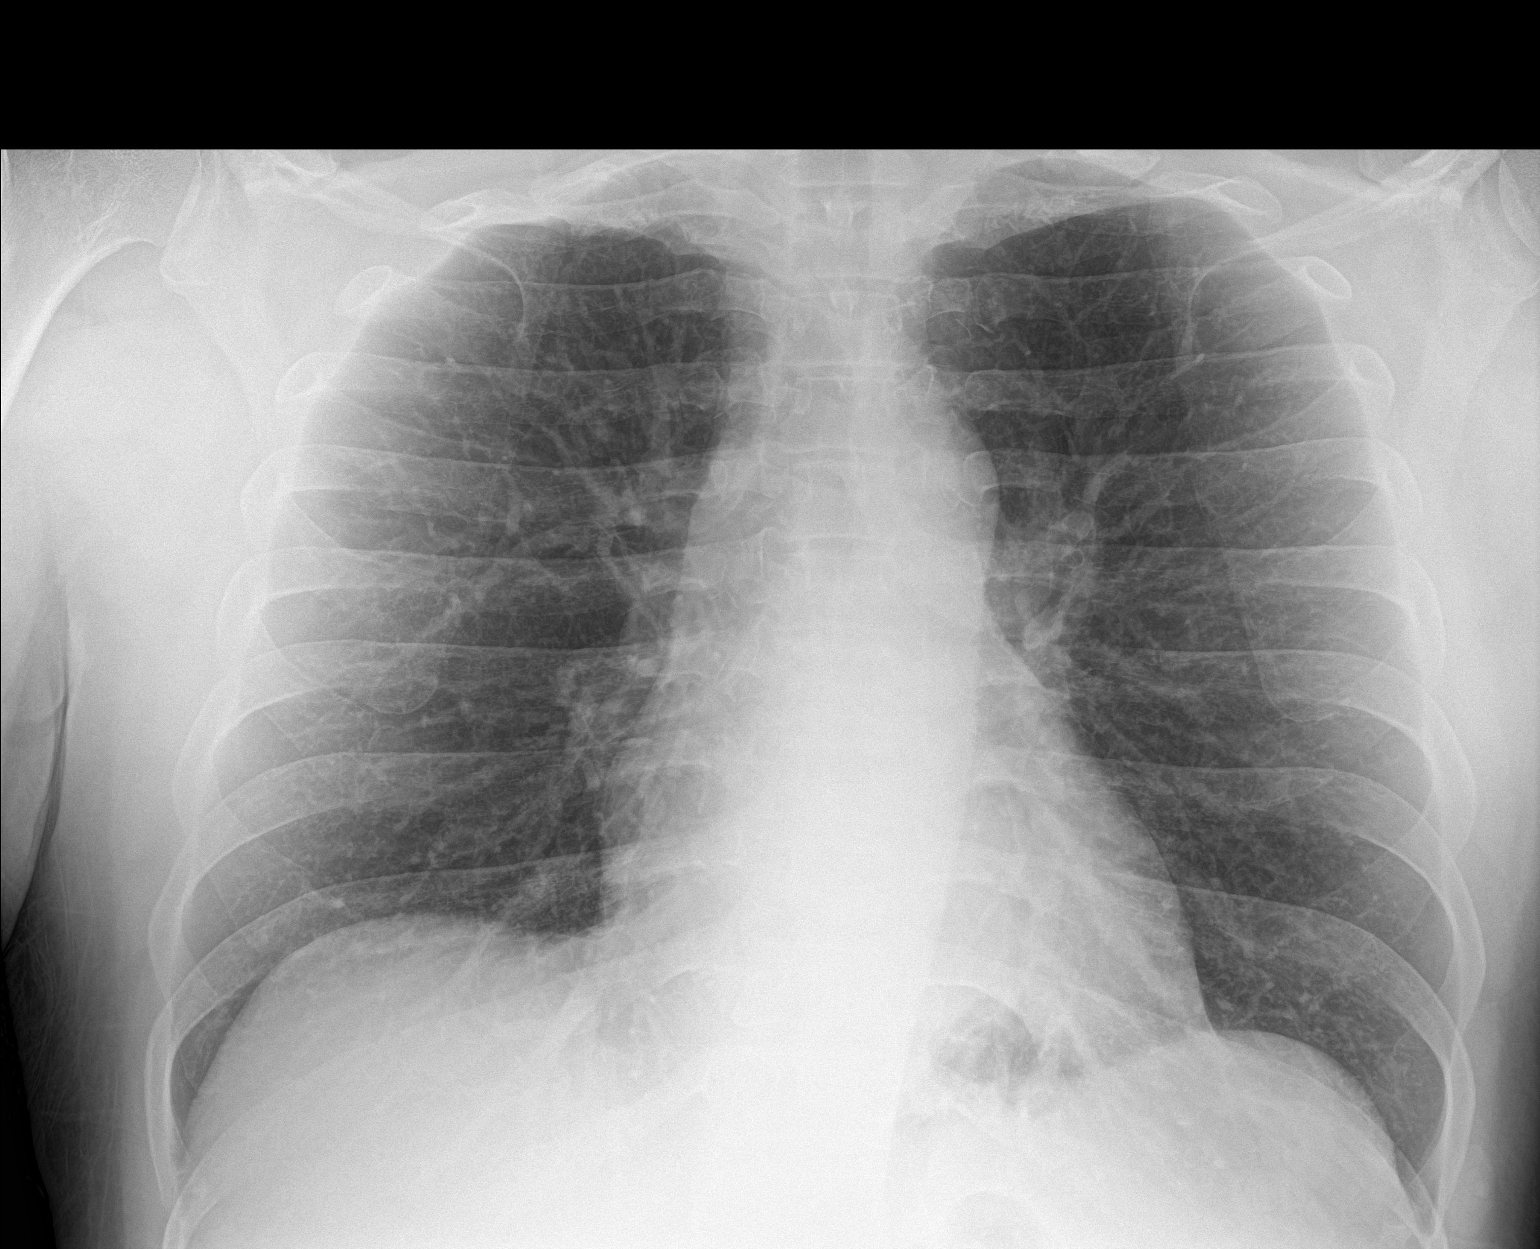

[1 of 1 positions shown; findings below may reference images not displayed]

FINDINGS: Heart and mediastinal contours are within normal limits. No focal
opacities or effusions. No acute bony abnormality.
IMPRESSION: No active disease.

## 2020-08-19 ENCOUNTER — Ambulatory Visit: Payer: 59 | Admitting: Internal Medicine

## 2020-08-19 ENCOUNTER — Encounter: Payer: Self-pay | Admitting: Internal Medicine

## 2020-08-19 ENCOUNTER — Ambulatory Visit (INDEPENDENT_AMBULATORY_CARE_PROVIDER_SITE_OTHER): Payer: 59

## 2020-08-19 ENCOUNTER — Other Ambulatory Visit: Payer: Self-pay

## 2020-08-19 VITALS — BP 134/76 | HR 81 | Temp 98.2°F | Resp 16 | Ht 74.0 in | Wt 250.4 lb

## 2020-08-19 DIAGNOSIS — E876 Hypokalemia: Secondary | ICD-10-CM | POA: Diagnosis not present

## 2020-08-19 DIAGNOSIS — G8929 Other chronic pain: Secondary | ICD-10-CM

## 2020-08-19 DIAGNOSIS — M545 Low back pain, unspecified: Secondary | ICD-10-CM

## 2020-08-19 DIAGNOSIS — Z0000 Encounter for general adult medical examination without abnormal findings: Secondary | ICD-10-CM | POA: Diagnosis not present

## 2020-08-19 DIAGNOSIS — I10 Essential (primary) hypertension: Secondary | ICD-10-CM

## 2020-08-19 DIAGNOSIS — Z0001 Encounter for general adult medical examination with abnormal findings: Secondary | ICD-10-CM | POA: Insufficient documentation

## 2020-08-19 DIAGNOSIS — R7303 Prediabetes: Secondary | ICD-10-CM

## 2020-08-19 DIAGNOSIS — E785 Hyperlipidemia, unspecified: Secondary | ICD-10-CM

## 2020-08-19 DIAGNOSIS — Z1159 Encounter for screening for other viral diseases: Secondary | ICD-10-CM

## 2020-08-19 DIAGNOSIS — Z1211 Encounter for screening for malignant neoplasm of colon: Secondary | ICD-10-CM | POA: Insufficient documentation

## 2020-08-19 DIAGNOSIS — T502X5A Adverse effect of carbonic-anhydrase inhibitors, benzothiadiazides and other diuretics, initial encounter: Secondary | ICD-10-CM

## 2020-08-19 HISTORY — DX: Low back pain, unspecified: M54.50

## 2020-08-19 HISTORY — DX: Prediabetes: R73.03

## 2020-08-19 HISTORY — DX: Other chronic pain: G89.29

## 2020-08-19 LAB — CBC WITH DIFFERENTIAL/PLATELET
Basophils Absolute: 0.1 10*3/uL (ref 0.0–0.1)
Basophils Relative: 2.3 % (ref 0.0–3.0)
Eosinophils Absolute: 0.3 10*3/uL (ref 0.0–0.7)
Eosinophils Relative: 4.7 % (ref 0.0–5.0)
HCT: 41.7 % (ref 39.0–52.0)
Hemoglobin: 14 g/dL (ref 13.0–17.0)
Lymphocytes Relative: 40.2 % (ref 12.0–46.0)
Lymphs Abs: 2.2 10*3/uL (ref 0.7–4.0)
MCHC: 33.6 g/dL (ref 30.0–36.0)
MCV: 83 fl (ref 78.0–100.0)
Monocytes Absolute: 0.8 10*3/uL (ref 0.1–1.0)
Monocytes Relative: 15.1 % — ABNORMAL HIGH (ref 3.0–12.0)
Neutro Abs: 2.1 10*3/uL (ref 1.4–7.7)
Neutrophils Relative %: 37.7 % — ABNORMAL LOW (ref 43.0–77.0)
Platelets: 297 10*3/uL (ref 150.0–400.0)
RBC: 5.03 Mil/uL (ref 4.22–5.81)
RDW: 14.4 % (ref 11.5–15.5)
WBC: 5.5 10*3/uL (ref 4.0–10.5)

## 2020-08-19 LAB — HEPATIC FUNCTION PANEL
ALT: 15 U/L (ref 0–53)
AST: 16 U/L (ref 0–37)
Albumin: 4.3 g/dL (ref 3.5–5.2)
Alkaline Phosphatase: 50 U/L (ref 39–117)
Bilirubin, Direct: 0.1 mg/dL (ref 0.0–0.3)
Total Bilirubin: 0.4 mg/dL (ref 0.2–1.2)
Total Protein: 7.3 g/dL (ref 6.0–8.3)

## 2020-08-19 LAB — URINALYSIS, ROUTINE W REFLEX MICROSCOPIC
Bilirubin Urine: NEGATIVE
Hgb urine dipstick: NEGATIVE
Ketones, ur: NEGATIVE
Leukocytes,Ua: NEGATIVE
Nitrite: NEGATIVE
RBC / HPF: NONE SEEN (ref 0–?)
Specific Gravity, Urine: 1.01 (ref 1.000–1.030)
Total Protein, Urine: NEGATIVE
Urine Glucose: NEGATIVE
Urobilinogen, UA: 0.2 (ref 0.0–1.0)
WBC, UA: NONE SEEN (ref 0–?)
pH: 7.5 (ref 5.0–8.0)

## 2020-08-19 LAB — BASIC METABOLIC PANEL
BUN: 15 mg/dL (ref 6–23)
CO2: 27 mEq/L (ref 19–32)
Calcium: 9.3 mg/dL (ref 8.4–10.5)
Chloride: 104 mEq/L (ref 96–112)
Creatinine, Ser: 0.83 mg/dL (ref 0.40–1.50)
GFR: 100.89 mL/min (ref >60.00)
Glucose, Bld: 97 mg/dL (ref 70–99)
Potassium: 3.7 mEq/L (ref 3.5–5.1)
Sodium: 140 mEq/L (ref 135–145)

## 2020-08-19 LAB — MAGNESIUM: Magnesium: 1.9 mg/dL (ref 1.5–2.5)

## 2020-08-19 LAB — LIPID PANEL
Cholesterol: 174 mg/dL (ref 0–200)
HDL: 44.9 mg/dL (ref >39.00)
LDL Cholesterol: 95 mg/dL (ref 0–99)
NonHDL: 129.49
Total CHOL/HDL Ratio: 4
Triglycerides: 170 mg/dL — ABNORMAL HIGH (ref 0.0–149.0)
VLDL: 34 mg/dL (ref 0.0–40.0)

## 2020-08-19 LAB — TSH: TSH: 1.97 u[IU]/mL (ref 0.35–4.50)

## 2020-08-19 LAB — PSA: PSA: 0.28 ng/mL (ref 0.10–4.00)

## 2020-08-19 MED ORDER — ETODOLAC ER 400 MG PO TB24: 400.0000 mg | ORAL_TABLET | Freq: Every day | ORAL | 0 refills | Status: DC

## 2020-08-19 NOTE — Patient Instructions (Signed)

## 2020-08-19 NOTE — Progress Notes (Signed)
Subjective:  Patient ID: Donald [name], male    DOB: 18-Jun-1970  Age: 50 y.o. MRN: [id number]  CC: New Patient (Initial Visit) (Pt states he been having pain on both (L) sides), Hypertension, and Back Pain  This visit occurred during the SARS-CoV-2 public health emergency.  Safety protocols were in place, including screening questions prior to the visit, additional usage of staff PPE, and extensive cleaning of exam room while observing appropriate contact time as indicated for disinfecting solutions.    HPI Donald [name] presents for a CPX and to establish.  He complains of a 1 month history of low back pain and bilateral flank pain.  He has gotten some symptom relief with Tylenol.  He also complains of numbness, weakness, and tingling in his lower extremities.  He underwent a CT scan of the abdomen and pelvis about 3 months ago for the flank pain and it was unremarkable.  He denies trauma or injury.  History Donald [name] has a past medical history of DEPRESSION, MILD, Dysuria, G E R D, Hypercholesterolemia, Hyperlipidemia, Hypertension, Lumbago, NEPHROLITHIASIS, HX OF, PARASOMNIA, and Right bundle branch block.   He has a past surgical history that includes right and left hand; Hand surgery; and Cardiac catheterization (N/A, 01/19/2016).   His family history includes Hypertension in his mother and sister.He reports that he has never smoked. He has never used smokeless tobacco. He reports that he does not drink alcohol and does not use drugs.  Outpatient Medications Prior to Visit  Medication Sig Dispense Refill   aspirin 81 MG tablet Take 1 tablet (81 mg total) by mouth daily. 30 tablet 5   amLODipine (NORVASC) 10 MG tablet TAKE 1 TABLET BY MOUTH EVERY DAY 90 tablet 1   atorvastatin (LIPITOR) 10 MG tablet TAKE 1 TABLET BY MOUTH EVERY DAY 90 tablet 1   losartan-hydrochlorothiazide (HYZAAR) 100-25 MG tablet TAKE 1 TABLET BY MOUTH EVERY DAY 90 tablet 0   fluticasone (FLONASE) 50  MCG/ACT nasal spray USE 1 SPRAY IN BOTH NOSTRILS DAILY (Patient not taking: Reported on 08/19/2020) 48 mL 2   cyclobenzaprine (FLEXERIL) 5 MG tablet Take 1 tablet (5 mg total) by mouth 3 (three) times daily as needed for muscle spasms (start qhs prn due to sedation). (Patient not taking: Reported on 08/19/2020) 15 tablet 1   gabapentin (NEURONTIN) 300 MG capsule Take 1 capsule (300 mg total) by mouth 3 (three) times daily. (Patient not taking: Reported on 08/19/2020) 30 capsule 0   meloxicam (MOBIC) 7.5 MG tablet TAKE 1 TABLET BY MOUTH EVERY DAY (Patient not taking: Reported on 08/19/2020) 90 tablet 0   phenazopyridine (PYRIDIUM) 200 MG tablet Take 1 tablet (200 mg total) by mouth 3 (three) times daily. (Patient not taking: Reported on 08/19/2020) 6 tablet 0   No facility-administered medications prior to visit.    ROS Review of Systems  Constitutional: Negative.  Negative for appetite change, chills, diaphoresis, fatigue and fever.  HENT: Negative.  Negative for sore throat and trouble swallowing.   Eyes: Negative.   Respiratory: Negative for cough, chest tightness, shortness of breath and wheezing.   Cardiovascular: Negative for chest pain, palpitations and leg swelling.  Gastrointestinal: Negative for abdominal pain, constipation, diarrhea, nausea and vomiting.  Endocrine: Negative.   Genitourinary: Positive for flank pain. Negative for difficulty urinating, hematuria, penile swelling, scrotal swelling and urgency.  Musculoskeletal: Positive for back pain. Negative for arthralgias, myalgias and neck pain.  Skin: Negative.  Negative for color change and rash.  Neurological: Positive for weakness  and numbness. Negative for dizziness and light-headedness.  Hematological: Negative for adenopathy. Does not bruise/bleed easily.  Psychiatric/Behavioral: Negative.     Objective:  BP 134/76 (BP Location: Right Arm, Patient Position: Sitting, Cuff Size: Large)    Pulse 81    Temp 98.2 F (36.8 C)  (Oral)    Resp 16    Ht 6\' 2"  (1.88 m)    Wt 250 lb 6.4 oz (113.6 kg)    SpO2 97%    BMI 32.15 kg/m   Physical Exam Vitals reviewed.  Constitutional:      Appearance: Normal appearance.  HENT:     Nose: Nose normal.     Mouth/Throat:     Mouth: Mucous membranes are moist.  Eyes:     General: No scleral icterus.    Conjunctiva/sclera: Conjunctivae normal.  Cardiovascular:     Rate and Rhythm: Normal rate and regular rhythm.     Heart sounds: No murmur heard.   Pulmonary:     Effort: Pulmonary effort is normal.     Breath sounds: No stridor. No wheezing, rhonchi or rales.  Abdominal:     General: Abdomen is protuberant. Bowel sounds are normal.     Palpations: Abdomen is soft. There is no hepatomegaly, splenomegaly or mass.     Tenderness: There is no abdominal tenderness. There is no right CVA tenderness, left CVA tenderness or guarding.     Hernia: No hernia is present. There is no hernia in the left inguinal area or right inguinal area.  Genitourinary:    Pubic Area: No rash.      Penis: Normal and circumcised.      Testes: Normal.     Epididymis:     Right: Normal.     Left: Normal.     Prostate: Normal. Not enlarged, not tender and no nodules present.     Rectum: Normal. Guaiac result negative. No mass, tenderness, anal fissure, external hemorrhoid or internal hemorrhoid. Normal anal tone.  Musculoskeletal:     Cervical back: Neck supple.     Thoracic back: Normal.     Lumbar back: Normal.  Lymphadenopathy:     Cervical: No cervical adenopathy.     Lower Body: No right inguinal adenopathy. No left inguinal adenopathy.  Neurological:     Mental Status: He is alert.     Lab Results  Component Value Date   WBC 5.5 08/19/2020   HGB 14.0 08/19/2020   HCT 41.7 08/19/2020   PLT 297.0 08/19/2020   GLUCOSE 97 08/19/2020   CHOL 174 08/19/2020   TRIG 170.0 (H) 08/19/2020   HDL 44.90 08/19/2020   LDLCALC 95 08/19/2020   ALT 15 08/19/2020   AST 16 08/19/2020   NA  140 08/19/2020   K 3.7 08/19/2020   CL 104 08/19/2020   CREATININE 0.83 08/19/2020   BUN 15 08/19/2020   CO2 27 08/19/2020   TSH 1.97 08/19/2020   PSA 0.28 08/19/2020   INR 1.05 01/19/2016   HGBA1C 5.8 08/19/2020   No results found.  Assessment & Plan:   Kyon was seen today for new patient (initial visit), hypertension and back pain.  Diagnoses and all orders for this visit:  Encounter for general adult medical examination with abnormal findings- Exam completed, labs reviewed, cancer screenings addressed, vaccines reviewed, patient education was given. -     Lipid panel; Future -     PSA; Future -     Hepatitis C antibody; Future -     Hepatitis  C antibody -     PSA -     Lipid panel  Prediabetes- His A1c is at 5.8%.  Medical therapy is not indicated. -     Basic metabolic panel; Future -     Hemoglobin A1c; Future -     Hemoglobin A1c -     Basic metabolic panel  Screen for colon cancer -     Ambulatory referral to Gastroenterology  Diuretic-induced hypokalemia- His K+ is normal now. -     Basic metabolic panel; Future -     Magnesium; Future -     Magnesium -     Basic metabolic panel  Essential hypertension- His BP is well controlled. -     CBC with Differential/Platelet; Future -     Basic metabolic panel; Future -     TSH; Future -     Urinalysis, Routine w reflex microscopic; Future -     Hepatic function panel; Future -     Magnesium; Future -     Magnesium -     Hepatic function panel -     Urinalysis, Routine w reflex microscopic -     TSH -     Basic metabolic panel -     CBC with Differential/Platelet -     amLODipine (NORVASC) 10 MG tablet; Take 1 tablet (10 mg total) by mouth daily. -     losartan-hydrochlorothiazide (HYZAAR) 100-25 MG tablet; Take 1 tablet by mouth daily.  Chronic bilateral low back pain without sciatica - His exam and plain films are unremarkable.  I do not have a good explanation for his back pain.  I recommended that he  start taking an anti-inflammatory and to see sports medicine. -     DG Lumbar Spine Complete; Future -     etodolac (LODINE XL) 400 MG 24 hr tablet; Take 1 tablet (400 mg total) by mouth daily. -     Ambulatory referral to Sports Medicine  Need for hepatitis C screening test   I have discontinued Kaimen Schroepfer's cyclobenzaprine, gabapentin, phenazopyridine, and meloxicam. I have also changed his amLODipine, losartan-hydrochlorothiazide, and atorvastatin. Additionally, I am having him start on etodolac. Lastly, I am having him maintain his aspirin and fluticasone.  Meds ordered this encounter  Medications   etodolac (LODINE XL) 400 MG 24 hr tablet    Sig: Take 1 tablet (400 mg total) by mouth daily.    Dispense:  90 tablet    Refill:  0   amLODipine (NORVASC) 10 MG tablet    Sig: Take 1 tablet (10 mg total) by mouth daily.    Dispense:  90 tablet    Refill:  1   losartan-hydrochlorothiazide (HYZAAR) 100-25 MG tablet    Sig: Take 1 tablet by mouth daily.    Dispense:  90 tablet    Refill:  1   atorvastatin (LIPITOR) 10 MG tablet    Sig: Take 1 tablet (10 mg total) by mouth daily.    Dispense:  90 tablet    Refill:  1     Follow-up: Return in about 6 weeks (around 09/30/2020).  Scarlette Calico, MD

## 2020-08-20 LAB — HEPATITIS C ANTIBODY
Hepatitis C Ab: NONREACTIVE
SIGNAL TO CUT-OFF: 0.01 (ref <1.00)

## 2020-08-20 LAB — HEMOGLOBIN A1C: Hgb A1c MFr Bld: 5.8 % (ref 4.6–6.5)

## 2020-08-25 DIAGNOSIS — E785 Hyperlipidemia, unspecified: Secondary | ICD-10-CM | POA: Insufficient documentation

## 2020-08-25 MED ORDER — LOSARTAN POTASSIUM-HCTZ 100-25 MG PO TABS: 1.0000 | ORAL_TABLET | Freq: Every day | ORAL | 1 refills | Status: DC

## 2020-08-25 MED ORDER — ATORVASTATIN CALCIUM 10 MG PO TABS: 10.0000 mg | ORAL_TABLET | Freq: Every day | ORAL | 1 refills | Status: DC

## 2020-08-25 MED ORDER — AMLODIPINE BESYLATE 10 MG PO TABS: 10.0000 mg | ORAL_TABLET | Freq: Every day | ORAL | 1 refills | Status: DC

## 2020-08-25 NOTE — Assessment & Plan Note (Signed)
LDL goal achieved. Doing well on the statin

## 2020-09-01 ENCOUNTER — Encounter (HOSPITAL_COMMUNITY): Payer: Self-pay

## 2020-09-01 ENCOUNTER — Emergency Department (HOSPITAL_COMMUNITY)
Admission: EM | Admit: 2020-09-01 | Discharge: 2020-09-02 | Disposition: A | Discharge status: Home / Self Care | Payer: 59 | Attending: Emergency Medicine | Admitting: Emergency Medicine

## 2020-09-01 DIAGNOSIS — K219 Gastro-esophageal reflux disease without esophagitis: Secondary | ICD-10-CM | POA: Insufficient documentation

## 2020-09-01 DIAGNOSIS — I10 Essential (primary) hypertension: Secondary | ICD-10-CM | POA: Diagnosis not present

## 2020-09-01 DIAGNOSIS — Z7982 Long term (current) use of aspirin: Secondary | ICD-10-CM | POA: Diagnosis not present

## 2020-09-01 DIAGNOSIS — Z79899 Other long term (current) drug therapy: Secondary | ICD-10-CM | POA: Diagnosis not present

## 2020-09-01 DIAGNOSIS — R1011 Right upper quadrant pain: Secondary | ICD-10-CM | POA: Insufficient documentation

## 2020-09-01 LAB — URINALYSIS, ROUTINE W REFLEX MICROSCOPIC
Bilirubin Urine: NEGATIVE
Glucose, UA: NEGATIVE mg/dL
Hgb urine dipstick: NEGATIVE
Ketones, ur: NEGATIVE mg/dL
Leukocytes,Ua: NEGATIVE
Nitrite: NEGATIVE
Protein, ur: NEGATIVE mg/dL
Specific Gravity, Urine: 1.016 (ref 1.005–1.030)
pH: 6 (ref 5.0–8.0)

## 2020-09-01 LAB — COMPREHENSIVE METABOLIC PANEL
ALT: 16 U/L (ref 0–44)
AST: 22 U/L (ref 15–41)
Albumin: 4 g/dL (ref 3.5–5.0)
Alkaline Phosphatase: 43 U/L (ref 38–126)
Anion gap: 7 (ref 5–15)
BUN: 16 mg/dL (ref 6–20)
CO2: 27 mmol/L (ref 22–32)
Calcium: 8.9 mg/dL (ref 8.9–10.3)
Chloride: 102 mmol/L (ref 98–111)
Creatinine, Ser: 0.99 mg/dL (ref 0.61–1.24)
GFR, Estimated: >60 mL/min (ref >60)
Glucose, Bld: 109 mg/dL — ABNORMAL HIGH (ref 70–99)
Potassium: 3.1 mmol/L — ABNORMAL LOW (ref 3.5–5.1)
Sodium: 136 mmol/L (ref 135–145)
Total Bilirubin: 0.9 mg/dL (ref 0.3–1.2)
Total Protein: 7 g/dL (ref 6.5–8.1)

## 2020-09-01 LAB — CBC
HCT: 41.4 % (ref 39.0–52.0)
Hemoglobin: 13.4 g/dL (ref 13.0–17.0)
MCH: 28 pg (ref 26.0–34.0)
MCHC: 32.4 g/dL (ref 30.0–36.0)
MCV: 86.4 fL (ref 80.0–100.0)
Platelets: 303 10*3/uL (ref 150–400)
RBC: 4.79 MIL/uL (ref 4.22–5.81)
RDW: 13.9 % (ref 11.5–15.5)
WBC: 7.9 10*3/uL (ref 4.0–10.5)
nRBC: 0 % (ref 0.0–0.2)

## 2020-09-01 LAB — LIPASE, BLOOD: Lipase: 37 U/L (ref 11–51)

## 2020-09-01 NOTE — ED Triage Notes (Signed)
Pt reports RUQ abd pain and upper back pain started about a week. Pt denies any chest pain, N/V/D, SOB. Pt states no relief with tylenol use

## 2020-09-01 NOTE — ED Provider Notes (Signed)
[facility] EMERGENCY DEPARTMENT Provider Note   CSN: [id number] Arrival date & time: 09/01/20  2049     History Chief Complaint  Patient presents with  . Abdominal Pain    Donald [name] is a 50 y.o. male.  The history is provided by the patient and medical records.  Abdominal Pain   50 year old male with history of depression, GERD, hyperlipidemia, hypertension, history of kidney stones, presenting to the ED for right upper abdominal pain.  States he has this on and off for the past 2 weeks or so.  States he did have some similar issues at the end of 2021 but had been asymptomatic for quite some time.  He has not noticed any alleviating or exacerbating factors, specifically does not seem any worse after eating or drinking.  He has not had any nausea or vomiting.  No fever or chills.  Past Medical History:  Diagnosis Date  . DEPRESSION, MILD   . Dysuria   . G E R D   . Hypercholesterolemia   . Hyperlipidemia    NOT TAKING MEDICATION  . Hypertension   . Lumbago   . NEPHROLITHIASIS, HX OF   . PARASOMNIA   . Right bundle branch block     Patient Active Problem List   Diagnosis Date Noted  . Dyslipidemia, goal LDL below 130 08/25/2020  . Encounter for general adult medical examination with abnormal findings 08/19/2020  . Prediabetes 08/19/2020  . Screen for colon cancer 08/19/2020  . Diuretic-induced hypokalemia 08/19/2020  . Chronic bilateral low back pain without sciatica 08/19/2020  . Right bundle branch block 03/11/2010  . Essential hypertension 05/14/2009  . PARASOMNIA 05/14/2009  . G E R D 05/13/2009    Past Surgical History:  Procedure Laterality Date  . CARDIAC CATHETERIZATION N/A 01/19/2016   Procedure: Left Heart Cath and Coronary Angiography;  Surgeon: Sherren Mocha, MD;  Location: [place] [facility];  Service: Cardiovascular;  Laterality: N/A;  . HAND SURGERY    . right and left hand         Family History  Problem Relation  Age of Onset  . Hypertension Mother   . Hypertension Sister     Social History   Tobacco Use  . Smoking status: Never Smoker  . Smokeless tobacco: Never Used  Substance Use Topics  . Alcohol use: No    Alcohol/week: 0.0 standard drinks  . Drug use: No    Home Medications Prior to Admission medications   Medication Sig Start Date End Date Taking? Authorizing Provider  amLODipine (NORVASC) 10 MG tablet Take 1 tablet (10 mg total) by mouth daily. 08/25/20   Janith Lima, MD  aspirin 81 MG tablet Take 1 tablet (81 mg total) by mouth daily. 10/29/15   Jaynee Eagles, PA-C  atorvastatin (LIPITOR) 10 MG tablet Take 1 tablet (10 mg total) by mouth daily. 08/25/20   Janith Lima, MD  etodolac (LODINE XL) 400 MG 24 hr tablet Take 1 tablet (400 mg total) by mouth daily. 08/19/20   Janith Lima, MD  fluticasone Asencion Islam) 50 MCG/ACT nasal spray USE 1 SPRAY IN BOTH NOSTRILS DAILY Patient not taking: Reported on 08/19/2020 03/27/20   [name], MD  losartan-hydrochlorothiazide (HYZAAR) 100-25 MG tablet Take 1 tablet by mouth daily. 08/25/20   Janith Lima, MD    Allergies    Ace inhibitors  Review of Systems   Review of Systems  Gastrointestinal: Positive for abdominal pain.  All other systems  reviewed and are negative.   Physical Exam Updated Vital Signs BP (!) 146/79 (BP Location: Left Arm)   Pulse 76   Temp 98.6 F (37 C) (Oral)   Resp 16   SpO2 100%   Physical Exam Vitals and nursing note reviewed.  Constitutional:      Appearance: He is well-developed.  HENT:     Head: Normocephalic and atraumatic.  Eyes:     Conjunctiva/sclera: Conjunctivae normal.     Pupils: Pupils are equal, round, and reactive to light.  Cardiovascular:     Rate and Rhythm: Normal rate and regular rhythm.     Heart sounds: Normal heart sounds.  Pulmonary:     Effort: Pulmonary effort is normal.     Breath sounds: Normal breath sounds.  Abdominal:     General: Bowel sounds are  normal.     Palpations: Abdomen is soft.     Tenderness: There is abdominal tenderness.     Comments: Minimal discomfort to RUQ, no murphys sign, abdomen soft, no distention  Musculoskeletal:        General: Normal range of motion.     Cervical back: Normal range of motion.  Skin:    General: Skin is warm and dry.  Neurological:     Mental Status: He is alert and oriented to person, place, and time.     ED Results / Procedures / Treatments   Labs (all labs ordered are listed, but only abnormal results are displayed) Labs Reviewed  COMPREHENSIVE METABOLIC PANEL - Abnormal; Notable for the following components:      Result Value   Potassium 3.1 (*)    Glucose, Bld 109 (*)    All other components within normal limits  LIPASE, BLOOD  CBC  URINALYSIS, ROUTINE W REFLEX MICROSCOPIC    EKG None  Radiology US Abdomen Limited RUQ (LIVER/GB)  Result Date: 09/02/2020 CLINICAL DATA:  Right upper quadrant pain. EXAM: ULTRASOUND ABDOMEN LIMITED RIGHT UPPER QUADRANT COMPARISON:  CT abdomen pelvis 05/04/2020 FINDINGS: Gallbladder: Contracted gallbladder. No gallstones or wall thickening visualized. No sonographic Murphy sign noted by sonographer. Common bile duct: Diameter: 3 mm. Liver: No focal lesion identified. Within normal limits in parenchymal echogenicity. Portal vein is patent on color Doppler imaging with normal direction of blood flow towards the liver. Other: None. IMPRESSION: Unremarkable right upper quadrant ultrasound; however, slightly limited evaluation due to contracted gallbladder. Electronically Signed   By: Iven Finn M.D.   On: 09/02/2020 00:32    Procedures Procedures   Medications Ordered in ED Medications - No data to display  ED Course  I have reviewed the triage vital signs and the nursing notes.  Pertinent labs & imaging results that were available during my care of the patient were reviewed by me and considered in my medical decision making (see chart for  details).    MDM Rules/Calculators/A&P  50 year old male here with about 2 weeks of intermittent right upper quadrant abdominal pain.  History of similar in late 2021, was referred to general surgery but never followed up.  He is afebrile and nontoxic.  Minimal tenderness in the right upper quadrant without Murphy sign.  Remainder of abdomen soft and nontender.  Labs are grossly reassuring.  Ultrasound obtained without any acute findings, does have noted contracted gallbladder.  Patient has remained overall comfortable here, has not required pain medication.  Will refer back to general surgery, may ultimately need HIDA scan given persistence of symptoms over the past few months.  Close follow-up  with PCP as well.  Return here for new concerns.  Final Clinical Impression(s) / ED Diagnoses Final diagnoses:  RUQ pain    Rx / DC Orders ED Discharge Orders         Ordered    oxyCODONE-acetaminophen (PERCOCET) 5-325 MG tablet  Every 4 hours PRN        09/02/20 0153           Larene Pickett, PA-C 09/02/20 0216    Veryl Speak, MD 09/02/20 0700

## 2020-09-02 ENCOUNTER — Emergency Department (HOSPITAL_COMMUNITY): Payer: 59

## 2020-09-02 MED ORDER — OXYCODONE-ACETAMINOPHEN 5-325 MG PO TABS: 1.0000 | ORAL_TABLET | ORAL | 0 refills | Status: DC | PRN

## 2020-09-02 NOTE — Discharge Instructions (Signed)
Take the prescribed medication as directed.  Do not drive while taking this as it can make you sleepy/drowsy. Follow-up with surgery clinic-- I would call in the morning to get this scheduled. Return to the ED for new or worsening symptoms.

## 2020-09-02 NOTE — ED Notes (Signed)
Patient verbalized understanding of discharge instructions. Opportunity for questions and answers.  

## 2020-09-22 NOTE — Progress Notes (Signed)
CARDIOLOGY CONSULT NOTE       Patient ID: Donald [name] MRN: [id number] DOB/AGE: 1971-02-19 50 y.o.  Admit date: (Not on file) Referring Physician: Carlota Raspberry Primary Physician: Janith Lima, MD Primary Cardiologist: Johnsie Cancel   HPI:  50 y.o. first seen January 2021 for chest pain.  History of GERD, HLD, HTN and RBBB. Pain atypical. 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 5 children at home   Myovue done 07/11/19 normal no ischemia EF 65%   Seen in ED 09/01/20 for abdominal pain Needs f/u MRI for renal lesion RUQ [place] negative and has non obstructive renal calculi Labs ok   It was apparent that he had not planned on getting f/u MRI Long discussion with him About urgent need for it to r/o cancer. It has already been ordered by Michaelle Birks MD  No cardiac issues Compliant with meds     ROS All other systems reviewed and negative except as noted above  Past Medical History:  Diagnosis Date  . DEPRESSION, MILD   . Dysuria   . G E R D   . Hypercholesterolemia   . Hyperlipidemia    NOT TAKING MEDICATION  . Hypertension   . Lumbago   . NEPHROLITHIASIS, HX OF   . PARASOMNIA   . Right bundle branch block     Family History  Problem Relation Age of Onset  . Hypertension Mother   . Hypertension Sister     Social History   Socioeconomic History  . Marital status: Married    Spouse name: Not on file  . Number of children: 2  . Years of education: 55  . Highest education level: Not on file  Occupational History  . Occupation: Glass blower/designer    Employer: [employer]  Tobacco Use  . Smoking status: Never Smoker  . Smokeless tobacco: Never Used  Substance and Sexual Activity  . Alcohol use: No    Alcohol/week: 0.0 standard drinks  . Drug use: No  . Sexual activity: Not on file  Other Topics Concern  . Not on file   Social History Narrative   ** Merged History Encounter **       Social Determinants of Health   Financial Resource Strain: Not on file  Food Insecurity: Not on file  Transportation Needs: Not on file  Physical Activity: Not on file  Stress: Not on file  Social Connections: Not on file  Intimate Partner Violence: Not on file    Past Surgical History:  Procedure Laterality Date  . CARDIAC CATHETERIZATION N/A 01/19/2016   Procedure: Left Heart Cath and Coronary Angiography;  Surgeon: Sherren Mocha, MD;  Location: [place] [facility];  Service: Cardiovascular;  Laterality: N/A;  . HAND SURGERY    . right and left hand        Current Outpatient Medications:  .  amLODipine (NORVASC) 10 MG tablet, Take 1 tablet (10 mg total) by mouth daily., Disp: 90 tablet, Rfl: 1 .  aspirin 81 MG tablet, Take 1 tablet (81 mg total) by mouth daily., Disp: 30 tablet, Rfl: 5 .  atorvastatin (LIPITOR) 10 MG tablet, Take 1 tablet (10 mg total) by mouth daily., Disp: 90 tablet, Rfl: 1 .  etodolac (LODINE XL) 400 MG 24 hr tablet, Take 1 tablet (400 mg total) by mouth daily., Disp: 90 tablet,  Rfl: 0 .  fluticasone (FLONASE) 50 MCG/ACT nasal spray, USE 1 SPRAY IN BOTH NOSTRILS DAILY, Disp: 48 mL, Rfl: 2 .  hydrochlorothiazide (HYDRODIURIL) 25 MG tablet, TAKE 1 TABLET EVERY DAY, Disp: 90 tablet, Rfl: 1 .  losartan (COZAAR) 100 MG tablet, TAKE 1 TABLET EVERY DAY, Disp: 90 tablet, Rfl: 1 .  oxyCODONE-acetaminophen (PERCOCET) 5-325 MG tablet, Take 1 tablet by mouth every 4 (four) hours as needed., Disp: 20 tablet, Rfl: 0    Physical Exam: Blood pressure 124/78, pulse 78, height 6\' 2"  (1.88 m), weight 111.9 kg, SpO2 99 %.    Affect appropriate Healthy:  appears stated age 23: normal Neck supple with no adenopathy JVP normal no bruits no thyromegaly Lungs clear with no wheezing and good diaphragmatic motion Heart:  S1/S2 no murmur, no rub, gallop or click PMI normal Abdomen: benighn, BS positve, no  tenderness, no AAA no bruit.  No HSM or HJR Distal pulses intact with no bruits No edema Neuro non-focal Skin warm and dry No muscular weakness    Labs:   Lab Results  Component Value Date   WBC 7.9 09/01/2020   HGB 13.4 09/01/2020   HCT 41.4 09/01/2020   MCV 86.4 09/01/2020   PLT 303 09/01/2020   No results for input(s): NA, K, CL, CO2, BUN, CREATININE, CALCIUM, PROT, BILITOT, ALKPHOS, ALT, AST, GLUCOSE in the last 168 hours.  Invalid input(s): LABALBU Lab Results  Component Value Date   CKTOTAL 191 06/18/2019   CKMB 3.6 01/19/2016   TROPONINI <0.03 09/23/2018    Lab Results  Component Value Date   CHOL 174 08/19/2020   CHOL 220 (H) 01/15/2019   CHOL 204 (H) 02/28/2018   Lab Results  Component Value Date   HDL 44.90 08/19/2020   HDL 46 01/15/2019   HDL 44 02/28/2018   Lab Results  Component Value Date   LDLCALC 95 08/19/2020   LDLCALC 156 (H) 01/15/2019   LDLCALC 144 (H) 02/28/2018   Lab Results  Component Value Date   TRIG 170.0 (H) 08/19/2020   TRIG 89 01/15/2019   TRIG 80 02/28/2018   Lab Results  Component Value Date   CHOLHDL 4 08/19/2020   CHOLHDL 4.8 01/15/2019   CHOLHDL 4.6 02/28/2018   No results found for: LDLDIRECT    Radiology: No results found.  EKG: NSR rate 83 normal 06/29/19   ASSESSMENT AND PLAN:    1. Chest Pain:  Atypical normal myovue 07/11/19 40% RAMUS at cath in 2017 continue medical Rx  2. HTN:  Well controlled continue low sodium DASH diet and current meds  3. HLD:  On statin labs with primary  4. GI:  Abdominal pain RUQ [place] normal 09/02/20   Has abdominal MRI scheduled to f/u exphytic 1 cm renal lesion and non obstructive renal calculi Discussed urgency of complying with f/u for this  5. Preoperative:  Clear to have nephrectomy if this lesion turns out to be renal cell carcinoma   F/U cardiology in a year   Signed: Jenkins Rouge 10/06/2020, 8:28 AM

## 2020-10-01 ENCOUNTER — Other Ambulatory Visit: Payer: Self-pay | Admitting: Internal Medicine

## 2020-10-06 ENCOUNTER — Ambulatory Visit: Payer: 59 | Admitting: Cardiovascular Disease

## 2020-10-06 ENCOUNTER — Encounter: Payer: Self-pay | Admitting: Cardiovascular Disease

## 2020-10-06 ENCOUNTER — Other Ambulatory Visit: Payer: Self-pay

## 2020-10-06 VITALS — BP 124/78 | HR 78 | Ht 74.0 in | Wt 246.8 lb

## 2020-10-06 DIAGNOSIS — R079 Chest pain, unspecified: Secondary | ICD-10-CM | POA: Diagnosis not present

## 2020-10-06 DIAGNOSIS — Z01810 Encounter for preprocedural cardiovascular examination: Secondary | ICD-10-CM | POA: Diagnosis not present

## 2020-10-06 DIAGNOSIS — I10 Essential (primary) hypertension: Secondary | ICD-10-CM | POA: Diagnosis not present

## 2020-10-06 DIAGNOSIS — E782 Mixed hyperlipidemia: Secondary | ICD-10-CM

## 2020-10-06 NOTE — Patient Instructions (Signed)

## 2020-10-07 ENCOUNTER — Encounter: Payer: Self-pay | Admitting: Nurse Practitioner

## 2020-10-21 ENCOUNTER — Encounter: Payer: Self-pay | Admitting: Family Medicine

## 2020-10-21 ENCOUNTER — Ambulatory Visit (INDEPENDENT_AMBULATORY_CARE_PROVIDER_SITE_OTHER): Payer: 59 | Admitting: Family Medicine

## 2020-10-21 ENCOUNTER — Other Ambulatory Visit: Payer: Self-pay

## 2020-10-21 VITALS — BP 128/78 | HR 70 | Temp 98.2°F | Resp 16 | Ht 74.0 in | Wt 246.8 lb

## 2020-10-21 DIAGNOSIS — E785 Hyperlipidemia, unspecified: Secondary | ICD-10-CM

## 2020-10-21 DIAGNOSIS — I10 Essential (primary) hypertension: Secondary | ICD-10-CM | POA: Diagnosis not present

## 2020-10-21 DIAGNOSIS — R7303 Prediabetes: Secondary | ICD-10-CM

## 2020-10-21 DIAGNOSIS — N2889 Other specified disorders of kidney and ureter: Secondary | ICD-10-CM

## 2020-10-21 DIAGNOSIS — R1011 Right upper quadrant pain: Secondary | ICD-10-CM | POA: Diagnosis not present

## 2020-10-21 DIAGNOSIS — N200 Calculus of kidney: Secondary | ICD-10-CM | POA: Diagnosis not present

## 2020-10-21 DIAGNOSIS — Z1211 Encounter for screening for malignant neoplasm of colon: Secondary | ICD-10-CM

## 2020-10-21 NOTE — Progress Notes (Signed)
Subjective:  Patient ID: Donald [name], male    DOB: 02-12-1971  Age: 50 y.o. MRN: [id number]  CC:  Chief Complaint  Patient presents with  . Annual Exam    PT doing well, no concerns today., patient is fasting today for blood work.    Understands english - declines interpreter.   HPI Donald [name] presents for   Annual physical exam.  Noted he was evaluated in March by different primary care provider for possible establish care visit and physical.  Seen for back pain at that time.  On review of notes, exam and plain films were unremarkable.  Was recommended he start etodolac 400 mg daily and referred to sports medicine. Back pain improved - did not need to see sports medicine - pain ok now.  He plans to continue following with me for PCP.   Hypertension: He was evaluated by cardiology, Dr. Johnsie Cancel on May 4.  Notes reviewed.  History of HTN, HLD, RBBB.  Nonobstructive cardiac cath in August 2017.  40% ramus disease.  Myoview 07/11/2019 without ischemia, EF 65%.  Hypertension well-controlled at that time, low-sodium/DASH diet and continue on same meds.  Did note in the exophytic renal lesion with planned MRI follow-up.  Was cleared to have nephrectomy if needed from a cardiology standpoint.  Follow-up planned 1 year with cardiology.  Losartan 100 mg daily, HCTZ 25 mg daily. Home readings: 128-130/78-80 BP Readings from Last 3 Encounters:  10/21/20 128/78  10/06/20 124/78  09/02/20 132/87   Lab Results  Component Value Date   CREATININE 0.99 09/01/2020    Renal lesion Evaluated for right upper quadrant abdominal pain March 30 at emergency room.  Labs were overall reassuring at that time, ultrasound obtained without any acute findings but did have contracted gallbladder.  Referred to general surgery with possible need for HIDA scan. CT in December 2021 with increased burden of bilateral nonobstructive renal calculi, largest being 4 mm on the left, no hydronephrosis.  There was an  exophytic 1 cm intermediate density of the left interpolar region of the kidney, indeterminate, renal protocol MRI with contrast recommended.  He has not had MRI, this was discussed with cardiology recently. Has not seen urologist in 10 years. Occasional side pains, no hematuria. Urinating ok.  Was told mRI was refused - needs new referral.   Still episodic pain in RUQ. About 3 days per week.  Off and on, no fever, n/v. Not necessarily associated with food.  Prediabetes: Recent A1c noted in March.  Has lost a few pounds since that time. Prior A1c 6.1 in 2017.  Improved diet, some walking for exercise. Lab Results  Component Value Date   HGBA1C 5.8 08/19/2020   Wt Readings from Last 3 Encounters:  10/21/20 246 lb 12.8 oz (111.9 kg)  10/06/20 246 lb 12.8 oz (111.9 kg)  08/19/20 250 lb 6.4 oz (113.6 kg)   Hyperlipidemia: Lipitor 10 mg daily.  Recent labs in March noted below. No new myalgias/side effects.  Lab Results  Component Value Date   CHOL 174 08/19/2020   HDL 44.90 08/19/2020   LDLCALC 95 08/19/2020   TRIG 170.0 (H) 08/19/2020   CHOLHDL 4 08/19/2020   Lab Results  Component Value Date   ALT 16 09/01/2020   AST 22 09/01/2020   ALKPHOS 43 09/01/2020   BILITOT 0.9 09/01/2020   Colon CA screening - mother with colon CA, agrees to referral for colonoscopy (discussed why Cologuard not appropriate given his FH).    History Patient  Active Problem List   Diagnosis Date Noted  . Dyslipidemia, goal LDL below 130 08/25/2020  . Encounter for general adult medical examination with abnormal findings 08/19/2020  . Prediabetes 08/19/2020  . Screen for colon cancer 08/19/2020  . Diuretic-induced hypokalemia 08/19/2020  . Chronic bilateral low back pain without sciatica 08/19/2020  . Right bundle branch block 03/11/2010  . Essential hypertension 05/14/2009  . PARASOMNIA 05/14/2009  . G E R D 05/13/2009   Past Medical History:  Diagnosis Date  . DEPRESSION, MILD   . Dysuria    . G E R D   . Hypercholesterolemia   . Hyperlipidemia    NOT TAKING MEDICATION  . Hypertension   . Lumbago   . NEPHROLITHIASIS, HX OF   . PARASOMNIA   . Right bundle branch block    Past Surgical History:  Procedure Laterality Date  . CARDIAC CATHETERIZATION N/A 01/19/2016   Procedure: Left Heart Cath and Coronary Angiography;  Surgeon: Sherren Mocha, MD;  Location: [facility];  Service: Cardiovascular;  Laterality: N/A;  . HAND SURGERY    . right and left hand     Allergies  Allergen Reactions  . Ace Inhibitors Cough    Lisinopril caused cough   Prior to Admission medications   Medication Sig Start Date End Date Taking? Authorizing Provider  amLODipine (NORVASC) 10 MG tablet Take 1 tablet (10 mg total) by mouth daily. 08/25/20  Yes Janith Lima, MD  aspirin 81 MG tablet Take 1 tablet (81 mg total) by mouth daily. 10/29/15  Yes Jaynee Eagles, PA-C  atorvastatin (LIPITOR) 10 MG tablet Take 1 tablet (10 mg total) by mouth daily. 08/25/20  Yes Janith Lima, MD  etodolac (LODINE XL) 400 MG 24 hr tablet Take 1 tablet (400 mg total) by mouth daily. 08/19/20  Yes Janith Lima, MD  fluticasone [facility]) 50 MCG/ACT nasal spray USE 1 SPRAY IN BOTH NOSTRILS DAILY 03/27/20  Yes [name], MD  hydrochlorothiazide (HYDRODIURIL) 25 MG tablet TAKE 1 TABLET EVERY DAY 10/01/20  Yes Janith Lima, MD  losartan (COZAAR) 100 MG tablet TAKE 1 TABLET EVERY DAY 10/01/20  Yes Janith Lima, MD  oxyCODONE-acetaminophen (PERCOCET) 5-325 MG tablet Take 1 tablet by mouth every 4 (four) hours as needed. 09/02/20  Yes Larene Pickett, PA-C   Social History   Socioeconomic History  . Marital status: Married    Spouse name: Not on file  . Number of children: 2  . Years of education: 37  . Highest education level: Not on file  Occupational History  . Occupation: Glass blower/designer    Employer: [employer]  Tobacco Use  . Smoking status: Never Smoker  . Smokeless tobacco: Never  Used  Substance and Sexual Activity  . Alcohol use: No    Alcohol/week: 0.0 standard drinks  . Drug use: No  . Sexual activity: Not on file  Other Topics Concern  . Not on file  Social History Narrative   ** Merged History Encounter **       Social Determinants of Health   Financial Resource Strain: Not on file  Food Insecurity: Not on file  Transportation Needs: Not on file  Physical Activity: Not on file  Stress: Not on file  Social Connections: Not on file  Intimate Partner Violence: Not on file    Review of Systems Per HPI.   Objective:   Vitals:   10/21/20 0935  BP: 128/78  Pulse: 70  Resp:  16  Temp: 98.2 F (36.8 C)  TempSrc: Temporal  SpO2: 97%  Weight: 246 lb 12.8 oz (111.9 kg)  Height: 6\' 2"  (1.88 m)     Physical Exam Vitals reviewed.  Constitutional:      Appearance: He is well-developed.  HENT:     Head: Normocephalic and atraumatic.  Eyes:     Pupils: Pupils are equal, round, and reactive to light.  Neck:     Vascular: No carotid bruit or JVD.  Cardiovascular:     Rate and Rhythm: Normal rate and regular rhythm.     Heart sounds: Normal heart sounds. No murmur heard.   Pulmonary:     Effort: Pulmonary effort is normal.     Breath sounds: Normal breath sounds. No rales.  Abdominal:     General: There is no distension.     Tenderness: There is abdominal tenderness (min tenderness with deep palpation but no rebound or guarding in the right upper quadrant.  Negative Murphy's, negative McBurney's.). There is no right CVA tenderness, left CVA tenderness, guarding or rebound.  Skin:    General: Skin is warm and dry.  Neurological:     Mental Status: He is alert and oriented to person, place, and time.      Assessment & Plan:  Donald [name] is a 50 y.o. male . RUQ abdominal pain - Plan: Ambulatory referral to General Surgery  - refer to surgery to eval RUQ pain. Possible HIDA.   Nephrolithiasis - Plan: Ambulatory referral to  Urology  - refer for follow up nephrolithiasis and renal lesion. reordered MRI.   Essential hypertension  -  Stable, tolerating current regimen.  Prediabetes  - continue weight management, recheck A1c in 90months.   Hyperlipidemia, unspecified hyperlipidemia type  - continue statin. Stable on recent labs.   Other specified disorders of kidney and ureter - Plan: MR ABDOMEN WO CONTRAST Left renal mass - Plan: MR ABDOMEN WO CONTRAST  - as above. MRI, and urology referral.   Screen for colon cancer - Plan: Ambulatory referral to Gastroenterology   No orders of the defined types were placed in this encounter.  Patient Instructions  I will reorder the MRI for the kidney lesion.  I have referred you to urology to discuss that area as well as kidney stones.  I have referred you to the surgeon's office to discuss the right upper quadrant abdominal pain and possible other testing.  If any nausea, vomiting, fever or acute worsening symptoms be seen here, urgent care or ER.  I have also referred you to gastroenterology to discuss colonoscopy, especially with family history.  Please let me know if there are questions.  Recheck in 6 months for recheck of meds and labs.  Let me know if there are questions sooner.     Signed, [name], MD [facility]

## 2020-10-21 NOTE — Patient Instructions (Signed)
I will reorder the MRI for the kidney lesion.  I have referred you to urology to discuss that area as well as kidney stones.  I have referred you to the surgeon's office to discuss the right upper quadrant abdominal pain and possible other testing.  If any nausea, vomiting, fever or acute worsening symptoms be seen here, urgent care or ER.  I have also referred you to gastroenterology to discuss colonoscopy, especially with family history.  Please let me know if there are questions.  Recheck in 6 months for recheck of meds and labs.  Let me know if there are questions sooner.

## 2020-10-27 ENCOUNTER — Encounter: Payer: Self-pay | Admitting: Nurse Practitioner

## 2020-10-27 ENCOUNTER — Ambulatory Visit: Payer: 59 | Admitting: Nurse Practitioner

## 2020-10-27 VITALS — BP 140/80 | HR 73 | Ht 74.0 in | Wt 246.0 lb

## 2020-10-27 DIAGNOSIS — R1011 Right upper quadrant pain: Secondary | ICD-10-CM

## 2020-10-27 DIAGNOSIS — G8929 Other chronic pain: Secondary | ICD-10-CM

## 2020-10-27 DIAGNOSIS — Z1211 Encounter for screening for malignant neoplasm of colon: Secondary | ICD-10-CM | POA: Diagnosis not present

## 2020-10-27 MED ORDER — CLENPIQ 10-3.5-12 MG-GM -GM/160ML PO SOLN: 320.0000 mL | Freq: Once | ORAL | 0 refills | Status: AC

## 2020-10-27 NOTE — Patient Instructions (Signed)
If you are age 52 or older, your body mass index should be between 23-30. Your Body mass index is 31.58 kg/m. If this is out of the aforementioned range listed, please consider follow up with your Primary Care Provider.  If you are age 56 or younger, your body mass index should be between 19-25. Your Body mass index is 31.58 kg/m. If this is out of the aformentioned range listed, please consider follow up with your Primary Care Provider.   You have been scheduled for an endoscopy and colonoscopy. Please follow the written instructions given to you at your visit today. Please pick up your prep supplies at the pharmacy within the next 1-3 days. If you use inhalers (even only as needed), please bring them with you on the day of your procedure.  The [facility] providers would like to encourage you to use [facility] to communicate with providers for non-urgent requests or questions.  Due to long hold times on the telephone, sending your provider a message by [facility] may be a faster and more efficient way to get a response.  Please allow 48 business hours for a response.  Please remember that this is for non-urgent requests.   Due to recent changes in healthcare laws, you may see the results of your imaging and laboratory studies on MyChart before your provider has had a chance to review them.  We understand that in some cases there may be results that are confusing or concerning to you. Not all laboratory results come back in the same time frame and the provider may be waiting for multiple results in order to interpret others.  Please give [place] 48 hours in order for your provider to thoroughly review all the results before contacting the office for clarification of your results.   Thank you for choosing me and [facility].  Tye Savoy, NP

## 2020-10-27 NOTE — Progress Notes (Addendum)
ASSESSMENT AND PLAN    # 50 year old male with 1.5 year history of worsening intermittent RUQ pain often radiating through to back but sometimes into the RLQ.  Pain not positional, it sometimes improves with eating.  RUQ ultrasound October 2021 showed gallbladder wall thickening concerning for acalculous cholecystitis.  However most recent RUQ ultrasound late March 2022 was unremarkable.  Noncontrast CT scan November 2021 negative for any hepatobiliary findings. His labs have been unrevealing.  Negative Carnett's sign on exam.  --For further evaluation of RUQ pain will proceed with EGD. The risks and benefits of EGD with possible biopsies was discussed with the patient and they agree to proceed.   ADDENDUM 11/30/20. [facility] has evaluated patient, getting HIDA scan. See message from Dr. Michaelle Birks ( [place])  Steva Ready,  I saw Donald [name] today for chronic RUQ pain, I see that you also saw him for this last month. His [place] is normal, and I know that you all are already planning to do an EGD and colonoscopy. I am also going to order a HIDA because his symptoms sound consistent with biliary dyskinesia. If the EGD doesn't show any source for his pain and the HIDA is at all abnormal, I will schedule him for cholecystectomy.  Thanks,  Notus   #Colon cancer screening.  -- Patient will be scheduled for screening colonoscopy to be done at time of EGD. The risks and benefits of colonoscopy with possible polypectomy / biopsies were discussed and the patient agrees to proceed.    # Right renal lesion.  Noncontrast CT scan November 2021 for evaluation of right flank pain shows an exophytic 1 cm intermediate density left interpolar region renal lesion, which is indeterminate and for which further evaluation withrenal protocol MRI with contrast is recommended ( MRI scheduled for the future)  HISTORY OF PRESENT ILLNESS     Chief Complaint : Right-sided abdominal pain  Donald [name] is a 50 y.o. male with a  past medical history significant for kidney stones, hypertension, hyperlipidemia , GERD. See additional PMH below.   Patient is new to the practice, referred by PCP for evaluation of right sided abdomial pain present for ~ 1.5 years but progressive over last several months. The RUQ pain often radiates through to his back but sometimes it radiates down into the RLQ. The pain is intermittent throughout the day,   sometimes gets better but does not totally resolve with eating .  Meals never make the pain worse.  Pain not positional or related to movement and it is not improved with defecation. Tylenol helps the pain.  Pain not associated with nausea / vomiting and his BMs are at baseline. No blood in stool.  He denies NSAID use.  He has occasional reflux relieved with OTC acid reducers.  RUQ ultrasound in October 2021 suggested a degree of a calculus cholecystitis but another ultrasound in late March of this year was unremarkable.  His labs have been unremarkable.  No hepatobiliary findings on noncontrast CT scan done November 2021.  Appendix appears normal.   Patient has never had a colon cancer screening. He thinks an aunt may of had colon cancer in her 67s.     PREVIOUS STUDIES:  Jan 2021 RUQ [place] for RUQ   -unremarkable  Oct 2021 RUQ [place] 1. Gallbladder wall is thickened and appears edematous with focal tenderness noted over the gallbladder. These findings are concerningfor a degree of acalculus cholecystitis. 2. Suspect a degree of fatty infiltration in the  liver. No focal liver lesions evident  Nov 2021 CT scan wo contrast for right flank pain  IMPRESSION: 1. Increased burden of bilateral nonobstructive renal calculi the largest is on the left and measures 4 mm. No hydronephrosis. 2. Exophytic 1 cm intermediate density left interpolar region renal lesion, which is indeterminate and for which further evaluation with renal protocol MRI with contrast is recommended. 3. Aortic  atherosclerosis.  March 2022 RUQ [place] for abdominal pain --No gb stones or wall thickening   March 2022 labs  lipase, CMP ok except for hypokalemia CBC normal   PREVIOUS EVALUATIONS:   NONE     Past Medical History:  Diagnosis Date   DEPRESSION, MILD    Dysuria    G E R D    Hypercholesterolemia    Hyperlipidemia    NOT TAKING MEDICATION   Hypertension    Lumbago    NEPHROLITHIASIS, HX OF    PARASOMNIA    Right bundle branch block      Past Surgical History:  Procedure Laterality Date   CARDIAC CATHETERIZATION N/A 01/19/2016   Procedure: Left Heart Cath and Coronary Angiography;  Surgeon: Sherren Mocha, MD;  Location: [facility];  Service: Cardiovascular;  Laterality: N/A;   HAND SURGERY     right and left hand     Family History  Problem Relation Age of Onset   Hypertension Mother    Hypertension Sister    Social History   Tobacco Use   Smoking status: Never Smoker   Smokeless tobacco: Never Used  Substance Use Topics   Alcohol use: No    Alcohol/week: 0.0 standard drinks   Drug use: No   Current Outpatient Medications  Medication Sig Dispense Refill   amLODipine (NORVASC) 10 MG tablet Take 1 tablet (10 mg total) by mouth daily. 90 tablet 1   aspirin 81 MG tablet Take 1 tablet (81 mg total) by mouth daily. 30 tablet 5   atorvastatin (LIPITOR) 10 MG tablet Take 1 tablet (10 mg total) by mouth daily. 90 tablet 1   etodolac (LODINE XL) 400 MG 24 hr tablet Take 1 tablet (400 mg total) by mouth daily. 90 tablet 0   fluticasone (FLONASE) 50 MCG/ACT nasal spray USE 1 SPRAY IN BOTH NOSTRILS DAILY 48 mL 2   losartan (COZAAR) 100 MG tablet TAKE 1 TABLET EVERY DAY 90 tablet 1   oxyCODONE-acetaminophen (PERCOCET) 5-325 MG tablet Take 1 tablet by mouth every 4 (four) hours as needed. 20 tablet 0   No current facility-administered medications for this visit.   Allergies  Allergen Reactions   Ace Inhibitors Cough    Lisinopril caused cough    Review of  Systems: Positive for allergy, sinus trouble, back pain. All other systems reviewed and negative except where noted in HPI.    PHYSICAL EXAM :    Wt Readings from Last 3 Encounters:  10/27/20 246 lb (111.6 kg)  10/21/20 246 lb 12.8 oz (111.9 kg)  10/06/20 246 lb 12.8 oz (111.9 kg)    BP 140/80   Pulse 73   Ht 6\' 2"  (1.88 m)   Wt 246 lb (111.6 kg)   SpO2 97%   BMI 31.58 kg/m  Constitutional:  Pleasant well-developed male in no acute distress. Psychiatric: Normal mood and affect. Behavior is normal. EENT: Pupils normal.  Conjunctivae are normal. No scleral icterus. Neck supple.  Cardiovascular: Normal rate, regular rhythm. No edema Pulmonary/chest: Effort normal and breath sounds normal. No wheezing, rales or rhonchi. Abdominal:  Soft, nondistended, nontender. Bowel sounds active throughout. There are no masses palpable. Negative Carnett's sign. No hepatomegaly. Neurological: Alert and oriented to person place and time. Skin: Skin is warm and dry. No rashes noted.  Tye Savoy, NP  10/27/2020, 11:54 AM  Cc:  Referring Provider Janith Lima, MD

## 2020-10-29 NOTE — Progress Notes (Signed)
Agree with the assessment and plan as outlined by Paula Guenther, NP. ° °Aarthi Uyeno, DO, FACG ° °

## 2020-11-09 ENCOUNTER — Other Ambulatory Visit: Payer: 59

## 2020-11-11 ENCOUNTER — Other Ambulatory Visit: Payer: Self-pay

## 2020-11-11 ENCOUNTER — Ambulatory Visit
Admission: RE | Admit: 2020-11-11 | Discharge: 2020-11-11 | Disposition: A | Discharge status: Home / Self Care | Payer: 59 | Source: Ambulatory Visit | Attending: Family Medicine | Admitting: Family Medicine

## 2020-11-11 DIAGNOSIS — N2889 Other specified disorders of kidney and ureter: Secondary | ICD-10-CM

## 2020-11-11 MED ORDER — GADOBENATE DIMEGLUMINE 529 MG/ML IV SOLN
20.0000 mL | Freq: Once | INTRAVENOUS | Status: AC | PRN
  Administered 2020-11-11: 20 mL via INTRAVENOUS

## 2020-11-18 ENCOUNTER — Other Ambulatory Visit: Payer: Self-pay | Admitting: Internal Medicine

## 2020-11-18 DIAGNOSIS — G8929 Other chronic pain: Secondary | ICD-10-CM

## 2020-11-28 ENCOUNTER — Ambulatory Visit (INDEPENDENT_AMBULATORY_CARE_PROVIDER_SITE_OTHER): Payer: 59

## 2020-11-28 ENCOUNTER — Encounter (HOSPITAL_COMMUNITY): Payer: Self-pay

## 2020-11-28 ENCOUNTER — Ambulatory Visit (HOSPITAL_COMMUNITY)
Admission: EM | Admit: 2020-11-28 | Discharge: 2020-11-28 | Disposition: A | Discharge status: Home / Self Care | Payer: 59 | Attending: Medical Oncology | Admitting: Medical Oncology

## 2020-11-28 ENCOUNTER — Other Ambulatory Visit: Payer: Self-pay

## 2020-11-28 DIAGNOSIS — L03031 Cellulitis of right toe: Secondary | ICD-10-CM

## 2020-11-28 DIAGNOSIS — M79671 Pain in right foot: Secondary | ICD-10-CM | POA: Diagnosis not present

## 2020-11-28 MED ORDER — DOXYCYCLINE HYCLATE 100 MG PO CAPS: 100.0000 mg | ORAL_CAPSULE | Freq: Two times a day (BID) | ORAL | 0 refills | Status: DC

## 2020-11-28 MED ORDER — CLOTRIMAZOLE 1 % EX CREA: TOPICAL_CREAM | CUTANEOUS | 0 refills | Status: DC

## 2020-11-28 NOTE — ED Triage Notes (Signed)
Pt present right foot/toe pain, pt state that symptoms started two days ago. Pt states that its an aching and shooting pain. He denies any injury to the toe.

## 2020-11-28 NOTE — ED Provider Notes (Signed)
[facility]    CSN: [id number] Arrival date & time: 11/28/20  1119      History   Chief Complaint Chief Complaint  Patient presents with   Toe Pain    HPI Donald [name] is a 50 y.o. male.   HPI  Foot Pain: Patient declines translator.  Patient reports for that for the past 2 to 3 days he has noticed skin breakdown of his right foot.  He does not recall hitting it on anything but he does have a chronic skin breakdown of bilateral feet.  He states that over the past 2 to 3 days the area has been red and puffy in nature.  The area is tender to touch and he is having some peripheral edema extending up to the ankle.  He denies having any fevers but states that last night he felt feverish.  No vomiting and no history of MRSA. He is UTD on his TDAP.   Past Medical History:  Diagnosis Date   DEPRESSION, MILD    Dysuria    G E R D    Hypercholesterolemia    Hyperlipidemia    NOT TAKING MEDICATION   Hypertension    Lumbago    NEPHROLITHIASIS, HX OF    PARASOMNIA    Right bundle branch block     Patient Active Problem List   Diagnosis Date Noted   Dyslipidemia, goal LDL below 130 08/25/2020   Encounter for general adult medical examination with abnormal findings 08/19/2020   Prediabetes 08/19/2020   Screen for colon cancer 08/19/2020   Diuretic-induced hypokalemia 08/19/2020   Chronic bilateral low back pain without sciatica 08/19/2020   Right bundle branch block 03/11/2010   Essential hypertension 05/14/2009   PARASOMNIA 05/14/2009   G E R D 05/13/2009    Past Surgical History:  Procedure Laterality Date   CARDIAC CATHETERIZATION N/A 01/19/2016   Procedure: Left Heart Cath and Coronary Angiography;  Surgeon: Sherren Mocha, MD;  Location: [facility];  Service: Cardiovascular;  Laterality: N/A;   HAND SURGERY     right and left hand         Home Medications    Prior to Admission medications   Medication Sig Start Date End Date Taking?  Authorizing Provider  amLODipine (NORVASC) 10 MG tablet Take 1 tablet (10 mg total) by mouth daily. 08/25/20   Janith Lima, MD  aspirin 81 MG tablet Take 1 tablet (81 mg total) by mouth daily. 10/29/15   Jaynee Eagles, PA-C  atorvastatin (LIPITOR) 10 MG tablet Take 1 tablet (10 mg total) by mouth daily. 08/25/20   Janith Lima, MD  etodolac (LODINE XL) 400 MG 24 hr tablet TAKE 1 TABLET BY MOUTH EVERY DAY 11/18/20   Janith Lima, MD  fluticasone [facility]) 50 MCG/ACT nasal spray USE 1 SPRAY IN BOTH NOSTRILS DAILY 03/27/20   [name], MD  losartan (COZAAR) 100 MG tablet TAKE 1 TABLET EVERY DAY 10/01/20   Janith Lima, MD  oxyCODONE-acetaminophen (PERCOCET) 5-325 MG tablet Take 1 tablet by mouth every 4 (four) hours as needed. 09/02/20   Larene Pickett, PA-C    Family History Family History  Problem Relation Age of Onset   Hypertension Mother    Hypertension Sister     Social History Social History   Tobacco Use   Smoking status: Never   Smokeless tobacco: Never  Substance Use Topics   Alcohol use: No    Alcohol/week: 0.0 standard drinks  Drug use: No     Allergies   Ace inhibitors   Review of Systems Review of Systems  As stated above in HPI Physical Exam Triage Vital Signs ED Triage Vitals [11/28/20 1225]  Enc Vitals Group     BP 137/79     Pulse Rate 80     Resp 18     Temp 98.9 F (37.2 C)     Temp Source Temporal     SpO2 100 %     Weight      Height      Head Circumference      Peak Flow      Pain Score 7     Pain Loc      Pain Edu?      Excl. in [place]?    No data found.  Updated Vital Signs BP 137/79 (BP Location: Right Arm)   Pulse 80   Temp 98.9 F (37.2 C) (Temporal)   Resp 18   SpO2 100%   Physical Exam Vitals and nursing note reviewed.  Constitutional:      General: He is not in acute distress.    Appearance: Normal appearance. He is not ill-appearing, toxic-appearing or diaphoretic.  Cardiovascular:     Rate and Rhythm:  Normal rate and regular rhythm.     Pulses: Normal pulses.     Heart sounds: Normal heart sounds.  Pulmonary:     Effort: Pulmonary effort is normal.     Breath sounds: Normal breath sounds.  Musculoskeletal:        General: Swelling and tenderness present. Normal range of motion.  Skin:    General: Skin is warm.     Capillary Refill: Capillary refill takes less than 2 seconds.     Comments: See below  Neurological:     Mental Status: He is alert and oriented to person, place, and time.     Sensory: No sensory deficit.        UC Treatments / Results  Labs (all labs ordered are listed, but only abnormal results are displayed) Labs Reviewed - No data to display  EKG   Radiology No results found.  Procedures Procedures (including critical care time)  Medications Ordered in UC Medications - No data to display  Initial Impression / Assessment and Plan / UC Course  I have reviewed the triage vital signs and the nursing notes.  Pertinent labs & imaging results that were available during my care of the patient were reviewed by me and considered in my medical decision making (see chart for details).     New.  Appears to be cellulitis.  We will plan to treat with doxycycline unless his x-ray shows evidence of osteomyelitis to prevent further systemic complications.  I discussed this with patient.  Discussed that he uses antibiotic along with common potential side effects and precautions.  We discussed the importance of medical follow-up within 3 days to ensure that this is improving.  Discussed red flag signs and symptoms.  Also sending in Lotrimin to help with his tinea pedis. Final Clinical Impressions(s) / UC Diagnoses   Final diagnoses:  None   Discharge Instructions   None    ED Prescriptions   None    PDMP not reviewed this encounter.   Hughie Closs, [place] 11/28/20 1251

## 2020-12-01 ENCOUNTER — Emergency Department (HOSPITAL_COMMUNITY): Payer: 59

## 2020-12-01 ENCOUNTER — Encounter (HOSPITAL_COMMUNITY): Payer: Self-pay

## 2020-12-01 ENCOUNTER — Other Ambulatory Visit: Payer: Self-pay

## 2020-12-01 ENCOUNTER — Ambulatory Visit (HOSPITAL_COMMUNITY): Admission: EM | Admit: 2020-12-01 | Discharge: 2020-12-01 | Payer: 59

## 2020-12-01 ENCOUNTER — Emergency Department (HOSPITAL_COMMUNITY)
Admission: EM | Admit: 2020-12-01 | Discharge: 2020-12-01 | Disposition: A | Discharge status: Home / Self Care | Payer: 59 | Attending: Emergency Medicine | Admitting: Emergency Medicine

## 2020-12-01 DIAGNOSIS — Z7982 Long term (current) use of aspirin: Secondary | ICD-10-CM | POA: Diagnosis not present

## 2020-12-01 DIAGNOSIS — Z79899 Other long term (current) drug therapy: Secondary | ICD-10-CM | POA: Insufficient documentation

## 2020-12-01 DIAGNOSIS — L03115 Cellulitis of right lower limb: Secondary | ICD-10-CM | POA: Diagnosis not present

## 2020-12-01 DIAGNOSIS — I10 Essential (primary) hypertension: Secondary | ICD-10-CM | POA: Diagnosis not present

## 2020-12-01 DIAGNOSIS — Z955 Presence of coronary angioplasty implant and graft: Secondary | ICD-10-CM | POA: Diagnosis not present

## 2020-12-01 DIAGNOSIS — L03116 Cellulitis of left lower limb: Secondary | ICD-10-CM | POA: Diagnosis not present

## 2020-12-01 DIAGNOSIS — R509 Fever, unspecified: Secondary | ICD-10-CM | POA: Diagnosis present

## 2020-12-01 LAB — BASIC METABOLIC PANEL
Anion gap: 7 (ref 5–15)
BUN: 8 mg/dL (ref 6–20)
CO2: 27 mmol/L (ref 22–32)
Calcium: 9.5 mg/dL (ref 8.9–10.3)
Chloride: 102 mmol/L (ref 98–111)
Creatinine, Ser: 0.9 mg/dL (ref 0.61–1.24)
GFR, Estimated: >60 mL/min (ref >60)
Glucose, Bld: 90 mg/dL (ref 70–99)
Potassium: 3.6 mmol/L (ref 3.5–5.1)
Sodium: 136 mmol/L (ref 135–145)

## 2020-12-01 LAB — CBC WITH DIFFERENTIAL/PLATELET
Abs Immature Granulocytes: 0.02 10*3/uL (ref 0.00–0.07)
Basophils Absolute: 0.1 10*3/uL (ref 0.0–0.1)
Basophils Relative: 1 %
Eosinophils Absolute: 0.3 10*3/uL (ref 0.0–0.5)
Eosinophils Relative: 5 %
HCT: 43.3 % (ref 39.0–52.0)
Hemoglobin: 14.1 g/dL (ref 13.0–17.0)
Immature Granulocytes: 0 %
Lymphocytes Relative: 39 %
Lymphs Abs: 2.1 10*3/uL (ref 0.7–4.0)
MCH: 28.1 pg (ref 26.0–34.0)
MCHC: 32.6 g/dL (ref 30.0–36.0)
MCV: 86.3 fL (ref 80.0–100.0)
Monocytes Absolute: 0.6 10*3/uL (ref 0.1–1.0)
Monocytes Relative: 11 %
Neutro Abs: 2.3 10*3/uL (ref 1.7–7.7)
Neutrophils Relative %: 44 %
Platelets: 357 10*3/uL (ref 150–400)
RBC: 5.02 MIL/uL (ref 4.22–5.81)
RDW: 14.3 % (ref 11.5–15.5)
WBC: 5.3 10*3/uL (ref 4.0–10.5)
nRBC: 0 % (ref 0.0–0.2)

## 2020-12-01 LAB — CBG MONITORING, ED: Glucose-Capillary: 94 mg/dL (ref 70–99)

## 2020-12-01 LAB — LACTIC ACID, PLASMA: Lactic Acid, Venous: 1.3 mmol/L (ref 0.5–1.9)

## 2020-12-01 MED ORDER — CLINDAMYCIN HCL 150 MG PO CAPS
300.0000 mg | ORAL_CAPSULE | Freq: Once | ORAL | Status: AC
  Administered 2020-12-01: 300 mg via ORAL
  Filled 2020-12-01: qty 2

## 2020-12-01 MED ORDER — CLINDAMYCIN HCL 300 MG PO CAPS: 300.0000 mg | ORAL_CAPSULE | Freq: Four times a day (QID) | ORAL | 0 refills | Status: AC

## 2020-12-01 NOTE — ED Triage Notes (Addendum)
Pt with open area to 4th toe on right foot which started 11/25/20. Pt has noted increased swelling and pain since this started, notable swelling to toe and foot with pain all the way up foot per pt. Was seen here for this 6/26 but states has continued to get worse. Reports fever of 102 yesterday. Took tylenol this AM.

## 2020-12-01 NOTE — ED Notes (Signed)
Patient is being discharged from the Urgent Care and sent to the Emergency Department via personal vehicle . Per Provider Caroll Rancher, patient is in need of higher level of care due to needing further evaluation. Patient is aware and verbalizes understanding of plan of care.   Vitals:   12/01/20 1135  BP: 134/78  Pulse: 72  Resp: 18  Temp: 97.8 F (36.6 C)  SpO2: 99%

## 2020-12-01 NOTE — ED Triage Notes (Signed)
Pt presents with ongoing pain, swelling and redness to his Right foot. Started with an open sore to his Right 4th toe. Given abx on Sunday, taking as prescribed.

## 2020-12-01 NOTE — ED Provider Notes (Signed)
Emergency Medicine Provider Triage Evaluation Note  Donald [name] , a 50 y.o. male  was evaluated in triage.  Pt complains of worsening pain and swelling in the right foot.  He first noticed a small wound on his right fourth toe About Friday.  He was given antibiotics over the weekend by urgent care.  He has had more than 48 hours of antibiotics and went back to urgent care today as it has been getting worse.  He has been taking doxycycline and reports compliance with it.  He states he had a fever up to over 101 last night.  He denies any cough or shortness of breath, no other wounds or symptoms.  Starting antibiotics the pain is worsening and spreading into his foot and the redness has spread into his foot..  Review of Systems  Positive: Pain in right 4th toe, redness in right foot, fever Negative: Cough, CP, SHOB.   Physical Exam  BP (!) 127/93 (BP Location: Right Arm)   Pulse 88   Temp 98.7 F (37.1 C) (Oral)   Resp 16   SpO2 100%  Gen:   Awake, no distress   Resp:  Normal effort  MSK:   Moves extremities without difficulty  Other:  Right foot with wound on the dorsal aspect of the fourth toe.  There is erythema surrounding the wound and it is tender to palpation.  Remainder of right foot is appropriately warm to touch.  Medical Decision Making  Medically screening exam initiated at 1:28 PM.  Appropriate orders placed.  Donald [name] was informed that the remainder of the evaluation will be completed by another provider, this initial triage assessment does not replace that evaluation, and the importance of remaining in the ED until their evaluation is complete.  Note: Portions of this report may have been transcribed using voice recognition software. Every effort was made to ensure accuracy; however, inadvertent computerized transcription errors may be present    Donald Glass, PA-C 12/01/20 1330    Donald Gambler, MD 12/04/20 [phone]

## 2020-12-01 NOTE — Discharge Instructions (Signed)
As swelling has gotten worse despite taking antibiotics and you developed a fever, it is recommended that you go to the ED for further evaluation and possible IV antibiotics.

## 2020-12-01 NOTE — Discharge Instructions (Addendum)
Apply warm compress for at least 10 to 15 minutes at least 4 times a day.  Stop your doxycycline and switch to the clindamycin.  Please return for rapid spreading redness or develop a fever.  I viewed any information for the doctor that typically follows up for wounds that are not improving.  If you continue to have issues then please reach out to him to set up an appointment.

## 2020-12-01 NOTE — ED Provider Notes (Signed)
[place] [facility] EMERGENCY DEPARTMENT Provider Note   CSN: [id number] Arrival date & time: 12/01/20  1155     History Chief Complaint  Patient presents with   Foot Pain    Donald [name] is a 50 y.o. male.  50 yo M with a chief complaints of wound to his foot and toe swelling.  Going on for about a week now.  Was seen in urgent care and was started on doxycycline.  Since then he feels like it is gotten slightly worse.  Seen back in the office today and they sent him here for evaluation.  Reporting subjective fevers at home.  Having some bloody drainage.  The history is provided by the patient.  Foot Pain This is a new problem. The current episode started more than 2 days ago. The problem occurs constantly. The problem has been gradually worsening. Pertinent negatives include no chest pain, no abdominal pain, no headaches and no shortness of breath. Nothing aggravates the symptoms. Nothing relieves the symptoms. He has tried nothing for the symptoms. The treatment provided no relief.      Past Medical History:  Diagnosis Date   DEPRESSION, MILD    Dysuria    G E R D    Hypercholesterolemia    Hyperlipidemia    NOT TAKING MEDICATION   Hypertension    Lumbago    NEPHROLITHIASIS, HX OF    PARASOMNIA    Right bundle branch block     Patient Active Problem List   Diagnosis Date Noted   Dyslipidemia, goal LDL below 130 08/25/2020   Encounter for general adult medical examination with abnormal findings 08/19/2020   Prediabetes 08/19/2020   Screen for colon cancer 08/19/2020   Diuretic-induced hypokalemia 08/19/2020   Chronic bilateral low back pain without sciatica 08/19/2020   Right bundle branch block 03/11/2010   Essential hypertension 05/14/2009   PARASOMNIA 05/14/2009   G E R D 05/13/2009    Past Surgical History:  Procedure Laterality Date   CARDIAC CATHETERIZATION N/A 01/19/2016   Procedure: Left Heart Cath and Coronary Angiography;  Surgeon:  Sherren Mocha, MD;  Location: [facility];  Service: Cardiovascular;  Laterality: N/A;   HAND SURGERY     right and left hand         Family History  Problem Relation Age of Onset   Hypertension Mother    Hypertension Sister     Social History   Tobacco Use   Smoking status: Never   Smokeless tobacco: Never  Substance Use Topics   Alcohol use: No    Alcohol/week: 0.0 standard drinks   Drug use: No    Home Medications Prior to Admission medications   Medication Sig Start Date End Date Taking? Authorizing Provider  amLODipine (NORVASC) 10 MG tablet Take 1 tablet (10 mg total) by mouth daily. 08/25/20   Janith Lima, MD  aspirin 81 MG tablet Take 1 tablet (81 mg total) by mouth daily. 10/29/15   Jaynee Eagles, PA-C  atorvastatin (LIPITOR) 10 MG tablet Take 1 tablet (10 mg total) by mouth daily. 08/25/20   Janith Lima, MD  clotrimazole (LOTRIMIN) 1 % cream Apply to affected area 2 times daily 11/28/20   Hughie Closs, PA-C  doxycycline (VIBRAMYCIN) 100 MG capsule Take 1 capsule (100 mg total) by mouth 2 (two) times daily. 11/28/20   Hughie Closs, PA-C  etodolac (LODINE XL) 400 MG 24 hr tablet TAKE 1 TABLET BY MOUTH EVERY DAY 11/18/20  Janith Lima, MD  fluticasone [facility]) 50 MCG/ACT nasal spray USE 1 SPRAY IN BOTH NOSTRILS DAILY 03/27/20   [name], MD  losartan (COZAAR) 100 MG tablet TAKE 1 TABLET EVERY DAY 10/01/20   Janith Lima, MD  oxyCODONE-acetaminophen (PERCOCET) 5-325 MG tablet Take 1 tablet by mouth every 4 (four) hours as needed. 09/02/20   Larene Pickett, PA-C    Allergies    Ace inhibitors  Review of Systems   Review of Systems  Constitutional:  Negative for chills and fever.  HENT:  Negative for congestion and facial swelling.   Eyes:  Negative for discharge and visual disturbance.  Respiratory:  Negative for shortness of breath.   Cardiovascular:  Negative for chest pain and palpitations.  Gastrointestinal:  Negative for  abdominal pain, diarrhea and vomiting.  Musculoskeletal:  Negative for arthralgias and myalgias.  Skin:  Positive for wound. Negative for color change and rash.  Neurological:  Negative for tremors, syncope and headaches.  Psychiatric/Behavioral:  Negative for confusion and dysphoric mood.    Physical Exam Updated Vital Signs BP (!) 147/94 (BP Location: Right Arm)   Pulse 74   Temp 98.7 F (37.1 C) (Oral)   Resp 15   SpO2 96%   Physical Exam Vitals and nursing note reviewed.  Constitutional:      Appearance: He is well-developed.  HENT:     Head: Normocephalic and atraumatic.  Eyes:     Pupils: Pupils are equal, round, and reactive to light.  Neck:     Vascular: No JVD.  Cardiovascular:     Rate and Rhythm: Normal rate and regular rhythm.     Heart sounds: No murmur heard.   No friction rub. No gallop.  Pulmonary:     Effort: No respiratory distress.     Breath sounds: No wheezing.  Abdominal:     General: There is no distension.     Tenderness: There is no abdominal tenderness. There is no guarding or rebound.  Musculoskeletal:        General: Normal range of motion.     Cervical back: Normal range of motion and neck supple.     Comments: Ulceration to the base of the fourth digit of the right foot.  Cap refill <2 sec. PMS intact distally.    Skin:    Coloration: Skin is not pale.     Findings: No rash.  Neurological:     Mental Status: He is alert and oriented to person, place, and time.  Psychiatric:        Behavior: Behavior normal.    ED Results / Procedures / Treatments   Labs (all labs ordered are listed, but only abnormal results are displayed) Labs Reviewed  CBC WITH DIFFERENTIAL/PLATELET  BASIC METABOLIC PANEL  LACTIC ACID, PLASMA  LACTIC ACID, PLASMA  CBG MONITORING, ED    EKG None  Radiology DG Foot Complete Right  Result Date: 12/01/2020 CLINICAL DATA:  Right foot pain and swelling with wound. EXAM: RIGHT FOOT COMPLETE - 3+ VIEW  COMPARISON:  November 28, 2020. FINDINGS: There is no evidence of fracture or dislocation. There is no evidence of arthropathy or other focal bone abnormality. Soft tissues are unremarkable. IMPRESSION: Negative. Electronically Signed   By: Marijo Conception M.D.   On: 12/01/2020 14:50    Procedures Procedures   Medications Ordered in ED Medications  clindamycin (CLEOCIN) capsule 300 mg (has no administration in time range)    ED Course  I have  reviewed the triage vital signs and the nursing notes.  Pertinent labs & imaging results that were available during my care of the patient were reviewed by me and considered in my medical decision making (see chart for details).    MDM Rules/Calculators/A&P                          50 yo M with a chief complaints of skin infection to his right fourth digit.  Has been seen at urgent care and has been started on doxycycline without significant improvement.  He tells me that it is worsening.  Based on the picture from his initial visit it looks very similar to me.  I will change his antibiotic based on his history.  Switched from Doxy to clindamycin.  No noted fluctuance or purulence.  He does have some mild bleeding from the area.  Given follow-up with orthopedics if needed.  Warm compresses.  5:43 PM:  I have discussed the diagnosis/risks/treatment options with the patient and believe the pt to be eligible for discharge home to follow-up with Ortho. We also discussed returning to the ED immediately if new or worsening sx occur. We discussed the sx which are most concerning (e.g., sudden worsening pain, fever, inability to tolerate by mouth) that necessitate immediate return. Medications administered to the patient during their visit and any new prescriptions provided to the patient are listed below.  Medications given during this visit Medications  clindamycin (CLEOCIN) capsule 300 mg (has no administration in time range)     The patient appears  reasonably screen and/or stabilized for discharge and I doubt any other medical condition or other [facility] requiring further screening, evaluation, or treatment in the ED at this time prior to discharge.   Final Clinical Impression(s) / ED Diagnoses Final diagnoses:  Cellulitis of right lower extremity    Rx / DC Orders ED Discharge Orders     None        Deno Etienne, DO 12/01/20 1743

## 2020-12-01 NOTE — ED Provider Notes (Signed)
[place]    CSN: [id number] Arrival date & time: 12/01/20  1054      History   Chief Complaint Chief Complaint  Patient presents with   right foot swelling    HPI Donald [name] is a 50 y.o. male.   Patient here for reevaluation of left fourth toe pain and swelling.  Patient was seen here over the weekend and prescribed antibiotics for possible cellulitis.  Reports taking antibiotics as prescribed but states swelling has gotten worse.  Also reports having a fever of 102 last night. Denies any specific alleviating or aggravating factors.  Denies any fevers, chest pain, shortness of breath, N/V/D, numbness, tingling, weakness, abdominal pain, or headaches.     The history is provided by the patient.   Past Medical History:  Diagnosis Date   DEPRESSION, MILD    Dysuria    G E R D    Hypercholesterolemia    Hyperlipidemia    NOT TAKING MEDICATION   Hypertension    Lumbago    NEPHROLITHIASIS, HX OF    PARASOMNIA    Right bundle branch block     Patient Active Problem List   Diagnosis Date Noted   Dyslipidemia, goal LDL below 130 08/25/2020   Encounter for general adult medical examination with abnormal findings 08/19/2020   Prediabetes 08/19/2020   Screen for colon cancer 08/19/2020   Diuretic-induced hypokalemia 08/19/2020   Chronic bilateral low back pain without sciatica 08/19/2020   Right bundle branch block 03/11/2010   Essential hypertension 05/14/2009   PARASOMNIA 05/14/2009   G E R D 05/13/2009    Past Surgical History:  Procedure Laterality Date   CARDIAC CATHETERIZATION N/A 01/19/2016   Procedure: Left Heart Cath and Coronary Angiography;  Surgeon: Sherren Mocha, MD;  Location: [facility];  Service: Cardiovascular;  Laterality: N/A;   HAND SURGERY     right and left hand         Home Medications    Prior to Admission medications   Medication Sig Start Date End Date Taking? Authorizing Provider  amLODipine (NORVASC) 10 MG  tablet Take 1 tablet (10 mg total) by mouth daily. 08/25/20  Yes Janith Lima, MD  aspirin 81 MG tablet Take 1 tablet (81 mg total) by mouth daily. 10/29/15  Yes Jaynee Eagles, PA-C  atorvastatin (LIPITOR) 10 MG tablet Take 1 tablet (10 mg total) by mouth daily. 08/25/20  Yes Janith Lima, MD  clotrimazole (LOTRIMIN) 1 % cream Apply to affected area 2 times daily 11/28/20  Yes [place], Sarah M, PA-C  doxycycline (VIBRAMYCIN) 100 MG capsule Take 1 capsule (100 mg total) by mouth 2 (two) times daily. 11/28/20  Yes Nelwyn Salisbury M, PA-C  etodolac (LODINE XL) 400 MG 24 hr tablet TAKE 1 TABLET BY MOUTH EVERY DAY 11/18/20  Yes Janith Lima, MD  losartan (COZAAR) 100 MG tablet TAKE 1 TABLET EVERY DAY 10/01/20  Yes Janith Lima, MD  oxyCODONE-acetaminophen (PERCOCET) 5-325 MG tablet Take 1 tablet by mouth every 4 (four) hours as needed. 09/02/20  Yes Larene Pickett, PA-C  fluticasone [facility]) 50 MCG/ACT nasal spray USE 1 SPRAY IN BOTH NOSTRILS DAILY 03/27/20   [name], MD    Family History Family History  Problem Relation Age of Onset   Hypertension Mother    Hypertension Sister     Social History Social History   Tobacco Use   Smoking status: Never   Smokeless tobacco: Never  Substance Use Topics  Alcohol use: No    Alcohol/week: 0.0 standard drinks   Drug use: No     Allergies   Ace inhibitors   Review of Systems Review of Systems  Skin:  Positive for wound.  All other systems reviewed and are negative.   Physical Exam Triage Vital Signs ED Triage Vitals  Enc Vitals Group     BP 12/01/20 1135 134/78     Pulse Rate 12/01/20 1135 72     Resp 12/01/20 1135 18     Temp 12/01/20 1135 97.8 F (36.6 C)     Temp src --      SpO2 12/01/20 1135 99 %     Weight --      Height --      Head Circumference --      Peak Flow --      Pain Score 12/01/20 1133 8     Pain Loc --      Pain Edu? --      Excl. in [place]? --    No data found.  Updated Vital Signs BP  134/78   Pulse 72   Temp 97.8 F (36.6 C)   Resp 18   SpO2 99%   Visual Acuity Right Eye Distance:   Left Eye Distance:   Bilateral Distance:    Right Eye Near:   Left Eye Near:    Bilateral Near:     Physical Exam Vitals and nursing note reviewed.  Constitutional:      General: He is not in acute distress.    Appearance: Normal appearance. He is not ill-appearing, toxic-appearing or diaphoretic.  HENT:     Head: Normocephalic and atraumatic.  Eyes:     Conjunctiva/sclera: Conjunctivae normal.  Cardiovascular:     Rate and Rhythm: Normal rate.     Pulses: Normal pulses.  Pulmonary:     Effort: Pulmonary effort is normal.  Abdominal:     General: Abdomen is flat.  Musculoskeletal:        General: Normal range of motion.     Cervical back: Normal range of motion.  Skin:    General: Skin is warm and dry.     Findings: Erythema and wound present.  Neurological:     General: No focal deficit present.     Mental Status: He is alert and oriented to person, place, and time.  Psychiatric:        Mood and Affect: Mood normal.      UC Treatments / Results  Labs (all labs ordered are listed, but only abnormal results are displayed) Labs Reviewed - No data to display  EKG   Radiology No results found.  Procedures Procedures (including critical care time)  Medications Ordered in UC Medications - No data to display  Initial Impression / Assessment and Plan / UC Course  I have reviewed the triage vital signs and the nursing notes.  Pertinent labs & imaging results that were available during my care of the patient were reviewed by me and considered in my medical decision making (see chart for details).    Left foot redness and swelling worsening since Sunday.  X-ray on Sunday showed no signs of osteomyelitis.  Patient has been taking doxycycline twice a day with no improvement.  This patient developed a fever and has worsening symptoms despite antibiotic use  recommend going to the emergency room for further evaluation and possible IV antibiotics.  Patient is stable to be transported to the emergency room via private  vehicle.  Final Clinical Impressions(s) / UC Diagnoses   Final diagnoses:  Cellulitis of left lower extremity     Discharge Instructions      As swelling has gotten worse despite taking antibiotics and you developed a fever, it is recommended that you go to the ED for further evaluation and possible IV antibiotics.      ED Prescriptions   None    PDMP not reviewed this encounter.   Pearson Forster, NP 12/01/20 1148

## 2020-12-03 ENCOUNTER — Telehealth: Payer: Self-pay | Admitting: Gastroenterology

## 2020-12-03 NOTE — Telephone Encounter (Signed)
Hey Dr. Bryan Lemma,   Inbound call from patient. Had to cancel procedure for 7/6 due to him wanting to delay till after MRI. Rescheduled for 8/22.  Thank you!

## 2020-12-08 ENCOUNTER — Encounter: Payer: 59 | Admitting: Gastroenterology

## 2020-12-16 ENCOUNTER — Other Ambulatory Visit (HOSPITAL_COMMUNITY): Payer: Self-pay | Admitting: Surgery

## 2020-12-16 DIAGNOSIS — R1011 Right upper quadrant pain: Secondary | ICD-10-CM

## 2020-12-27 ENCOUNTER — Ambulatory Visit (HOSPITAL_COMMUNITY): Payer: 59 | Attending: Surgery

## 2020-12-27 ENCOUNTER — Encounter (HOSPITAL_COMMUNITY): Payer: Self-pay

## 2021-01-04 ENCOUNTER — Ambulatory Visit (AMBULATORY_SURGERY_CENTER): Payer: 59 | Admitting: *Deleted

## 2021-01-04 ENCOUNTER — Other Ambulatory Visit: Payer: Self-pay

## 2021-01-04 VITALS — Ht 74.0 in | Wt 150.0 lb

## 2021-01-04 DIAGNOSIS — Z1211 Encounter for screening for malignant neoplasm of colon: Secondary | ICD-10-CM

## 2021-01-04 DIAGNOSIS — G8929 Other chronic pain: Secondary | ICD-10-CM

## 2021-01-04 DIAGNOSIS — R1011 Right upper quadrant pain: Secondary | ICD-10-CM

## 2021-01-04 MED ORDER — CLENPIQ 10-3.5-12 MG-GM -GM/160ML PO SOLN: 1.0000 | ORAL | 0 refills | Status: DC

## 2021-01-04 NOTE — Progress Notes (Signed)
Pt verified name, DOB, address and insurance during PV today. Pt mailed instruction packet to included paper to complete and mail back to [facility] with addressed and stamped envelope, Emmi video, copy of consent form to read and not return, and instructions. Clenpiq coupon mailed in packet. PV completed over the phone. Pt encouraged to call with questions or issues. My Chart instructions to pt as well    No egg or soy allergy known to patient  No issues with past sedation with any surgeries or procedures Patient denies ever being told they had issues or difficulty with intubation  No FH of Malignant Hyperthermia No diet pills per patient No home 02 use per patient  No blood thinners per patient  Pt denies issues with constipation  No A fib or A flutter  EMMI video to pt or via [place]ie 19 guidelines implemented in [place] today with Pt and RN  Pt is fully vaccinated  for [employer] given to pt in PV today , Code to Pharmacy and  NO PA's for preps discussed with pt In PV today  Discussed with pt there will be an out-of-pocket cost for prep and that varies from $0 to 70 dollars   Due to the COVID-19 pandemic we are asking patients to follow certain guidelines.  Pt aware of COVID protocols and [facility] guidelines

## 2021-01-24 ENCOUNTER — Encounter: Payer: Self-pay | Admitting: Gastroenterology

## 2021-01-24 ENCOUNTER — Ambulatory Visit (AMBULATORY_SURGERY_CENTER): Payer: 59 | Admitting: Gastroenterology

## 2021-01-24 ENCOUNTER — Other Ambulatory Visit: Payer: Self-pay

## 2021-01-24 VITALS — BP 140/78 | HR 58 | Temp 98.6°F | Resp 18 | Ht 74.0 in | Wt 150.0 lb

## 2021-01-24 DIAGNOSIS — K259 Gastric ulcer, unspecified as acute or chronic, without hemorrhage or perforation: Secondary | ICD-10-CM

## 2021-01-24 DIAGNOSIS — B9681 Helicobacter pylori [H. pylori] as the cause of diseases classified elsewhere: Secondary | ICD-10-CM

## 2021-01-24 DIAGNOSIS — K2970 Gastritis, unspecified, without bleeding: Secondary | ICD-10-CM | POA: Diagnosis not present

## 2021-01-24 DIAGNOSIS — K2980 Duodenitis without bleeding: Secondary | ICD-10-CM

## 2021-01-24 DIAGNOSIS — K2990 Gastroduodenitis, unspecified, without bleeding: Secondary | ICD-10-CM

## 2021-01-24 DIAGNOSIS — D123 Benign neoplasm of transverse colon: Secondary | ICD-10-CM

## 2021-01-24 DIAGNOSIS — G8929 Other chronic pain: Secondary | ICD-10-CM

## 2021-01-24 DIAGNOSIS — Z1211 Encounter for screening for malignant neoplasm of colon: Secondary | ICD-10-CM | POA: Diagnosis not present

## 2021-01-24 MED ORDER — OMEPRAZOLE 40 MG PO CPDR: DELAYED_RELEASE_CAPSULE | ORAL | 3 refills | Status: DC

## 2021-01-24 MED ORDER — SODIUM CHLORIDE 0.9 % IV SOLN: 500.0000 mL | INTRAVENOUS | Status: DC

## 2021-01-24 NOTE — Op Note (Signed)
[name] Procedure Date: 01/24/2021 11:21 AM MRN: [id number] Endoscopist: Gerrit Heck , MD Age: 50 Referring MD:  Date of Birth: Jan 07, 1971 Gender: Male Account #: [id number] Procedure:                Colonoscopy Indications:              Screening for colorectal malignant neoplasm, This                            is the patient's first colonoscopy Medicines:                Monitored Anesthesia Care Procedure:                Pre-Anesthesia Assessment:                           - Prior to the procedure, a History and Physical                            was performed, and patient medications and                            allergies were reviewed. The patient's tolerance of                            previous anesthesia was also reviewed. The risks                            and benefits of the procedure and the sedation                            options and risks were discussed with the patient.                            All questions were answered, and informed consent                            was obtained. Prior Anticoagulants: The patient has                            taken no previous anticoagulant or antiplatelet                            agents except for aspirin. ASA Grade Assessment: II                            - A patient with mild systemic disease. After                            reviewing the risks and benefits, the patient was                            deemed in satisfactory condition to undergo the  procedure.                           After obtaining informed consent, the colonoscope                            was passed under direct vision. Throughout the                            procedure, the patient's blood pressure, pulse, and                            oxygen saturations were monitored continuously. The                            CF [id number] was introduced through the anus                             and advanced to the the cecum, identified by                            appendiceal orifice and ileocecal valve. The                            colonoscopy was performed without difficulty. The                            patient tolerated the procedure well. The quality                            of the bowel preparation was good. The ileocecal                            valve, appendiceal orifice, and rectum were                            photographed. Scope In: 11:44:42 AM Scope Out: 12:01:26 PM Scope Withdrawal Time: 0 hours 11 minutes 5 seconds  Total Procedure Duration: 0 hours 16 minutes 44 seconds  Findings:                 The perianal and digital rectal examinations were                            normal.                           A 5 mm polyp was found in the transverse colon. The                            polyp was sessile. The polyp was removed with a                            cold snare. Resection and retrieval were complete.  Estimated blood loss was minimal.                           The exam was otherwise normal throughout the                            remainder of the colon.                           The retroflexed view of the distal rectum and anal                            verge was normal and showed no anal or rectal                            abnormalities. Complications:            No immediate complications. Estimated Blood Loss:     Estimated blood loss was minimal. Impression:               - One 5 mm polyp in the transverse colon, removed                            with a cold snare. Resected and retrieved.                           - The distal rectum and anal verge are normal on                            retroflexion view. Recommendation:           - Patient has a contact number available for                            emergencies. The signs and symptoms of potential                             delayed complications were discussed with the                            patient. Return to normal activities tomorrow.                            Written discharge instructions were provided to the                            patient.                           - Resume previous diet.                           - Continue present medications.                           - Await pathology results.                           -  Repeat colonoscopy for surveillance based on                            pathology results.                           - Return to GI office PRN. Gerrit Heck, MD 01/24/2021 12:10:00 PM

## 2021-01-24 NOTE — Progress Notes (Signed)
To PACU, VSS. Report to [email or web address] 

## 2021-01-24 NOTE — Progress Notes (Signed)
GASTROENTEROLOGY PROCEDURE H&P NOTE   Primary Care Physician: [name], MD    Reason for Procedure:   1) GERD, RUQ pain 2) Colon cancer screening  Plan:    1) EGD 2) Colonoscopy  Patient is appropriate for endoscopic procedure(s) in the ambulatory ([place]) setting.  The nature of the procedure, as well as the risks, benefits, and alternatives were carefully and thoroughly reviewed with the patient. Ample time for discussion and questions allowed. The patient understood, was satisfied, and agreed to proceed.     HPI: Donald [name] is a 50 y.o. male who presents for EGD and colonoscopy. EGD for evaluation of RUQ pain and hx of GERD. Colonsocopy planned for routine CRC screening.   Was seen in the GI Clinic for these issues on 10/27/2020. No significant changes in clinical hx since that appt.   Prescribed ASA 325 mg/day- takes 1/2 tablet daily.   Past Medical History:  Diagnosis Date   Blood transfusion without reported diagnosis    DEPRESSION, MILD    Dysuria    G E R D    Hypercholesterolemia    Hyperlipidemia    NOT TAKING MEDICATION   Hypertension    Lumbago    NEPHROLITHIASIS, HX OF    PARASOMNIA    Right bundle branch block     Past Surgical History:  Procedure Laterality Date   CARDIAC CATHETERIZATION N/A 01/19/2016   Procedure: Left Heart Cath and Coronary Angiography;  Surgeon: Sherren Mocha, MD;  Location: [facility];  Service: Cardiovascular;  Laterality: N/A;   HAND SURGERY     right and left hand      Prior to Admission medications   Medication Sig Start Date End Date Taking? Authorizing Provider  amLODipine (NORVASC) 10 MG tablet Take 1 tablet (10 mg total) by mouth daily. 08/25/20  Yes Janith Lima, MD  aspirin 325 MG tablet Take 162.5 mg by mouth daily.   Yes [provider]  atorvastatin (LIPITOR) 10 MG tablet Take 1 tablet (10 mg total) by mouth daily. 08/25/20  Yes Janith Lima, MD  hydrochlorothiazide  (HYDRODIURIL) 25 MG tablet Take 25 mg by mouth daily. 01/05/21  Yes [provider]  losartan (COZAAR) 100 MG tablet TAKE 1 TABLET EVERY DAY 10/01/20  Yes Janith Lima, MD  fluticasone [facility]) 50 MCG/ACT nasal spray USE 1 SPRAY IN BOTH NOSTRILS DAILY Patient not taking: Reported on 01/24/2021 03/27/20   [name], MD  oxyCODONE-acetaminophen (PERCOCET) 5-325 MG tablet Take 1 tablet by mouth every 4 (four) hours as needed. Patient not taking: Reported on 01/24/2021 09/02/20   Larene Pickett, PA-C    Current Outpatient Medications  Medication Sig Dispense Refill   amLODipine (NORVASC) 10 MG tablet Take 1 tablet (10 mg total) by mouth daily. 90 tablet 1   aspirin 325 MG tablet Take 162.5 mg by mouth daily.     atorvastatin (LIPITOR) 10 MG tablet Take 1 tablet (10 mg total) by mouth daily. 90 tablet 1   hydrochlorothiazide (HYDRODIURIL) 25 MG tablet Take 25 mg by mouth daily.     losartan (COZAAR) 100 MG tablet TAKE 1 TABLET EVERY DAY 90 tablet 1   fluticasone (FLONASE) 50 MCG/ACT nasal spray USE 1 SPRAY IN BOTH NOSTRILS DAILY (Patient not taking: Reported on 01/24/2021) 48 mL 2   oxyCODONE-acetaminophen (PERCOCET) 5-325 MG tablet Take 1 tablet by mouth every 4 (four) hours as needed. (Patient not taking: Reported on 01/24/2021) 20 tablet 0  Current Facility-Administered Medications  Medication Dose Route Frequency Provider Last Rate Last Admin   0.9 %  sodium chloride infusion  500 mL Intravenous Continuous Mariabelen Pressly V, DO        Allergies as of 01/24/2021 - Review Complete 01/24/2021  Allergen Reaction Noted   Ace inhibitors Cough 12/12/2013    Family History  Problem Relation Age of Onset   Hypertension Mother    Hypertension Sister     Social History   Socioeconomic History   Marital status: Married    Spouse name: Not on file   Number of children: 2   Years of education: 15   Highest education level: Not on file  Occupational History   Occupation:  Glass blower/designer    Employer: [employer]  Tobacco Use   Smoking status: Never   Smokeless tobacco: Never  Substance and Sexual Activity   Alcohol use: No    Alcohol/week: 0.0 standard drinks   Drug use: No   Sexual activity: Not on file  Other Topics Concern   Not on file  Social History Narrative   ** Merged History Encounter **       Social Determinants of Health   Financial Resource Strain: Not on file  Food Insecurity: Not on file  Transportation Needs: Not on file  Physical Activity: Not on file  Stress: Not on file  Social Connections: Not on file  Intimate Partner Violence: Not on file    Physical Exam: Vital signs in last 24 hours: '@BP'$  137/82   Pulse 73   Temp 98.6 F (37 C)   Resp (!) 8   Ht '6\' 2"'$  (1.88 m)   Wt 150 lb (68 kg)   SpO2 100%   BMI 19.26 kg/m  GEN: NAD EYE: Sclerae anicteric ENT: MMM CV: Non-tachycardic Pulm: CTA b/l GI: Soft, NT/ND NEURO:  Alert & Oriented x [place], DO [facility]   01/24/2021 11:27 AM

## 2021-01-24 NOTE — Op Note (Signed)
[place] Patient Name: Donald [name] Procedure Date: 01/24/2021 11:22 AM MRN: [id number] Endoscopist: Gerrit Heck , MD Age: 50 Referring MD:  Date of Birth: December 16, 1970 Gender: Male Account #: [id number] Procedure:                Upper GI endoscopy Indications:              Abdominal pain in the right upper quadrant,                            Suspected esophageal reflux Medicines:                Monitored Anesthesia Care Procedure:                Pre-Anesthesia Assessment:                           - Prior to the procedure, a History and Physical                            was performed, and patient medications and                            allergies were reviewed. The patient's tolerance of                            previous anesthesia was also reviewed. The risks                            and benefits of the procedure and the sedation                            options and risks were discussed with the patient.                            All questions were answered, and informed consent                            was obtained. Prior Anticoagulants: The patient has                            taken no previous anticoagulant or antiplatelet                            agents except for aspirin. ASA Grade Assessment: II                            - A patient with mild systemic disease. After                            reviewing the risks and benefits, the patient was                            deemed in satisfactory condition to undergo the  procedure.                           After obtaining informed consent, the endoscope was                            passed under direct vision. Throughout the                            procedure, the patient's blood pressure, pulse, and                            oxygen saturations were monitored continuously. The                            GIF [id number] [id number] was introduced through the                             mouth, and advanced to the second part of duodenum.                            The upper GI endoscopy was accomplished without                            difficulty. The patient tolerated the procedure                            well. Scope In: Scope Out: Findings:                 The examined esophagus was normal.                           The Z-line was regular and was found 45 cm from the                            incisors.                           Localized mild inflammation characterized by                            erosions and erythema was found in the gastric                            antrum. Biopsies were taken with a cold forceps for                            histology. Estimated blood loss was minimal.                           The gastric fundus and gastric body were normal.                           Localized mildly erythematous mucosa without active  bleeding was found in the duodenal bulb. Biopsies                            were taken with a cold forceps for histology.                            Estimated blood loss was minimal.                           The second portion of the duodenum was normal. Complications:            No immediate complications. Estimated Blood Loss:     Estimated blood loss was minimal. Impression:               - Normal esophagus.                           - Z-line regular, 45 cm from the incisors.                           - Gastritis. Biopsied.                           - Normal gastric fundus and gastric body.                           - Erythematous duodenopathy. Biopsied.                           - Normal second portion of the duodenum. Recommendation:           - Patient has a contact number available for                            emergencies. The signs and symptoms of potential                            delayed complications were discussed with the                            patient. Return  to normal activities tomorrow.                            Written discharge instructions were provided to the                            patient.                           - Resume previous diet.                           - Continue present medications.                           - Await pathology results.                           -  Use Prilosec (omeprazole) 40 mg PO BID for 6                            weeks to promote mucosal healing, then reduce to 40                            mg daily and titrate to lowest effective dose, or                            off completely if GI symptoms resolve.                           - Perform a colonoscopy today. Gerrit Heck, MD 01/24/2021 12:06:57 PM

## 2021-01-24 NOTE — Progress Notes (Signed)
Pt's states no medical or surgical changes since previsit or office visit.   DT VS

## 2021-01-24 NOTE — Progress Notes (Signed)
Called to room to assist during endoscopic procedure.  Patient ID and intended procedure confirmed with present staff. Received instructions for my participation in the procedure from the performing physician.  

## 2021-01-24 NOTE — Patient Instructions (Addendum)
YOU HAD AN ENDOSCOPIC PROCEDURE TODAY AT [facility]:   Refer to the procedure report that was given to you for any specific questions about what was found during the examination.  If the procedure report does not answer your questions, please call your gastroenterologist to clarify.  If you requested that your care partner not be given the details of your procedure findings, then the procedure report has been included in a sealed envelope for you to review at your convenience later.  YOU SHOULD EXPECT: Some feelings of bloating in the abdomen. Passage of more gas than usual.  Walking can help get rid of the air that was put into your GI tract during the procedure and reduce the bloating. If you had a lower endoscopy (such as a colonoscopy or flexible sigmoidoscopy) you may notice spotting of blood in your stool or on the toilet paper. If you underwent a bowel prep for your procedure, you may not have a normal bowel movement for a few days.  Please Note:  You might notice some irritation and congestion in your nose or some drainage.  This is from the oxygen used during your procedure.  There is no need for concern and it should clear up in a day or so.  SYMPTOMS TO REPORT IMMEDIATELY:  Following lower endoscopy (colonoscopy or flexible sigmoidoscopy):  Excessive amounts of blood in the stool  Significant tenderness or worsening of abdominal pains  Swelling of the abdomen that is new, acute  Fever of 100F or higher  Following upper endoscopy (EGD)  Vomiting of blood or coffee ground material  New chest pain or pain under the shoulder blades  Painful or persistently difficult swallowing  New shortness of breath  Fever of 100F or higher  Black, tarry-looking stools  For urgent or emergent issues, a gastroenterologist can be reached at any hour by calling [phone]. Do not use MyChart messaging for urgent concerns.    DIET:  We do recommend a small meal at first, but  then you may proceed to your regular diet.  Drink plenty of fluids but you should avoid alcoholic beverages for 24 hours.  ACTIVITY:  You should plan to take it easy for the rest of today and you should NOT DRIVE or use heavy machinery until tomorrow (because of the sedation medicines used during the test).    FOLLOW UP: Our staff will call the number listed on your records 48-72 hours following your procedure to check on you and address any questions or concerns that you may have regarding the information given to you following your procedure. If we do not reach you, we will leave a message.  We will attempt to reach you two times.  During this call, we will ask if you have developed any symptoms of COVID 19. If you develop any symptoms (ie: fever, flu-like symptoms, shortness of breath, cough etc.) before then, please call [phone].  If you test positive for Covid 19 in the 2 weeks post procedure, please call and report this information to [place].    If any biopsies were taken you will be contacted by phone or by letter within the next 1-3 weeks.  Please call us at [phone] if you have not heard about the biopsies in 3 weeks.    SIGNATURES/CONFIDENTIALITY: You and/or your care partner have signed paperwork which will be entered into your electronic medical record.  These signatures attest to the fact that that the information above on your After  Visit Summary has been reviewed and is understood.  Full responsibility of the confidentiality of this discharge information lies with you and/or your care-partner.   Resume medications. Information given on polyps and gastritis.

## 2021-01-26 ENCOUNTER — Telehealth: Payer: Self-pay | Admitting: *Deleted

## 2021-01-26 NOTE — Telephone Encounter (Signed)
  Follow up Call-  Call back number 01/24/2021  Post procedure Call Back phone  # [phone]  Permission to leave phone message Yes  Some recent data might be hidden     Patient questions:  Do you have a fever, pain , or abdominal swelling? No. Pain Score  0 *  Have you tolerated food without any problems? Yes.    Have you been able to return to your normal activities? Yes.    Do you have any questions about your discharge instructions: Diet   No. Medications  No. Follow up visit  No.  Do you have questions or concerns about your Care? No.  Actions: * If pain score is 4 or above: No action needed, pain <4.  Have you developed a fever since your procedure? no  2.   Have you had an respiratory symptoms (SOB or cough) since your procedure? no  3.   Have you tested positive for COVID 19 since your procedure no  4.   Have you had any family members/close contacts diagnosed with the COVID 19 since your procedure?  no   If yes to any of these questions please route to Joylene John, RN and Joella Prince, RN

## 2021-01-27 ENCOUNTER — Telehealth: Payer: Self-pay

## 2021-01-27 DIAGNOSIS — K2970 Gastritis, unspecified, without bleeding: Secondary | ICD-10-CM

## 2021-01-27 DIAGNOSIS — B9681 Helicobacter pylori [H. pylori] as the cause of diseases classified elsewhere: Secondary | ICD-10-CM

## 2021-01-27 MED ORDER — DOXYCYCLINE HYCLATE 100 MG PO TABS: 100.0000 mg | ORAL_TABLET | Freq: Two times a day (BID) | ORAL | 0 refills | Status: AC

## 2021-01-27 MED ORDER — METRONIDAZOLE 250 MG PO TABS: 250.0000 mg | ORAL_TABLET | Freq: Four times a day (QID) | ORAL | 0 refills | Status: AC

## 2021-01-27 MED ORDER — BISMUTH SUBSALICYLATE 262 MG PO CHEW: 524.0000 mg | CHEWABLE_TABLET | Freq: Four times a day (QID) | ORAL | 0 refills | Status: AC

## 2021-01-27 NOTE — Telephone Encounter (Signed)
Spoke with patient and he aware he has to be off the PPI 2 weeks prior to doing the stool specimen. He voiced understanding

## 2021-01-27 NOTE — Telephone Encounter (Signed)
-----   Message from [place], DO sent at 01/27/2021  3:48 PM EDT ----- Please review the pathology results from the recent colonoscopy/upper endoscopy as follows: - The polyp removed during the colonoscopy was a Tubular Adenoma.  This is considered benign, but precancerous.  This was removed entirely at time of colonoscopy.  Recommend repeat colonoscopy in 7 years for ongoing surveillance.  -The biopsies from the recent upper GI Endoscopy were notable for H. Pylori gastritis and duodenitis, and will plan on treating with quad therapy as below. Please confirm no medication allergies to the prescribed regimen.   1) Continue omeprazole 40 mg 2 times a day which was prescribed at the time of upper endoscopy 2) Pepto Bismol 2 tabs (262 mg each) 4 times a day x 14 d 3) Metronidazole 250 mg 4 times a day x 14 d 4) doxycycline 100 mg 2 times a day x 14 d  After 14 days, ok to stop omeprazole.  4 weeks after treatment completed, check H. Pylori stool antigen to confirm eradication (must be off acid suppression therapy)  Dx: H. Pylori gastritis

## 2021-01-27 NOTE — Telephone Encounter (Signed)
Medications sent and lab set for [place]. Recall set for 01-2028.  I tried to call pt 4 times and each time they would play on the phone by laughing on it and no one would speak into the phone

## 2021-02-23 ENCOUNTER — Other Ambulatory Visit: Payer: Self-pay | Admitting: Internal Medicine

## 2021-02-23 DIAGNOSIS — M545 Low back pain, unspecified: Secondary | ICD-10-CM

## 2021-02-23 DIAGNOSIS — G8929 Other chronic pain: Secondary | ICD-10-CM

## 2021-03-26 ENCOUNTER — Other Ambulatory Visit: Payer: Self-pay | Admitting: Internal Medicine

## 2021-03-26 DIAGNOSIS — I10 Essential (primary) hypertension: Secondary | ICD-10-CM

## 2021-03-29 ENCOUNTER — Other Ambulatory Visit: Payer: Self-pay | Admitting: Internal Medicine

## 2021-03-29 DIAGNOSIS — G8929 Other chronic pain: Secondary | ICD-10-CM

## 2021-03-29 DIAGNOSIS — M545 Low back pain, unspecified: Secondary | ICD-10-CM

## 2021-04-02 ENCOUNTER — Encounter (HOSPITAL_COMMUNITY): Payer: Self-pay | Admitting: Emergency Medicine

## 2021-04-02 ENCOUNTER — Other Ambulatory Visit: Payer: Self-pay

## 2021-04-02 ENCOUNTER — Emergency Department (HOSPITAL_COMMUNITY): Payer: 59

## 2021-04-02 ENCOUNTER — Observation Stay (HOSPITAL_COMMUNITY)
Admission: EM | Admit: 2021-04-02 | Discharge: 2021-04-02 | Disposition: A | Discharge status: Home / Self Care | Payer: 59 | Attending: Internal Medicine | Admitting: Internal Medicine

## 2021-04-02 DIAGNOSIS — T502X5A Adverse effect of carbonic-anhydrase inhibitors, benzothiadiazides and other diuretics, initial encounter: Secondary | ICD-10-CM

## 2021-04-02 DIAGNOSIS — R079 Chest pain, unspecified: Principal | ICD-10-CM | POA: Insufficient documentation

## 2021-04-02 DIAGNOSIS — Z79899 Other long term (current) drug therapy: Secondary | ICD-10-CM | POA: Diagnosis not present

## 2021-04-02 DIAGNOSIS — I10 Essential (primary) hypertension: Secondary | ICD-10-CM | POA: Insufficient documentation

## 2021-04-02 DIAGNOSIS — R072 Precordial pain: Secondary | ICD-10-CM

## 2021-04-02 DIAGNOSIS — Z7982 Long term (current) use of aspirin: Secondary | ICD-10-CM | POA: Diagnosis not present

## 2021-04-02 DIAGNOSIS — E876 Hypokalemia: Secondary | ICD-10-CM | POA: Insufficient documentation

## 2021-04-02 DIAGNOSIS — I2510 Atherosclerotic heart disease of native coronary artery without angina pectoris: Secondary | ICD-10-CM | POA: Insufficient documentation

## 2021-04-02 DIAGNOSIS — R7303 Prediabetes: Secondary | ICD-10-CM | POA: Insufficient documentation

## 2021-04-02 DIAGNOSIS — E785 Hyperlipidemia, unspecified: Secondary | ICD-10-CM | POA: Diagnosis present

## 2021-04-02 DIAGNOSIS — R0789 Other chest pain: Secondary | ICD-10-CM | POA: Diagnosis present

## 2021-04-02 DIAGNOSIS — E669 Obesity, unspecified: Secondary | ICD-10-CM

## 2021-04-02 DIAGNOSIS — Z20822 Contact with and (suspected) exposure to covid-19: Secondary | ICD-10-CM | POA: Diagnosis not present

## 2021-04-02 DIAGNOSIS — I4510 Unspecified right bundle-branch block: Secondary | ICD-10-CM | POA: Diagnosis present

## 2021-04-02 LAB — HEPATIC FUNCTION PANEL
ALT: 21 U/L (ref 0–44)
AST: 22 U/L (ref 15–41)
Albumin: 3.9 g/dL (ref 3.5–5.0)
Alkaline Phosphatase: 42 U/L (ref 38–126)
Bilirubin, Direct: <0.1 mg/dL (ref 0.0–0.2)
Total Bilirubin: 0.9 mg/dL (ref 0.3–1.2)
Total Protein: 7.1 g/dL (ref 6.5–8.1)

## 2021-04-02 LAB — CBC
HCT: 40.3 % (ref 39.0–52.0)
Hemoglobin: 13.2 g/dL (ref 13.0–17.0)
MCH: 27.7 pg (ref 26.0–34.0)
MCHC: 32.8 g/dL (ref 30.0–36.0)
MCV: 84.7 fL (ref 80.0–100.0)
Platelets: 302 10*3/uL (ref 150–400)
RBC: 4.76 MIL/uL (ref 4.22–5.81)
RDW: 14.1 % (ref 11.5–15.5)
WBC: 5.8 10*3/uL (ref 4.0–10.5)
nRBC: 0 % (ref 0.0–0.2)

## 2021-04-02 LAB — PROTIME-INR
INR: 1 (ref 0.8–1.2)
Prothrombin Time: 13.6 seconds (ref 11.4–15.2)

## 2021-04-02 LAB — LIPID PANEL
Cholesterol: 232 mg/dL — ABNORMAL HIGH (ref 0–200)
HDL: 52 mg/dL (ref >40)
LDL Cholesterol: 172 mg/dL — ABNORMAL HIGH (ref 0–99)
Total CHOL/HDL Ratio: 4.5 RATIO
Triglycerides: 38 mg/dL (ref <150)
VLDL: 8 mg/dL (ref 0–40)

## 2021-04-02 LAB — BASIC METABOLIC PANEL
Anion gap: 9 (ref 5–15)
BUN: 14 mg/dL (ref 6–20)
CO2: 25 mmol/L (ref 22–32)
Calcium: 9.3 mg/dL (ref 8.9–10.3)
Chloride: 103 mmol/L (ref 98–111)
Creatinine, Ser: 1.06 mg/dL (ref 0.61–1.24)
GFR, Estimated: >60 mL/min (ref >60)
Glucose, Bld: 110 mg/dL — ABNORMAL HIGH (ref 70–99)
Potassium: 3.3 mmol/L — ABNORMAL LOW (ref 3.5–5.1)
Sodium: 137 mmol/L (ref 135–145)

## 2021-04-02 LAB — TROPONIN I (HIGH SENSITIVITY)
Troponin I (High Sensitivity): 3 ng/L (ref <18)
Troponin I (High Sensitivity): 3 ng/L (ref <18)

## 2021-04-02 LAB — RESP PANEL BY RT-PCR (FLU A&B, COVID) ARPGX2
Influenza A by PCR: NEGATIVE
Influenza B by PCR: NEGATIVE
SARS Coronavirus 2 by RT PCR: NEGATIVE

## 2021-04-02 LAB — D-DIMER, QUANTITATIVE: D-Dimer, Quant: <0.27 ug/mL-FEU (ref 0.00–0.50)

## 2021-04-02 LAB — HIV ANTIBODY (ROUTINE TESTING W REFLEX): HIV Screen 4th Generation wRfx: NONREACTIVE

## 2021-04-02 LAB — LIPASE, BLOOD: Lipase: 31 U/L (ref 11–51)

## 2021-04-02 MED ORDER — HYDROCHLOROTHIAZIDE 25 MG PO TABS
25.0000 mg | ORAL_TABLET | Freq: Every day | ORAL | Status: DC
  Administered 2021-04-02: 25 mg via ORAL
  Filled 2021-04-02: qty 1

## 2021-04-02 MED ORDER — ONDANSETRON HCL 4 MG/2ML IJ SOLN
4.0000 mg | Freq: Once | INTRAMUSCULAR | Status: AC
  Administered 2021-04-02: 4 mg via INTRAVENOUS
  Filled 2021-04-02: qty 2

## 2021-04-02 MED ORDER — AMLODIPINE BESYLATE 5 MG PO TABS
10.0000 mg | ORAL_TABLET | Freq: Every day | ORAL | Status: DC
  Administered 2021-04-02: 10 mg via ORAL
  Filled 2021-04-02: qty 2

## 2021-04-02 MED ORDER — ATORVASTATIN CALCIUM 40 MG PO TABS
40.0000 mg | ORAL_TABLET | Freq: Every day | ORAL | Status: DC
  Administered 2021-04-02: 40 mg via ORAL
  Filled 2021-04-02: qty 1

## 2021-04-02 MED ORDER — ENOXAPARIN SODIUM 60 MG/0.6ML IJ SOSY
60.0000 mg | PREFILLED_SYRINGE | INTRAMUSCULAR | Status: DC
  Administered 2021-04-02: 60 mg via SUBCUTANEOUS
  Filled 2021-04-02 (×2): qty 0.6

## 2021-04-02 MED ORDER — MORPHINE SULFATE (PF) 4 MG/ML IV SOLN
4.0000 mg | Freq: Once | INTRAVENOUS | Status: AC
  Administered 2021-04-02: 4 mg via INTRAVENOUS
  Filled 2021-04-02: qty 1

## 2021-04-02 MED ORDER — POTASSIUM CHLORIDE CRYS ER 20 MEQ PO TBCR
40.0000 meq | EXTENDED_RELEASE_TABLET | ORAL | Status: AC
  Administered 2021-04-02: 40 meq via ORAL
  Filled 2021-04-02: qty 2

## 2021-04-02 MED ORDER — ACETAMINOPHEN 325 MG PO TABS: 650.0000 mg | ORAL_TABLET | ORAL | Status: DC | PRN

## 2021-04-02 MED ORDER — NITROGLYCERIN 0.4 MG SL SUBL: 0.4000 mg | SUBLINGUAL_TABLET | Freq: Once | SUBLINGUAL | Status: DC

## 2021-04-02 MED ORDER — ATORVASTATIN CALCIUM 40 MG PO TABS: 40.0000 mg | ORAL_TABLET | Freq: Every day | ORAL | 0 refills | Status: DC

## 2021-04-02 MED ORDER — ASPIRIN 325 MG PO TABS
162.5000 mg | ORAL_TABLET | Freq: Every day | ORAL | Status: DC
  Filled 2021-04-02: qty 1

## 2021-04-02 MED ORDER — LOSARTAN POTASSIUM 50 MG PO TABS
100.0000 mg | ORAL_TABLET | Freq: Every day | ORAL | Status: DC
  Administered 2021-04-02: 100 mg via ORAL
  Filled 2021-04-02: qty 2

## 2021-04-02 MED ORDER — NITROGLYCERIN 0.4 MG SL SUBL: 0.4000 mg | SUBLINGUAL_TABLET | SUBLINGUAL | Status: DC | PRN

## 2021-04-02 MED ORDER — ONDANSETRON HCL 4 MG/2ML IJ SOLN: 4.0000 mg | Freq: Four times a day (QID) | INTRAMUSCULAR | Status: DC | PRN

## 2021-04-02 NOTE — Discharge Summary (Addendum)
Physician Discharge Summary  Donald [name] MRN: [id number] DOB/AGE: February 03, 1971 50 y.o.  PCP: [name], MD   Admit date: 04/02/2021 Discharge date: 04/02/2021  Discharge Diagnoses:   Principal Problem:   Chest pain Active Problems:   Essential hypertension   Right bundle branch block   Prediabetes   Diuretic-induced hypokalemia   Dyslipidemia, goal LDL below 130    Follow-up recommendations -Follow-up with PCP in 1-2 weeks, including all  additional recommended appointments as below -Will need to repeat lipid and liver enzyme check in 8 weeks after increasing atorvastatin dose    Allergies as of 04/02/2021       Reactions   Ace Inhibitors Cough   Lisinopril caused cough        Medication List     STOP taking these medications    oxyCODONE-acetaminophen 5-325 MG tablet Commonly known as: Percocet       TAKE these medications    amLODipine 10 MG tablet Commonly known as: NORVASC Take 1 tablet (10 mg total) by mouth daily.   aspirin 325 MG tablet Take 162.5 mg by mouth at bedtime.   atorvastatin 40 MG tablet Commonly known as: LIPITOR Take 1 tablet (40 mg total) by mouth daily. Start taking on: April 03, 2021 What changed:  medication strength how much to take   fluticasone 50 MCG/ACT nasal spray Commonly known as: FLONASE USE 1 SPRAY IN BOTH NOSTRILS DAILY What changed: See the new instructions.   hydrochlorothiazide 25 MG tablet Commonly known as: HYDRODIURIL Take 25 mg by mouth daily.   losartan 100 MG tablet Commonly known as: COZAAR TAKE 1 TABLET EVERY DAY   omeprazole 40 MG capsule Commonly known as: PRILOSEC Take 40 mg bid for 6 weeks to promote mucosal healing,then reduce to 40 mg daily and titrate to lowest effective dose,or off completely if GI Symptoms resolve. What changed:  how much to take how to take this when to take this reasons to take this additional instructions   Vitamin D3 50 MCG (2000 UT)  Tabs Take 2,000 Units by mouth daily.         Discharge Condition: Stable   Disposition: Discharge disposition: 01-Home or Self Care        Consults: Cardiology -Dr. Kirk Ruths    Significant Diagnostic Studies:  DG Chest 2 View  Result Date: 04/02/2021 CLINICAL DATA:  Chest pain EXAM: CHEST - 2 VIEW COMPARISON:  None. FINDINGS: Lungs are clear.  No pleural effusion or pneumothorax. The heart is normal in size. Visualized osseous structures are within normal limits. IMPRESSION: Normal chest radiographs. Electronically Signed   By: Julian Hy M.D.   On: 04/02/2021 01:49     Filed Weights   04/02/21 0047  Weight: 125 kg     Microbiology: Recent Results (from the past 240 hour(s))  Resp Panel by RT-PCR (Flu A&B, Covid) Nasopharyngeal Swab     Status: None   Collection Time: 04/02/21  4:31 AM   Specimen: Nasopharyngeal Swab; Nasopharyngeal(NP) swabs in vial transport medium  Result Value Ref Range Status   SARS Coronavirus 2 by RT PCR NEGATIVE NEGATIVE Final    Comment: (NOTE) SARS-CoV-2 target nucleic acids are NOT DETECTED.  The SARS-CoV-2 RNA is generally detectable in upper respiratory specimens during the acute phase of infection. The lowest concentration of SARS-CoV-2 viral copies this assay can detect is 138 copies/mL. A negative result does not preclude SARS-Cov-2 infection and should not be used as the sole basis for treatment or other patient  management decisions. A negative result may occur with  improper specimen collection/handling, submission of specimen other than nasopharyngeal swab, presence of viral mutation(s) within the areas targeted by this assay, and inadequate number of viral copies(<138 copies/mL). A negative result must be combined with clinical observations, patient history, and epidemiological information. The expected result is Negative.  Fact Sheet for Patients:  EntrepreneurPulse.com.au  Fact Sheet  for Healthcare Providers:  IncredibleEmployment.be  This test is no t yet approved or cleared by the [place] FDA and  has been authorized for detection and/or diagnosis of SARS-CoV-2 by FDA under an Emergency Use Authorization (EUA). This EUA will remain  in effect (meaning this test can be used) for the duration of the COVID-19 declaration under Section 564(b)(1) of the Act, 21 U.S.C.section 360bbb-3(b)(1), unless the authorization is terminated  or revoked sooner.       Influenza A by PCR NEGATIVE NEGATIVE Final   Influenza B by PCR NEGATIVE NEGATIVE Final    Comment: (NOTE) The Xpert Xpress SARS-CoV-2/FLU/RSV plus assay is intended as an aid in the diagnosis of influenza from Nasopharyngeal swab specimens and should not be used as a sole basis for treatment. Nasal washings and aspirates are unacceptable for Xpert Xpress SARS-CoV-2/FLU/RSV testing.  Fact Sheet for Patients: EntrepreneurPulse.com.au  Fact Sheet for Healthcare Providers: IncredibleEmployment.be  This test is not yet approved or cleared by the [place] FDA and has been authorized for detection and/or diagnosis of SARS-CoV-2 by FDA under an Emergency Use Authorization (EUA). This EUA will remain in effect (meaning this test can be used) for the duration of the COVID-19 declaration under Section 564(b)(1) of the Act, 21 U.S.C. section 360bbb-3(b)(1), unless the authorization is terminated or revoked.  Performed at [facility], [place] [address] [place], [place] [address]        Blood Culture    Component Value Date/Time   SDES NASOPHARYNGEAL 04/04/2009 0702   SPECREQUEST NONE 04/04/2009 0702   CULT NO STAPHYLOCOCCUS AUREUS ISOLATED 04/04/2009 0702   REPTSTATUS 04/07/2009 FINAL 04/04/2009 0702      Labs: Results for orders placed or performed during the hospital encounter of 04/02/21 (from the past 48 hour(s))  Basic metabolic  panel     Status: Abnormal   Collection Time: 04/02/21 12:54 AM  Result Value Ref Range   Sodium 137 135 - 145 mmol/L   Potassium 3.3 (L) 3.5 - 5.1 mmol/L   Chloride 103 98 - 111 mmol/L   CO2 25 22 - 32 mmol/L   Glucose, Bld 110 (H) 70 - 99 mg/dL    Comment: Glucose reference range applies only to samples taken after fasting for at least 8 hours.   BUN 14 6 - 20 mg/dL   Creatinine, Ser 1.06 0.61 - 1.24 mg/dL   Calcium 9.3 8.9 - 10.3 mg/dL   GFR, Estimated >60 >60 mL/min    Comment: (NOTE) Calculated using the CKD-EPI Creatinine Equation (2021)    Anion gap 9 5 - 15    Comment: Performed at [facility] [address], [place], [place] [address]  CBC     Status: None   Collection Time: 04/02/21 12:54 AM  Result Value Ref Range   WBC 5.8 4.0 - 10.5 K/uL   RBC 4.76 4.22 - 5.81 MIL/uL   Hemoglobin 13.2 13.0 - 17.0 g/dL   HCT 40.3 39.0 - 52.0 %   MCV 84.7 80.0 - 100.0 fL   MCH 27.7 26.0 - 34.0 pg   MCHC  32.8 30.0 - 36.0 g/dL   RDW 14.1 11.5 - 15.5 %   Platelets 302 150 - 400 K/uL   nRBC 0.0 0.0 - 0.2 %    Comment: Performed at [facility], [place] [address] [place], [place] [address]  Troponin I (High Sensitivity)     Status: None   Collection Time: 04/02/21 12:54 AM  Result Value Ref Range   Troponin I (High Sensitivity) 3 <18 ng/L    Comment: (NOTE) Elevated high sensitivity troponin I (hsTnI) values and significant  changes across serial measurements may suggest ACS but many other  chronic and acute conditions are known to elevate hsTnI results.  Refer to the "Links" section for chest pain algorithms and additional  guidance. Performed at [facility], [place] [address], [place], [place] [address]   Protime-INR (order if Patient is taking Coumadin / Warfarin)     Status: None   Collection Time: 04/02/21 12:54 AM  Result Value Ref Range   Prothrombin Time 13.6 11.4 - 15.2 seconds   INR 1.0 0.8 - 1.2    Comment: (NOTE) INR goal varies based on device and  disease states. Performed at [facility], [place] [address] [place], [place] [address]   Hepatic function panel     Status: None   Collection Time: 04/02/21  2:54 AM  Result Value Ref Range   Total Protein 7.1 6.5 - 8.1 g/dL   Albumin 3.9 3.5 - 5.0 g/dL   AST 22 15 - 41 U/L   ALT 21 0 - 44 U/L   Alkaline Phosphatase 42 38 - 126 U/L   Total Bilirubin 0.9 0.3 - 1.2 mg/dL   Bilirubin, Direct <0.1 0.0 - 0.2 mg/dL   Indirect Bilirubin NOT CALCULATED 0.3 - 0.9 mg/dL    Comment: Performed at [facility] [address], [place], [place] [address]  Lipase, blood     Status: None   Collection Time: 04/02/21  2:54 AM  Result Value Ref Range   Lipase 31 11 - 51 U/L    Comment: Performed at [facility] [address], [place], [place] [address]  Troponin I (High Sensitivity)     Status: None   Collection Time: 04/02/21  2:54 AM  Result Value Ref Range   Troponin I (High Sensitivity) 3 <18 ng/L    Comment: (NOTE) Elevated high sensitivity troponin I (hsTnI) values and significant  changes across serial measurements may suggest ACS but many other  chronic and acute conditions are known to elevate hsTnI results.  Refer to the Links section for chest pain algorithms and additional  guidance. Performed at [facility], [place] [address] [place], [place] [address]   Resp Panel by RT-PCR (Flu A&B, Covid) Nasopharyngeal Swab     Status: None   Collection Time: 04/02/21  4:31 AM   Specimen: Nasopharyngeal Swab; Nasopharyngeal(NP) swabs in vial transport medium  Result Value Ref Range   SARS Coronavirus 2 by RT PCR NEGATIVE NEGATIVE    Comment: (NOTE) SARS-CoV-2 target nucleic acids are NOT DETECTED.  The SARS-CoV-2 RNA is generally detectable in upper respiratory specimens during the acute phase of infection. The lowest concentration of SARS-CoV-2 viral copies this assay can detect is 138 copies/mL. A negative result does not preclude SARS-Cov-2 infection and should  not be used as the sole basis for treatment or other patient management decisions. A negative result may occur with  improper specimen collection/handling, submission of specimen  other than nasopharyngeal swab, presence of viral mutation(s) within the areas targeted by this assay, and inadequate number of viral copies(<138 copies/mL). A negative result must be combined with clinical observations, patient history, and epidemiological information. The expected result is Negative.  Fact Sheet for Patients:  EntrepreneurPulse.com.au  Fact Sheet for Healthcare Providers:  IncredibleEmployment.be  This test is no t yet approved or cleared by the [place] FDA and  has been authorized for detection and/or diagnosis of SARS-CoV-2 by FDA under an Emergency Use Authorization (EUA). This EUA will remain  in effect (meaning this test can be used) for the duration of the COVID-19 declaration under Section 564(b)(1) of the Act, 21 U.S.C.section 360bbb-3(b)(1), unless the authorization is terminated  or revoked sooner.       Influenza A by PCR NEGATIVE NEGATIVE   Influenza B by PCR NEGATIVE NEGATIVE    Comment: (NOTE) The Xpert Xpress SARS-CoV-2/FLU/RSV plus assay is intended as an aid in the diagnosis of influenza from Nasopharyngeal swab specimens and should not be used as a sole basis for treatment. Nasal washings and aspirates are unacceptable for Xpert Xpress SARS-CoV-2/FLU/RSV testing.  Fact Sheet for Patients: EntrepreneurPulse.com.au  Fact Sheet for Healthcare Providers: IncredibleEmployment.be  This test is not yet approved or cleared by the [place] FDA and has been authorized for detection and/or diagnosis of SARS-CoV-2 by FDA under an Emergency Use Authorization (EUA). This EUA will remain in effect (meaning this test can be used) for the duration of the COVID-19 declaration under Section 564(b)(1)  of the Act, 21 U.S.C. section 360bbb-3(b)(1), unless the authorization is terminated or revoked.  Performed at [facility], [place] [address], [place], [place] [address]   D-dimer, quantitative     Status: None   Collection Time: 04/02/21  4:40 AM  Result Value Ref Range   D-Dimer, Quant <0.27 0.00 - 0.50 ug/mL-FEU    Comment: (NOTE) At the manufacturer cut-off value of 0.5 g/mL FEU, this assay has a negative predictive value of 95-100%.This assay is intended for use in conjunction with a clinical pretest probability (PTP) assessment model to exclude pulmonary embolism (PE) and deep venous thrombosis (DVT) in outpatients suspected of PE or DVT. Results should be correlated with clinical presentation. Performed at [facility], [place] [address] [place], [place] [address]   HIV Antibody (routine testing w rflx)     Status: None   Collection Time: 04/02/21  7:37 AM  Result Value Ref Range   HIV Screen 4th Generation wRfx Non Reactive Non Reactive    Comment: Performed at [facility], [place] [address], [name], [place] [address]  Lipid panel     Status: Abnormal   Collection Time: 04/02/21  7:39 AM  Result Value Ref Range   Cholesterol 232 (H) 0 - 200 mg/dL   Triglycerides 38 <150 mg/dL   HDL 52 >40 mg/dL   Total CHOL/HDL Ratio 4.5 RATIO   VLDL 8 0 - 40 mg/dL   LDL Cholesterol 172 (H) 0 - 99 mg/dL    Comment:        Total Cholesterol/HDL:CHD Risk Coronary Heart Disease Risk Table                     Men   Women  1/2 Average Risk   3.4   3.3  Average Risk       5.0   4.4  2 X Average Risk   9.6   7.1  3  X Average Risk  23.4   11.0        Use the calculated Patient Ratio above and the CHD Risk Table to determine the patient's CHD Risk.        ATP III CLASSIFICATION (LDL):  <100     mg/dL   Optimal  100-129  mg/dL   Near or Above                    Optimal  130-159  mg/dL   Borderline  160-189  mg/dL   High  >190     mg/dL   Very High Performed at [facility] [address], [place], [place] [address]      Lipid Panel     Component Value Date/Time   CHOL 232 (H) 04/02/2021 0739   CHOL 220 (H) 01/15/2019 1409   TRIG 38 04/02/2021 0739   HDL 52 04/02/2021 0739   HDL 46 01/15/2019 1409   CHOLHDL 4.5 04/02/2021 0739   VLDL 8 04/02/2021 0739   LDLCALC 172 (H) 04/02/2021 0739   LDLCALC 156 (H) 01/15/2019 1409     Lab Results  Component Value Date   HGBA1C 5.8 08/19/2020   HGBA1C 6.1 (H) 01/19/2016   HGBA1C 6 09/13/2014     Lab Results  Component Value Date   LDLCALC 172 (H) 04/02/2021   CREATININE 1.06 04/02/2021     HPI :Donald [name] is a 50 y.o. male with medical history significant of hypertension, hyperlipidemia, prediabetes, and RBBB presents with complaints of chest pain.  Patient reports that he had gone to bed around 10 PM and was awoken out of his sleep at around 1130.  Last night with left-sided chest pain that he describes as pressure with radiation to his back.  When he checked his blood pressure it was elevated up to 177/102.  Patient notes that he has been taking all of his medications as prescribed.  Denies having any fever, diaphoresis, nausea, vomiting, abdominal pain, swelling, calf pain, shortness of breath, or recent travel/prolonged immobilization.  Pain was rated as a 5 out of 10 on the pain scale.  He immediately called 911.  Patient reports that he has had some congestion with intermittent cough.  He smoked for very short period however 25 years ago.  Denies any alcohol or drug use.   He is followed by Dr. 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  Patient underwent Myoview stress testing 07/11/2019 which was noted to be normal study.     En route with EMS patient was given 324 mg aspirin and 1 sublingual nitroglycerin with relief.  Reports pain went down  to a 3 out of 10 on the pain scale.   ED Course: Upon admission into the emergency department patient was seen to be afebrile, pulse 56-79, respirations 9-22, and all other vital signs maintained.  Labs significant for potassium 3.3, D-dimer <0.27, and high-sensitivity troponin negative x2. Patient had been given morphine 4 mg IV and Zofran 4 mg IV.  TRH called to admit   HOSPITAL COURSE: Chest pain: Patient presents with complaints of chest pain waking him up out of sleep.  Initial high-sensitivity troponins negative x2.  EKG appears similar to previous with right bundle branch block.  Risk factors include HTN, HLD, and obesity.  Labs were significant for LDL elevated at 171.  Cardiology evaluated the patient recommending outpatient Coronary CT scan of the chest for which they will arrange.  Cardiology noted that it was safe to discharge patient from a cardiac standpoint. Echocardiogram order was discontinued.   -Continue aspirin and increased dose of atorvastatin   Hypokalemia: Acute.  Initial potassium 3.3.  Patient was give 40 mEq of potassium chloride p.o. suspect likely diuretic induced hypokalemia. -Will need to follow-up in the outpatient setting to have repeat check  Essential hypertension: On admission blood pressures have been stable, but patient reported prior to arrival blood pressures elevated up to 177/102.  Home blood pressure medication regimen includes amlodipine 10 mg daily, losartan 100 mg daily, and hydrochlorothiazide 25 mg daily. -Continue current home regimen as tolerated   Hyperlipidemia: Last LDL was 95 on 08/2020.  Home medications include atorvastatin 10 mg daily.  Lipid panel revealed total cholesterol 232, HDL 52, LDL 172, triglycerides 38.  Atorvastatin was increased to 40 mg daily.  Prescription sent to his pharmacy.   Prediabetes: Last hemoglobin A1c was 5.8 on 08/19/2020.  RBBB: Chronic.  EKG remained unchanged.   Obesity: BMI 35.38 kg/m:   Discharge Exam:   General: Middle-age male currently in no acute distress Cardiac:  Regular rate and rhythm without significant murmur appreciated  Blood pressure (!) 151/82, pulse 72, temperature 98.6 F (37 C), temperature source Oral, resp. rate 17, height 6\' 2"  (1.88 m), weight 125 kg, SpO2 96 %.     Discharge Instructions     Diet - low sodium heart healthy   Complete by: As directed    Discharge instructions   Complete by: As directed    Cardiology evaluated you today and felt from a heart standpoint it was safe to discharge you home.  They will schedule you with outpatient coronary CT to evaluate your chest pain.  They recommended that she increase atorvastatin to 40 mg daily as you are cholesterol levels were elevated.  All other medications were recommended to be continued as prescribed.   Increase activity slowly   Complete by: As directed         Follow-up Information     [name], MD. Schedule an appointment as soon as possible for a visit today.   Specialties: Family Medicine, Sports Medicine Why: Call and make an appointment to follow-up with primary care provider for ED visit within 1 to 2 weeks. Contact information: [address] [place] [address] [phone]         Josue Hector, MD .   Specialty: Cardiology Contact information: [phone] [place] [address] [phone]                 Signed: Norval Morton 04/02/2021, 3:16 PM        Time spent >45 mins

## 2021-04-02 NOTE — Consult Note (Signed)
Cardiology Consultation:   Patient ID: Donald [name] MRN: [id number]; DOB: 03/19/1971  Admit date: 04/02/2021 Date of Consult: 04/02/2021  PCP:  [name], MD   [facility] Providers Cardiologist:  Jenkins Rouge, MD        Patient Profile:   Donald [name] is a 50 y.o. male with a PMH of nonobstructive CAD, HTN, HLD, GERD, atypical chest pain, who is being seen 04/02/2021 for the evaluation of chest pain at the request of Dr. Tamala Julian.  History of Present Illness:   Mr. Lucken presented to the ED via EMS for evaluation of left-sided chest pain radiating to left arm with associated shortness of breath on the evening of 04/01/2021.  Chest pain woke him from sleep.  He took his blood pressure and noted it to be elevated prompting him to contact EMS.  He was given aspirin 324 mg and 1 SL nitro with improvement but not complete resolution of pain.  He was transported to the ED for further evaluation.  He was last evaluated by cardiology and an outpatient visit with Dr. Johnsie Cancel 10/2020.  He has a history of atypical chest pain for which he underwent a LHC in 2017 which revealed only 40% ramus disease.  His last ischemic evaluation was a NST 07/2019 which showed no evidence of ischemia and EF 65%.  Hospital course: BP initially elevated then improved and stable, intermittent mild bradycardia to the 50s, otherwise VSS.  Labs notable for K3.3, Cr 1.06, CBC WNL, LDL significantly elevated to 172, HsTrop 3 x2. EKG showed sinus rhythm, rate 70 bpm, chronic RBBB, no significant change from previous.  CXR showed no acute findings.  He was given morphine and Zofran in the ED with resolution of pain.  He was admitted to medicine and home medications continued.  Cardiology asked to evaluate. Past Medical History:  Diagnosis Date   Blood transfusion without reported diagnosis    DEPRESSION, MILD    Dysuria    G E R D    Hypercholesterolemia    Hyperlipidemia    NOT TAKING MEDICATION    Hypertension    Lumbago    NEPHROLITHIASIS, HX OF    PARASOMNIA    Right bundle branch block     Past Surgical History:  Procedure Laterality Date   CARDIAC CATHETERIZATION N/A 01/19/2016   Procedure: Left Heart Cath and Coronary Angiography;  Surgeon: Sherren Mocha, MD;  Location: [facility];  Service: Cardiovascular;  Laterality: N/A;   HAND SURGERY     right and left hand       Home Medications:  Prior to Admission medications   Medication Sig Start Date End Date Taking? Authorizing Provider  amLODipine (NORVASC) 10 MG tablet Take 1 tablet (10 mg total) by mouth daily. 08/25/20  Yes Janith Lima, MD  aspirin 325 MG tablet Take 162.5 mg by mouth at bedtime.   Yes [provider]  atorvastatin (LIPITOR) 10 MG tablet Take 1 tablet (10 mg total) by mouth daily. Patient taking differently: Take 10 mg by mouth at bedtime. 08/25/20  Yes Janith Lima, MD  Cholecalciferol (VITAMIN D3) 50 MCG (2000 UT) TABS Take 2,000 Units by mouth daily.   Yes [provider]  fluticasone (FLONASE) 50 MCG/ACT nasal spray USE 1 SPRAY IN BOTH NOSTRILS DAILY Patient taking differently: Place 1 spray into both nostrils daily as needed for allergies. 03/27/20  Yes [name], MD  hydrochlorothiazide (HYDRODIURIL) 25 MG tablet Take 25 mg by mouth daily. 01/05/21  Yes [provider]  losartan (COZAAR) 100 MG tablet TAKE 1 TABLET EVERY DAY Patient taking differently: Take 100 mg by mouth daily. 10/01/20  Yes Janith Lima, MD  omeprazole (PRILOSEC) 40 MG capsule Take 40 mg bid for 6 weeks to promote mucosal healing,then reduce to 40 mg daily and titrate to lowest effective dose,or off completely if GI Symptoms resolve. Patient taking differently: Take 40 mg by mouth daily as needed (heartburn). 01/24/21  Yes Cirigliano, Vito V, DO  oxyCODONE-acetaminophen (PERCOCET) 5-325 MG tablet Take 1 tablet by mouth every 4 (four) hours as needed. Patient not taking: No sig reported  09/02/20   Larene Pickett, PA-C    Inpatient Medications: Scheduled Meds:  amLODipine  10 mg Oral Daily   aspirin  162.5 mg Oral QHS   atorvastatin  40 mg Oral Daily   enoxaparin (LOVENOX) injection  60 mg Subcutaneous Q24H   hydrochlorothiazide  25 mg Oral Daily   losartan  100 mg Oral Daily   Continuous Infusions:  PRN Meds: acetaminophen, nitroGLYCERIN, ondansetron (ZOFRAN) IV  Allergies:    Allergies  Allergen Reactions   Ace Inhibitors Cough    Lisinopril caused cough    Social History:   Social History   Socioeconomic History   Marital status: Married    Spouse name: Not on file   Number of children: 6   Years of education: 15   Highest education level: Not on file  Occupational History   Occupation: Glass blower/designer    Employer: [employer]  Tobacco Use   Smoking status: Former    Types: Cigarettes   Smokeless tobacco: Never  Substance and Sexual Activity   Alcohol use: No    Alcohol/week: 0.0 standard drinks   Drug use: No   Sexual activity: Not on file  Other Topics Concern   Not on file  Social History Narrative   ** Merged History Encounter **       Social Determinants of Health   Financial Resource Strain: Not on file  Food Insecurity: Not on file  Transportation Needs: Not on file  Physical Activity: Not on file  Stress: Not on file  Social Connections: Not on file  Intimate Partner Violence: Not on file    Family History:    Family History  Problem Relation Age of Onset   Hypertension Mother    Hypertension Sister      ROS:  Please see the history of present illness.  No fevers, chills, productive cough or hemoptysis. All other ROS reviewed and negative.     Physical Exam/Data:   Vitals:   04/02/21 0930 04/02/21 1145 04/02/21 1245 04/02/21 1323  BP: 131/73 118/77 136/90 (!) 151/82  Pulse: 72 63 72   Resp: (!) 9 19 17    Temp:      TempSrc:      SpO2: 99% 97% 96%   Weight:      Height:       No intake or output  data in the 24 hours ending 04/02/21 1350 Last 3 Weights 04/02/2021 01/24/2021 01/04/2021  Weight (lbs) 275 lb 9.2 oz 150 lb 150 lb  Weight (kg) 125 kg 68.04 kg 68.04 kg     Body mass index is 35.38 kg/m.  Well-developed well-nourished in no acute distress.  Skin is warm and dry. HEENT is normal with normal eyelids. Neck is supple with normal carotid upstroke and no bruits. Chest is clear to auscultation with normal expansion. Cardiovascular exam reveals a regular  rate and rhythm with normal S1 and S2.  No murmurs rubs or gallops noted. Abdominal exam nontender or distended positive bowel sounds no hepatosplenomegaly no masses appreciated. 2+ femoral pulses bilaterally with no bruits. Extremities show no edema.  2+ dorsalis pedis pulses. Neurological exam cranial nerves II through XII are intact and no focal findings are noted. Psych exam-answers questions appropriately.  EKG:  The EKG was personally reviewed and demonstrates:  sinus rhythm, rate 70 bpm, chronic RBBB, no significant change from previous.  Relevant CV Studies: NST 07/2019: Nuclear stress EF: 65%. There was no ST segment deviation noted during stress. The study is normal. The left ventricular ejection fraction is normal (55-65%).   Normal stress nuclear study with no ischemia or infarction.  Gated ejection fraction 65% with normal wall motion.  LHC 2017: Ost Ramus lesion, 40 %stenosed. There is hyperdynamic left ventricular systolic function. LV end diastolic pressure is normal. The left ventricular ejection fraction is greater than 65% by visual estimate.   1. Mild nonobstructive CAD 2. Hyperdynamic LV function   Suspect noncardiac cause of chest pain Laboratory Data:  High Sensitivity Troponin:   Recent Labs  Lab 04/02/21 0054 04/02/21 0254  TROPONINIHS 3 3     Chemistry Recent Labs  Lab 04/02/21 0054  NA 137  K 3.3*  CL 103  CO2 25  GLUCOSE 110*  BUN 14  CREATININE 1.06  CALCIUM 9.3  GFRNONAA  >60  ANIONGAP 9    Recent Labs  Lab 04/02/21 0254  PROT 7.1  ALBUMIN 3.9  AST 22  ALT 21  ALKPHOS 42  BILITOT 0.9   Lipids  Recent Labs  Lab 04/02/21 0739  CHOL 232*  TRIG 38  HDL 52  LDLCALC 172*  CHOLHDL 4.5    Hematology Recent Labs  Lab 04/02/21 0054  WBC 5.8  RBC 4.76  HGB 13.2  HCT 40.3  MCV 84.7  MCH 27.7  MCHC 32.8  RDW 14.1  PLT 302    DDimer  Recent Labs  Lab 04/02/21 0440  DDIMER <0.27     Radiology/Studies:  DG Chest 2 View  Result Date: 04/02/2021 CLINICAL DATA:  Chest pain EXAM: CHEST - 2 VIEW COMPARISON:  None. FINDINGS: Lungs are clear.  No pleural effusion or pneumothorax. The heart is normal in size. Visualized osseous structures are within normal limits. IMPRESSION: Normal chest radiographs. Electronically Signed   By: Julian Hy M.D.   On: 04/02/2021 01:49     Assessment and Plan:   1. Chest pain in patient with nonobstructive CAD: Patient presented with chest pain that awoke him from sleep late last night. See MD assessment below.   Risk Assessment/Risk Scores:     :[id number]}   HEAR Score (for undifferentiated chest pain):  HEAR Score: 5{   For questions or updates, please contact [place] Please consult [email or web address] for contact info under    Signed, Kirk Ruths, MD  04/02/2021 1:50 PM As above, patient seen and examined.  Briefly he is a 50 year old male with past medical history of hypertension, hyperlipidemia, gastroesophageal reflux disease, chest pain for evaluation of chest pain.  Patient had a heart catheterization in 2017 revealing normal coronaries other than a 40% ramus.  A nuclear study in February 2021 showed no ischemia.  He states he typically has dyspnea with more vigorous activities but not routine activities.  No orthopnea, PND, pedal edema, exertional chest pain or syncope.  He developed pressure in the left chest area radiating to his back  at 1130 last night.  No associated symptoms.  Not  pleuritic, positional or related to food.  The pain persisted but was improved with nitroglycerin by EMS.  He had more severe pain for approximately 3 hours but has had mild pain since.  Cardiology now asked to evaluate. Physical exam unremarkable including 2+ carotid, radial, femoral and dorsalis pedis pulses bilaterally.  Liver functions normal.  Troponin 3 and 3.  Total cholesterol 232 with LDL 172.  Hemoglobin 13.2.  D-dimer is less than 0.27.  Electrocardiogram shows sinus rhythm with right bundle branch block unchanged.  1 chest pain-symptoms are somewhat atypical.  Previous cardiac evaluation unrevealing.  His enzymes are negative and his electrocardiogram shows no new ST changes.  Patient can be discharged from a cardiac standpoint.  We will arrange outpatient cardiac CTA to rule out obstructive coronary disease though I think this is unlikely.  2 hyperlipidemia-LDL significantly elevated.  Increase Lipitor to 40 mg daily.  Check lipids and liver in 8 weeks.  3 hypertension-patient's blood pressure is controlled.  Continue present medications as an outpatient.  We will sign off.  Please call with questions. Kirk Ruths

## 2021-04-02 NOTE — ED Notes (Signed)
Reviewed discharge instructions with patient. Follow-up care and medications reviewed. Patient  verbalized understanding. Patient A&Ox4, VSS, and ambulatory with steady gait upon discharge.  °

## 2021-04-02 NOTE — ED Triage Notes (Signed)
Patient arrived with EMS from home reports left chest pain radiating to left arm with SOB this evening , he received ASA 324 mg and 1 NTG sl with relief , no emesis or diaphoresis , his cardiologist is Dr. Johnsie Cancel .

## 2021-04-02 NOTE — ED Notes (Signed)
Niece Tami Lin [phone] would like an update (may need an interpreter)

## 2021-04-02 NOTE — ED Provider Notes (Signed)
[facility] EMERGENCY DEPARTMENT Provider Note   CSN: [id number] Arrival date & time: 04/02/21  8756     History Chief Complaint  Patient presents with   Chest Pain    Donald [name] is a 50 y.o. male with a hx of hypertension, hyperlipidemia, RBBB, prediabetes, and GERD who presents to the ED with complaints of chest pain that began shortly PTA. Patient reports he woke from sleep with pain described as heaviness/pressure to the left chest with radiation to the left shoulder/back area. Constant. Took BP and it was elevated, called EMS. Given 324 mg of ASA and 1 SL NTG tablet with improvement, pain currently 2/10 in severity. No other alleviating/aggravating factors. Yesterday was a normal day for him, no injuries, heavy lifting, or abnormal diet. Denies dyspnea, N/V, diaphoresis, lightheadedness, syncope, dizziness, or leg pain/swelling.   Discussed translator- patient declined.   HPI     Past Medical History:  Diagnosis Date   Blood transfusion without reported diagnosis    DEPRESSION, MILD    Dysuria    G E R D    Hypercholesterolemia    Hyperlipidemia    NOT TAKING MEDICATION   Hypertension    Lumbago    NEPHROLITHIASIS, HX OF    PARASOMNIA    Right bundle branch block     Patient Active Problem List   Diagnosis Date Noted   Dyslipidemia, goal LDL below 130 08/25/2020   Encounter for general adult medical examination with abnormal findings 08/19/2020   Prediabetes 08/19/2020   Screen for colon cancer 08/19/2020   Diuretic-induced hypokalemia 08/19/2020   Chronic bilateral low back pain without sciatica 08/19/2020   Right bundle branch block 03/11/2010   Essential hypertension 05/14/2009   PARASOMNIA 05/14/2009   G E R D 05/13/2009    Past Surgical History:  Procedure Laterality Date   CARDIAC CATHETERIZATION N/A 01/19/2016   Procedure: Left Heart Cath and Coronary Angiography;  Surgeon: Sherren Mocha, MD;  Location: [facility];   Service: Cardiovascular;  Laterality: N/A;   HAND SURGERY     right and left hand         Family History  Problem Relation Age of Onset   Hypertension Mother    Hypertension Sister     Social History   Tobacco Use   Smoking status: Never   Smokeless tobacco: Never  Substance Use Topics   Alcohol use: No    Alcohol/week: 0.0 standard drinks   Drug use: No    Home Medications Prior to Admission medications   Medication Sig Start Date End Date Taking? Authorizing Provider  amLODipine (NORVASC) 10 MG tablet Take 1 tablet (10 mg total) by mouth daily. 08/25/20   Janith Lima, MD  aspirin 325 MG tablet Take 162.5 mg by mouth daily.    [provider]  atorvastatin (LIPITOR) 10 MG tablet Take 1 tablet (10 mg total) by mouth daily. 08/25/20   Janith Lima, MD  fluticasone Asencion Islam) 50 MCG/ACT nasal spray USE 1 SPRAY IN BOTH NOSTRILS DAILY Patient not taking: Reported on 01/24/2021 03/27/20   [name], MD  hydrochlorothiazide (HYDRODIURIL) 25 MG tablet Take 25 mg by mouth daily. 01/05/21   [provider]  losartan (COZAAR) 100 MG tablet TAKE 1 TABLET EVERY DAY 10/01/20   Janith Lima, MD  omeprazole (PRILOSEC) 40 MG capsule Take 40 mg bid for 6 weeks to promote mucosal healing,then reduce to 40 mg daily and titrate to lowest effective dose,or off  completely if GI Symptoms resolve. 01/24/21   Cirigliano, Vito V, DO  oxyCODONE-acetaminophen (PERCOCET) 5-325 MG tablet Take 1 tablet by mouth every 4 (four) hours as needed. Patient not taking: Reported on 01/24/2021 09/02/20   Larene Pickett, PA-C    Allergies    Ace inhibitors  Review of Systems   Review of Systems  Constitutional:  Negative for chills and fever.  Respiratory:  Negative for cough and shortness of breath.   Cardiovascular:  Positive for chest pain. Negative for leg swelling.  Gastrointestinal:  Negative for abdominal pain, nausea and vomiting.  Neurological:  Negative for dizziness,  syncope and light-headedness.  All other systems reviewed and are negative.  Physical Exam Updated Vital Signs BP 130/89   Pulse 69   Temp 98.6 F (37 C) (Oral)   Resp 10   Ht 6\' 2"  (1.88 m)   Wt 125 kg   SpO2 100%   BMI 35.38 kg/m   Physical Exam Vitals and nursing note reviewed.  Constitutional:      General: He is not in acute distress.    Appearance: He is well-developed. He is not toxic-appearing.  HENT:     Head: Normocephalic and atraumatic.  Eyes:     General:        Right eye: No discharge.        Left eye: No discharge.     Conjunctiva/sclera: Conjunctivae normal.  Cardiovascular:     Rate and Rhythm: Normal rate and regular rhythm.     Pulses:          Radial pulses are 2+ on the right side and 2+ on the left side.  Pulmonary:     Effort: Pulmonary effort is normal. No respiratory distress.     Breath sounds: Normal breath sounds. No wheezing, rhonchi or rales.  Chest:     Chest wall: No tenderness.  Abdominal:     General: There is no distension.     Palpations: Abdomen is soft.     Tenderness: There is no abdominal tenderness. There is no guarding or rebound.  Musculoskeletal:     Cervical back: Neck supple.     Right lower leg: No tenderness. No edema.     Left lower leg: No tenderness. No edema.  Skin:    General: Skin is warm and dry.     Findings: No rash.  Neurological:     Mental Status: He is alert.     Comments: Clear speech.   Psychiatric:        Behavior: Behavior normal.    ED Results / Procedures / Treatments   Labs (all labs ordered are listed, but only abnormal results are displayed) Labs Reviewed  BASIC METABOLIC PANEL - Abnormal; Notable for the following components:      Result Value   Potassium 3.3 (*)    Glucose, Bld 110 (*)    All other components within normal limits  CBC  PROTIME-INR  TROPONIN I (HIGH SENSITIVITY)  TROPONIN I (HIGH SENSITIVITY)    EKG EKG Interpretation  Date/Time:  Saturday April 02 2021  00:38:09 EDT Ventricular Rate:  70 PR Interval:  172 QRS Duration: 170 QT Interval:  446 QTC Calculation: 481 R Axis:   74 Text Interpretation: Normal sinus rhythm Right bundle branch block Abnormal ECG No significant change was found Confirmed by Ezequiel Essex [phone]) on 04/02/2021 2:02:07 AM  Radiology DG Chest 2 View  Result Date: 04/02/2021 CLINICAL DATA:  Chest pain EXAM: CHEST - 2  VIEW COMPARISON:  None. FINDINGS: Lungs are clear.  No pleural effusion or pneumothorax. The heart is normal in size. Visualized osseous structures are within normal limits. IMPRESSION: Normal chest radiographs. Electronically Signed   By: Julian Hy M.D.   On: 04/02/2021 01:49    Procedures Procedures   Medications Ordered in ED Medications  nitroGLYCERIN (NITROSTAT) SL tablet 0.4 mg (has no administration in time range)    ED Course  I have reviewed the triage vital signs and the nursing notes.  Pertinent labs & imaging results that were available during my care of the patient were reviewed by me and considered in my medical decision making (see chart for details).    MDM Rules/Calculators/A&P                           Patient presents to the emergency department with chest pain. Patient nontoxic appearing, in no apparent distress, vitals without significant abnormality. Fairly benign exam.   Prior heart catheterization reviewed: 2017:  Ost Ramus lesion, 40 %stenosed. There is hyperdynamic left ventricular systolic function. LV end diastolic pressure is normal. The left ventricular ejection fraction is greater than 65% by visual estimate.  1. Mild nonobstructive CAD 2. Hyperdynamic LV function  Patient's primary cardiologist: Dr. Johnsie Cancel  DDX: ACS, pulmonary embolism, dissection, pneumothorax, pneumonia, arrhythmia, severe anemia, MSK, GERD, anxiety, abdominal process.   Additional history obtained:  Additional history obtained from chart review & nursing note review.    EKG: Normal sinus rhythm Right bundle branch block Abnormal ECG No significant change was found  Lab Tests:  I Ordered, reviewed, and interpreted labs, which included:  CBC: unremarkable.  BMP: mild hypokalemia  Hepatic function panel: unremarkable.  Lipase: WNL Troponin: WNL, flat D-dimer ordered per discussion w/ attending: WNL  Imaging Studies ordered:  I ordered imaging studies which included CXR, I independently reviewed, formal radiology impression shows: Normal chest radiographs  ED Course:  Heart Pathway Score 6- EKG with RBBB- similar to prior, reassuring troponins, however patient's history with concerning factors discussed w/ attending who has evaluated the patient- plan for admission. Discussed with patient who is in agreement. Consult placed to hospitalist service.   Patient care signed out to [place] @ change of shift pending consult.   Portions of this note were generated with [employer]. Dictation errors may occur despite best attempts at proofreading.    Final Clinical Impression(s) / ED Diagnoses Final diagnoses:  None    Rx / DC Orders ED Discharge Orders     None        Amaryllis Dyke, PA-C 04/02/21 2947    Ezequiel Essex, MD 04/02/21 [id number]

## 2021-04-02 NOTE — H&P (Addendum)
History and Physical    Donald [name] YYT:[id number] DOB: [birth year] DOA: 04/02/2021  Referring MD/NP/PA: Marcene Brawn, PA-C PCP: [name], MD  Consultants: Jenkins Rouge, MD-cardiology Patient coming from: Home via EMS  Chief Complaint: Chest pain  I have personally briefly reviewed patient's old medical records in [place]   HPI: Donald [name] is a 50 y.o. male with medical history significant of hypertension, hyperlipidemia, prediabetes, and RBBB presents with complaints of chest pain.  Patient reports that he had gone to bed around 10 PM and was awoken out of his sleep at around 1130.  Last night with left-sided chest pain that he describes as pressure with radiation to his back.  When he checked his blood pressure it was elevated up to 177/102.  Patient notes that he has been taking all of his medications as prescribed.  Denies having any fever, diaphoresis, nausea, vomiting, abdominal pain, swelling, calf pain, shortness of breath, or recent travel/prolonged immobilization.  Pain was rated as a 5 out of 10 on the pain scale.  He immediately called 911.  Patient reports that he has had some congestion with intermittent cough.  He smoked for very short period however 25 years ago.  Denies any alcohol or drug use.  He is followed by Dr. 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  Patient underwent Myoview stress testing 07/11/2019 which was noted to be normal study.   En route with EMS patient was given 324 mg aspirin and 1 sublingual nitroglycerin with relief.  Reports pain went down to a 3 out of 10 on the pain scale.  ED Course: Upon admission into the emergency department patient was seen to be afebrile, pulse 56-79, respirations 9-22, and all other vital signs maintained.  Labs significant for potassium 3.3, D-dimer <0.27,  and high-sensitivity troponin negative x2. Patient had been given morphine 4 mg IV and Zofran 4 mg IV.  TRH called to admit  Review of Systems  Constitutional:  Negative for chills, fever and malaise/fatigue.  HENT:  Positive for congestion.   Eyes:  Negative for double vision and pain.  Respiratory:  Positive for cough. Negative for shortness of breath.   Cardiovascular:  Positive for chest pain. Negative for leg swelling.  Gastrointestinal:  Negative for abdominal pain, nausea and vomiting.  Genitourinary:  Negative for dysuria and hematuria.  Musculoskeletal:  Negative for joint pain and myalgias.  Skin:  Negative for rash.  Neurological:  Negative for focal weakness and loss of consciousness.  Psychiatric/Behavioral:  Negative for substance abuse.    Past Medical History:  Diagnosis Date   Blood transfusion without reported diagnosis    DEPRESSION, MILD    Dysuria    G E R D    Hypercholesterolemia    Hyperlipidemia    NOT TAKING MEDICATION   Hypertension    Lumbago    NEPHROLITHIASIS, HX OF    PARASOMNIA    Right bundle branch block     Past Surgical History:  Procedure Laterality Date   CARDIAC CATHETERIZATION N/A 01/19/2016   Procedure: Left Heart Cath and Coronary Angiography;  Surgeon: Sherren Mocha, MD;  Location: [facility];  Service: Cardiovascular;  Laterality: N/A;   HAND SURGERY     right and left hand       reports that he has never smoked. He has never used smokeless tobacco.  He reports that he does not drink alcohol and does not use drugs.  Allergies  Allergen Reactions   Ace Inhibitors Cough    Lisinopril caused cough    Family History  Problem Relation Age of Onset   Hypertension Mother    Hypertension Sister     Prior to Admission medications   Medication Sig Start Date End Date Taking? Authorizing Provider  amLODipine (NORVASC) 10 MG tablet Take 1 tablet (10 mg total) by mouth daily. 08/25/20  Yes Janith Lima, MD  aspirin 325 MG  tablet Take 162.5 mg by mouth at bedtime.   Yes [provider]  atorvastatin (LIPITOR) 10 MG tablet Take 1 tablet (10 mg total) by mouth daily. Patient taking differently: Take 10 mg by mouth at bedtime. 08/25/20  Yes Janith Lima, MD  Cholecalciferol (VITAMIN D3) 50 MCG (2000 UT) TABS Take 2,000 Units by mouth daily.   Yes [provider]  fluticasone (FLONASE) 50 MCG/ACT nasal spray USE 1 SPRAY IN BOTH NOSTRILS DAILY Patient taking differently: Place 1 spray into both nostrils daily as needed for allergies. 03/27/20  Yes [name], MD  hydrochlorothiazide (HYDRODIURIL) 25 MG tablet Take 25 mg by mouth daily. 01/05/21  Yes [provider]  losartan (COZAAR) 100 MG tablet TAKE 1 TABLET EVERY DAY Patient taking differently: Take 100 mg by mouth daily. 10/01/20  Yes Janith Lima, MD  omeprazole (PRILOSEC) 40 MG capsule Take 40 mg bid for 6 weeks to promote mucosal healing,then reduce to 40 mg daily and titrate to lowest effective dose,or off completely if GI Symptoms resolve. Patient taking differently: Take 40 mg by mouth daily as needed (heartburn). 01/24/21  Yes Cirigliano, Vito V, DO  oxyCODONE-acetaminophen (PERCOCET) 5-325 MG tablet Take 1 tablet by mouth every 4 (four) hours as needed. Patient not taking: No sig reported 09/02/20   Larene Pickett, PA-C    Physical Exam:  Constitutional: Middle-age male who appears to be in no acute distress at this Vitals:   04/02/21 0630 04/02/21 0700 04/02/21 0715 04/02/21 0730  BP: 116/84 124/79 129/83 118/76  Pulse: 75 70 73 71  Resp: (!) 22  12 13   Temp:      TempSrc:      SpO2: 97% 99% 98% 97%  Weight:      Height:       Eyes: PERRL, lids and conjunctivae normal ENMT: Mucous membranes are moist. Posterior pharynx clear of any exudate or lesions.  Neck: normal, supple, no masses, no thyromegaly.   Respiratory: clear to auscultation bilaterally, no wheezing, no crackles. Normal respiratory effort. No  accessory muscle use.  Cardiovascular: Regular rate and rhythm, no murmurs / rubs / gallops. No significant lower extremity edema. 2+ pedal pulses.  Abdomen: no tenderness, no masses palpated. No hepatosplenomegaly. Bowel sounds positive.  Musculoskeletal: no clubbing / cyanosis. No joint deformity upper and lower extremities. Good ROM, no contractures. Normal muscle tone.  Skin: no rashes, lesions, ulcers. No induration Neurologic: CN 2-12 grossly intact. Sensation intact, DTR normal. Strength 5/5 in all 4.  Psychiatric: Normal judgment and insight. Alert and oriented x 3. Normal mood.     Labs on Admission: I have personally reviewed following labs and imaging studies  CBC: Recent Labs  Lab 04/02/21 0054  WBC 5.8  HGB 13.2  HCT 40.3  MCV 84.7  PLT 597   Basic Metabolic Panel: Recent Labs  Lab 04/02/21 0054  NA 137  K 3.3*  CL 103  CO2  25  GLUCOSE 110*  BUN 14  CREATININE 1.06  CALCIUM 9.3   GFR: Estimated Creatinine Clearance: 114.5 mL/min (by C-G formula based on SCr of 1.06 mg/dL). Liver Function Tests: Recent Labs  Lab 04/02/21 0254  AST 22  ALT 21  ALKPHOS 42  BILITOT 0.9  PROT 7.1  ALBUMIN 3.9   Recent Labs  Lab 04/02/21 0254  LIPASE 31   No results for input(s): AMMONIA in the last 168 hours. Coagulation Profile: Recent Labs  Lab 04/02/21 0054  INR 1.0   Cardiac Enzymes: No results for input(s): CKTOTAL, CKMB, CKMBINDEX, TROPONINI in the last 168 hours. BNP (last 3 results) No results for input(s): PROBNP in the last 8760 hours. HbA1C: No results for input(s): HGBA1C in the last 72 hours. CBG: No results for input(s): GLUCAP in the last 168 hours. Lipid Profile: No results for input(s): CHOL, HDL, LDLCALC, TRIG, CHOLHDL, LDLDIRECT in the last 72 hours. Thyroid Function Tests: No results for input(s): TSH, T4TOTAL, FREET4, T3FREE, THYROIDAB in the last 72 hours. Anemia Panel: No results for input(s): VITAMINB12, FOLATE, FERRITIN, TIBC,  IRON, RETICCTPCT in the last 72 hours. Urine analysis:    Component Value Date/Time   COLORURINE YELLOW 09/01/2020 2100   APPEARANCEUR CLEAR 09/01/2020 2100   LABSPEC 1.016 09/01/2020 2100   PHURINE 6.0 09/01/2020 2100   GLUCOSEU NEGATIVE 09/01/2020 2100   GLUCOSEU NEGATIVE 08/19/2020 1610   HGBUR NEGATIVE 09/01/2020 2100   HGBUR negative 03/09/2010 0000   BILIRUBINUR NEGATIVE 09/01/2020 2100   BILIRUBINUR negative 01/13/2020 1501   BILIRUBINUR negative 09/13/2014 1459   KETONESUR NEGATIVE 09/01/2020 2100   PROTEINUR NEGATIVE 09/01/2020 2100   UROBILINOGEN 0.2 08/19/2020 1610   NITRITE NEGATIVE 09/01/2020 2100   LEUKOCYTESUR NEGATIVE 09/01/2020 2100   Sepsis Labs: No results found for this or any previous visit (from the past 240 hour(s)).   Radiological Exams on Admission: DG Chest 2 View  Result Date: 04/02/2021 CLINICAL DATA:  Chest pain EXAM: CHEST - 2 VIEW COMPARISON:  None. FINDINGS: Lungs are clear.  No pleural effusion or pneumothorax. The heart is normal in size. Visualized osseous structures are within normal limits. IMPRESSION: Normal chest radiographs. Electronically Signed   By: Julian Hy M.D.   On: 04/02/2021 01:49    EKG: Independently reviewed.  Normal sinus rhythm at 70 bpm with RBBB unchanged from previous  Assessment/Plan Chest pain: Patient presents with complaints of chest pain waking him up out of sleep.  Initial high-sensitivity troponins negative x2.  EKG appears similar to previous with right bundle branch block.  Risk factors include HTN, HLD, and obesity. -Admit to a cardiac telemetry bed -Nitroglycerin as needed for chest pain -Check lipid panel -Check echocardiogram -Continue aspirin and statin -Appreciate cardiology consultative services follow-up for any further recommendations.  Hypokalemia: Acute.  Initial potassium 3.3. -Give 40 mEq of potassium chloride p.o. -Continue to monitor and replace as needed  Essential hypertension: On  admission blood pressures have been stable, but patient reported prior to arrival blood pressures elevated up to 177/102.  Home blood pressure medication regimen includes amlodipine 10 mg daily, losartan 100 mg daily, and hydrochlorothiazide 25 mg daily. -Continue current home regimen as tolerated  Hyperlipidemia: Last LDL was 95 on 08/2020.  Home medications include atorvastatin 10 mg daily. -Check lipid panel(LDL was 172) -Increase atorvastatin to 40 mg nightly  Prediabetes: Last hemoglobin A1c was 5.8 08/19/2020. -Check hemoglobin A1c  RBBB: Chronic.  Obesity: BMI 35.38 kg/m:  DVT prophylaxis: Lovenox Code Status: Full Family  Communication: Wife present at bedside Disposition Plan: Hopefully discharge home once work-up complete. Consults called: Cardiology Admission status: observation  Norval Morton MD [facility]   If 7PM-7AM, please contact night-coverage   04/02/2021, 7:32 AM

## 2021-04-02 NOTE — ED Provider Notes (Signed)
Pt's care assumed at [facility] has been paged for admission for chest pain rule out.   I spoke with Hospitalist who will admit    Donald [name] 04/02/21 1561    Donald Essex, MD 04/02/21 [phone]

## 2021-04-03 ENCOUNTER — Other Ambulatory Visit: Payer: Self-pay | Admitting: Internal Medicine

## 2021-04-03 ENCOUNTER — Other Ambulatory Visit: Payer: Self-pay | Admitting: Medical

## 2021-04-03 DIAGNOSIS — R072 Precordial pain: Secondary | ICD-10-CM

## 2021-04-03 DIAGNOSIS — I10 Essential (primary) hypertension: Secondary | ICD-10-CM

## 2021-04-03 NOTE — Progress Notes (Signed)
   See consult noted 04/02/21 for details.   Abigail Butts, PA-C 04/03/21; 10:35 AM

## 2021-04-08 ENCOUNTER — Other Ambulatory Visit: Payer: Self-pay | Admitting: Internal Medicine

## 2021-04-08 ENCOUNTER — Other Ambulatory Visit: Payer: Self-pay | Admitting: Family Medicine

## 2021-04-08 DIAGNOSIS — I10 Essential (primary) hypertension: Secondary | ICD-10-CM

## 2021-04-10 ENCOUNTER — Other Ambulatory Visit: Payer: Self-pay | Admitting: Family Medicine

## 2021-04-10 DIAGNOSIS — I10 Essential (primary) hypertension: Secondary | ICD-10-CM

## 2021-04-11 ENCOUNTER — Other Ambulatory Visit (HOSPITAL_COMMUNITY): Payer: Self-pay

## 2021-04-11 MED ORDER — VITAMIN D3 50 MCG (2000 UT) PO TABS
2000.0000 [IU] | ORAL_TABLET | Freq: Every day | ORAL | 0 refills | Status: DC
  Filled 2021-04-11: qty 30, 30d supply, fill #0

## 2021-04-11 MED ORDER — HYDROCHLOROTHIAZIDE 25 MG PO TABS
25.0000 mg | ORAL_TABLET | Freq: Every day | ORAL | 0 refills | Status: DC
  Filled 2021-04-11: qty 30, 30d supply, fill #0

## 2021-04-12 ENCOUNTER — Other Ambulatory Visit: Payer: Self-pay | Admitting: Internal Medicine

## 2021-04-15 ENCOUNTER — Other Ambulatory Visit (HOSPITAL_COMMUNITY): Payer: Self-pay

## 2021-04-15 ENCOUNTER — Other Ambulatory Visit: Payer: 59

## 2021-04-15 DIAGNOSIS — B9681 Helicobacter pylori [H. pylori] as the cause of diseases classified elsewhere: Secondary | ICD-10-CM

## 2021-04-17 LAB — H. PYLORI ANTIGEN, STOOL: H pylori Ag, Stl: NEGATIVE

## 2021-04-25 ENCOUNTER — Ambulatory Visit: Payer: 59 | Admitting: Family Medicine

## 2021-04-25 VITALS — BP 134/78 | HR 64 | Temp 98.4°F | Resp 16 | Ht 74.0 in | Wt 245.2 lb

## 2021-04-25 DIAGNOSIS — Z23 Encounter for immunization: Secondary | ICD-10-CM

## 2021-04-25 DIAGNOSIS — E785 Hyperlipidemia, unspecified: Secondary | ICD-10-CM

## 2021-04-25 DIAGNOSIS — R7303 Prediabetes: Secondary | ICD-10-CM | POA: Diagnosis not present

## 2021-04-25 DIAGNOSIS — E559 Vitamin D deficiency, unspecified: Secondary | ICD-10-CM | POA: Diagnosis not present

## 2021-04-25 DIAGNOSIS — I10 Essential (primary) hypertension: Secondary | ICD-10-CM | POA: Diagnosis not present

## 2021-04-25 LAB — COMPREHENSIVE METABOLIC PANEL
ALT: 15 U/L (ref 0–53)
AST: 16 U/L (ref 0–37)
Albumin: 4.5 g/dL (ref 3.5–5.2)
Alkaline Phosphatase: 49 U/L (ref 39–117)
BUN: 14 mg/dL (ref 6–23)
CO2: 29 mEq/L (ref 19–32)
Calcium: 9.7 mg/dL (ref 8.4–10.5)
Chloride: 102 mEq/L (ref 96–112)
Creatinine, Ser: 0.91 mg/dL (ref 0.40–1.50)
GFR: 96.69 mL/min (ref >60.00)
Glucose, Bld: 83 mg/dL (ref 70–99)
Potassium: 4 mEq/L (ref 3.5–5.1)
Sodium: 138 mEq/L (ref 135–145)
Total Bilirubin: 0.5 mg/dL (ref 0.2–1.2)
Total Protein: 7.4 g/dL (ref 6.0–8.3)

## 2021-04-25 LAB — LIPID PANEL
Cholesterol: 145 mg/dL (ref 0–200)
HDL: 50 mg/dL (ref >39.00)
LDL Cholesterol: 81 mg/dL (ref 0–99)
NonHDL: 94.81
Total CHOL/HDL Ratio: 3
Triglycerides: 68 mg/dL (ref 0.0–149.0)
VLDL: 13.6 mg/dL (ref 0.0–40.0)

## 2021-04-25 LAB — HEMOGLOBIN A1C: Hgb A1c MFr Bld: 6 % (ref 4.6–6.5)

## 2021-04-25 LAB — VITAMIN D 25 HYDROXY (VIT D DEFICIENCY, FRACTURES): VITD: 44.88 ng/mL (ref 30.00–100.00)

## 2021-04-25 MED ORDER — ATORVASTATIN CALCIUM 40 MG PO TABS: 40.0000 mg | ORAL_TABLET | Freq: Every day | ORAL | 1 refills | Status: DC

## 2021-04-25 MED ORDER — VITAMIN D3 50 MCG (2000 UT) PO TABS: 2000.0000 [IU] | ORAL_TABLET | Freq: Every day | ORAL | 1 refills | Status: DC

## 2021-04-25 MED ORDER — HYDROCHLOROTHIAZIDE 25 MG PO TABS: 25.0000 mg | ORAL_TABLET | Freq: Every day | ORAL | 1 refills | Status: DC

## 2021-04-25 NOTE — Patient Instructions (Signed)
No med changes today. Keep up the good work with diet and activity, exercise. Take care.

## 2021-04-25 NOTE — Progress Notes (Signed)
Subjective:  Patient ID: Donald [name], male    DOB: 1970/07/20  Age: 50 y.o. MRN: [id number]  CC:  Chief Complaint  Patient presents with   vitamin D    Pt due for recheck today    Hypertension    Pt reports used to have combo losartan HCTZ but hasnt had combo in some time and had run out of HCTZ before visit pt would like combo pll back, denies physicla sxs     HPI Donald [name] presents for   Hypertension: Amlodipine, hctz and losartan.  Left shoulder pain few weeks ago - ok now.  Home readings: BP Readings from Last 3 Encounters:  04/25/21 134/78  04/02/21 126/88  01/24/21 140/78   Lab Results  Component Value Date   CREATININE 1.06 04/02/2021   Prediabetes: Commended on weight loss - diet change and walking.  No home weights.  Lab Results  Component Value Date   HGBA1C 5.8 08/19/2020   Wt Readings from Last 3 Encounters:  04/25/21 245 lb 3.2 oz (111.2 kg)  04/02/21 275 lb 9.2 oz (125 kg)  01/24/21 150 lb (68 kg)   Hyperlipidemia: Lipitor 40mg  qd, no new myalgias/side effects.  Lab Results  Component Value Date   CHOL 232 (H) 04/02/2021   HDL 52 04/02/2021   LDLCALC 172 (H) 04/02/2021   TRIG 38 04/02/2021   CHOLHDL 4.5 04/02/2021   Lab Results  Component Value Date   ALT 21 04/02/2021   AST 22 04/02/2021   ALKPHOS 42 04/02/2021   BILITOT 0.9 04/02/2021    Vitamin D deficiency: On 2000iu per day. Low in past, no recent labs Last vitamin D No results found for: 25OHVITD2, 25OHVITD3, VD25OH  HM - flu vaccine today. Shingles vaccine today.  Covid vaccine booster - recommended. Last one over a year ago.    History Patient Active Problem List   Diagnosis Date Noted   Chest pain 04/02/2021   Dyslipidemia, goal LDL below 130 08/25/2020   Encounter for general adult medical examination with abnormal findings 08/19/2020   Prediabetes 08/19/2020   Screen for colon cancer 08/19/2020   Diuretic-induced hypokalemia 08/19/2020   Chronic  bilateral low back pain without sciatica 08/19/2020   Right bundle branch block 03/11/2010   Essential hypertension 05/14/2009   PARASOMNIA 05/14/2009   G E R D 05/13/2009   Past Medical History:  Diagnosis Date   Blood transfusion without reported diagnosis    DEPRESSION, MILD    Dysuria    G E R D    Hypercholesterolemia    Hyperlipidemia    NOT TAKING MEDICATION   Hypertension    Lumbago    NEPHROLITHIASIS, HX OF    PARASOMNIA    Right bundle branch block    Past Surgical History:  Procedure Laterality Date   CARDIAC CATHETERIZATION N/A 01/19/2016   Procedure: Left Heart Cath and Coronary Angiography;  Surgeon: Sherren Mocha, MD;  Location: [facility] [place] [facility];  Service: Cardiovascular;  Laterality: N/A;   HAND SURGERY     right and left hand     Allergies  Allergen Reactions   Ace Inhibitors Cough    Lisinopril caused cough   Prior to Admission medications   Medication Sig Start Date End Date Taking? Authorizing Provider  amLODipine (NORVASC) 10 MG tablet TAKE 1 TABLET BY MOUTH EVERY DAY 04/11/21  Yes [name], MD  aspirin 325 MG tablet Take 162.5 mg by mouth at bedtime.   Yes [provider]  atorvastatin (  LIPITOR) 40 MG tablet TAKE 1 TABLET BY MOUTH EVERY DAY 04/12/21  Yes Janith Lima, MD  Cholecalciferol (VITAMIN D3) 50 MCG (2000 UT) TABS Take one tablet by mouth daily. 04/11/21  Yes [name], MD  fluticasone (FLONASE) 50 MCG/ACT nasal spray USE 1 SPRAY IN BOTH NOSTRILS DAILY Patient taking differently: Place 1 spray into both nostrils daily as needed for allergies. 03/27/20  Yes [name], MD  hydrochlorothiazide (HYDRODIURIL) 25 MG tablet Take 1 tablet (25 mg total) by mouth daily.**PER MD SCHEDULE FOLLOW UP APPOINTMENT** 04/11/21  Yes [name], MD  losartan (COZAAR) 100 MG tablet TAKE 1 TABLET BY MOUTH EVERY DAY 04/11/21  Yes [name], MD  omeprazole (PRILOSEC) 40 MG capsule Take 40 mg bid for 6 weeks to promote  mucosal healing,then reduce to 40 mg daily and titrate to lowest effective dose,or off completely if GI Symptoms resolve. Patient taking differently: Take 40 mg by mouth daily as needed (heartburn). 01/24/21  Yes Cirigliano, Dominic Pea, DO   Social History   Socioeconomic History   Marital status: Married    Spouse name: Not on file   Number of children: 6   Years of education: 15   Highest education level: Not on file  Occupational History   Occupation: Glass blower/designer    Employer: [employer]  Tobacco Use   Smoking status: Former    Types: Cigarettes   Smokeless tobacco: Never  Substance and Sexual Activity   Alcohol use: No    Alcohol/week: 0.0 standard drinks   Drug use: No   Sexual activity: Not on file  Other Topics Concern   Not on file  Social History Narrative   ** Merged History Encounter **       Social Determinants of Health   Financial Resource Strain: Not on file  Food Insecurity: Not on file  Transportation Needs: Not on file  Physical Activity: Not on file  Stress: Not on file  Social Connections: Not on file  Intimate Partner Violence: Not on file    Review of Systems  Constitutional:  Negative for fatigue and unexpected weight change.  Eyes:  Negative for visual disturbance.  Respiratory:  Negative for cough, chest tightness and shortness of breath.   Cardiovascular:  Negative for chest pain, palpitations and leg swelling.  Gastrointestinal:  Negative for abdominal pain and blood in stool.  Neurological:  Negative for dizziness, light-headedness and headaches.    Objective:   Vitals:   04/25/21 1010  BP: 134/78  Pulse: 64  Resp: 16  Temp: 98.4 F (36.9 C)  TempSrc: Temporal  SpO2: 99%  Weight: 245 lb 3.2 oz (111.2 kg)  Height: 6\' 2"  (1.88 m)     Physical Exam Vitals reviewed.  Constitutional:      Appearance: He is well-developed.  HENT:     Head: Normocephalic and atraumatic.  Neck:     Vascular: No carotid bruit or JVD.   Cardiovascular:     Rate and Rhythm: Normal rate and regular rhythm.     Heart sounds: Normal heart sounds. No murmur heard. Pulmonary:     Effort: Pulmonary effort is normal.     Breath sounds: Normal breath sounds. No rales.  Musculoskeletal:     Right lower leg: No edema.     Left lower leg: No edema.  Skin:    General: Skin is warm and dry.  Neurological:     Mental Status: He is alert and oriented to  person, place, and time.  Psychiatric:        Mood and Affect: Mood normal.       Assessment & Plan:  Donald [name] is a 50 y.o. male . Essential hypertension - Plan: hydrochlorothiazide (HYDRODIURIL) 25 MG tablet, Comprehensive metabolic panel  -  Stable, tolerating current regimen. Medications refilled. Labs pending as above.   Prediabetes - Plan: Hemoglobin A1c  - commended on diet/exercise change - check A1c.   Hyperlipidemia, unspecified hyperlipidemia type - Plan: Lipid panel, atorvastatin (LIPITOR) 40 MG tablet  -  Stable, tolerating current regimen. Medications refilled. Labs pending as above.   Vitamin D deficiency - Plan: VITAMIN D 25 Hydroxy (Vit-D Deficiency, Fractures)  - continue otc supplement. Check labs.   Need for influenza vaccination - Plan: Flu Vaccine QUAD 6+ mos PF IM (Fluarix Quad PF)  Need for shingles vaccine - Plan: Varicella-zoster vaccine IM  Recommended covid booster.   Meds ordered this encounter  Medications   hydrochlorothiazide (HYDRODIURIL) 25 MG tablet    Sig: Take 1 tablet (25 mg total) by mouth daily.    Dispense:  90 tablet    Refill:  1   Cholecalciferol (VITAMIN D3) 50 MCG (2000 UT) TABS    Sig: Take one tablet by mouth daily.    Dispense:  90 tablet    Refill:  1   atorvastatin (LIPITOR) 40 MG tablet    Sig: Take 1 tablet (40 mg total) by mouth daily.    Dispense:  90 tablet    Refill:  1   Patient Instructions  No med changes today. Keep up the good work with diet and activity, exercise. Take care.      Signed,   [name], MD [facility] 04/25/21 10:57 AM

## 2021-05-11 ENCOUNTER — Other Ambulatory Visit: Payer: Self-pay | Admitting: Urology

## 2021-05-11 DIAGNOSIS — D49512 Neoplasm of unspecified behavior of left kidney: Secondary | ICD-10-CM

## 2021-05-20 IMAGING — DX DG KNEE COMPLETE 4+V*L*
4 series · 4 of 4 positions shown · non-contrast
Comparison: None.

CLINICAL DATA: Left knee pain and swelling at the tibial tuberosity
for 1 week. No known injury.

EXAM:
LEFT KNEE - COMPLETE 4+ VIEW

[knee ap]
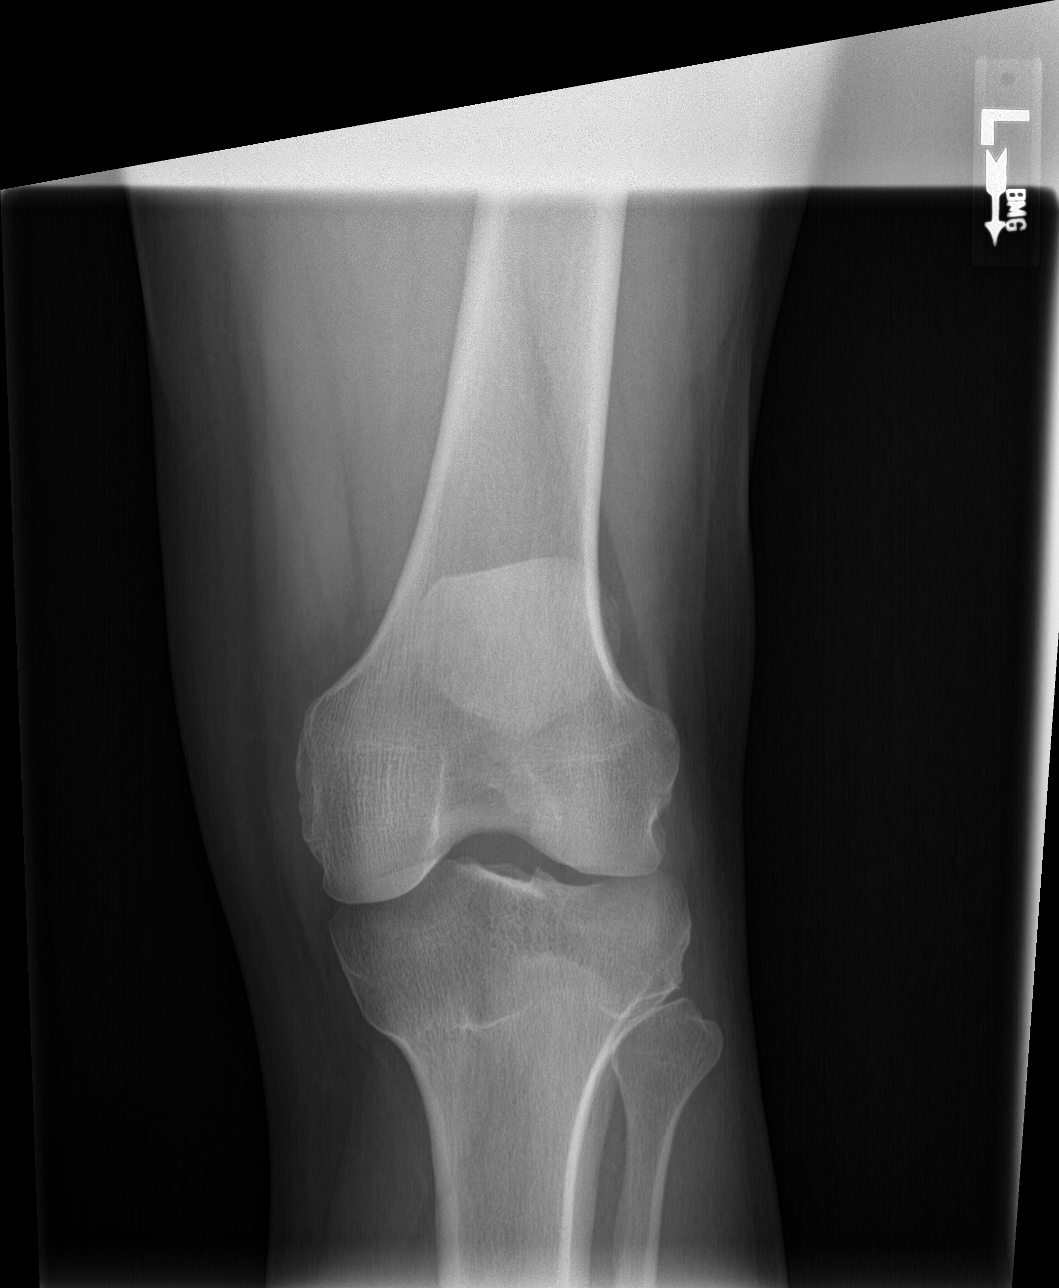

[knee lat]
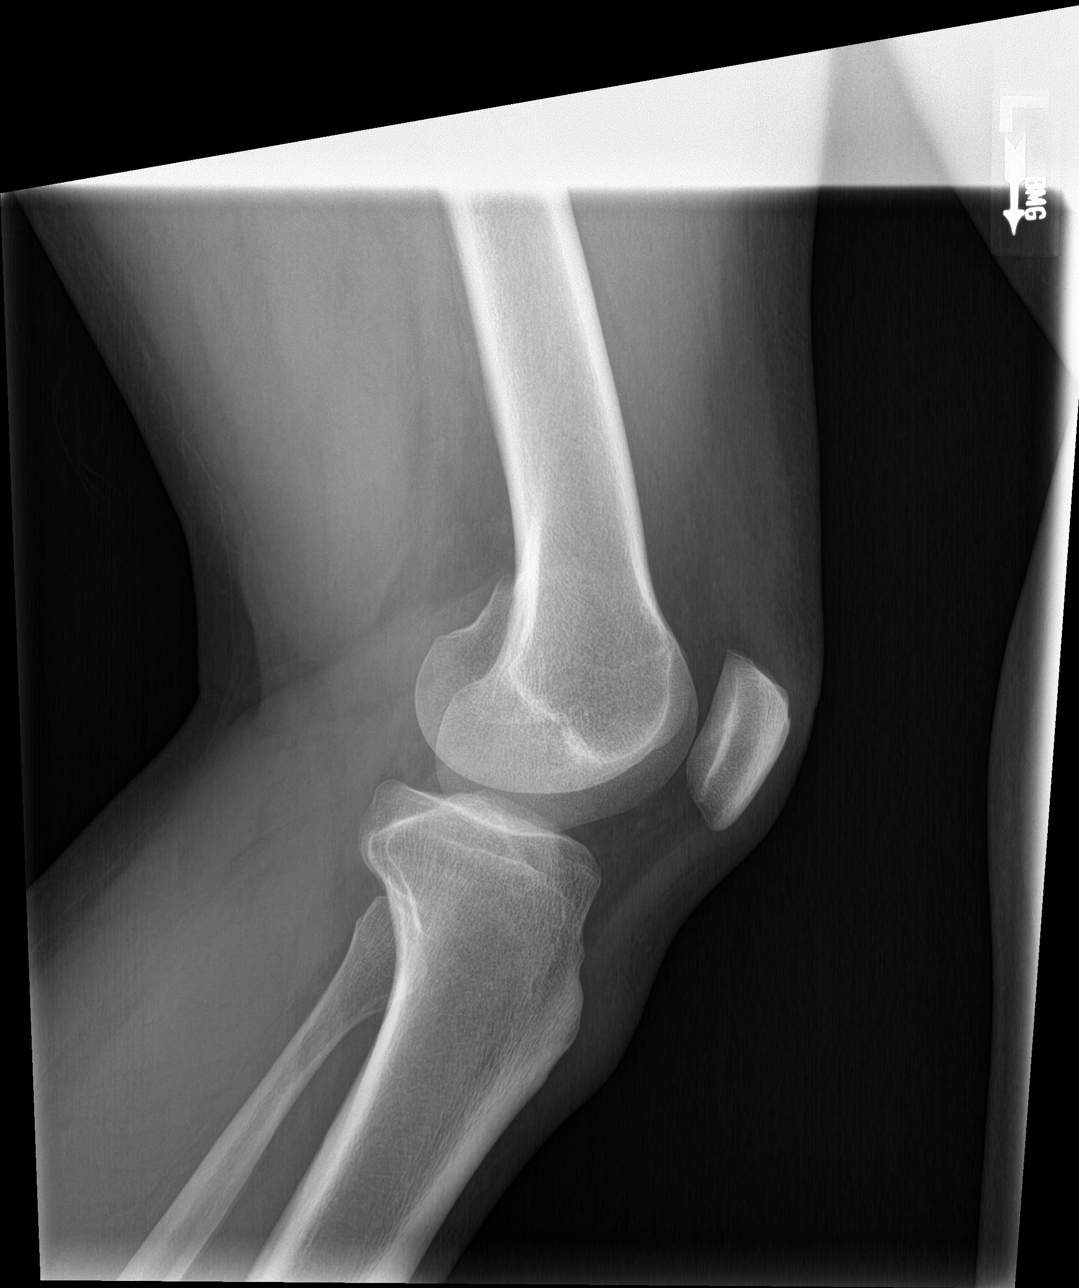

[sunrise]
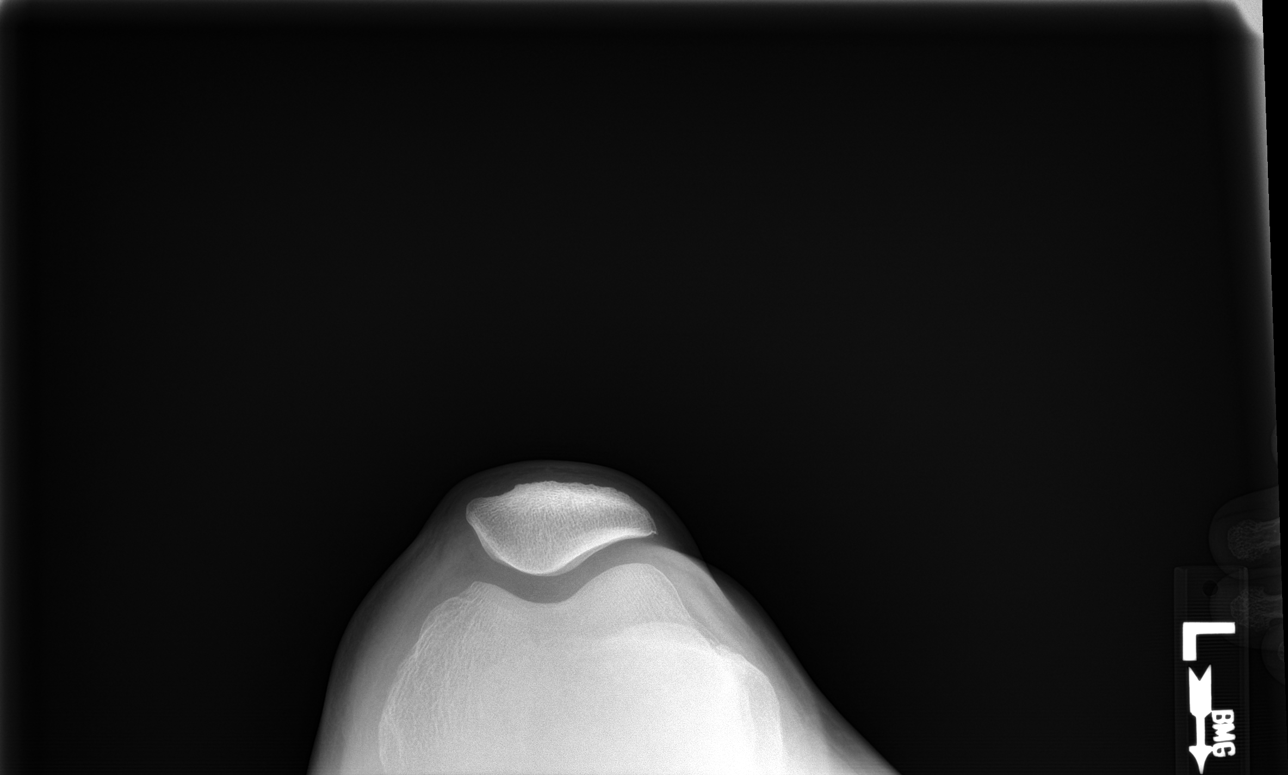

[knee [person_name]]
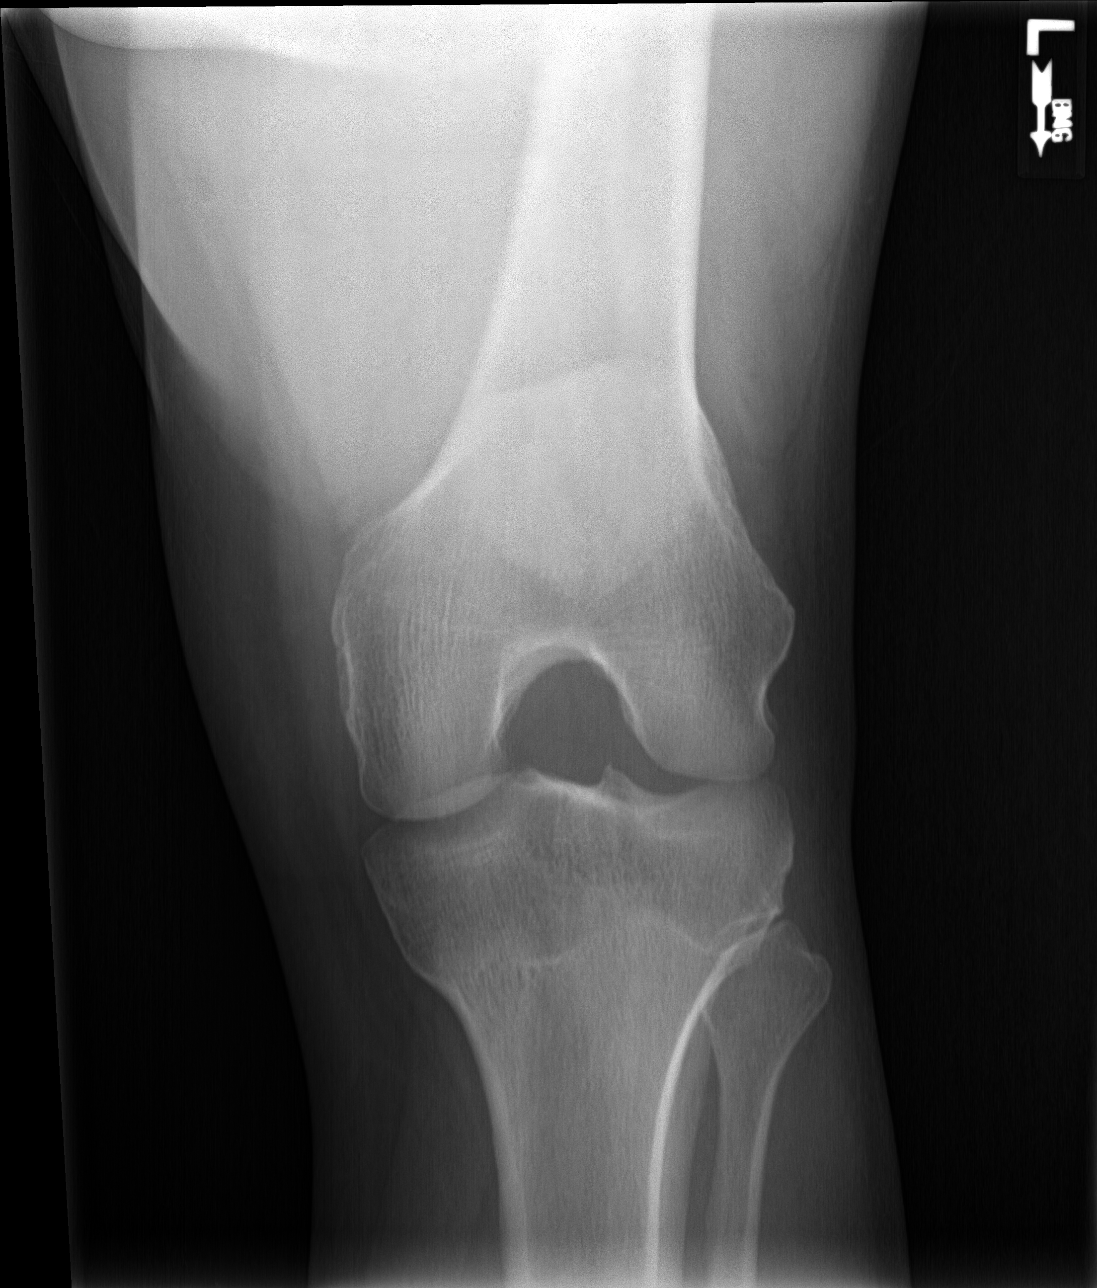

[4 of 4 positions shown; findings below may reference images not displayed]

FINDINGS: No evidence of fracture, dislocation, or joint effusion. No evidence
of arthropathy or other focal bone abnormality. Soft tissue swelling
is seen anterior to the tibial tuberosity.
IMPRESSION: Soft tissue swelling anterior to the tibial tuberosity could be due
to bursitis. The exam is otherwise negative.

## 2021-05-25 ENCOUNTER — Inpatient Hospital Stay: Admission: RE | Admit: 2021-05-25 | Payer: 59 | Source: Ambulatory Visit

## 2021-06-11 ENCOUNTER — Encounter: Payer: Self-pay | Admitting: Family Medicine

## 2021-06-15 ENCOUNTER — Other Ambulatory Visit: Payer: Self-pay | Admitting: Urology

## 2021-06-15 DIAGNOSIS — D49512 Neoplasm of unspecified behavior of left kidney: Secondary | ICD-10-CM

## 2021-06-22 ENCOUNTER — Encounter (HOSPITAL_COMMUNITY): Payer: Self-pay | Admitting: Emergency Medicine

## 2021-08-10 ENCOUNTER — Other Ambulatory Visit: Payer: Self-pay | Admitting: Urology

## 2021-08-10 DIAGNOSIS — D49512 Neoplasm of unspecified behavior of left kidney: Secondary | ICD-10-CM

## 2021-08-23 ENCOUNTER — Other Ambulatory Visit: Payer: 59

## 2021-08-23 ENCOUNTER — Encounter (HOSPITAL_COMMUNITY): Payer: Self-pay

## 2021-08-23 ENCOUNTER — Ambulatory Visit (HOSPITAL_COMMUNITY)
Admission: EM | Admit: 2021-08-23 | Discharge: 2021-08-23 | Disposition: A | Discharge status: Home / Self Care | Payer: 59 | Attending: Physician Assistant | Admitting: Physician Assistant

## 2021-08-23 DIAGNOSIS — J029 Acute pharyngitis, unspecified: Secondary | ICD-10-CM | POA: Insufficient documentation

## 2021-08-23 DIAGNOSIS — J039 Acute tonsillitis, unspecified: Secondary | ICD-10-CM | POA: Insufficient documentation

## 2021-08-23 DIAGNOSIS — R509 Fever, unspecified: Secondary | ICD-10-CM | POA: Diagnosis not present

## 2021-08-23 LAB — POCT RAPID STREP A, ED / UC: Streptococcus, Group A Screen (Direct): NEGATIVE

## 2021-08-23 LAB — POC INFLUENZA A AND B ANTIGEN (URGENT CARE ONLY)
INFLUENZA A ANTIGEN, POC: NEGATIVE
INFLUENZA B ANTIGEN, POC: NEGATIVE

## 2021-08-23 MED ORDER — CEFDINIR 300 MG PO CAPS: 300.0000 mg | ORAL_CAPSULE | Freq: Two times a day (BID) | ORAL | 0 refills | Status: DC

## 2021-08-23 NOTE — ED Provider Notes (Signed)
?[place] ? ? ? ?CSN: [id number] ?Arrival date & time: 08/23/21  1355 ? ? ?  ? ?History   ?Chief Complaint ?Chief Complaint  ?Patient presents with  ? Cough  ? Nasal Congestion  ?  Fever  ? ? ?HPI ?Donald [name] is a 51 y.o. male.  ? ?Patient presents today with a 1 day history of sore throat.  Reports associated fever, body aches, headache, fatigue, malaise.  Denies any congestion, cough, chest pain, shortness of breath, nausea, vomiting.  He has been taking Tylenol and ibuprofen without improvement of symptoms.  Denies any known sick contacts but does have young children.  He does have a history of strep throat with last episode approximately 3 years ago and states current symptoms are similar to previous episodes of this condition.  Pain is rated 8 on a 0-10 pain scale, described as sharp, worse with swallowing, no alleviating factors identified.  He is having difficulty eating and drinking as result of pain but reports no trouble managing secretions.  He denies any recent antibiotics.  Denies any muffled voice, swelling of his throat, shortness of breath. ? ? ?Past Medical History:  ?Diagnosis Date  ? Blood transfusion without reported diagnosis   ? DEPRESSION, MILD   ? Dysuria   ? G E R D   ? Hypercholesterolemia   ? Hyperlipidemia   ? NOT TAKING MEDICATION  ? Hypertension   ? Lumbago   ? NEPHROLITHIASIS, HX OF   ? PARASOMNIA   ? Right bundle branch block   ? ? ?Patient Active Problem List  ? Diagnosis Date Noted  ? Chest pain 04/02/2021  ? Dyslipidemia, goal LDL below 130 08/25/2020  ? Encounter for general adult medical examination with abnormal findings 08/19/2020  ? Prediabetes 08/19/2020  ? Screen for colon cancer 08/19/2020  ? Diuretic-induced hypokalemia 08/19/2020  ? Chronic bilateral low back pain without sciatica 08/19/2020  ? Right bundle branch block 03/11/2010  ? Essential hypertension 05/14/2009  ? PARASOMNIA 05/14/2009  ? G E R D 05/13/2009  ? ? ?Past Surgical History:  ?Procedure  Laterality Date  ? CARDIAC CATHETERIZATION N/A 01/19/2016  ? Procedure: Left Heart Cath and Coronary Angiography;  Surgeon: Sherren Mocha, MD;  Location: [facility];  Service: Cardiovascular;  Laterality: N/A;  ? HAND SURGERY    ? right and left hand    ? ? ? ? ? ?Home Medications   ? ?Prior to Admission medications   ?Medication Sig Start Date End Date Taking? Authorizing Provider  ?cefdinir (OMNICEF) 300 MG capsule Take 1 capsule (300 mg total) by mouth 2 (two) times daily. 08/23/21  Yes Elisah Parmer K, PA-C  ?amLODipine (NORVASC) 10 MG tablet TAKE 1 TABLET BY MOUTH EVERY DAY 04/11/21   [name], MD  ?aspirin 325 MG tablet Take 162.5 mg by mouth at bedtime.    [provider]  ?atorvastatin (LIPITOR) 40 MG tablet Take 1 tablet (40 mg total) by mouth daily. 04/25/21   [name], MD  ?Cholecalciferol (VITAMIN D3) 50 MCG (2000 UT) TABS Take one tablet by mouth daily. 04/25/21   [name], MD  ?fluticasone (FLONASE) 50 MCG/ACT nasal spray USE 1 SPRAY IN BOTH NOSTRILS DAILY ?Patient taking differently: Place 1 spray into both nostrils daily as needed for allergies. 03/27/20   [name], MD  ?hydrochlorothiazide (HYDRODIURIL) 25 MG tablet Take 1 tablet (25 mg total) by mouth daily. 04/25/21   [name], MD  ?losartan Blanchard Kelch)  100 MG tablet TAKE 1 TABLET BY MOUTH EVERY DAY 04/11/21   [name], MD  ?omeprazole (PRILOSEC) 40 MG capsule Take 40 mg bid for 6 weeks to promote mucosal healing,then reduce to 40 mg daily and titrate to lowest effective dose,or off completely if GI Symptoms resolve. ?Patient taking differently: Take 40 mg by mouth daily as needed (heartburn). 01/24/21   Cirigliano, Dominic Pea, DO  ? ? ?Family History ?Family History  ?Problem Relation Age of Onset  ? Hypertension Mother   ? Hypertension Sister   ? ? ?Social History ?Social History  ? ?Tobacco Use  ? Smoking status: Former  ?  Types: Cigarettes  ? Smokeless tobacco: Never  ?Substance Use  Topics  ? Alcohol use: No  ?  Alcohol/week: 0.0 standard drinks  ? Drug use: No  ? ? ? ?Allergies   ?Ace inhibitors ? ? ?Review of Systems ?Review of Systems  ?Constitutional:  Positive for activity change, fatigue and fever. Negative for appetite change.  ?HENT:  Positive for sore throat and trouble swallowing. Negative for congestion, sinus pressure, sneezing and voice change.   ?Respiratory:  Negative for cough and shortness of breath.   ?Cardiovascular:  Negative for chest pain.  ?Gastrointestinal:  Negative for abdominal pain, diarrhea, nausea and vomiting.  ?Musculoskeletal:  Positive for arthralgias and myalgias.  ?Neurological:  Positive for headaches. Negative for dizziness.  ? ? ?Physical Exam ?Triage Vital Signs ?ED Triage Vitals [08/23/21 1501]  ?Enc Vitals Group  ?   BP (!) 146/71  ?   Pulse Rate 98  ?   Resp 16  ?   Temp 100.2 ?F (37.9 ?C)  ?   Temp Source Oral  ?   SpO2 100 %  ?   Weight   ?   Height   ?   Head Circumference   ?   Peak Flow   ?   Pain Score   ?   Pain Loc   ?   Pain Edu?   ?   Excl. in [place]?   ? ?No data found. ? ?Updated Vital Signs ?BP (!) 146/71 (BP Location: Left Arm)   Pulse 98   Temp 100.2 ?F (37.9 ?C) (Oral)   Resp 16   SpO2 100%  ? ?Visual Acuity ?Right Eye Distance:   ?Left Eye Distance:   ?Bilateral Distance:   ? ?Right Eye Near:   ?Left Eye Near:    ?Bilateral Near:    ? ?Physical Exam ?Vitals reviewed.  ?Constitutional:   ?   General: He is awake.  ?   Appearance: Normal appearance. He is well-developed. He is not ill-appearing.  ?   Comments: Very pleasant male appears stated age in no acute distress sitting comfortably in exam room  ?HENT:  ?   Head: Normocephalic and atraumatic.  ?   Right Ear: Tympanic membrane, ear canal and external ear normal. Tympanic membrane is not erythematous or bulging.  ?   Left Ear: Tympanic membrane, ear canal and external ear normal. Tympanic membrane is not erythematous or bulging.  ?   Nose: Nose normal.  ?   Mouth/Throat:  ?    Pharynx: Uvula midline. Posterior oropharyngeal erythema present. No oropharyngeal exudate or uvula swelling.  ?   Tonsils: Tonsillar exudate present. No tonsillar abscesses. 1+ on the right. 1+ on the left.  ?Cardiovascular:  ?   Rate and Rhythm: Normal rate and regular rhythm.  ?   Heart sounds: Normal heart sounds, S1 normal  and S2 normal. No murmur heard. ?Pulmonary:  ?   Effort: Pulmonary effort is normal. No accessory muscle usage or respiratory distress.  ?   Breath sounds: Normal breath sounds. No stridor. No wheezing, rhonchi or rales.  ?   Comments: Clear to auscultation bilaterally ?Lymphadenopathy:  ?   Head:  ?   Right side of head: No submental, submandibular or tonsillar adenopathy.  ?   Left side of head: No submental, submandibular or tonsillar adenopathy.  ?Neurological:  ?   Mental Status: He is alert.  ?Psychiatric:     ?   Behavior: Behavior is cooperative.  ? ? ? ?UC Treatments / Results  ?Labs ?(all labs ordered are listed, but only abnormal results are displayed) ?Labs Reviewed  ?CULTURE, GROUP A STREP [facility])  ?POCT RAPID STREP A, ED / UC  ?POC INFLUENZA A AND B ANTIGEN (URGENT CARE ONLY)  ? ? ?EKG ? ? ?Radiology ?No results found. ? ?Procedures ?Procedures (including critical care time) ? ?Medications Ordered in UC ?Medications - No data to display ? ?Initial Impression / Assessment and Plan / UC Course  ?I have reviewed the triage vital signs and the nursing notes. ? ?Pertinent labs & imaging results that were available during my care of the patient were reviewed by me and considered in my medical decision making (see chart for details). ? ?  ? ?Strep testing was negative in clinic today.  Flu test was negative as well.  COVID-19 testing was deferred as patient has tested positive and recovered from COVID-19 within the last 90 days.  It is too early for mono testing given patient has only been symptomatic for a few days.  Despite negative strep in clinic given exudate on exam and clinical  presentation will cover for infective tonsillitis with cefdinir.  Recommended conservative treatment measures including gargling with warm salt water, Tylenol and ibuprofen for pain and fever.  Discussed that if

## 2021-08-23 NOTE — ED Triage Notes (Signed)
Pt presents to the office for cough and congestion that started last night.  Pt reports a fever today.  ?

## 2021-08-23 NOTE — Discharge Instructions (Signed)
Your strep and flu were negative.  We are going to start an antibiotic given how your tonsils look.  Start Omnicef twice daily.  Make sure to gargle with warm salt water.  Use over-the-counter medication including Tylenol and ibuprofen for fever and pain.  If you have any difficulty swallowing, swelling of your throat, shortness of breath, difficulty speaking, muffled voice you need to go to the emergency room immediately.  If symptoms do not improve within a few days of starting medication you should be reevaluated. ?

## 2021-08-25 LAB — CULTURE, GROUP A STREP (THRC)

## 2021-09-08 ENCOUNTER — Emergency Department (HOSPITAL_COMMUNITY): Payer: 59

## 2021-09-08 ENCOUNTER — Encounter (HOSPITAL_COMMUNITY): Payer: Self-pay

## 2021-09-08 ENCOUNTER — Emergency Department (HOSPITAL_COMMUNITY)
Admission: EM | Admit: 2021-09-08 | Discharge: 2021-09-09 | Disposition: A | Discharge status: Home / Self Care | Payer: 59 | Attending: Emergency Medicine | Admitting: Emergency Medicine

## 2021-09-08 DIAGNOSIS — Z20822 Contact with and (suspected) exposure to covid-19: Secondary | ICD-10-CM | POA: Diagnosis not present

## 2021-09-08 DIAGNOSIS — I10 Essential (primary) hypertension: Secondary | ICD-10-CM | POA: Insufficient documentation

## 2021-09-08 DIAGNOSIS — Z7982 Long term (current) use of aspirin: Secondary | ICD-10-CM | POA: Insufficient documentation

## 2021-09-08 DIAGNOSIS — Z79899 Other long term (current) drug therapy: Secondary | ICD-10-CM | POA: Insufficient documentation

## 2021-09-08 DIAGNOSIS — R079 Chest pain, unspecified: Secondary | ICD-10-CM | POA: Diagnosis not present

## 2021-09-08 DIAGNOSIS — D72829 Elevated white blood cell count, unspecified: Secondary | ICD-10-CM | POA: Diagnosis not present

## 2021-09-08 DIAGNOSIS — R509 Fever, unspecified: Secondary | ICD-10-CM | POA: Diagnosis present

## 2021-09-08 DIAGNOSIS — J020 Streptococcal pharyngitis: Secondary | ICD-10-CM

## 2021-09-08 LAB — RESP PANEL BY RT-PCR (FLU A&B, COVID) ARPGX2
Influenza A by PCR: NEGATIVE
Influenza B by PCR: NEGATIVE
SARS Coronavirus 2 by RT PCR: NEGATIVE

## 2021-09-08 LAB — BASIC METABOLIC PANEL
Anion gap: 7 (ref 5–15)
BUN: 17 mg/dL (ref 6–20)
CO2: 24 mmol/L (ref 22–32)
Calcium: 8.9 mg/dL (ref 8.9–10.3)
Chloride: 102 mmol/L (ref 98–111)
Creatinine, Ser: 1.2 mg/dL (ref 0.61–1.24)
GFR, Estimated: >60 mL/min (ref >60)
Glucose, Bld: 108 mg/dL — ABNORMAL HIGH (ref 70–99)
Potassium: 3.5 mmol/L (ref 3.5–5.1)
Sodium: 133 mmol/L — ABNORMAL LOW (ref 135–145)

## 2021-09-08 LAB — CBC
HCT: 36.4 % — ABNORMAL LOW (ref 39.0–52.0)
Hemoglobin: 12.5 g/dL — ABNORMAL LOW (ref 13.0–17.0)
MCH: 28.6 pg (ref 26.0–34.0)
MCHC: 34.3 g/dL (ref 30.0–36.0)
MCV: 83.3 fL (ref 80.0–100.0)
Platelets: 382 10*3/uL (ref 150–400)
RBC: 4.37 MIL/uL (ref 4.22–5.81)
RDW: 14.5 % (ref 11.5–15.5)
WBC: 10.8 10*3/uL — ABNORMAL HIGH (ref 4.0–10.5)
nRBC: 0 % (ref 0.0–0.2)

## 2021-09-08 LAB — TROPONIN I (HIGH SENSITIVITY)
Troponin I (High Sensitivity): 5 ng/L (ref <18)
Troponin I (High Sensitivity): 5 ng/L (ref <18)

## 2021-09-08 MED ORDER — IBUPROFEN 200 MG PO TABS
600.0000 mg | ORAL_TABLET | Freq: Once | ORAL | Status: AC
  Administered 2021-09-08: 600 mg via ORAL
  Filled 2021-09-08: qty 1

## 2021-09-08 NOTE — ED Triage Notes (Signed)
Pt c/o ongoing fever, sore throat, runny nose for two weeks. Pt reports new chest pressure radiating to his back that began yesterday, worsening with exertion. Last tylenol dose 1400 ?

## 2021-09-08 NOTE — ED Provider Triage Note (Signed)
Emergency Medicine Provider Triage Evaluation Note ? ?Donald [name] , [name] 51 y.o. male  was evaluated in triage.  Pt complains of fever onset today. He notes that his symptoms are similar to when he was seen at [facility] on 08/31/21. He has tried 1,000 mg tylenol at 2 PM. Finished the Rx cefidiner. Has associated rhinorrhea, nasal congestion, sore throat, chest pain, back pain.  ? ?Review of Systems  ?Positive: As per HPI above ?Negative:  ? ?Physical Exam  ?BP 121/75 (BP Location: Right Arm)   Pulse (!) 113   Temp (!) 102.8 ?F (39.3 ?C) (Oral)   Resp (!) 22   SpO2 99%  ?Gen:   Awake, no distress   ?Resp:  Normal effort  ?MSK:   Moves extremities without difficulty  ?Other:  Rhinorrhea, patent airway.  Uvula midline without swelling.  No chest wall tenderness to palpation. ? ?Medical Decision Making  ?Medically screening exam initiated at 6:38 PM.  Appropriate orders placed.  Donald [name] was informed that the remainder of the evaluation will be completed by another provider, this initial triage assessment does not replace that evaluation, and the importance of remaining in the ED until their evaluation is complete. ?  ?Donald Deleeuw A, PA-C ?09/08/21 1844 ? ?

## 2021-09-09 ENCOUNTER — Encounter (HOSPITAL_COMMUNITY): Payer: Self-pay

## 2021-09-09 ENCOUNTER — Emergency Department (HOSPITAL_COMMUNITY): Payer: 59

## 2021-09-09 LAB — GROUP A STREP BY PCR: Group A Strep by PCR: DETECTED — AB

## 2021-09-09 MED ORDER — SODIUM CHLORIDE 0.9 % IV BOLUS
1000.0000 mL | Freq: Once | INTRAVENOUS | Status: AC
Start: 2021-09-09 — End: 2021-09-09
  Administered 2021-09-09: 1000 mL via INTRAVENOUS

## 2021-09-09 MED ORDER — PENICILLIN V POTASSIUM 500 MG PO TABS: 500.0000 mg | ORAL_TABLET | Freq: Three times a day (TID) | ORAL | 0 refills | Status: DC

## 2021-09-09 MED ORDER — KETOROLAC TROMETHAMINE 60 MG/2ML IM SOLN
30.0000 mg | Freq: Once | INTRAMUSCULAR | Status: AC
  Administered 2021-09-09: 30 mg via INTRAMUSCULAR
  Filled 2021-09-09: qty 2

## 2021-09-09 MED ORDER — DEXAMETHASONE SODIUM PHOSPHATE 4 MG/ML IJ SOLN
4.0000 mg | Freq: Once | INTRAMUSCULAR | Status: AC
  Administered 2021-09-09: 4 mg via INTRAMUSCULAR
  Filled 2021-09-09: qty 1

## 2021-09-09 MED ORDER — IOHEXOL 300 MG/ML  SOLN
75.0000 mL | Freq: Once | INTRAMUSCULAR | Status: AC | PRN
  Administered 2021-09-09: 75 mL via INTRAVENOUS

## 2021-09-09 NOTE — Discharge Instructions (Addendum)
You have been seen and discharged from the emergency department. Take antibiotic as directed. Take Tylenol and Ibuprofen as needed for pain control. Stay well hydrated. Follow-up with your primary provider for further evaluation and further care. Take home medications as prescribed. If you have any worsening symptoms or further concerns for your health please return to an emergency department for further evaluation. ?

## 2021-09-09 NOTE — ED Provider Notes (Signed)
?[place] ?Provider Note ? ?CSN: [id number] ?Arrival date & time: 09/08/21 1743 ? ?Chief Complaint(s) ?Fever, Sore Throat, and Chest Pain ? ?HPI ?Donald [name] is a 51 y.o. male   ? ?The history is provided by the patient.  ?Sore Throat ?This is a recurrent problem. Episode onset: 1 month. Episode frequency: recurring. Associated symptoms include chest pain. Pertinent negatives include no abdominal pain, no headaches and no shortness of breath. Exacerbated by: swallowing. Nothing relieves the symptoms. He has tried acetaminophen for the symptoms.  ?Chest Pain ?Pain location:  L chest and R chest ?Pain quality: dull   ?Pain radiates to:  Does not radiate ?Pain severity:  Mild ?Onset quality:  Gradual ?Progression:  Resolved ?Chronicity:  New ?Context: movement   ?Relieved by:  Nothing ?Worsened by:  Movement ?Associated symptoms: no abdominal pain, no headache and no shortness of breath   ? ?Patient was seen at [facility] on 3/21 and rx'd cefdinir, which he completed. ?Sore throat and fever returned again in the last 2 days. ? ?Past Medical History ?Past Medical History:  ?Diagnosis Date  ? Blood transfusion without reported diagnosis   ? DEPRESSION, MILD   ? Dysuria   ? G E R D   ? Hypercholesterolemia   ? Hyperlipidemia   ? NOT TAKING MEDICATION  ? Hypertension   ? Lumbago   ? NEPHROLITHIASIS, HX OF   ? PARASOMNIA   ? Right bundle branch block   ? ?Patient Active Problem List  ? Diagnosis Date Noted  ? Chest pain 04/02/2021  ? Dyslipidemia, goal LDL below 130 08/25/2020  ? Encounter for general adult medical examination with abnormal findings 08/19/2020  ? Prediabetes 08/19/2020  ? Screen for colon cancer 08/19/2020  ? Diuretic-induced hypokalemia 08/19/2020  ? Chronic bilateral low back pain without sciatica 08/19/2020  ? Right bundle branch block 03/11/2010  ? Essential hypertension 05/14/2009  ? PARASOMNIA 05/14/2009  ? G E R D 05/13/2009  ? ?Home Medication(s) ?Prior to  Admission medications   ?Medication Sig Start Date End Date Taking? Authorizing Provider  ?amLODipine (NORVASC) 10 MG tablet TAKE 1 TABLET BY MOUTH EVERY DAY 04/11/21   [name], MD  ?aspirin 325 MG tablet Take 162.5 mg by mouth at bedtime.    [provider]  ?atorvastatin (LIPITOR) 40 MG tablet Take 1 tablet (40 mg total) by mouth daily. 04/25/21   [name], MD  ?cefdinir (OMNICEF) 300 MG capsule Take 1 capsule (300 mg total) by mouth 2 (two) times daily. 08/23/21   Raspet, Derry Skill, PA-C  ?Cholecalciferol (VITAMIN D3) 50 MCG (2000 UT) TABS Take one tablet by mouth daily. 04/25/21   [name], MD  ?fluticasone (FLONASE) 50 MCG/ACT nasal spray USE 1 SPRAY IN BOTH NOSTRILS DAILY ?Patient taking differently: Place 1 spray into both nostrils daily as needed for allergies. 03/27/20   [name], MD  ?hydrochlorothiazide (HYDRODIURIL) 25 MG tablet Take 1 tablet (25 mg total) by mouth daily. 04/25/21   [name], MD  ?losartan (COZAAR) 100 MG tablet TAKE 1 TABLET BY MOUTH EVERY DAY 04/11/21   [name], MD  ?omeprazole (PRILOSEC) 40 MG capsule Take 40 mg bid for 6 weeks to promote mucosal healing,then reduce to 40 mg daily and titrate to lowest effective dose,or off completely if GI Symptoms resolve. ?Patient taking differently: Take 40 mg by mouth daily as needed (heartburn). 01/24/21   Cirigliano, Dominic Pea, DO  ?                                                                                                                                  ?  Allergies ?Ace inhibitors ? ?Review of Systems ?Review of Systems  ?Respiratory:  Negative for shortness of breath.   ?Cardiovascular:  Positive for chest pain.  ?Gastrointestinal:  Negative for abdominal pain.  ?Neurological:  Negative for headaches.  ?As noted in HPI ? ?Physical Exam ?Vital Signs  ?I have reviewed the triage vital signs ?BP (!) 141/75 (BP Location: Right Arm)   Pulse 86   Temp 99.5 ?F (37.5 ?C) (Oral)   Resp 18    SpO2 100%  ? ?Physical Exam ?Vitals reviewed.  ?Constitutional:   ?   General: He is not in acute distress. ?   Appearance: He is well-developed. He is not diaphoretic.  ?HENT:  ?   Head: Normocephalic and atraumatic.  ?   Nose: Nose normal.  ?   Mouth/Throat:  ?   Pharynx: Pharyngeal swelling and posterior oropharyngeal erythema present. No oropharyngeal exudate or uvula swelling.  ?   Tonsils: No tonsillar exudate. 1+ on the right. 2+ on the left.  ?Eyes:  ?   General: No scleral icterus.    ?   Right eye: No discharge.     ?   Left eye: No discharge.  ?   Conjunctiva/sclera: Conjunctivae normal.  ?   Pupils: Pupils are equal, round, and reactive to light.  ?Cardiovascular:  ?   Rate and Rhythm: Normal rate and regular rhythm.  ?   Heart sounds: No murmur heard. ?  No friction rub. No gallop.  ?Pulmonary:  ?   Effort: Pulmonary effort is normal. No respiratory distress.  ?   Breath sounds: Normal breath sounds. No stridor. No rales.  ?Abdominal:  ?   General: There is no distension.  ?   Palpations: Abdomen is soft.  ?   Tenderness: There is no abdominal tenderness.  ?Musculoskeletal:     ?   General: No tenderness.  ?   Cervical back: Normal range of motion and neck supple.  ?Lymphadenopathy:  ?   Head:  ?   Right side of head: No submandibular or tonsillar adenopathy.  ?   Left side of head: Submandibular and tonsillar adenopathy present.  ?Skin: ?   General: Skin is warm and dry.  ?   Findings: No erythema or rash.  ?Neurological:  ?   Mental Status: He is alert and oriented to person, place, and time.  ? ? ?ED Results and Treatments ?Labs ?(all labs ordered are listed, but only abnormal results are displayed) ?Labs Reviewed  ?BASIC METABOLIC PANEL - Abnormal; Notable for the following components:  ?    Result Value  ? Sodium 133 (*)   ? Glucose, Bld 108 (*)   ? All other components within normal limits  ?CBC - Abnormal; Notable for the following components:  ? WBC 10.8 (*)   ? Hemoglobin 12.5 (*)   ? HCT  36.4 (*)   ? All other components within normal limits  ?RESP PANEL BY RT-PCR (FLU A&B, COVID) [id number]  ?GROUP A STREP BY PCR  ?TROPONIN I (HIGH SENSITIVITY)  ?TROPONIN I (HIGH SENSITIVITY)  ?                                                                                                                       ?  EKG ? EKG Interpretation ? ?Date/Time:  Thursday September 08 2021 18:23:09 EDT ?Ventricular Rate:  112 ?PR Interval:  156 ?QRS Duration: 148 ?QT Interval:  368 ?QTC Calculation: 502 ?R Axis:   94 ?Text Interpretation: Sinus tachycardia Right bundle branch block Abnormal ECG When compared with ECG of 02-Apr-2021 00:38, PREVIOUS ECG IS PRESENT Confirmed by Addison Lank [phone]) on 09/09/2021 6:10:46 AM ?  ? ?  ? ?Radiology ?DG Chest 2 View ? ?Result Date: 09/08/2021 ?CLINICAL DATA:  Chest pain, fever EXAM: CHEST - 2 VIEW COMPARISON:  04/02/2021 chest radiograph. FINDINGS: Stable cardiomediastinal silhouette with normal heart size. No pneumothorax. No pleural effusion. Lungs appear clear, with no acute consolidative airspace disease and no pulmonary edema. IMPRESSION: No active cardiopulmonary disease. Electronically Signed   By: Ilona Sorrel M.D.   On: 09/08/2021 19:46   ? ?Pertinent labs & imaging results that were available during my care of the patient were reviewed by me and considered in my medical decision making (see MDM for details). ? ?Medications Ordered in ED ?Medications  ?ketorolac (TORADOL) injection 30 mg (has no administration in time range)  ?ibuprofen (ADVIL) tablet 600 mg (600 mg Oral Given 09/08/21 1900)  ?sodium chloride 0.9 % bolus 1,000 mL (1,000 mLs Intravenous New Bag/Given 09/09/21 0730)  ?                                                               ?                                                                    ?Procedures ?Procedures ? ?(including critical care time) ? ?Medical Decision Making / ED Course ? ? ? Complexity of Problem: ? ?Co-morbidities/SDOH that complicate the patient  evaluation/care: ?none ? ?Additional history obtained: ?UC note. Negative strep. Rx cefdinit ? ?Patient's presenting problem/concern, DDX, and MDM listed below: ?Fever ?With recurrent tonsillitis ?Exam concerning for

## 2021-09-09 NOTE — ED Provider Notes (Signed)
Patient signed out to me by previous provider. Please refer to their note for full HPI.  Briefly this is a 51 year old male here with sore throat. Had previous symptoms with recent antibiotic usage. Now with ongoing symptoms and unilateral swelling. Febrile on arrival, mild WBC count. Pending CT for further evaluation.  ?Physical Exam  ?BP (!) 141/75 (BP Location: Right Arm)   Pulse 86   Temp 99.5 ?F (37.5 ?C) (Oral)   Resp 18   SpO2 100%  ? ?Physical Exam ?Vitals and nursing note reviewed.  ?Constitutional:   ?   General: He is not in acute distress. ?   Appearance: Normal appearance.  ?HENT:  ?   Head: Normocephalic.  ?   Mouth/Throat:  ?   Mouth: Mucous membranes are moist.  ?   Pharynx: Pharyngeal swelling and posterior oropharyngeal erythema present. No uvula swelling.  ?Cardiovascular:  ?   Rate and Rhythm: Normal rate.  ?Pulmonary:  ?   Effort: Pulmonary effort is normal. No respiratory distress.  ?Abdominal:  ?   Tenderness: There is no abdominal tenderness.  ?Skin: ?   General: Skin is warm.  ?Neurological:  ?   Mental Status: He is alert and oriented to person, place, and time. Mental status is at baseline.  ? ? ?Procedures  ?Procedures ? ?ED Course / MDM  ?  ?Medical Decision Making ?Amount and/or Complexity of Data Reviewed ?Labs: ordered. ?Radiology: ordered. ? ?Risk ?Prescription drug management. ? ? ?CT shows prominent lymphoid tissue of the neck without focal abscess/finding. Strep test came back positive today. Patient recently completed cefdinir with minimal improvement. Will plan for penicillin treatment and outpatient follow up.  ? ?Patient at this time appears safe and stable for discharge and close outpatient follow up. Discharge plan and strict return to ED precautions discussed, patient verbalizes understanding and agreement. ? ? ?  ?Lorelle Gibbs, DO ?09/09/21 5188 ? ?

## 2021-09-26 ENCOUNTER — Other Ambulatory Visit: Payer: Self-pay | Admitting: Family Medicine

## 2021-09-26 ENCOUNTER — Ambulatory Visit
Admission: RE | Admit: 2021-09-26 | Discharge: 2021-09-26 | Disposition: A | Discharge status: Home / Self Care | Payer: 59 | Source: Ambulatory Visit | Attending: Urology | Admitting: Urology

## 2021-09-26 DIAGNOSIS — E785 Hyperlipidemia, unspecified: Secondary | ICD-10-CM

## 2021-09-26 DIAGNOSIS — D49512 Neoplasm of unspecified behavior of left kidney: Secondary | ICD-10-CM

## 2021-09-26 DIAGNOSIS — I10 Essential (primary) hypertension: Secondary | ICD-10-CM

## 2021-09-26 MED ORDER — GADOBENATE DIMEGLUMINE 529 MG/ML IV SOLN
20.0000 mL | Freq: Once | INTRAVENOUS | Status: AC | PRN
  Administered 2021-09-26: 20 mL via INTRAVENOUS

## 2021-10-24 ENCOUNTER — Encounter: Payer: Self-pay | Admitting: Family Medicine

## 2021-10-24 ENCOUNTER — Ambulatory Visit (INDEPENDENT_AMBULATORY_CARE_PROVIDER_SITE_OTHER): Payer: 59 | Admitting: Family Medicine

## 2021-10-24 VITALS — BP 118/70 | HR 64 | Temp 98.3°F | Resp 16 | Ht 74.0 in | Wt 241.8 lb

## 2021-10-24 DIAGNOSIS — R252 Cramp and spasm: Secondary | ICD-10-CM

## 2021-10-24 DIAGNOSIS — E559 Vitamin D deficiency, unspecified: Secondary | ICD-10-CM

## 2021-10-24 DIAGNOSIS — E785 Hyperlipidemia, unspecified: Secondary | ICD-10-CM | POA: Diagnosis not present

## 2021-10-24 DIAGNOSIS — R7303 Prediabetes: Secondary | ICD-10-CM | POA: Diagnosis not present

## 2021-10-24 DIAGNOSIS — B353 Tinea pedis: Secondary | ICD-10-CM

## 2021-10-24 DIAGNOSIS — I10 Essential (primary) hypertension: Secondary | ICD-10-CM

## 2021-10-24 DIAGNOSIS — R234 Changes in skin texture: Secondary | ICD-10-CM

## 2021-10-24 LAB — COMPREHENSIVE METABOLIC PANEL
ALT: 13 U/L (ref 0–53)
AST: 16 U/L (ref 0–37)
Albumin: 4.4 g/dL (ref 3.5–5.2)
Alkaline Phosphatase: 46 U/L (ref 39–117)
BUN: 17 mg/dL (ref 6–23)
CO2: 30 mEq/L (ref 19–32)
Calcium: 9.8 mg/dL (ref 8.4–10.5)
Chloride: 102 mEq/L (ref 96–112)
Creatinine, Ser: 0.82 mg/dL (ref 0.40–1.50)
GFR: 100.42 mL/min (ref >60.00)
Glucose, Bld: 87 mg/dL (ref 70–99)
Potassium: 4.1 mEq/L (ref 3.5–5.1)
Sodium: 139 mEq/L (ref 135–145)
Total Bilirubin: 0.6 mg/dL (ref 0.2–1.2)
Total Protein: 8 g/dL (ref 6.0–8.3)

## 2021-10-24 LAB — LIPID PANEL
Cholesterol: 223 mg/dL — ABNORMAL HIGH (ref 0–200)
HDL: 51.5 mg/dL (ref >39.00)
LDL Cholesterol: 159 mg/dL — ABNORMAL HIGH (ref 0–99)
NonHDL: 171.61
Total CHOL/HDL Ratio: 4
Triglycerides: 63 mg/dL (ref 0.0–149.0)
VLDL: 12.6 mg/dL (ref 0.0–40.0)

## 2021-10-24 LAB — HEMOGLOBIN A1C: Hgb A1c MFr Bld: 5.9 % (ref 4.6–6.5)

## 2021-10-24 LAB — VITAMIN D 25 HYDROXY (VIT D DEFICIENCY, FRACTURES): VITD: 38.7 ng/mL (ref 30.00–100.00)

## 2021-10-24 MED ORDER — HYDROCHLOROTHIAZIDE 25 MG PO TABS: 25.0000 mg | ORAL_TABLET | Freq: Every day | ORAL | 1 refills | Status: DC

## 2021-10-24 MED ORDER — CLOTRIMAZOLE 1 % EX CREA: 1.0000 "application " | TOPICAL_CREAM | Freq: Two times a day (BID) | CUTANEOUS | 0 refills | Status: DC

## 2021-10-24 MED ORDER — LOSARTAN POTASSIUM 100 MG PO TABS: 100.0000 mg | ORAL_TABLET | Freq: Every day | ORAL | 1 refills | Status: DC

## 2021-10-24 MED ORDER — ATORVASTATIN CALCIUM 20 MG PO TABS: 20.0000 mg | ORAL_TABLET | Freq: Every day | ORAL | 3 refills | Status: DC

## 2021-10-24 NOTE — Patient Instructions (Addendum)
Try lower dose atorvastatin. If cramps do not improve, follow up to look at other causes. Keep up the good work with diet and exercise and weight loss.  Return to the clinic or go to the nearest emergency room if any of your symptoms worsen or new symptoms occur.  Vaseline ok if dry/cracked skin. Antifungal cream between toes 2 time per day and recheck in 2 weeks. If any redness around cracks, swelling or discharge, be seen right away.

## 2021-10-24 NOTE — Progress Notes (Signed)
Subjective:  Patient ID: Donald [name], male    DOB: [birth year]  Age: 51 y.o. MRN: [id number]  CC:  Chief Complaint  Patient presents with   Hypertension    Pt here fo 6 mon f/u, denies physical sxs    Labs Only    Recheck vitamin D level as well as potassium    Hyperlipidemia    Pt would like cholesterol check no specific concerns notes he has had abnormal value in the past    foot fungus    Pt reports recurring fungus on both feet under side of toes, pt reports some itching and dry skin, has been given cream before that helps but has not resolved completely     HPI Donald [name] presents for   Declines translator.   Hypertension: With history of right bundle branch block. Treated with amlodipine, hydrochlorothiazide, losartan.  No new medication side effects. Home readings: 125/76. BP Readings from Last 3 Encounters:  10/24/21 118/70  09/09/21 127/71  08/23/21 (!) 146/71   Lab Results  Component Value Date   CREATININE 1.20 09/08/2021   Hyperlipidemia: Lipitor 40 mg daily, sometimes leg cramps in calves, upper arms at times past month. Feels like tolerated lower dose meds better.  Lab Results  Component Value Date   CHOL 145 04/25/2021   HDL 50.00 04/25/2021   LDLCALC 81 04/25/2021   TRIG 68.0 04/25/2021   CHOLHDL 3 04/25/2021   Lab Results  Component Value Date   ALT 15 04/25/2021   AST 16 04/25/2021   ALKPHOS 49 04/25/2021   BILITOT 0.5 04/25/2021   Prediabetes: Improved diet and walking has assisted with weight loss previously.  Weight is down 4 pounds from November. Still watching diet and walking for exercise.  Lab Results  Component Value Date   HGBA1C 6.0 04/25/2021   Wt Readings from Last 3 Encounters:  10/24/21 241 lb 12.8 oz (109.7 kg)  04/25/21 245 lb 3.2 oz (111.2 kg)  04/02/21 275 lb 9.2 oz (125 kg)    Vitamin D deficiency Over-the-counter supplementation 2000 units/day in past.   Stable on last reading in November. Off meds  past month as feeling well.  Last vitamin D Lab Results  Component Value Date   VD25OH 44.88 04/25/2021   Foot fungus, cracking.  Chart reviewed.  cellulitis between toes treated previously with clotrimazole and doxycycline by urgent care in June 2022.  Seen in ER 3 days later, changed to clindamycin.X-ray without sign of osteomyelitis.  Some dryness and openings under toes past 10 days. Sore at times.  No fever. No swelling  Tx: vaseline.     History Patient Active Problem List   Diagnosis Date Noted   Chest pain 04/02/2021   Dyslipidemia, goal LDL below 130 08/25/2020   Encounter for general adult medical examination with abnormal findings 08/19/2020   Prediabetes 08/19/2020   Screen for colon cancer 08/19/2020   Diuretic-induced hypokalemia 08/19/2020   Chronic bilateral low back pain without sciatica 08/19/2020   Right bundle branch block 03/11/2010   Essential hypertension 05/14/2009   PARASOMNIA 05/14/2009   G E R D 05/13/2009   Past Medical History:  Diagnosis Date   Blood transfusion without reported diagnosis    DEPRESSION, MILD    Dysuria    G E R D    Hypercholesterolemia    Hyperlipidemia    NOT TAKING MEDICATION   Hypertension    Lumbago    NEPHROLITHIASIS, HX OF    PARASOMNIA    Right  bundle branch block    Past Surgical History:  Procedure Laterality Date   CARDIAC CATHETERIZATION N/A 01/19/2016   Procedure: Left Heart Cath and Coronary Angiography;  Surgeon: Sherren Mocha, MD;  Location: [facility];  Service: Cardiovascular;  Laterality: N/A;   HAND SURGERY     right and left hand     Allergies  Allergen Reactions   Ace Inhibitors Cough    Lisinopril caused cough   Prior to Admission medications   Medication Sig Start Date End Date Taking? Authorizing Provider  amLODipine (NORVASC) 10 MG tablet TAKE 1 TABLET BY MOUTH EVERY DAY 09/27/21  Yes [name], MD  aspirin 325 MG tablet Take 162.5 mg by mouth at bedtime.   Yes [provider]  atorvastatin (LIPITOR) 40 MG tablet TAKE 1 TABLET BY MOUTH EVERY DAY 09/27/21  Yes [name], MD  fluticasone (FLONASE) 50 MCG/ACT nasal spray USE 1 SPRAY IN BOTH NOSTRILS DAILY Patient taking differently: Place 1 spray into both nostrils daily as needed for allergies. 03/27/20  Yes [name], MD  hydrochlorothiazide (HYDRODIURIL) 25 MG tablet TAKE 1 TABLET (25 MG TOTAL) BY MOUTH DAILY. 09/27/21  Yes [name], MD  losartan (COZAAR) 100 MG tablet TAKE 1 TABLET BY MOUTH EVERY DAY 09/27/21  Yes [name], MD  Cholecalciferol (VITAMIN D3) 50 MCG (2000 UT) TABS Take one tablet by mouth daily. Patient not taking: Reported on 10/24/2021 04/25/21   [name], MD  omeprazole (PRILOSEC) 40 MG capsule Take 40 mg bid for 6 weeks to promote mucosal healing,then reduce to 40 mg daily and titrate to lowest effective dose,or off completely if GI Symptoms resolve. Patient not taking: Reported on 10/24/2021 01/24/21   Lavena Bullion, DO   Social History   Socioeconomic History   Marital status: Married    Spouse name: Not on file   Number of children: 6   Years of education: 15   Highest education level: Not on file  Occupational History   Occupation: Glass blower/designer    Employer: [employer]  Tobacco Use   Smoking status: Former    Types: Cigarettes   Smokeless tobacco: Never  Substance and Sexual Activity   Alcohol use: No    Alcohol/week: 0.0 standard drinks   Drug use: No   Sexual activity: Not on file  Other Topics Concern   Not on file  Social History Narrative   ** Merged History Encounter **       Social Determinants of Health   Financial Resource Strain: Not on file  Food Insecurity: Not on file  Transportation Needs: Not on file  Physical Activity: Not on file  Stress: Not on file  Social Connections: Not on file  Intimate Partner Violence: Not on file    Review of Systems  Constitutional:  Negative for fatigue and  unexpected weight change.  Eyes:  Negative for visual disturbance.  Respiratory:  Negative for cough, chest tightness and shortness of breath.   Cardiovascular:  Negative for chest pain, palpitations and leg swelling.  Gastrointestinal:  Negative for abdominal pain and blood in stool.  Neurological:  Negative for dizziness, light-headedness and headaches.    Objective:   Vitals:   10/24/21 1027  BP: 118/70  Pulse: 64  Resp: 16  Temp: 98.3 F (36.8 C)  TempSrc: Temporal  SpO2: 97%  Weight: 241 lb 12.8 oz (109.7 kg)  Height: '6\' 2"'$  (1.88 m)     Physical  Exam Vitals reviewed.  Constitutional:      Appearance: He is well-developed.  HENT:     Head: Normocephalic and atraumatic.  Neck:     Vascular: No carotid bruit or JVD.  Cardiovascular:     Rate and Rhythm: Normal rate and regular rhythm.     Heart sounds: Normal heart sounds. No murmur heard. Pulmonary:     Effort: Pulmonary effort is normal.     Breath sounds: Normal breath sounds. No rales.  Musculoskeletal:     Right lower leg: No edema.     Left lower leg: No edema.  Skin:    General: Skin is warm and dry.     Comments: Few areas of cracked skin below the toes of bilateral feet but no surrounding erythema or swelling.  No discharge.  Slight macerated appearance, wet appearance between fourth and fifth toes bilaterally, minimal maceration between other toes.  No active discharge or surrounding erythema/swelling.  Neurological:     Mental Status: He is alert and oriented to person, place, and time.  Psychiatric:        Mood and Affect: Mood normal.        Assessment & Plan:  Babyboy Loya is a 50 y.o. male . Essential hypertension - Plan: Comprehensive metabolic panel, hydrochlorothiazide (HYDRODIURIL) 25 MG tablet  -  Stable, tolerating current regimen. Medications refilled. Labs pending as above.   Hyperlipidemia, unspecified hyperlipidemia type - Plan: atorvastatin (LIPITOR) 20 MG tablet,  Comprehensive metabolic panel, Lipid panel Leg cramps - Plan: Comprehensive metabolic panel  -Commended on weight loss, may be able to try lower dose statin to see if that is just as effective and to see if that lessens his muscle cramps.  Check electrolytes.  Recheck next few weeks.  Prediabetes - Plan: Hemoglobin A1c  -As above commended on weight loss.  Anticipate improvement in A1c, check labs.  Vitamin D deficiency - Plan: Vitamin D (25 hydroxy)  -Check labs off supplement to decide if repeat over-the-counter supplementation needed.  Tinea pedis of both feet Cracked skin on feet  -Appears to be tinea pedis between toes, start clotrimazole.  Cracks under toes could be component of eczema or dry skin, versus involvement from tinea.  Clotrimazole as above, Vaseline to any open cracks with RTC/ER/urgent care precautions of signs or symptoms of infection.  Recheck 2 weeks  Meds ordered this encounter  Medications   atorvastatin (LIPITOR) 20 MG tablet    Sig: Take 1 tablet (20 mg total) by mouth daily.    Dispense:  90 tablet    Refill:  3   losartan (COZAAR) 100 MG tablet    Sig: Take 1 tablet (100 mg total) by mouth daily.    Dispense:  90 tablet    Refill:  1   hydrochlorothiazide (HYDRODIURIL) 25 MG tablet    Sig: Take 1 tablet (25 mg total) by mouth daily.    Dispense:  90 tablet    Refill:  1   clotrimazole (LOTRIMIN) 1 % cream    Sig: Apply 1 application. topically 2 (two) times daily. Between toes, feet.    Dispense:  30 g    Refill:  0   Patient Instructions  Try lower dose atorvastatin. If cramps do not improve, follow up to look at other causes. Keep up the good work with diet and exercise and weight loss.  Return to the clinic or go to the nearest emergency room if any of your symptoms worsen or new symptoms occur.  Vaseline ok if dry/cracked skin. Antifungal cream between toes 2 time per day and recheck in 2 weeks. If any redness around cracks, swelling or discharge,  be seen right away.        Signed,   [name], MD [facility] 10/24/21 11:12 AM

## 2021-11-10 ENCOUNTER — Ambulatory Visit: Payer: 59 | Admitting: Family Medicine

## 2021-11-21 ENCOUNTER — Ambulatory Visit (INDEPENDENT_AMBULATORY_CARE_PROVIDER_SITE_OTHER): Payer: 59 | Admitting: Family Medicine

## 2021-11-21 ENCOUNTER — Encounter: Payer: Self-pay | Admitting: Family Medicine

## 2021-11-21 VITALS — BP 126/66 | HR 71 | Temp 98.2°F | Resp 16 | Ht 74.0 in | Wt 244.4 lb

## 2021-11-21 DIAGNOSIS — R252 Cramp and spasm: Secondary | ICD-10-CM | POA: Diagnosis not present

## 2021-11-21 DIAGNOSIS — E785 Hyperlipidemia, unspecified: Secondary | ICD-10-CM | POA: Diagnosis not present

## 2021-11-21 NOTE — Progress Notes (Signed)
Subjective:  Patient ID: Donald [name], male    DOB: 04-21-1971  Age: 51 y.o. MRN: [id number]  CC:  Chief Complaint  Patient presents with   leg cramps    Pt notes the cramps have lessened but that they have not resolved.     HPI Donald [name] presents for   Leg cramps Discussed at his May 22 visit.  Arms, leg cramps in his calves at times over the previous month. lower dose statin recommended to see if that lessen cramps.  Previously taking Lipitor 40 mg daily, decrease to 20 mg.  Some improvement but still some episodic cramps. Notices in the morning when waking, sometimes during the day. Off and on statin last visit.   History Patient Active Problem List   Diagnosis Date Noted   Chest pain 04/02/2021   Dyslipidemia, goal LDL below 130 08/25/2020   Encounter for general adult medical examination with abnormal findings 08/19/2020   Prediabetes 08/19/2020   Screen for colon cancer 08/19/2020   Diuretic-induced hypokalemia 08/19/2020   Chronic bilateral low back pain without sciatica 08/19/2020   Right bundle branch block 03/11/2010   Essential hypertension 05/14/2009   PARASOMNIA 05/14/2009   G E R D 05/13/2009   Past Medical History:  Diagnosis Date   Blood transfusion without reported diagnosis    DEPRESSION, MILD    Dysuria    G E R D    Hypercholesterolemia    Hyperlipidemia    NOT TAKING MEDICATION   Hypertension    Lumbago    NEPHROLITHIASIS, HX OF    PARASOMNIA    Right bundle branch block    Past Surgical History:  Procedure Laterality Date   CARDIAC CATHETERIZATION N/A 01/19/2016   Procedure: Left Heart Cath and Coronary Angiography;  Surgeon: Sherren Mocha, MD;  Location: [facility];  Service: Cardiovascular;  Laterality: N/A;   HAND SURGERY     right and left hand     Allergies  Allergen Reactions   Ace Inhibitors Cough    Lisinopril caused cough   Prior to Admission medications   Medication Sig Start Date End Date Taking?  Authorizing Provider  amLODipine (NORVASC) 10 MG tablet TAKE 1 TABLET BY MOUTH EVERY DAY 09/27/21  Yes [name], MD  aspirin 325 MG tablet Take 162.5 mg by mouth at bedtime.   Yes [provider]  atorvastatin (LIPITOR) 20 MG tablet Take 1 tablet (20 mg total) by mouth daily. 10/24/21  Yes [name], MD  clotrimazole (LOTRIMIN) 1 % cream Apply 1 application. topically 2 (two) times daily. Between toes, feet. 10/24/21  Yes [name], MD  fluticasone (FLONASE) 50 MCG/ACT nasal spray USE 1 SPRAY IN BOTH NOSTRILS DAILY Patient taking differently: Place 1 spray into both nostrils daily as needed for allergies. 03/27/20  Yes [name], MD  hydrochlorothiazide (HYDRODIURIL) 25 MG tablet Take 1 tablet (25 mg total) by mouth daily. 10/24/21  Yes [name], MD  losartan (COZAAR) 100 MG tablet Take 1 tablet (100 mg total) by mouth daily. 10/24/21  Yes [name], MD  Cholecalciferol (VITAMIN D3) 50 MCG (2000 UT) TABS Take one tablet by mouth daily. Patient not taking: Reported on 10/24/2021 04/25/21   [name], MD  omeprazole (PRILOSEC) 40 MG capsule Take 40 mg bid for 6 weeks to promote mucosal healing,then reduce to 40 mg daily and titrate to lowest effective dose,or off completely if GI Symptoms resolve. Patient not taking: Reported on  10/24/2021 01/24/21   CiriglianoDominic Pea, DO   Social History   Socioeconomic History   Marital status: Married    Spouse name: Not on file   Number of children: 6   Years of education: 15   Highest education level: Not on file  Occupational History   Occupation: Glass blower/designer    Employer: [employer]  Tobacco Use   Smoking status: Former    Types: Cigarettes   Smokeless tobacco: Never  Substance and Sexual Activity   Alcohol use: No    Alcohol/week: 0.0 standard drinks of alcohol   Drug use: No   Sexual activity: Not on file  Other Topics Concern   Not on file  Social History Narrative   **  Merged History Encounter **       Social Determinants of Health   Financial Resource Strain: Not on file  Food Insecurity: Not on file  Transportation Needs: Not on file  Physical Activity: Not on file  Stress: Not on file  Social Connections: Not on file  Intimate Partner Violence: Not on file    Review of Systems Per HPI.   Objective:   Vitals:   11/21/21 0901  BP: 126/66  Pulse: 71  Resp: 16  Temp: 98.2 F (36.8 C)  TempSrc: Temporal  SpO2: 97%  Weight: 244 lb 6.4 oz (110.9 kg)  Height: '6\' 2"'$  (1.88 m)     Physical Exam Constitutional:      General: He is not in acute distress.    Appearance: Normal appearance. He is well-developed.  HENT:     Head: Normocephalic and atraumatic.  Cardiovascular:     Rate and Rhythm: Normal rate.  Pulmonary:     Effort: Pulmonary effort is normal.  Musculoskeletal:     Comments: Calves, quads, leg mm nontender, soft. No bony ttp. No calf swelling, neg Homans bilaterally.   Neurological:     Mental Status: He is alert and oriented to person, place, and time.  Psychiatric:        Mood and Affect: Mood normal.     Assessment & Plan:  [name] is a 51 y.o. male . Hyperlipidemia, unspecified hyperlipidemia type  Leg cramps Slight improvement with the lower dose of atorvastatin.  Possible statin myopathy.  Stop atorvastatin completely for 2 weeks and if leg cramps resolve then could try different statin versus lipid clinic eval for PCSK9 inhibitor.  May try different statin initially.  Stretches discussed, maintenance of hydration discussed.  If no change in cramps off statin, then can evaluate for other causes.  6-week in office follow-up, with MyChart update on symptoms in the next few weeks.  RTC precautions if worsening.  No orders of the defined types were placed in this encounter.  Patient Instructions  Stop the atorvastatin for next 2 weeks. If the muscle cramps go away, then let me know and we can try  different med. If that is not tolerated, then may need to refer you to cholesterol specialist.  If the cramps do not change with stopping atorvastatin, also let me know so we can discuss other causes.  Make sure to drink plenty of fluids. Stretching of leg muscles at bedtime may help as well.   Return to the clinic or go to the nearest emergency room if any of your symptoms worsen or new symptoms occur.     Signed,   [name], MD [place], [facility] 11/21/21 9:52 AM

## 2021-11-21 NOTE — Patient Instructions (Addendum)
Stop the atorvastatin for next 2 weeks. If the muscle cramps go away, then let me know and we can try different med. If that is not tolerated, then may need to refer you to cholesterol specialist.  If the cramps do not change with stopping atorvastatin, also let me know so we can discuss other causes.  Make sure to drink plenty of fluids. Stretching of leg muscles at bedtime may help as well.   Return to the clinic or go to the nearest emergency room if any of your symptoms worsen or new symptoms occur.

## 2022-01-09 ENCOUNTER — Ambulatory Visit: Payer: 59 | Admitting: Family Medicine

## 2022-01-09 ENCOUNTER — Encounter: Payer: Self-pay | Admitting: Family Medicine

## 2022-01-09 VITALS — BP 118/76 | HR 64 | Temp 98.1°F | Resp 18 | Ht 74.0 in | Wt 242.0 lb

## 2022-01-09 DIAGNOSIS — R252 Cramp and spasm: Secondary | ICD-10-CM | POA: Diagnosis not present

## 2022-01-09 DIAGNOSIS — E785 Hyperlipidemia, unspecified: Secondary | ICD-10-CM

## 2022-01-09 MED ORDER — PRAVASTATIN SODIUM 20 MG PO TABS: 20.0000 mg | ORAL_TABLET | Freq: Every day | ORAL | 1 refills | Status: DC

## 2022-01-09 NOTE — Progress Notes (Signed)
Subjective:  Patient ID: Donald [name], male    DOB: 1970/12/18  Age: 51 y.o. MRN: [id number]  CC:  Chief Complaint  Patient presents with   Hyperlipidemia    Pt is fasting    HPI Donald [name] presents for   Hyperlipidemia: Lipitor 20 mg daily previously, he was having some leg cramps, or cramps when discussed in May.  Low-dose statin recommended, previously on Lipitor 40 mg, we decreased it to 20 mg.  He did have some improvement but episodic cramps at his June visit.  Episodic, more with walking.  2-week drug holiday recommended for his atorvastatin. 80% better with myalgias off statin. Off for over 3 weeks.  Lab Results  Component Value Date   CHOL 223 (H) 10/24/2021   HDL 51.50 10/24/2021   LDLCALC 159 (H) 10/24/2021   TRIG 63.0 10/24/2021   CHOLHDL 4 10/24/2021   Lab Results  Component Value Date   ALT 13 10/24/2021   AST 16 10/24/2021   ALKPHOS 46 10/24/2021   BILITOT 0.6 10/24/2021     History Patient Active Problem List   Diagnosis Date Noted   Chest pain 04/02/2021   Dyslipidemia, goal LDL below 130 08/25/2020   Encounter for general adult medical examination with abnormal findings 08/19/2020   Prediabetes 08/19/2020   Screen for colon cancer 08/19/2020   Diuretic-induced hypokalemia 08/19/2020   Chronic bilateral low back pain without sciatica 08/19/2020   Right bundle branch block 03/11/2010   Essential hypertension 05/14/2009   PARASOMNIA 05/14/2009   G E R D 05/13/2009   Past Medical History:  Diagnosis Date   Blood transfusion without reported diagnosis    DEPRESSION, MILD    Dysuria    G E R D    Hypercholesterolemia    Hyperlipidemia    NOT TAKING MEDICATION   Hypertension    Lumbago    NEPHROLITHIASIS, HX OF    PARASOMNIA    Right bundle branch block    Past Surgical History:  Procedure Laterality Date   CARDIAC CATHETERIZATION N/A 01/19/2016   Procedure: Left Heart Cath and Coronary Angiography;  Surgeon: Sherren Mocha,  MD;  Location: [facility];  Service: Cardiovascular;  Laterality: N/A;   HAND SURGERY     right and left hand     Allergies  Allergen Reactions   Ace Inhibitors Cough    Lisinopril caused cough   Prior to Admission medications   Medication Sig Start Date End Date Taking? Authorizing Provider  amLODipine (NORVASC) 10 MG tablet TAKE 1 TABLET BY MOUTH EVERY DAY 09/27/21  Yes [name], MD  aspirin 325 MG tablet Take 162.5 mg by mouth at bedtime.   Yes [provider]  atorvastatin (LIPITOR) 20 MG tablet Take 1 tablet (20 mg total) by mouth daily. 10/24/21  Yes [name], MD  Cholecalciferol (VITAMIN D3) 50 MCG (2000 UT) TABS Take one tablet by mouth daily. 04/25/21  Yes [name], MD  hydrochlorothiazide (HYDRODIURIL) 25 MG tablet Take 1 tablet (25 mg total) by mouth daily. 10/24/21  Yes [name], MD  losartan (COZAAR) 100 MG tablet Take 1 tablet (100 mg total) by mouth daily. 10/24/21  Yes [name], MD  clotrimazole (LOTRIMIN) 1 % cream Apply 1 application. topically 2 (two) times daily. Between toes, feet. Patient not taking: Reported on 01/09/2022 10/24/21   [name], MD  fluticasone [facility]) 50 MCG/ACT nasal spray USE 1 SPRAY IN BOTH NOSTRILS DAILY Patient taking differently:  Place 1 spray into both nostrils daily as needed for allergies. 03/27/20   [name], MD  omeprazole (PRILOSEC) 40 MG capsule Take 40 mg bid for 6 weeks to promote mucosal healing,then reduce to 40 mg daily and titrate to lowest effective dose,or off completely if GI Symptoms resolve. Patient not taking: Reported on 10/24/2021 01/24/21   Donald Bullion, DO   Social History   Socioeconomic History   Marital status: Married    Spouse name: Not on file   Number of children: 6   Years of education: 15   Highest education level: Not on file  Occupational History   Occupation: Glass blower/designer    Employer: [employer]  Tobacco Use    Smoking status: Former    Types: Cigarettes   Smokeless tobacco: Never  Substance and Sexual Activity   Alcohol use: No    Alcohol/week: 0.0 standard drinks of alcohol   Drug use: No   Sexual activity: Not on file  Other Topics Concern   Not on file  Social History Narrative   ** Merged History Encounter **       Social Determinants of Health   Financial Resource Strain: Not on file  Food Insecurity: Not on file  Transportation Needs: Not on file  Physical Activity: Not on file  Stress: Not on file  Social Connections: Not on file  Intimate Partner Violence: Not on file    Review of Systems Per HPI.   Objective:   Vitals:   01/09/22 0920  BP: 118/76  Pulse: 64  Resp: 18  Temp: 98.1 F (36.7 C)  SpO2: 96%  Weight: 242 lb (109.8 kg)  Height: '6\' 2"'$  (1.88 m)     Physical Exam Vitals reviewed.  Constitutional:      Appearance: He is well-developed.  HENT:     Head: Normocephalic and atraumatic.  Neck:     Vascular: No carotid bruit or JVD.  Cardiovascular:     Rate and Rhythm: Normal rate and regular rhythm.     Heart sounds: Normal heart sounds. No murmur heard. Pulmonary:     Effort: Pulmonary effort is normal.     Breath sounds: Normal breath sounds. No rales.  Musculoskeletal:     Right lower leg: No edema.     Left lower leg: No edema.  Skin:    General: Skin is warm and dry.  Neurological:     Mental Status: He is alert and oriented to person, place, and time.  Psychiatric:        Mood and Affect: Mood normal.      Assessment & Plan:  Tailor Westfall is a 51 y.o. male . Hyperlipidemia, unspecified hyperlipidemia type  Leg cramps Significantly improved after cessation of statin.  Will try different statin with pravastatin, initially intermittent dosing then increase to daily use if tolerated.  Also recommended coq.10 supplement.  Recheck in 1 month with fasting labs.  RTC precautions if new symptoms sooner.  If return of myalgias on the  statin, can stop and we can look at other options.  Meds ordered this encounter  Medications   pravastatin (PRAVACHOL) 20 MG tablet    Sig: Take 1 tablet (20 mg total) by mouth daily. Initially start 2 days per week, then increase to daily    Dispense:  90 tablet    Refill:  1   Patient Instructions  Try different statin - pravastatin 2 days per week initialy, then increase to daily use if  that is tolerated better. Try taking CoQ10 supplement over the counter as that may help prevent muscle aches. Recheck in 1 month - fasting labs at that visit.  Return to the clinic or go to the nearest emergency room if any of your symptoms worsen or new symptoms occur.     Signed,   [name], MD [place], [facility] 01/09/22 9:59 AM

## 2022-01-09 NOTE — Patient Instructions (Addendum)
Try different statin - pravastatin 2 days per week initialy, then increase to daily use if that is tolerated better. Try taking CoQ10 supplement over the counter as that may help prevent muscle aches. Recheck in 1 month - fasting labs at that visit.  Return to the clinic or go to the nearest emergency room if any of your symptoms worsen or new symptoms occur.

## 2022-03-29 ENCOUNTER — Other Ambulatory Visit: Payer: Self-pay | Admitting: Family Medicine

## 2022-03-29 DIAGNOSIS — I10 Essential (primary) hypertension: Secondary | ICD-10-CM

## 2022-04-17 ENCOUNTER — Encounter: Payer: Self-pay | Admitting: Family Medicine

## 2022-04-17 ENCOUNTER — Ambulatory Visit (INDEPENDENT_AMBULATORY_CARE_PROVIDER_SITE_OTHER): Payer: 59 | Admitting: Family Medicine

## 2022-04-17 VITALS — BP 138/78 | HR 64 | Temp 98.1°F | Ht 74.0 in | Wt 242.2 lb

## 2022-04-17 DIAGNOSIS — M545 Low back pain, unspecified: Secondary | ICD-10-CM | POA: Diagnosis not present

## 2022-04-17 DIAGNOSIS — R7303 Prediabetes: Secondary | ICD-10-CM | POA: Diagnosis not present

## 2022-04-17 DIAGNOSIS — I10 Essential (primary) hypertension: Secondary | ICD-10-CM

## 2022-04-17 DIAGNOSIS — R109 Unspecified abdominal pain: Secondary | ICD-10-CM | POA: Diagnosis not present

## 2022-04-17 DIAGNOSIS — E785 Hyperlipidemia, unspecified: Secondary | ICD-10-CM | POA: Diagnosis not present

## 2022-04-17 LAB — POCT URINALYSIS DIP (MANUAL ENTRY)
Bilirubin, UA: NEGATIVE
Blood, UA: NEGATIVE
Glucose, UA: NEGATIVE mg/dL
Ketones, POC UA: NEGATIVE mg/dL
Leukocytes, UA: NEGATIVE
Nitrite, UA: NEGATIVE
Protein Ur, POC: NEGATIVE mg/dL
Spec Grav, UA: 1.015 (ref 1.010–1.025)
Urobilinogen, UA: 0.2 E.U./dL
pH, UA: 7.5 (ref 5.0–8.0)

## 2022-04-17 LAB — HEMOGLOBIN A1C: Hgb A1c MFr Bld: 6.1 % (ref 4.6–6.5)

## 2022-04-17 MED ORDER — PRAVASTATIN SODIUM 20 MG PO TABS: 20.0000 mg | ORAL_TABLET | Freq: Every day | ORAL | 1 refills | Status: DC

## 2022-04-17 MED ORDER — HYDROCHLOROTHIAZIDE 25 MG PO TABS: 25.0000 mg | ORAL_TABLET | Freq: Every day | ORAL | 1 refills | Status: DC

## 2022-04-17 MED ORDER — LOSARTAN POTASSIUM 100 MG PO TABS: 100.0000 mg | ORAL_TABLET | Freq: Every day | ORAL | 1 refills | Status: DC

## 2022-04-17 MED ORDER — AMLODIPINE BESYLATE 10 MG PO TABS: 10.0000 mg | ORAL_TABLET | Freq: Every day | ORAL | 1 refills | Status: DC

## 2022-04-17 NOTE — Patient Instructions (Signed)
No medication changes today.  Tylenol is fine if needed for back pain.  See information below.  Once the back pain improves, blood pressure may also improve.  If you notice continued elevations in blood pressure or back pain is not improving in the next week or 2, please return for recheck.  Take care!   Acute Back Pain, Adult Acute back pain is sudden and usually short-lived. It is often caused by an injury to the muscles and tissues in the back. The injury may result from: A muscle, tendon, or ligament getting overstretched or torn. Ligaments are tissues that connect bones to each other. Lifting something improperly can cause a back strain. Wear and tear (degeneration) of the spinal disks. Spinal disks are circular tissue that provide cushioning between the bones of the spine (vertebrae). Twisting motions, such as while playing sports or doing yard work. A hit to the back. Arthritis. You may have a physical exam, lab tests, and imaging tests to find the cause of your pain. Acute back pain usually goes away with rest and home care. Follow these instructions at home: Managing pain, stiffness, and swelling Take over-the-counter and prescription medicines only as told by your health care provider. Treatment may include medicines for pain and inflammation that are taken by mouth or applied to the skin, or muscle relaxants. Your health care provider may recommend applying ice during the first 24-48 hours after your pain starts. To do this: Put ice in a plastic bag. Place a towel between your skin and the bag. Leave the ice on for 20 minutes, 2-3 times a day. Remove the ice if your skin turns bright red. This is very important. If you cannot feel pain, heat, or cold, you have a greater risk of damage to the area. If directed, apply heat to the affected area as often as told by your health care provider. Use the heat source that your health care provider recommends, such as a moist heat pack or a  heating pad. Place a towel between your skin and the heat source. Leave the heat on for 20-30 minutes. Remove the heat if your skin turns bright red. This is especially important if you are unable to feel pain, heat, or cold. You have a greater risk of getting burned. Activity  Do not stay in bed. Staying in bed for more than 1-2 days can delay your recovery. Sit up and stand up straight. Avoid leaning forward when you sit or hunching over when you stand. If you work at a desk, sit close to it so you do not need to lean over. Keep your chin tucked in. Keep your neck drawn back, and keep your elbows bent at a 90-degree angle (right angle). Sit high and close to the steering wheel when you drive. Add lower back (lumbar) support to your car seat, if needed. Take short walks on even surfaces as soon as you are able. Try to increase the length of time you walk each day. Do not sit, drive, or stand in one place for more than 30 minutes at a time. Sitting or standing for long periods of time can put stress on your back. Do not drive or use heavy machinery while taking prescription pain medicine. Use proper lifting techniques. When you bend and lift, use positions that put less stress on your back: [place] your knees. Keep the load close to your body. Avoid twisting. Exercise regularly as told by your health care provider. Exercising helps your back heal  faster and helps prevent back injuries by keeping muscles strong and flexible. Work with a physical therapist to make a safe exercise program, as recommended by your health care provider. Do any exercises as told by your physical therapist. Lifestyle Maintain a healthy weight. Extra weight puts stress on your back and makes it difficult to have good posture. Avoid activities or situations that make you feel anxious or stressed. Stress and anxiety increase muscle tension and can make back pain worse. Learn ways to manage anxiety and stress, such as through  exercise. General instructions Sleep on a firm mattress in a comfortable position. Try lying on your side with your knees slightly bent. If you lie on your back, put a pillow under your knees. Keep your head and neck in a straight line with your spine (neutral position) when using electronic equipment like smartphones or pads. To do this: Raise your smartphone or pad to look at it instead of bending your head or neck to look down. Put the smartphone or pad at the level of your face while looking at the screen. Follow your treatment plan as told by your health care provider. This may include: Cognitive or behavioral therapy. Acupuncture or massage therapy. Meditation or yoga. Contact a health care provider if: You have pain that is not relieved with rest or medicine. You have increasing pain going down into your legs or buttocks. Your pain does not improve after 2 weeks. You have pain at night. You lose weight without trying. You have a fever or chills. You develop nausea or vomiting. You develop abdominal pain. Get help right away if: You develop new bowel or bladder control problems. You have unusual weakness or numbness in your arms or legs. You feel faint. These symptoms may represent a serious problem that is an emergency. Do not wait to see if the symptoms will go away. Get medical help right away. Call your local emergency services (911 in the [place] Do not drive yourself to the hospital. Summary Acute back pain is sudden and usually short-lived. Use proper lifting techniques. When you bend and lift, use positions that put less stress on your back. Take over-the-counter and prescription medicines only as told by your health care provider, and apply heat or ice as told. This information is not intended to replace advice given to you by your health care provider. Make sure you discuss any questions you have with your health care provider. Document Revised: 08/13/2020 Document  Reviewed: 08/13/2020 Elsevier Patient Education  [place].

## 2022-04-17 NOTE — Progress Notes (Signed)
Subjective:  Patient ID: Donald [name], male    DOB: June 16, 1970  Age: 51 y.o. MRN: [id number]  CC:  Chief Complaint  Patient presents with   Hyperlipidemia   Hypertension    Pt states all is well    HPI Donald [name] presents for   Hypertension: Amlodipine 10 mg daily, HCTZ 25 mg daily, losartan 100 mg daily Home readings: 125/78. 135/80 at times.  .   BP Readings from Last 3 Encounters:  04/17/22 138/78  01/09/22 118/76  11/21/21 126/66   Lab Results  Component Value Date   CREATININE 0.82 10/24/2021    Hyperlipidemia: Lipitor 40 mg daily previously with myalgias, then 20 mg, still with some myalgias, improved with statin holiday.  Off statin for 3 weeks at his last visit in August.  Changed to pravastatin, intermittent dosing with co-Q10 supplement.  Has not had labs rechecked yet. Taking pravastatin daily - tolerating better without myalgias. Also taking Coq10. Doing well.  Fasting today.  Lab Results  Component Value Date   CHOL 223 (H) 10/24/2021   HDL 51.50 10/24/2021   LDLCALC 159 (H) 10/24/2021   TRIG 63.0 10/24/2021   CHOLHDL 4 10/24/2021   Lab Results  Component Value Date   ALT 13 10/24/2021   AST 16 10/24/2021   ALKPHOS 46 10/24/2021   BILITOT 0.6 10/24/2021   Low back pain.  Soreness in back past 2 weeks to sides. No bowel or bladder incontinence, no saddle anesthesia, no lower extremity weakness. No leg radiation. Remote hx nephrolithiasis. No recent hematuria, no urgency/dysuria/frequency - urinating normally. No rash. No new activity/exercise.   Prediabetes: No meds. Weight stable.  Lab Results  Component Value Date   HGBA1C 5.9 10/24/2021   Wt Readings from Last 3 Encounters:  04/17/22 242 lb 3.2 oz (109.9 kg)  01/09/22 242 lb (109.8 kg)  11/21/21 244 lb 6.4 oz (110.9 kg)     HM: flu vaccine today.  Declines shingrix at this time.  Covid booster - recommended at his pharmacy.   History Patient Active Problem List    Diagnosis Date Noted   Chest pain 04/02/2021   Dyslipidemia, goal LDL below 130 08/25/2020   Encounter for general adult medical examination with abnormal findings 08/19/2020   Prediabetes 08/19/2020   Screen for colon cancer 08/19/2020   Diuretic-induced hypokalemia 08/19/2020   Chronic bilateral low back pain without sciatica 08/19/2020   Right bundle branch block 03/11/2010   Essential hypertension 05/14/2009   PARASOMNIA 05/14/2009   G E R D 05/13/2009   Past Medical History:  Diagnosis Date   Blood transfusion without reported diagnosis    DEPRESSION, MILD    Dysuria    G E R D    Hypercholesterolemia    Hyperlipidemia    NOT TAKING MEDICATION   Hypertension    Lumbago    NEPHROLITHIASIS, HX OF    PARASOMNIA    Right bundle branch block    Past Surgical History:  Procedure Laterality Date   CARDIAC CATHETERIZATION N/A 01/19/2016   Procedure: Left Heart Cath and Coronary Angiography;  Surgeon: Sherren Mocha, MD;  Location: [facility];  Service: Cardiovascular;  Laterality: N/A;   HAND SURGERY     right and left hand     Allergies  Allergen Reactions   Ace Inhibitors Cough    Lisinopril caused cough   Prior to Admission medications   Medication Sig Start Date End Date Taking? Authorizing Provider  amLODipine (NORVASC) 10 MG tablet TAKE  1 TABLET BY MOUTH EVERY DAY 03/29/22  Yes [name], MD  aspirin 325 MG tablet Take 162.5 mg by mouth at bedtime.   Yes [provider]  atorvastatin (LIPITOR) 40 MG tablet Take 40 mg by mouth daily. 11/16/21  Yes [provider]  fluticasone (FLONASE) 50 MCG/ACT nasal spray USE 1 SPRAY IN BOTH NOSTRILS DAILY Patient taking differently: Place 1 spray into both nostrils daily as needed for allergies. 03/27/20  Yes [name], MD  hydrochlorothiazide (HYDRODIURIL) 25 MG tablet Take 1 tablet (25 mg total) by mouth daily. 10/24/21  Yes [name], MD  losartan (COZAAR) 100 MG tablet Take 1  tablet (100 mg total) by mouth daily. 10/24/21  Yes [name], MD  pravastatin (PRAVACHOL) 20 MG tablet Take 1 tablet (20 mg total) by mouth daily. Initially start 2 days per week, then increase to daily 01/09/22  Yes [name], MD  Cholecalciferol (VITAMIN D3) 50 MCG (2000 UT) TABS Take one tablet by mouth daily. Patient not taking: Reported on 04/17/2022 04/25/21   [name], MD  clotrimazole (LOTRIMIN) 1 % cream Apply 1 application. topically 2 (two) times daily. Between toes, feet. Patient not taking: Reported on 01/09/2022 10/24/21   [name], MD  omeprazole (PRILOSEC) 40 MG capsule Take 40 mg bid for 6 weeks to promote mucosal healing,then reduce to 40 mg daily and titrate to lowest effective dose,or off completely if GI Symptoms resolve. Patient not taking: Reported on 10/24/2021 01/24/21   Lavena Bullion, DO   Social History   Socioeconomic History   Marital status: Married    Spouse name: Not on file   Number of children: 6   Years of education: 15   Highest education level: Not on file  Occupational History   Occupation: Glass blower/designer    Employer: [employer]  Tobacco Use   Smoking status: Former    Types: Cigarettes   Smokeless tobacco: Never  Substance and Sexual Activity   Alcohol use: No    Alcohol/week: 0.0 standard drinks of alcohol   Drug use: No   Sexual activity: Not on file  Other Topics Concern   Not on file  Social History Narrative   ** Merged History Encounter **       Social Determinants of Health   Financial Resource Strain: Not on file  Food Insecurity: Not on file  Transportation Needs: Not on file  Physical Activity: Not on file  Stress: Not on file  Social Connections: Not on file  Intimate Partner Violence: Not on file    Review of Systems  Constitutional:  Negative for fatigue and unexpected weight change.  Eyes:  Negative for visual disturbance.  Respiratory:  Negative for cough, chest tightness and  shortness of breath.   Cardiovascular:  Negative for chest pain, palpitations and leg swelling.  Gastrointestinal:  Negative for abdominal pain and blood in stool.  Neurological:  Negative for dizziness, light-headedness and headaches.     Objective:   Vitals:   04/17/22 0835  BP: 138/78  Pulse: 64  Temp: 98.1 F (36.7 C)  SpO2: 99%  Weight: 242 lb 3.2 oz (109.9 kg)  Height: '6\' 2"'$  (1.88 m)     Physical Exam Vitals reviewed.  Constitutional:      Appearance: He is well-developed.  HENT:     Head: Normocephalic and atraumatic.  Neck:     Vascular: No carotid bruit or JVD.  Cardiovascular:  Rate and Rhythm: Normal rate and regular rhythm.     Heart sounds: Normal heart sounds. No murmur heard. Pulmonary:     Effort: Pulmonary effort is normal.     Breath sounds: Normal breath sounds. No rales.  Abdominal:     General: Abdomen is flat. There is no distension.     Tenderness: There is no abdominal tenderness. There is no right CVA tenderness or left CVA tenderness.  Musculoskeletal:     Right lower leg: No edema.     Left lower leg: No edema.     Comments: Pain-free range of motion of the lumbar spine, intact range of motion, negative seated straight leg raise.  On exam he does describe very discomfort of the paraspinals of the lower lumbar spine without midline bony tenderness.  Discomfort wraps around to his flanks but no focal tenderness on abdominal exam.  Skin:    General: Skin is warm and dry.  Neurological:     Mental Status: He is alert and oriented to person, place, and time.  Psychiatric:        Mood and Affect: Mood normal.    Results for orders placed or performed in visit on 04/17/22  POCT urinalysis dipstick  Result Value Ref Range   Color, UA yellow yellow   Clarity, UA clear clear   Glucose, UA negative negative mg/dL   Bilirubin, UA negative negative   Ketones, POC UA negative negative mg/dL   Spec Grav, UA 1.015 1.010 - 1.025   Blood, UA  negative negative   pH, UA 7.5 5.0 - 8.0   Protein Ur, POC negative negative mg/dL   Urobilinogen, UA 0.2 0.2 or 1.0 E.U./dL   Nitrite, UA Negative Negative   Leukocytes, UA Negative Negative       Assessment & Plan:  Donald [name] is a 51 y.o. male . Hyperlipidemia, unspecified hyperlipidemia type - Plan: losartan (COZAAR) 100 MG tablet  Tolerating pravastatin, continue same with co-Q10 supplement.  Check updated labs.  Essential hypertension - Plan: amLODipine (NORVASC) 10 MG tablet, hydrochlorothiazide (HYDRODIURIL) 25 MG tablet  -Previously well controlled, slight elevation recently, may be related to pain.  No med changes for now, RTC precautions if persistent elevations.  Prediabetes - Plan: Hemoglobin A1c Check labs, watch diet, exercise, med adjustment accordingly based on results.  Acute bilateral low back pain without sciatica - Plan: POCT urinalysis dipstick Flank pain - Plan: POCT urinalysis dipstick New concern.  Bilateral low back pain with radiation to bilateral flanks.  Check urinalysis but unlikely nephrolith or urinary infection.  Suspected mechanical low back pain with possible radiation to flank pain.  Abdominal exam reassuring..  No red flags on history or injury, reassuring exam, imaging deferred initially.  Symptomatic care discussed with Tylenol, handout given with RTC precautions.  Check urinalysis, but unlikely nephrolith.  Meds ordered this encounter  Medications   amLODipine (NORVASC) 10 MG tablet    Sig: Take 1 tablet (10 mg total) by mouth daily.    Dispense:  90 tablet    Refill:  1   hydrochlorothiazide (HYDRODIURIL) 25 MG tablet    Sig: Take 1 tablet (25 mg total) by mouth daily.    Dispense:  90 tablet    Refill:  1   losartan (COZAAR) 100 MG tablet    Sig: Take 1 tablet (100 mg total) by mouth daily.    Dispense:  90 tablet    Refill:  1   pravastatin (PRAVACHOL) 20 MG tablet  Sig: Take 1 tablet (20 mg total) by mouth daily. Initially  start 2 days per week, then increase to daily    Dispense:  90 tablet    Refill:  1   Patient Instructions  No medication changes today.  Tylenol is fine if needed for back pain.  See information below.  Once the back pain improves, blood pressure may also improve.  If you notice continued elevations in blood pressure or back pain is not improving in the next week or 2, please return for recheck.  Take care!   Acute Back Pain, Adult Acute back pain is sudden and usually short-lived. It is often caused by an injury to the muscles and tissues in the back. The injury may result from: A muscle, tendon, or ligament getting overstretched or torn. Ligaments are tissues that connect bones to each other. Lifting something improperly can cause a back strain. Wear and tear (degeneration) of the spinal disks. Spinal disks are circular tissue that provide cushioning between the bones of the spine (vertebrae). Twisting motions, such as while playing sports or doing yard work. A hit to the back. Arthritis. You may have a physical exam, lab tests, and imaging tests to find the cause of your pain. Acute back pain usually goes away with rest and home care. Follow these instructions at home: Managing pain, stiffness, and swelling Take over-the-counter and prescription medicines only as told by your health care provider. Treatment may include medicines for pain and inflammation that are taken by mouth or applied to the skin, or muscle relaxants. Your health care provider may recommend applying ice during the first 24-48 hours after your pain starts. To do this: Put ice in a plastic bag. Place a towel between your skin and the bag. Leave the ice on for 20 minutes, 2-3 times a day. Remove the ice if your skin turns bright red. This is very important. If you cannot feel pain, heat, or cold, you have a greater risk of damage to the area. If directed, apply heat to the affected area as often as told by your health  care provider. Use the heat source that your health care provider recommends, such as a moist heat pack or a heating pad. Place a towel between your skin and the heat source. Leave the heat on for 20-30 minutes. Remove the heat if your skin turns bright red. This is especially important if you are unable to feel pain, heat, or cold. You have a greater risk of getting burned. Activity  Do not stay in bed. Staying in bed for more than 1-2 days can delay your recovery. Sit up and stand up straight. Avoid leaning forward when you sit or hunching over when you stand. If you work at a desk, sit close to it so you do not need to lean over. Keep your chin tucked in. Keep your neck drawn back, and keep your elbows bent at a 90-degree angle (right angle). Sit high and close to the steering wheel when you drive. Add lower back (lumbar) support to your car seat, if needed. Take short walks on even surfaces as soon as you are able. Try to increase the length of time you walk each day. Do not sit, drive, or stand in one place for more than 30 minutes at a time. Sitting or standing for long periods of time can put stress on your back. Do not drive or use heavy machinery while taking prescription pain medicine. Use proper lifting techniques. When  you bend and lift, use positions that put less stress on your back: [place] your knees. Keep the load close to your body. Avoid twisting. Exercise regularly as told by your health care provider. Exercising helps your back heal faster and helps prevent back injuries by keeping muscles strong and flexible. Work with a physical therapist to make a safe exercise program, as recommended by your health care provider. Do any exercises as told by your physical therapist. Lifestyle Maintain a healthy weight. Extra weight puts stress on your back and makes it difficult to have good posture. Avoid activities or situations that make you feel anxious or stressed. Stress and anxiety  increase muscle tension and can make back pain worse. Learn ways to manage anxiety and stress, such as through exercise. General instructions Sleep on a firm mattress in a comfortable position. Try lying on your side with your knees slightly bent. If you lie on your back, put a pillow under your knees. Keep your head and neck in a straight line with your spine (neutral position) when using electronic equipment like smartphones or pads. To do this: Raise your smartphone or pad to look at it instead of bending your head or neck to look down. Put the smartphone or pad at the level of your face while looking at the screen. Follow your treatment plan as told by your health care provider. This may include: Cognitive or behavioral therapy. Acupuncture or massage therapy. Meditation or yoga. Contact a health care provider if: You have pain that is not relieved with rest or medicine. You have increasing pain going down into your legs or buttocks. Your pain does not improve after 2 weeks. You have pain at night. You lose weight without trying. You have a fever or chills. You develop nausea or vomiting. You develop abdominal pain. Get help right away if: You develop new bowel or bladder control problems. You have unusual weakness or numbness in your arms or legs. You feel faint. These symptoms may represent a serious problem that is an emergency. Do not wait to see if the symptoms will go away. Get medical help right away. Call your local emergency services (911 in the [place] Do not drive yourself to the hospital. Summary Acute back pain is sudden and usually short-lived. Use proper lifting techniques. When you bend and lift, use positions that put less stress on your back. Take over-the-counter and prescription medicines only as told by your health care provider, and apply heat or ice as told. This information is not intended to replace advice given to you by your health care provider. Make sure  you discuss any questions you have with your health care provider. Document Revised: 08/13/2020 Document Reviewed: 08/13/2020 Elsevier Patient Education  [place],   [name], MD [place], [facility] 04/17/22 9:43 AM

## 2022-05-01 ENCOUNTER — Encounter: Payer: Self-pay | Admitting: Family Medicine

## 2022-05-01 ENCOUNTER — Ambulatory Visit: Payer: 59 | Admitting: Family Medicine

## 2022-05-01 VITALS — BP 118/60 | HR 61 | Temp 98.1°F | Ht 74.0 in | Wt 243.2 lb

## 2022-05-01 DIAGNOSIS — I10 Essential (primary) hypertension: Secondary | ICD-10-CM

## 2022-05-01 DIAGNOSIS — M545 Low back pain, unspecified: Secondary | ICD-10-CM

## 2022-05-01 DIAGNOSIS — R102 Pelvic and perineal pain: Secondary | ICD-10-CM

## 2022-05-01 DIAGNOSIS — E785 Hyperlipidemia, unspecified: Secondary | ICD-10-CM

## 2022-05-01 LAB — POCT URINALYSIS DIP (MANUAL ENTRY)
Bilirubin, UA: NEGATIVE
Blood, UA: NEGATIVE
Glucose, UA: NEGATIVE mg/dL
Ketones, POC UA: NEGATIVE mg/dL
Leukocytes, UA: NEGATIVE
Nitrite, UA: NEGATIVE
Protein Ur, POC: NEGATIVE mg/dL
Spec Grav, UA: 1.015 (ref 1.010–1.025)
Urobilinogen, UA: 0.2 E.U./dL
pH, UA: 7 (ref 5.0–8.0)

## 2022-05-01 NOTE — Patient Instructions (Addendum)
I will check the urine test again but that looked okay last visit.  I am also checking a prostate test and some other blood work including the monitoring labs for your blood pressure and cholesterol.  Please have x-ray performed at the [facility] location.  Tylenol is fine for now.  See other information below regarding back pain.  Depending on results of tests may need to have CT scan next but will let you know once I review those results.  Return to the clinic or go to the nearest emergency room if any of your symptoms worsen or new symptoms occur.  [facility] for xray.  Walk in 8:30-4:30 during weekdays, no appointment needed [place].  [place], [place] [address]    Acute Back Pain, Adult Acute back pain is sudden and usually short-lived. It is often caused by an injury to the muscles and tissues in the back. The injury may result from: A muscle, tendon, or ligament getting overstretched or torn. Ligaments are tissues that connect bones to each other. Lifting something improperly can cause a back strain. Wear and tear (degeneration) of the spinal disks. Spinal disks are circular tissue that provide cushioning between the bones of the spine (vertebrae). Twisting motions, such as while playing sports or doing yard work. A hit to the back. Arthritis. You may have a physical exam, lab tests, and imaging tests to find the cause of your pain. Acute back pain usually goes away with rest and home care. Follow these instructions at home: Managing pain, stiffness, and swelling Take over-the-counter and prescription medicines only as told by your health care provider. Treatment may include medicines for pain and inflammation that are taken by mouth or applied to the skin, or muscle relaxants. Your health care provider may recommend applying ice during the first 24-48 hours after your pain starts. To do this: Put ice in a plastic bag. Place a towel between your skin and the bag. Leave the ice on  for 20 minutes, 2-3 times a day. Remove the ice if your skin turns bright red. This is very important. If you cannot feel pain, heat, or cold, you have a greater risk of damage to the area. If directed, apply heat to the affected area as often as told by your health care provider. Use the heat source that your health care provider recommends, such as a moist heat pack or a heating pad. Place a towel between your skin and the heat source. Leave the heat on for 20-30 minutes. Remove the heat if your skin turns bright red. This is especially important if you are unable to feel pain, heat, or cold. You have a greater risk of getting burned. Activity  Do not stay in bed. Staying in bed for more than 1-2 days can delay your recovery. Sit up and stand up straight. Avoid leaning forward when you sit or hunching over when you stand. If you work at a desk, sit close to it so you do not need to lean over. Keep your chin tucked in. Keep your neck drawn back, and keep your elbows bent at a 90-degree angle (right angle). Sit high and close to the steering wheel when you drive. Add lower back (lumbar) support to your car seat, if needed. Take short walks on even surfaces as soon as you are able. Try to increase the length of time you walk each day. Do not sit, drive, or stand in one place for more than 30 minutes at  a time. Sitting or standing for long periods of time can put stress on your back. Do not drive or use heavy machinery while taking prescription pain medicine. Use proper lifting techniques. When you bend and lift, use positions that put less stress on your back: [place] your knees. Keep the load close to your body. Avoid twisting. Exercise regularly as told by your health care provider. Exercising helps your back heal faster and helps prevent back injuries by keeping muscles strong and flexible. Work with a physical therapist to make a safe exercise program, as recommended by your health care provider.  Do any exercises as told by your physical therapist. Lifestyle Maintain a healthy weight. Extra weight puts stress on your back and makes it difficult to have good posture. Avoid activities or situations that make you feel anxious or stressed. Stress and anxiety increase muscle tension and can make back pain worse. Learn ways to manage anxiety and stress, such as through exercise. General instructions Sleep on a firm mattress in a comfortable position. Try lying on your side with your knees slightly bent. If you lie on your back, put a pillow under your knees. Keep your head and neck in a straight line with your spine (neutral position) when using electronic equipment like smartphones or pads. To do this: Raise your smartphone or pad to look at it instead of bending your head or neck to look down. Put the smartphone or pad at the level of your face while looking at the screen. Follow your treatment plan as told by your health care provider. This may include: Cognitive or behavioral therapy. Acupuncture or massage therapy. Meditation or yoga. Contact a health care provider if: You have pain that is not relieved with rest or medicine. You have increasing pain going down into your legs or buttocks. Your pain does not improve after 2 weeks. You have pain at night. You lose weight without trying. You have a fever or chills. You develop nausea or vomiting. You develop abdominal pain. Get help right away if: You develop new bowel or bladder control problems. You have unusual weakness or numbness in your arms or legs. You feel faint. These symptoms may represent a serious problem that is an emergency. Do not wait to see if the symptoms will go away. Get medical help right away. Call your local emergency services (911 in the [place] Do not drive yourself to the hospital. Summary Acute back pain is sudden and usually short-lived. Use proper lifting techniques. When you bend and lift, use positions  that put less stress on your back. Take over-the-counter and prescription medicines only as told by your health care provider, and apply heat or ice as told. This information is not intended to replace advice given to you by your health care provider. Make sure you discuss any questions you have with your health care provider. Document Revised: 08/13/2020 Document Reviewed: 08/13/2020 Elsevier Patient Education  [place].

## 2022-05-01 NOTE — Progress Notes (Signed)
Subjective:  Patient ID: Donald [name], male    DOB: [birth year]  Age: 51 y.o. MRN: [id number]  CC:  Chief Complaint  Patient presents with   Hyperlipidemia   Hypertension    Pt states all is well    HPI Donald [name] presents for  Follow-up from November 13 office visit. Hypertension and hyperlipidemia were discussed at that time.  Blood pressure was slightly elevated, thought to be related to pain.  Prediabetes test was overall stable with A1c of 6.1.    Low back pain, flank pain New concern at his November 13 visit.  Present for 2 weeks at that time.  Bilateral low back pain, with radiation to bilateral flanks, suspected mechanical low back pain.  Abdominal exam is reassuring, no red flags on history or injury, imaging was initially deferred.  Urinalysis was negative, including no blood.  Unlikely nephrolith.  Recommended Tylenol, symptomatic care.  Has been taking tylenol - 3 times per day. Still having pain in lower back, both sides, flanks and into lower abdomen.  Feels worse overall. Some less, some more, but there all the time/  Eating/drinking ok.  No abd distension.  Normal stools - no diarrhea, BM QD, no hard stools.  No n/v/fever.  Sometimes sore to urinate at times -in bladder area. , no frequency/urgency. no hematuria.  Home BP 135-138/78.   BP Readings from Last 3 Encounters:  05/01/22 118/60  04/17/22 138/78  01/09/22 118/76     History Patient Active Problem List   Diagnosis Date Noted   Chest pain 04/02/2021   Dyslipidemia, goal LDL below 130 08/25/2020   Encounter for general adult medical examination with abnormal findings 08/19/2020   Prediabetes 08/19/2020   Screen for colon cancer 08/19/2020   Diuretic-induced hypokalemia 08/19/2020   Chronic bilateral low back pain without sciatica 08/19/2020   Right bundle branch block 03/11/2010   Essential hypertension 05/14/2009   PARASOMNIA 05/14/2009   G E R D 05/13/2009   Past Medical History:   Diagnosis Date   Blood transfusion without reported diagnosis    DEPRESSION, MILD    Dysuria    G E R D    Hypercholesterolemia    Hyperlipidemia    NOT TAKING MEDICATION   Hypertension    Lumbago    NEPHROLITHIASIS, HX OF    PARASOMNIA    Right bundle branch block    Past Surgical History:  Procedure Laterality Date   CARDIAC CATHETERIZATION N/A 01/19/2016   Procedure: Left Heart Cath and Coronary Angiography;  Surgeon: Sherren Mocha, MD;  Location: [facility];  Service: Cardiovascular;  Laterality: N/A;   HAND SURGERY     right and left hand     Allergies  Allergen Reactions   Ace Inhibitors Cough    Lisinopril caused cough   Prior to Admission medications   Medication Sig Start Date End Date Taking? Authorizing Provider  amLODipine (NORVASC) 10 MG tablet Take 1 tablet (10 mg total) by mouth daily. 04/17/22  Yes [name], MD  aspirin 325 MG tablet Take 162.5 mg by mouth at bedtime.   Yes [provider]  hydrochlorothiazide (HYDRODIURIL) 25 MG tablet Take 1 tablet (25 mg total) by mouth daily. 04/17/22  Yes [name], MD  losartan (COZAAR) 100 MG tablet Take 1 tablet (100 mg total) by mouth daily. 04/17/22  Yes [name], MD  pravastatin (PRAVACHOL) 20 MG tablet Take 1 tablet (20 mg total) by mouth daily. Initially start 2 days  per week, then increase to daily 04/17/22  Yes [name], MD  Cholecalciferol (VITAMIN D3) 50 MCG (2000 UT) TABS Take one tablet by mouth daily. Patient not taking: Reported on 04/17/2022 04/25/21   [name], MD  clotrimazole (LOTRIMIN) 1 % cream Apply 1 application. topically 2 (two) times daily. Between toes, feet. Patient not taking: Reported on 01/09/2022 10/24/21   [name], MD  fluticasone [facility]) 50 MCG/ACT nasal spray USE 1 SPRAY IN BOTH NOSTRILS DAILY Patient not taking: Reported on 05/01/2022 03/27/20   [name], MD  omeprazole (PRILOSEC) 40 MG capsule Take 40 mg  bid for 6 weeks to promote mucosal healing,then reduce to 40 mg daily and titrate to lowest effective dose,or off completely if GI Symptoms resolve. Patient not taking: Reported on 10/24/2021 01/24/21   Lavena Bullion, DO   Social History   Socioeconomic History   Marital status: Married    Spouse name: Not on file   Number of children: 6   Years of education: 15   Highest education level: Not on file  Occupational History   Occupation: Glass blower/designer    Employer: [employer]  Tobacco Use   Smoking status: Former    Types: Cigarettes   Smokeless tobacco: Never  Substance and Sexual Activity   Alcohol use: No    Alcohol/week: 0.0 standard drinks of alcohol   Drug use: No   Sexual activity: Not on file  Other Topics Concern   Not on file  Social History Narrative   ** Merged History Encounter **       Social Determinants of Health   Financial Resource Strain: Not on file  Food Insecurity: Not on file  Transportation Needs: Not on file  Physical Activity: Not on file  Stress: Not on file  Social Connections: Not on file  Intimate Partner Violence: Not on file    Review of Systems  Constitutional:  Negative for fatigue and unexpected weight change.  Eyes:  Negative for visual disturbance.  Respiratory:  Negative for cough, chest tightness and shortness of breath.   Cardiovascular:  Negative for chest pain, palpitations and leg swelling.  Gastrointestinal:  Negative for abdominal pain and blood in stool.  Genitourinary:  Negative for difficulty urinating, dysuria (Pain in lower abdomen with urination, no penile discomfort.), frequency and hematuria.  Neurological:  Negative for dizziness, light-headedness and headaches.    Objective:   Vitals:   05/01/22 1053  BP: 118/60  Pulse: 61  Temp: 98.1 F (36.7 C)  SpO2: 100%  Weight: 243 lb 3.2 oz (110.3 kg)  Height: '6\' 2"'$  (1.88 m)   Physical Exam Vitals reviewed.  Constitutional:      Appearance: He is  well-developed.  HENT:     Head: Normocephalic and atraumatic.  Neck:     Vascular: No carotid bruit or JVD.  Cardiovascular:     Rate and Rhythm: Normal rate and regular rhythm.     Heart sounds: Normal heart sounds. No murmur heard. Pulmonary:     Effort: Pulmonary effort is normal.     Breath sounds: Normal breath sounds. No rales.  Abdominal:     General: There is no distension.     Tenderness: There is abdominal tenderness (Minimal suprapubic.  No rebound/guarding.). There is no right CVA tenderness, left CVA tenderness, guarding or rebound.  Musculoskeletal:     Right lower leg: No edema.     Left lower leg: No edema.  Comments: No midline bony tenderness but some discomfort of the paraspinal musculature lower lumbar spine bilaterally.  Ambulating without difficulty or assistive device.  Skin:    General: Skin is warm and dry.  Neurological:     Mental Status: He is alert and oriented to person, place, and time.  Psychiatric:        Mood and Affect: Mood normal.    Results for orders placed or performed in visit on 05/01/22  POCT urinalysis dipstick  Result Value Ref Range   Color, UA yellow yellow   Clarity, UA clear clear   Glucose, UA negative negative mg/dL   Bilirubin, UA negative negative   Ketones, POC UA negative negative mg/dL   Spec Grav, UA 1.015 1.010 - 1.025   Blood, UA negative negative   pH, UA 7.0 5.0 - 8.0   Protein Ur, POC negative negative mg/dL   Urobilinogen, UA 0.2 0.2 or 1.0 E.U./dL   Nitrite, UA Negative Negative   Leukocytes, UA Negative Negative   Assessment & Plan:  Donald [name] is a 51 y.o. male . Acute bilateral low back pain without sciatica - Plan: Comprehensive metabolic panel, CBC, DG Lumbar Spine Complete Suprapubic discomfort - Plan: PSA, POCT urinalysis dipstick  -Persistent low back pain radiating to flanks, lower abdomen.  Occasional discomfort with urination but appears to be more lower abdominal, primary urinalysis  reassuring.  Suspected radiation of lumbar spine pain, and with continued symptoms will check imaging as above.  With lower abdominal pain check CBC, CMP, repeat urinalysis, PSA.  Depending on results consider CT imaging.  Handout given on back pain with RTC precautions.  Hyperlipidemia, unspecified hyperlipidemia type - Plan: Comprehensive metabolic panel, Lipid panel  -See last visit, tolerating meds, updated labs ordered.  Essential hypertension - Plan: Comprehensive metabolic panel  -Borderline blood pressures previously and at home, in office reading reassuring.  Likely slight elevation in BP with pain.  No change in meds for now.  Check labs as above.   No orders of the defined types were placed in this encounter.  Patient Instructions  I will check the urine test again but that looked okay last visit.  I am also checking a prostate test and some other blood work including the monitoring labs for your blood pressure and cholesterol.  Please have x-ray performed at the [facility] location.  Tylenol is fine for now.  See other information below regarding back pain.  Depending on results of tests may need to have CT scan next but will let you know once I review those results.  Return to the clinic or go to the nearest emergency room if any of your symptoms worsen or new symptoms occur.  [facility] [facility] for xray.  Walk in 8:30-4:30 during weekdays, no appointment needed [place].  [place], [place] [id number]    Acute Back Pain, Adult Acute back pain is sudden and usually short-lived. It is often caused by an injury to the muscles and tissues in the back. The injury may result from: A muscle, tendon, or ligament getting overstretched or torn. Ligaments are tissues that connect bones to each other. Lifting something improperly can cause a back strain. Wear and tear (degeneration) of the spinal disks. Spinal disks are circular tissue that provide cushioning between the bones of the spine  (vertebrae). Twisting motions, such as while playing sports or doing yard work. A hit to the back. Arthritis. You may have a physical exam, lab tests, and imaging tests to  find the cause of your pain. Acute back pain usually goes away with rest and home care. Follow these instructions at home: Managing pain, stiffness, and swelling Take over-the-counter and prescription medicines only as told by your health care provider. Treatment may include medicines for pain and inflammation that are taken by mouth or applied to the skin, or muscle relaxants. Your health care provider may recommend applying ice during the first 24-48 hours after your pain starts. To do this: Put ice in a plastic bag. Place a towel between your skin and the bag. Leave the ice on for 20 minutes, 2-3 times a day. Remove the ice if your skin turns bright red. This is very important. If you cannot feel pain, heat, or cold, you have a greater risk of damage to the area. If directed, apply heat to the affected area as often as told by your health care provider. Use the heat source that your health care provider recommends, such as a moist heat pack or a heating pad. Place a towel between your skin and the heat source. Leave the heat on for 20-30 minutes. Remove the heat if your skin turns bright red. This is especially important if you are unable to feel pain, heat, or cold. You have a greater risk of getting burned. Activity  Do not stay in bed. Staying in bed for more than 1-2 days can delay your recovery. Sit up and stand up straight. Avoid leaning forward when you sit or hunching over when you stand. If you work at a desk, sit close to it so you do not need to lean over. Keep your chin tucked in. Keep your neck drawn back, and keep your elbows bent at a 90-degree angle (right angle). Sit high and close to the steering wheel when you drive. Add lower back (lumbar) support to your car seat, if needed. Take short walks on even  surfaces as soon as you are able. Try to increase the length of time you walk each day. Do not sit, drive, or stand in one place for more than 30 minutes at a time. Sitting or standing for long periods of time can put stress on your back. Do not drive or use heavy machinery while taking prescription pain medicine. Use proper lifting techniques. When you bend and lift, use positions that put less stress on your back: [place] your knees. Keep the load close to your body. Avoid twisting. Exercise regularly as told by your health care provider. Exercising helps your back heal faster and helps prevent back injuries by keeping muscles strong and flexible. Work with a physical therapist to make a safe exercise program, as recommended by your health care provider. Do any exercises as told by your physical therapist. Lifestyle Maintain a healthy weight. Extra weight puts stress on your back and makes it difficult to have good posture. Avoid activities or situations that make you feel anxious or stressed. Stress and anxiety increase muscle tension and can make back pain worse. Learn ways to manage anxiety and stress, such as through exercise. General instructions Sleep on a firm mattress in a comfortable position. Try lying on your side with your knees slightly bent. If you lie on your back, put a pillow under your knees. Keep your head and neck in a straight line with your spine (neutral position) when using electronic equipment like smartphones or pads. To do this: Raise your smartphone or pad to look at it instead of bending your head or neck to  look down. Put the smartphone or pad at the level of your face while looking at the screen. Follow your treatment plan as told by your health care provider. This may include: Cognitive or behavioral therapy. Acupuncture or massage therapy. Meditation or yoga. Contact a health care provider if: You have pain that is not relieved with rest or medicine. You have  increasing pain going down into your legs or buttocks. Your pain does not improve after 2 weeks. You have pain at night. You lose weight without trying. You have a fever or chills. You develop nausea or vomiting. You develop abdominal pain. Get help right away if: You develop new bowel or bladder control problems. You have unusual weakness or numbness in your arms or legs. You feel faint. These symptoms may represent a serious problem that is an emergency. Do not wait to see if the symptoms will go away. Get medical help right away. Call your local emergency services (911 in the [place] Do not drive yourself to the hospital. Summary Acute back pain is sudden and usually short-lived. Use proper lifting techniques. When you bend and lift, use positions that put less stress on your back. Take over-the-counter and prescription medicines only as told by your health care provider, and apply heat or ice as told. This information is not intended to replace advice given to you by your health care provider. Make sure you discuss any questions you have with your health care provider. Document Revised: 08/13/2020 Document Reviewed: 08/13/2020 Elsevier Patient Education  [place],   [name], MD [place], [facility] 05/01/22 11:31 AM

## 2022-05-02 LAB — COMPREHENSIVE METABOLIC PANEL
ALT: 13 U/L (ref 0–53)
AST: 15 U/L (ref 0–37)
Albumin: 4.5 g/dL (ref 3.5–5.2)
Alkaline Phosphatase: 52 U/L (ref 39–117)
BUN: 15 mg/dL (ref 6–23)
CO2: 30 mEq/L (ref 19–32)
Calcium: 9.4 mg/dL (ref 8.4–10.5)
Chloride: 101 mEq/L (ref 96–112)
Creatinine, Ser: 0.85 mg/dL (ref 0.40–1.50)
GFR: 98.98 mL/min (ref >60.00)
Glucose, Bld: 82 mg/dL (ref 70–99)
Potassium: 4.2 mEq/L (ref 3.5–5.1)
Sodium: 139 mEq/L (ref 135–145)
Total Bilirubin: 0.6 mg/dL (ref 0.2–1.2)
Total Protein: 8 g/dL (ref 6.0–8.3)

## 2022-05-02 LAB — LIPID PANEL
Cholesterol: 190 mg/dL (ref 0–200)
HDL: 49 mg/dL (ref >39.00)
LDL Cholesterol: 126 mg/dL — ABNORMAL HIGH (ref 0–99)
NonHDL: 141.33
Total CHOL/HDL Ratio: 4
Triglycerides: 76 mg/dL (ref 0.0–149.0)
VLDL: 15.2 mg/dL (ref 0.0–40.0)

## 2022-05-02 LAB — CBC
HCT: 43.3 % (ref 39.0–52.0)
Hemoglobin: 14.6 g/dL (ref 13.0–17.0)
MCHC: 33.7 g/dL (ref 30.0–36.0)
MCV: 85.1 fl (ref 78.0–100.0)
Platelets: 336 10*3/uL (ref 150.0–400.0)
RBC: 5.09 Mil/uL (ref 4.22–5.81)
RDW: 14.7 % (ref 11.5–15.5)
WBC: 5.2 10*3/uL (ref 4.0–10.5)

## 2022-05-02 LAB — PSA: PSA: 0.27 ng/mL (ref 0.10–4.00)

## 2022-06-03 ENCOUNTER — Emergency Department (HOSPITAL_COMMUNITY): Payer: 59

## 2022-06-03 ENCOUNTER — Encounter (HOSPITAL_COMMUNITY): Payer: Self-pay

## 2022-06-03 ENCOUNTER — Observation Stay (HOSPITAL_COMMUNITY)
Admission: EM | Admit: 2022-06-03 | Discharge: 2022-06-04 | Disposition: A | Discharge status: Home / Self Care | Payer: 59 | Attending: Internal Medicine | Admitting: Internal Medicine

## 2022-06-03 DIAGNOSIS — Z7982 Long term (current) use of aspirin: Secondary | ICD-10-CM | POA: Diagnosis not present

## 2022-06-03 DIAGNOSIS — I10 Essential (primary) hypertension: Secondary | ICD-10-CM | POA: Diagnosis present

## 2022-06-03 DIAGNOSIS — Z23 Encounter for immunization: Secondary | ICD-10-CM | POA: Diagnosis not present

## 2022-06-03 DIAGNOSIS — R079 Chest pain, unspecified: Secondary | ICD-10-CM | POA: Diagnosis not present

## 2022-06-03 DIAGNOSIS — R002 Palpitations: Secondary | ICD-10-CM

## 2022-06-03 DIAGNOSIS — Z87891 Personal history of nicotine dependence: Secondary | ICD-10-CM | POA: Diagnosis not present

## 2022-06-03 DIAGNOSIS — Z79899 Other long term (current) drug therapy: Secondary | ICD-10-CM | POA: Insufficient documentation

## 2022-06-03 DIAGNOSIS — R0789 Other chest pain: Secondary | ICD-10-CM | POA: Diagnosis present

## 2022-06-03 DIAGNOSIS — E785 Hyperlipidemia, unspecified: Secondary | ICD-10-CM | POA: Diagnosis present

## 2022-06-03 DIAGNOSIS — I4891 Unspecified atrial fibrillation: Principal | ICD-10-CM | POA: Diagnosis present

## 2022-06-03 DIAGNOSIS — E782 Mixed hyperlipidemia: Secondary | ICD-10-CM

## 2022-06-03 LAB — CBC
HCT: 41.6 % (ref 39.0–52.0)
Hemoglobin: 13.7 g/dL (ref 13.0–17.0)
MCH: 28.5 pg (ref 26.0–34.0)
MCHC: 32.9 g/dL (ref 30.0–36.0)
MCV: 86.5 fL (ref 80.0–100.0)
Platelets: 272 10*3/uL (ref 150–400)
RBC: 4.81 MIL/uL (ref 4.22–5.81)
RDW: 14.3 % (ref 11.5–15.5)
WBC: 6.1 10*3/uL (ref 4.0–10.5)
nRBC: 0 % (ref 0.0–0.2)

## 2022-06-03 LAB — BASIC METABOLIC PANEL
Anion gap: 11 (ref 5–15)
BUN: 16 mg/dL (ref 6–20)
CO2: 21 mmol/L — ABNORMAL LOW (ref 22–32)
Calcium: 8.8 mg/dL — ABNORMAL LOW (ref 8.9–10.3)
Chloride: 106 mmol/L (ref 98–111)
Creatinine, Ser: 0.99 mg/dL (ref 0.61–1.24)
GFR, Estimated: >60 mL/min (ref >60)
Glucose, Bld: 108 mg/dL — ABNORMAL HIGH (ref 70–99)
Potassium: 3.6 mmol/L (ref 3.5–5.1)
Sodium: 138 mmol/L (ref 135–145)

## 2022-06-03 LAB — TROPONIN I (HIGH SENSITIVITY): Troponin I (High Sensitivity): 4 ng/L (ref <18)

## 2022-06-03 MED ORDER — HEPARIN (PORCINE) 25000 UT/250ML-% IV SOLN
1400.0000 [IU]/h | INTRAVENOUS | Status: DC
  Administered 2022-06-03: 1400 [IU]/h via INTRAVENOUS
  Filled 2022-06-03: qty 250

## 2022-06-03 MED ORDER — ACETAMINOPHEN 650 MG RE SUPP: 650.0000 mg | Freq: Four times a day (QID) | RECTAL | Status: DC | PRN

## 2022-06-03 MED ORDER — MELATONIN 3 MG PO TABS: 3.0000 mg | ORAL_TABLET | Freq: Every evening | ORAL | Status: DC | PRN

## 2022-06-03 MED ORDER — DILTIAZEM HCL-DEXTROSE 125-5 MG/125ML-% IV SOLN (PREMIX)
5.0000 mg/h | INTRAVENOUS | Status: DC
  Administered 2022-06-03: 5 mg/h via INTRAVENOUS
  Filled 2022-06-03 (×2): qty 125

## 2022-06-03 MED ORDER — DILTIAZEM LOAD VIA INFUSION
10.0000 mg | Freq: Once | INTRAVENOUS | Status: AC
  Administered 2022-06-03: 10 mg via INTRAVENOUS
  Filled 2022-06-03: qty 10

## 2022-06-03 MED ORDER — HEPARIN BOLUS VIA INFUSION
4000.0000 [IU] | Freq: Once | INTRAVENOUS | Status: AC
  Administered 2022-06-03: 4000 [IU] via INTRAVENOUS
  Filled 2022-06-03: qty 4000

## 2022-06-03 MED ORDER — METOPROLOL TARTRATE 25 MG PO TABS
25.0000 mg | ORAL_TABLET | Freq: Two times a day (BID) | ORAL | Status: DC
  Administered 2022-06-04: 25 mg via ORAL
  Filled 2022-06-03: qty 1

## 2022-06-03 MED ORDER — ACETAMINOPHEN 325 MG PO TABS
650.0000 mg | ORAL_TABLET | Freq: Four times a day (QID) | ORAL | Status: DC | PRN
  Administered 2022-06-04: 650 mg via ORAL
  Filled 2022-06-03: qty 2

## 2022-06-03 NOTE — H&P (Incomplete)
History and Physical      Donald [name] HYW:[id number] DOB: [birth year] DOA: 06/03/2022  PCP: [name], MD *** Patient coming from: home ***  I have personally briefly reviewed patient's old medical records in [place]  Chief Complaint: ***  HPI: Donald [name] is a 51 y.o. male with medical history significant for *** who is admitted to [facility] on 06/03/2022 with *** after presenting from home*** to [facility] ED complaining of ***.    ***       ***   ED Course:  Vital signs in the ED were notable for the following: ***  Labs were notable for the following: ***  Per my interpretation, EKG in ED demonstrated the following:  ***  Imaging and additional notable ED work-up: ***  While in the ED, the following were administered: ***  Subsequently, the patient was admitted  ***  ***red    Review of Systems: As per HPI otherwise 10 point review of systems negative.   Past Medical History:  Diagnosis Date   Blood transfusion without reported diagnosis    DEPRESSION, MILD    Dysuria    G E R D    Hypercholesterolemia    Hyperlipidemia    NOT TAKING MEDICATION   Hypertension    Lumbago    NEPHROLITHIASIS, HX OF    PARASOMNIA    Right bundle branch block     Past Surgical History:  Procedure Laterality Date   CARDIAC CATHETERIZATION N/A 01/19/2016   Procedure: Left Heart Cath and Coronary Angiography;  Surgeon: Sherren Mocha, MD;  Location: [facility];  Service: Cardiovascular;  Laterality: N/A;   HAND SURGERY     right and left hand      Social History:  reports that he has quit smoking. His smoking use included cigarettes. He has never used smokeless tobacco. He reports that he does not drink alcohol and does not use drugs.   Allergies  Allergen Reactions   Ace Inhibitors Cough    Lisinopril caused cough    Family History  Problem Relation Age of Onset   Hypertension Mother    Hypertension Sister      Family history reviewed and not pertinent ***   Prior to Admission medications   Medication Sig Start Date End Date Taking? Authorizing Provider  amLODipine (NORVASC) 10 MG tablet Take 1 tablet (10 mg total) by mouth daily. 04/17/22   [name], MD  aspirin 325 MG tablet Take 162.5 mg by mouth at bedtime.    [provider]  Cholecalciferol (VITAMIN D3) 50 MCG (2000 UT) TABS Take one tablet by mouth daily. Patient not taking: Reported on 04/17/2022 04/25/21   [name], MD  hydrochlorothiazide (HYDRODIURIL) 25 MG tablet Take 1 tablet (25 mg total) by mouth daily. 04/17/22   [name], MD  losartan (COZAAR) 100 MG tablet Take 1 tablet (100 mg total) by mouth daily. 04/17/22   [name], MD  pravastatin (PRAVACHOL) 20 MG tablet Take 1 tablet (20 mg total) by mouth daily. Initially start 2 days per week, then increase to daily 04/17/22   [name], MD     Objective    Physical Exam: Vitals:   06/03/22 2045 06/03/22 2100 06/03/22 2115 06/03/22 2130  BP: (!) 181/119 (!) 140/93 126/88 (!) 137/102  Pulse: (!) 147 (!) 108 (!) 103 (!) 130  Resp: '14 14 11 15  '$ Temp:      TempSrc:  SpO2: 100% 100% 100% 100%  Weight:      Height:        General: appears to be stated age; alert, oriented Skin: warm, dry, no rash Head:  AT/[place] Mouth:  Oral mucosa membranes appear moist, normal dentition Neck: supple; trachea midline Heart:  RRR; did not appreciate any M/R/G Lungs: CTAB, did not appreciate any wheezes, rales, or rhonchi Abdomen: + BS; soft, ND, NT Vascular: 2+ pedal pulses b/l; 2+ radial pulses b/l Extremities: no peripheral edema, no muscle wasting Neuro: strength and sensation intact in upper and lower extremities b/l ***   *** Neuro: 5/5 strength of the proximal and distal flexors and extensors of the upper and lower extremities bilaterally; sensation intact in upper and lower extremities b/l; cranial nerves II through XII  grossly intact; no pronator drift; no evidence suggestive of slurred speech, dysarthria, or facial droop; Normal muscle tone. No tremors.  *** Neuro: In the setting of the patient's current mental status and associated inability to follow instructions, unable to perform full neurologic exam at this time.  As such, assessment of strength, sensation, and cranial nerves is limited at this time. Patient noted to spontaneously move all 4 extremities. No tremors.  ***    Labs on Admission: I have personally reviewed following labs and imaging studies  CBC: Recent Labs  Lab 06/03/22 2018  WBC 6.1  HGB 13.7  HCT 41.6  MCV 86.5  PLT 706   Basic Metabolic Panel: Recent Labs  Lab 06/03/22 2018  NA 138  K 3.6  CL 106  CO2 21*  GLUCOSE 108*  BUN 16  CREATININE 0.99  CALCIUM 8.8*   GFR: Estimated Creatinine Clearance: 113.4 mL/min (by C-G formula based on SCr of 0.99 mg/dL). Liver Function Tests: No results for input(s): "AST", "ALT", "ALKPHOS", "BILITOT", "PROT", "ALBUMIN" in the last 168 hours. No results for input(s): "LIPASE", "AMYLASE" in the last 168 hours. No results for input(s): "AMMONIA" in the last 168 hours. Coagulation Profile: No results for input(s): "INR", "PROTIME" in the last 168 hours. Cardiac Enzymes: No results for input(s): "CKTOTAL", "CKMB", "CKMBINDEX", "TROPONINI" in the last 168 hours. BNP (last 3 results) No results for input(s): "PROBNP" in the last 8760 hours. HbA1C: No results for input(s): "HGBA1C" in the last 72 hours. CBG: No results for input(s): "GLUCAP" in the last 168 hours. Lipid Profile: No results for input(s): "CHOL", "HDL", "LDLCALC", "TRIG", "CHOLHDL", "LDLDIRECT" in the last 72 hours. Thyroid Function Tests: No results for input(s): "TSH", "T4TOTAL", "FREET4", "T3FREE", "THYROIDAB" in the last 72 hours. Anemia Panel: No results for input(s): "VITAMINB12", "FOLATE", "FERRITIN", "TIBC", "IRON", "RETICCTPCT" in the last 72 hours. Urine  analysis:    Component Value Date/Time   COLORURINE YELLOW 09/01/2020 2100   APPEARANCEUR CLEAR 09/01/2020 2100   LABSPEC 1.016 09/01/2020 2100   PHURINE 6.0 09/01/2020 2100   GLUCOSEU NEGATIVE 09/01/2020 2100   GLUCOSEU NEGATIVE 08/19/2020 1610   HGBUR NEGATIVE 09/01/2020 2100   HGBUR negative 03/09/2010 0000   BILIRUBINUR negative 05/01/2022 1143   BILIRUBINUR negative 09/13/2014 1459   KETONESUR negative 05/01/2022 [place] 09/01/2020 2100   PROTEINUR negative 05/01/2022 1143   PROTEINUR NEGATIVE 09/01/2020 2100   UROBILINOGEN 0.2 05/01/2022 1143   UROBILINOGEN 0.2 08/19/2020 1610   NITRITE Negative 05/01/2022 1143   NITRITE NEGATIVE 09/01/2020 2100   LEUKOCYTESUR Negative 05/01/2022 1143   LEUKOCYTESUR NEGATIVE 09/01/2020 2100    Radiological Exams on Admission: DG Chest 2 View  Result Date: 06/03/2022 CLINICAL DATA:  Irregular heartbeat chest pain EXAM: CHEST - 2 VIEW COMPARISON:  09/08/2021 FINDINGS: The heart size and mediastinal contours are within normal limits. Both lungs are clear. The visualized skeletal structures are unremarkable. IMPRESSION: No active cardiopulmonary disease. Electronically Signed   By: Donavan Foil M.D.   On: 06/03/2022 21:21      Assessment/Plan   Principal Problem:   Atrial fibrillation with RVR (HCC)   ***       ***            ***             ***            ***            ***            ***             ***           ***           ***   ***  DVT prophylaxis: SCD's ***  Code Status: Full code*** Family Communication: none*** Disposition Plan: Per Rounding Team Consults called: none***;  Admission status: ***     I SPENT GREATER THAN 75 *** MINUTES IN CLINICAL CARE TIME/MEDICAL DECISION-MAKING IN COMPLETING THIS ADMISSION.      [place] DO [facility]  From [place]   06/03/2022,  11:57 PM   ***

## 2022-06-03 NOTE — ED Provider Notes (Cosign Needed)
[facility] EMERGENCY DEPARTMENT Provider Note   CSN: [id number] Arrival date & time: 06/03/22  2015     History  Chief Complaint  Patient presents with   Irregular Heart Beat    Micaiah Litle is a 51 y.o. male.  Patient presents to the emergency department via EMS with complaints of heart palpitations.  EMS noted that the patient was in A-fib RVR upon their arrival with rates in the 1 40-1 70 range.  Patient states that he normally takes half a 325 mg aspirin every day but took 2 halves today.  He initially rated this palpitation as a pain, 5 out of 10 in severity, now 3 out of 10 in severity.  He continues to complain of palpitations.  He endorses mild shortness of breath.  He denies abdominal pain, nausea, vomiting.  Denies any radiation of symptoms.  The patient has no history of atrial fibrillation.  He does not take a blood thinner.  Past medical history significant for essential hypertension, right bundle branch block, GERD  HPI     Home Medications Prior to Admission medications   Medication Sig Start Date End Date Taking? Authorizing Provider  amLODipine (NORVASC) 10 MG tablet Take 1 tablet (10 mg total) by mouth daily. 04/17/22   [name], MD  aspirin 325 MG tablet Take 162.5 mg by mouth at bedtime.    [provider]  Cholecalciferol (VITAMIN D3) 50 MCG (2000 UT) TABS Take one tablet by mouth daily. Patient not taking: Reported on 04/17/2022 04/25/21   [name], MD  hydrochlorothiazide (HYDRODIURIL) 25 MG tablet Take 1 tablet (25 mg total) by mouth daily. 04/17/22   [name], MD  losartan (COZAAR) 100 MG tablet Take 1 tablet (100 mg total) by mouth daily. 04/17/22   [name], MD  pravastatin (PRAVACHOL) 20 MG tablet Take 1 tablet (20 mg total) by mouth daily. Initially start 2 days per week, then increase to daily 04/17/22   [name], MD      Allergies    Ace inhibitors    Review of Systems    Review of Systems  Constitutional:  Negative for fever.  Respiratory:  Positive for shortness of breath.   Cardiovascular:  Positive for chest pain and palpitations. Negative for leg swelling.  Gastrointestinal:  Negative for abdominal pain, nausea and vomiting.  Genitourinary:  Negative for dysuria.    Physical Exam Updated Vital Signs BP (!) 137/102   Pulse (!) 130   Temp 98.2 F (36.8 C) (Oral)   Resp 15   Ht '6\' 2"'$  (1.88 m)   Wt 108.9 kg   SpO2 100%   BMI 30.81 kg/m  Physical Exam Vitals and nursing note reviewed.  Constitutional:      General: He is not in acute distress.    Appearance: He is well-developed.  HENT:     Head: Normocephalic and atraumatic.     Mouth/Throat:     Mouth: Mucous membranes are moist.  Eyes:     Conjunctiva/sclera: Conjunctivae normal.  Cardiovascular:     Rate and Rhythm: Tachycardia present. Rhythm irregular.     Heart sounds: No murmur heard. Pulmonary:     Effort: Pulmonary effort is normal. No respiratory distress.     Breath sounds: Normal breath sounds.  Abdominal:     Palpations: Abdomen is soft.     Tenderness: There is no abdominal tenderness.  Musculoskeletal:        General: No swelling.  Cervical back: Neck supple.  Skin:    General: Skin is warm and dry.     Capillary Refill: Capillary refill takes less than 2 seconds.  Neurological:     Mental Status: He is alert.  Psychiatric:        Mood and Affect: Mood normal.     ED Results / Procedures / Treatments   Labs (all labs ordered are listed, but only abnormal results are displayed) Labs Reviewed  BASIC METABOLIC PANEL - Abnormal; Notable for the following components:      Result Value   CO2 21 (*)    Glucose, Bld 108 (*)    Calcium 8.8 (*)    All other components within normal limits  CBC  HEPARIN LEVEL (UNFRACTIONATED)  CBC  HEPARIN LEVEL (UNFRACTIONATED)  TSH  TROPONIN I (HIGH SENSITIVITY)  TROPONIN I (HIGH SENSITIVITY)    EKG EKG  Interpretation  Date/Time:  Saturday June 03 2022 20:19:28 EST Ventricular Rate:  127 PR Interval:    QRS Duration: 171 QT Interval:  367 QTC Calculation: 534 R Axis:   75 Text Interpretation: Atrial fibrillation Right bundle branch block Confirmed by Orpah Greek [phone]) on 06/03/2022 8:42:22 PM  Radiology DG Chest 2 View  Result Date: 06/03/2022 CLINICAL DATA:  Irregular heartbeat chest pain EXAM: CHEST - 2 VIEW COMPARISON:  09/08/2021 FINDINGS: The heart size and mediastinal contours are within normal limits. Both lungs are clear. The visualized skeletal structures are unremarkable. IMPRESSION: No active cardiopulmonary disease. Electronically Signed   By: Donavan Foil M.D.   On: 06/03/2022 21:21    Procedures .Critical Care  Performed by: Dorothyann Peng, PA-C Authorized by: Dorothyann Peng, PA-C   Critical care provider statement:    Critical care time (minutes):  30   Critical care was necessary to treat or prevent imminent or life-threatening deterioration of the following conditions:  Cardiac failure   Critical care was time spent personally by me on the following activities:  Development of treatment plan with patient or surrogate, discussions with consultants, evaluation of patient's response to treatment, examination of patient, ordering and review of laboratory studies, ordering and review of radiographic studies, ordering and performing treatments and interventions, pulse oximetry, re-evaluation of patient's condition and review of old charts   Care discussed with: admitting provider       Medications Ordered in ED Medications  diltiazem (CARDIZEM) 1 mg/mL load via infusion 10 mg (10 mg Intravenous Bolus from Bag 06/03/22 2050)    And  diltiazem (CARDIZEM) 125 mg in dextrose 5% 125 mL (1 mg/mL) infusion (5 mg/hr Intravenous New Bag/Given 06/03/22 2049)  heparin ADULT infusion 100 units/mL (25000 units/237m) (1,400 Units/hr Intravenous New Bag/Given  06/03/22 2115)  metoprolol tartrate (LOPRESSOR) tablet 25 mg (has no administration in time range)  heparin bolus via infusion 4,000 Units (4,000 Units Intravenous Bolus from Bag 06/03/22 2115)    ED Course/ Medical Decision Making/ A&P                           Medical Decision Making Amount and/or Complexity of Data Reviewed Labs: ordered. Radiology: ordered.  Risk Prescription drug management. Decision regarding hospitalization.   This patient presents to the ED for concern of chest pain, lightheadedness, palpitations, this involves an extensive number of treatment options, and is a complaint that carries with it a high risk of complications and morbidity.  The differential diagnosis includes atrial fibrillation with RVR, ventricular dysrhythmia, and  others   Co morbidities that complicate the patient evaluation  History of hypertension   Additional history obtained:  Additional history obtained from EMS   Lab Tests:  I Ordered, and personally interpreted labs.  The pertinent results include:  Unremarkable BMP, CBC.  Initial troponin 4   Imaging Studies ordered:  I ordered imaging studies including chest x-ray I independently visualized and interpreted imaging which showed no acute findings I agree with the radiologist interpretation   Cardiac Monitoring: / EKG:  The patient was maintained on a cardiac monitor.  I personally viewed and interpreted the cardiac monitored which showed an underlying rhythm of: Atrial fibrillation with RVR   Consultations Obtained:  I requested consultation with the cardiologist, Dr. Radford Pax, and discussed lab and imaging findings as well as pertinent plan - they recommend: continued cardizem drip, medicine admit, will round on patient if needed/requested   Problem List / ED Course / Critical interventions / Medication management   I ordered medication including Cardizem for rate and rhythm control, heparin for  anticoagulation Reevaluation of the patient after these medicines showed that the patient improved I have reviewed the patients home medicines and have made adjustments as needed   Test / Admission - Considered:  The patient has new onset afib with RVR. Patient is currently receiving heparin and Cardizem and needs admission for further rate control and anticoagulation. Patient care being transferred to Lorre Munroe, PA-C with plans for medicine admission        Final Clinical Impression(s) / ED Diagnoses Final diagnoses:  Atrial fibrillation with RVR [facility])  Palpitations    Rx / DC Orders ED Discharge Orders     None         Ronny Bacon 06/03/22 2341

## 2022-06-03 NOTE — ED Notes (Signed)
Patient transported to X-ray 

## 2022-06-03 NOTE — Consult Note (Signed)
Cardiology Consultation:   Patient ID: Donald [name] MRN: [id number]; DOB: 02/07/1971  Admit date: 06/03/2022 Date of Consult: 06/03/2022  Primary Care Provider: [name], MD [facility] Cardiologist: Jenkins Rouge, MD  [facility] Electrophysiologist:  None    Patient Profile:   Donald [name] is a 51 y.o. male with a hx of GERD, depression, HLD, HTN and RBBB who is being seen today for the evaluation of new onset atrial fibrillation with RVR and chest pain at the request of Cherlynn June, [place].  History of Present Illness:   Mr. Donald [name] is a 51 y.o. male with a hx of GERD, depression, HLD, HTN and RBBB who is being seen today for the evaluation of new onset atrial fibrillation with RVR and chest pain.  He is followed by Dr. Johnsie Cancel and had a cardiac cath in 2017 for CP that showed 40% ostial Ramus and o/w normal coronary arteries with normal LVF. He then had a nuclear stress test in 2021 for CP that showed no ischemia.  He has a chronic RBBB.   He was in his USOH until he developed sudden onset of palpitations around 7pm tonight and started having chest discomfort along with SOB.  EMS was called and he was found to be in afib with RVR in the 140-170's.  He has a hard time describing his chest discomfort.  Initially said it was a "pain" and could not explain any further than that but then said it was more like his heart was pounding.  He says that he has CP from time to time as well as DOE.  He has not had any flu sx, cough, fever, chills, N/V or diarrhea.  He has had some chest discomfort across his chest and into his left shoulder but no diaphoresis or nausea.  He was started on IV Cardizem gtt and HR now in the 120's.  In ER K+ 3.6, Na 138, hs Trop 4, WBC 6.1, Hbg 13.7.  Currently does not complain of chest pain but is aware of his heart beating fast.  He has no hx of bleeding issues. Cardiology is asked to consult for help with afib treatment.    Past Medical  History:  Diagnosis Date   Blood transfusion without reported diagnosis    DEPRESSION, MILD    Dysuria    G E R D    Hypercholesterolemia    Hyperlipidemia    NOT TAKING MEDICATION   Hypertension    Lumbago    NEPHROLITHIASIS, HX OF    PARASOMNIA    Right bundle branch block     Past Surgical History:  Procedure Laterality Date   CARDIAC CATHETERIZATION N/A 01/19/2016   Procedure: Left Heart Cath and Coronary Angiography;  Surgeon: Sherren Mocha, MD;  Location: [place] [facility];  Service: Cardiovascular;  Laterality: N/A;   HAND SURGERY     right and left hand       Home Medications:  Prior to Admission medications   Medication Sig Start Date End Date Taking? Authorizing Provider  amLODipine (NORVASC) 10 MG tablet Take 1 tablet (10 mg total) by mouth daily. 04/17/22   [name], MD  aspirin 325 MG tablet Take 162.5 mg by mouth at bedtime.    [provider]  Cholecalciferol (VITAMIN D3) 50 MCG (2000 UT) TABS Take one tablet by mouth daily. Patient not taking: Reported on 04/17/2022 04/25/21   [name], MD  hydrochlorothiazide (HYDRODIURIL) 25 MG tablet Take 1 tablet (25 mg  total) by mouth daily. 04/17/22   [name], MD  losartan (COZAAR) 100 MG tablet Take 1 tablet (100 mg total) by mouth daily. 04/17/22   [name], MD  pravastatin (PRAVACHOL) 20 MG tablet Take 1 tablet (20 mg total) by mouth daily. Initially start 2 days per week, then increase to daily 04/17/22   [name], MD    Inpatient Medications: Scheduled Meds:  Continuous Infusions:  diltiazem (CARDIZEM) infusion 5 mg/hr (06/03/22 2049)   heparin 1,400 Units/hr (06/03/22 2115)   PRN Meds:   Allergies:    Allergies  Allergen Reactions   Ace Inhibitors Cough    Lisinopril caused cough    Social History:   Social History   Socioeconomic History   Marital status: Married    Spouse name: Not on file   Number of children: 6   Years of education: 15    Highest education level: Not on file  Occupational History   Occupation: Glass blower/designer    Employer: [employer]  Tobacco Use   Smoking status: Former    Types: Cigarettes   Smokeless tobacco: Never  Substance and Sexual Activity   Alcohol use: No    Alcohol/week: 0.0 standard drinks of alcohol   Drug use: No   Sexual activity: Not on file  Other Topics Concern   Not on file  Social History Narrative   ** Merged History Encounter **       Social Determinants of Health   Financial Resource Strain: Not on file  Food Insecurity: Not on file  Transportation Needs: Not on file  Physical Activity: Not on file  Stress: Not on file  Social Connections: Not on file  Intimate Partner Violence: Not on file    Family History:    Family History  Problem Relation Age of Onset   Hypertension Mother    Hypertension Sister      ROS:  Please see the history of present illness.   All other ROS reviewed and negative.     Physical Exam/Data:   Vitals:   06/03/22 2045 06/03/22 2100 06/03/22 2115 06/03/22 2130  BP: (!) 181/119 (!) 140/93 126/88 (!) 137/102  Pulse: (!) 147 (!) 108 (!) 103 (!) 130  Resp: '14 14 11 15  '$ Temp:      TempSrc:      SpO2: 100% 100% 100% 100%  Weight:      Height:       No intake or output data in the 24 hours ending 06/03/22 2326    06/03/2022    8:26 PM 05/01/2022   10:53 AM 04/17/2022    8:35 AM  Last 3 Weights  Weight (lbs) 240 lb 243 lb 3.2 oz 242 lb 3.2 oz  Weight (kg) 108.863 kg 110.315 kg 109.861 kg     Body mass index is 30.81 kg/m.  General:  Well nourished, well developed, in no acute distress HEENT: normal Lymph: no adenopathy Neck: no JVD Endocrine:  No thryomegaly Vascular: No carotid bruits; FA pulses 2+ bilaterally without bruits  Cardiac:  normal S1, S2; irregularly irregular and tachy; no murmur  Lungs:  clear to auscultation bilaterally, no wheezing, rhonchi or rales  Abd: soft, nontender, no hepatomegaly  Ext:  no edema Musculoskeletal:  No deformities, BUE and BLE strength normal and equal Skin: warm and dry  Neuro:  CNs 2-12 intact, no focal abnormalities noted Psych:  Normal affect   EKG:  The EKG was personally reviewed and demonstrates:  atrial fibrillation with RVR at 127bpm and RBBB Telemetry:  Telemetry was personally reviewed and demonstrates:  atrial fibrillation with RBR  Relevant CV Studies  Cardiac Cath 01/2016 Conclusion    Ost Ramus lesion, 40 %stenosed. There is hyperdynamic left ventricular systolic function. LV end diastolic pressure is normal. The left ventricular ejection fraction is greater than 65% by visual estimate.   1. Mild nonobstructive CAD 2. Hyperdynamic LV function   Nuclear Stress Test 2021 Study Highlights    Nuclear stress EF: 65%. There was no ST segment deviation noted during stress. The study is normal. The left ventricular ejection fraction is normal (55-65%).   Normal stress nuclear study with no ischemia or infarction.  Gated ejection fraction 65% with normal wall motion.   Laboratory Data:  High Sensitivity Troponin:   Recent Labs  Lab 06/03/22 2018  TROPONINIHS 4     Chemistry Recent Labs  Lab 06/03/22 2018  NA 138  K 3.6  CL 106  CO2 21*  GLUCOSE 108*  BUN 16  CREATININE 0.99  CALCIUM 8.8*  GFRNONAA >60  ANIONGAP 11    No results for input(s): "PROT", "ALBUMIN", "AST", "ALT", "ALKPHOS", "BILITOT" in the last 168 hours. Hematology Recent Labs  Lab 06/03/22 2018  WBC 6.1  RBC 4.81  HGB 13.7  HCT 41.6  MCV 86.5  MCH 28.5  MCHC 32.9  RDW 14.3  PLT 272   BNPNo results for input(s): "BNP", "PROBNP" in the last 168 hours.  DDimer No results for input(s): "DDIMER" in the last 168 hours.   Radiology/Studies:  DG Chest 2 View  Result Date: 06/03/2022 CLINICAL DATA:  Irregular heartbeat chest pain EXAM: CHEST - 2 VIEW COMPARISON:  09/08/2021 FINDINGS: The heart size and mediastinal contours are within normal  limits. Both lungs are clear. The visualized skeletal structures are unremarkable. IMPRESSION: No active cardiopulmonary disease. Electronically Signed   By: Donavan Foil M.D.   On: 06/03/2022 21:21     Assessment and Plan:   New onset atrial fibrillation with RVR -no hx of arrhythmias in the past -had sudden onset of CP at 7pm tonight and was found to be in afib with RVR by EMS -has had some chest pain and SOB this week but difficult to ascertain whether he has CP or is describing his palpitations -HR initially in the 160's but now in the 120's on IV Cardizem gtt -continue IV Cardizem gtt and titrate as needed for HR -started on IV Heparin gtt in ER -can convert IV Heparin to DOAC in am if echo and follow hsTrop are normal>>CHADS2VASC score is 1 so will not need longterm anticoagulation after his initial 4 weeks post DCCV -If he does not convert on his own he will need TEE/DCCV on Tuesday -check TSH -check 2D echo in am to assess LVF and LA size  2.  Chest pain -again difficult to say whether he is describing CP or palpitations -he has had CP episodes in the past with known 40% oRamus on cath 2017 and normal nuclear stress test 2021 -hsTrop normal at 4 despite CP and afib with RVR which is reassuring.   -continue to cycle trop -starting on IV Heparin gtt - if repeat hsTrop normal and echo normal then transition to [place] tomorrow and consider outpt coronary CTA once NSR has been restored or nuclear stress testing  3.  HTN -BP has been elevated -continue Cardizem gtt -stop amlodipine as we will transition to oral Cardizem once NSR reestablished -start Lopressor '25mg'$   BID -he is on Losartan '100mg'$  daily at home which can be restarted in am if BP stable -hold diuretic for now      CHA2DS2-VASc Score = 1   This indicates a 0.6% annual risk of stroke. The patient's score is based upon: CHF History: 0 HTN History: 1 Diabetes History: 0 Stroke History: 0 Vascular Disease History:  0 Age Score: 0 Gender Score: 0          For questions or updates, please contact [place] Please consult [email or web address] for contact info under    Signed, Fransico Him, MD  06/03/2022 11:26 PM

## 2022-06-03 NOTE — Progress Notes (Signed)
ANTICOAGULATION CONSULT NOTE - Initial Consult  Pharmacy Consult for heparin  Indication: atrial fibrillation   Allergies  Allergen Reactions   Ace Inhibitors Cough    Lisinopril caused cough    Patient Measurements: Height: '6\' 2"'$  (188 cm) Weight: 108.9 kg (240 lb) IBW/kg (Calculated) : 82.2 Heparin Dosing Weight: 104.6  Vital Signs: Temp: 98.2 F (36.8 C) (12/30 2023) Temp Source: Oral (12/30 2023) BP: 155/94 (12/30 2023) Pulse Rate: 124 (12/30 2023)  Labs: No results for input(s): "HGB", "HCT", "PLT", "APTT", "LABPROT", "INR", "HEPARINUNFRC", "HEPRLOWMOCWT", "CREATININE", "CKTOTAL", "CKMB", "TROPONINIHS" in the last 72 hours.  CrCl cannot be calculated (Patient's most recent lab result is older than the maximum 21 days allowed.).   Medical History: Past Medical History:  Diagnosis Date   Blood transfusion without reported diagnosis    DEPRESSION, MILD    Dysuria    G E R D    Hypercholesterolemia    Hyperlipidemia    NOT TAKING MEDICATION   Hypertension    Lumbago    NEPHROLITHIASIS, HX OF    PARASOMNIA    Right bundle branch block     Assessment: Patient admitted with CC of chest pain, not on anticoagulation PTA. Found to be in afib with RVR. Pharmacy consulted to dose heparin.   Goal of Therapy:  Heparin level 0.3-0.7 units/ml Monitor platelets by anticoagulation protocol: Yes   Plan:  Give 4000 units bolus x 1 Start heparin infusion at 1400 units/hr Check anti-Xa level in 6 hours and daily while on heparin Continue to monitor H&H and platelets  Esmeralda Arthur, PharmD, BCCCP  06/03/2022,8:49 PM

## 2022-06-03 NOTE — ED Triage Notes (Addendum)
Patient arrived to the ED with complaints of chest discomfort. EMS states upon arrival he was in a-fib RVR. Patient states he had discomfort yesterday, but didn't do anything about it. Patient states about an hour ago he started having central chest pain that radiates to his back. Patient states initial chest pain was 5/10, and is now 4/10. Patient did not take pain medication specifically for this, but took his daily aspirin 2 hours ago. Initially with EMS patient rate was 140-170, since then has dropped to 100-150. Patient has no history of a-fib.

## 2022-06-04 ENCOUNTER — Encounter (HOSPITAL_COMMUNITY): Payer: Self-pay | Admitting: Internal Medicine

## 2022-06-04 ENCOUNTER — Other Ambulatory Visit: Payer: Self-pay

## 2022-06-04 ENCOUNTER — Telehealth: Payer: Self-pay | Admitting: Physician Assistant

## 2022-06-04 DIAGNOSIS — R0789 Other chest pain: Secondary | ICD-10-CM | POA: Diagnosis not present

## 2022-06-04 DIAGNOSIS — I4891 Unspecified atrial fibrillation: Secondary | ICD-10-CM | POA: Diagnosis not present

## 2022-06-04 DIAGNOSIS — I10 Essential (primary) hypertension: Secondary | ICD-10-CM | POA: Diagnosis not present

## 2022-06-04 DIAGNOSIS — E782 Mixed hyperlipidemia: Secondary | ICD-10-CM | POA: Diagnosis not present

## 2022-06-04 LAB — CBC
HCT: 41.1 % (ref 39.0–52.0)
Hemoglobin: 14.2 g/dL (ref 13.0–17.0)
MCH: 28.7 pg (ref 26.0–34.0)
MCHC: 34.5 g/dL (ref 30.0–36.0)
MCV: 83 fL (ref 80.0–100.0)
Platelets: 285 10*3/uL (ref 150–400)
RBC: 4.95 MIL/uL (ref 4.22–5.81)
RDW: 14.3 % (ref 11.5–15.5)
WBC: 6.2 10*3/uL (ref 4.0–10.5)
nRBC: 0 % (ref 0.0–0.2)

## 2022-06-04 LAB — COMPREHENSIVE METABOLIC PANEL
ALT: 22 U/L (ref 0–44)
AST: 22 U/L (ref 15–41)
Albumin: 3.7 g/dL (ref 3.5–5.0)
Alkaline Phosphatase: 39 U/L (ref 38–126)
Anion gap: 4 — ABNORMAL LOW (ref 5–15)
BUN: 11 mg/dL (ref 6–20)
CO2: 27 mmol/L (ref 22–32)
Calcium: 8.8 mg/dL — ABNORMAL LOW (ref 8.9–10.3)
Chloride: 107 mmol/L (ref 98–111)
Creatinine, Ser: 0.85 mg/dL (ref 0.61–1.24)
GFR, Estimated: >60 mL/min (ref >60)
Glucose, Bld: 119 mg/dL — ABNORMAL HIGH (ref 70–99)
Potassium: 4 mmol/L (ref 3.5–5.1)
Sodium: 138 mmol/L (ref 135–145)
Total Bilirubin: 0.6 mg/dL (ref 0.3–1.2)
Total Protein: 7.2 g/dL (ref 6.5–8.1)

## 2022-06-04 LAB — MAGNESIUM: Magnesium: 1.9 mg/dL (ref 1.7–2.4)

## 2022-06-04 LAB — MRSA NEXT GEN BY PCR, NASAL: MRSA by PCR Next Gen: NOT DETECTED

## 2022-06-04 LAB — TSH: TSH: 1.58 u[IU]/mL (ref 0.350–4.500)

## 2022-06-04 LAB — TROPONIN I (HIGH SENSITIVITY): Troponin I (High Sensitivity): 5 ng/L (ref <18)

## 2022-06-04 LAB — HEPARIN LEVEL (UNFRACTIONATED): Heparin Unfractionated: 0.3 IU/mL (ref 0.30–0.70)

## 2022-06-04 MED ORDER — PRAVASTATIN SODIUM 40 MG PO TABS
20.0000 mg | ORAL_TABLET | Freq: Every day | ORAL | Status: DC
  Administered 2022-06-04: 20 mg via ORAL
  Filled 2022-06-04: qty 1

## 2022-06-04 MED ORDER — APIXABAN 5 MG PO TABS
5.0000 mg | ORAL_TABLET | Freq: Two times a day (BID) | ORAL | Status: DC
  Administered 2022-06-04: 5 mg via ORAL
  Filled 2022-06-04: qty 1

## 2022-06-04 MED ORDER — DILTIAZEM HCL ER COATED BEADS 120 MG PO CP24: 120.0000 mg | ORAL_CAPSULE | Freq: Every day | ORAL | 0 refills | Status: DC

## 2022-06-04 MED ORDER — INFLUENZA VAC SPLIT QUAD 0.5 ML IM SUSY
0.5000 mL | PREFILLED_SYRINGE | INTRAMUSCULAR | Status: AC
  Administered 2022-06-04: 0.5 mL via INTRAMUSCULAR
  Filled 2022-06-04: qty 0.5

## 2022-06-04 MED ORDER — APIXABAN 5 MG PO TABS: 5.0000 mg | ORAL_TABLET | Freq: Two times a day (BID) | ORAL | 0 refills | Status: DC

## 2022-06-04 MED ORDER — DILTIAZEM HCL ER COATED BEADS 180 MG PO CP24
180.0000 mg | ORAL_CAPSULE | Freq: Every day | ORAL | Status: DC
  Filled 2022-06-04: qty 1

## 2022-06-04 MED ORDER — ORAL CARE MOUTH RINSE: 15.0000 mL | OROMUCOSAL | Status: DC | PRN

## 2022-06-04 MED ORDER — LOSARTAN POTASSIUM 50 MG PO TABS
100.0000 mg | ORAL_TABLET | Freq: Every day | ORAL | Status: DC
  Administered 2022-06-04: 100 mg via ORAL
  Filled 2022-06-04: qty 2

## 2022-06-04 NOTE — Discharge Summary (Signed)
Physician Discharge Summary  Donald [name] RDE:[id number] DOB: [birth year] DOA: 06/03/2022  PCP: [name], MD  Admit date: 06/03/2022 Discharge date: 06/04/2022  Admitted From: (Home) Disposition:  (Home)  Recommendations for Outpatient Follow-up:  Follow up with PCP in 1-2 weeks Please obtain BMP/CBC in one week To follow-up with cardiology, and echo to be done as an outpatient.   Discharge Condition: (Stable) Diet recommendation: Heart Healthy    Brief/Interim Summary: Donald [name] is a 51 y.o. male with medical history significant for essential hypertension, hyperlipidemia, right bundle branch block, who is admitted to [facility] on 06/03/2022 with atrial fibrillation with RVR after presenting from home to [facility] ED complaining of palpitations.  He was started on heparin drip, Cardizem drip, he converted to sinus rhythm overnight, he was transitioned to Eliquis and Cardizem CD, please see discussion below.  Paroxysmal A-fib - Previous cardiac catheterization by Dr. Johnsie Cancel 2017 showed 40% ostial ramus otherwise normal coronary arteries with normal EF.  Nuclear stress test 2021 was low risk with no ischemia.  EF 65%.  Chronic right bundle branch block. -On 06/03/2022 at 7 PM developed chest discomfort with rapid palpitations.  He was found to be in atrial fibrillation with rapid ventricular sponsor 140s to 170s.  Original troponin was 4.  Cardizem drip started. -Verted to normal sinus rhythm overnight, this morning Heparin Drip has been transitioned to Eliquis 5 mg oral twice daily, Cardizem drip was discontinued overnight, he received 25 mg of p.o. metoprolol this morning, he will be discharged on Cardizem CD 120 mg oral daily as discussed with cardiology, amlodipine will be discontinued at time of discharge. -2D echo as an outpatient -TSH within normal limit  Chest discomfort -Likely due to above, no elevation in troponin, not ACS, he is currently on  Eliquis  Hypertension -Continue with losartan, will stop amlodipine as starting diltiazem  Hyperlipidemia -Continue with statin      Discharge Diagnoses:  Principal Problem:   Atrial fibrillation with RVR ([place]) Active Problems:   Essential hypertension   HLD (hyperlipidemia)   Atypical chest pain    Discharge Instructions  Discharge Instructions     Diet - low sodium heart healthy   Complete by: As directed    Discharge instructions   Complete by: As directed    Follow with Primary MD [name], MD in 7 days   Get CBC, CMP,  checked  by Primary MD next visit.    Activity: As tolerated with Full fall precautions use walker/cane & assistance as needed   Disposition Home    Diet: Heart Healthy  .  On your next visit with your primary care physician please Get Medicines reviewed and adjusted.   Please request your Prim.MD to go over all Hospital Tests and Procedure/Radiological results at the follow up, please get all Hospital records sent to your Prim MD by signing hospital release before you go home.   If you experience worsening of your admission symptoms, develop shortness of breath, life threatening emergency, suicidal or homicidal thoughts you must seek medical attention immediately by calling 911 or calling your MD immediately  if symptoms less severe.  You Must read complete instructions/literature along with all the possible adverse reactions/side effects for all the Medicines you take and that have been prescribed to you. Take any new Medicines after you have completely understood and accpet all the possible adverse reactions/side effects.   Do not drive, operating heavy machinery, perform activities at heights, swimming or participation in  water activities or provide baby sitting services if your were admitted for syncope or siezures until you have seen by Primary MD or a Neurologist and advised to do so again.  Do not drive when taking Pain  medications.    Do not take more than prescribed Pain, Sleep and Anxiety Medications  Special Instructions: If you have smoked or chewed Tobacco  in the last 2 yrs please stop smoking, stop any regular Alcohol  and or any Recreational drug use.  Wear Seat belts while driving.   Please note  You were cared for by a hospitalist during your hospital stay. If you have any questions about your discharge medications or the care you received while you were in the hospital after you are discharged, you can call the unit and asked to speak with the hospitalist on call if the hospitalist that took care of you is not available. Once you are discharged, your primary care physician will handle any further medical issues. Please note that NO REFILLS for any discharge medications will be authorized once you are discharged, as it is imperative that you return to your primary care physician (or establish a relationship with a primary care physician if you do not have one) for your aftercare needs so that they can reassess your need for medications and monitor your lab values.   Increase activity slowly   Complete by: As directed       Allergies as of 06/04/2022       Reactions   Ace Inhibitors Cough   Lisinopril caused cough   Pork-derived Products         Medication List     STOP taking these medications    amLODipine 10 MG tablet Commonly known as: NORVASC   aspirin 325 MG tablet   hydrochlorothiazide 25 MG tablet Commonly known as: HYDRODIURIL       TAKE these medications    apixaban 5 MG Tabs tablet Commonly known as: ELIQUIS Take 1 tablet (5 mg total) by mouth 2 (two) times daily.   diltiazem 120 MG 24 hr capsule Commonly known as: Cardizem CD Take 1 capsule (120 mg total) by mouth daily.   losartan 100 MG tablet Commonly known as: COZAAR Take 1 tablet (100 mg total) by mouth daily.   pravastatin 20 MG tablet Commonly known as: PRAVACHOL Take 1 tablet (20 mg total) by  mouth daily. Initially start 2 days per week, then increase to daily   Vitamin D3 50 MCG (2000 UT) Tabs Take one tablet by mouth daily.        Follow-up Information     [facility] Follow up.   Specialty: Cardiology Why: Afib clinic will call you with your appointment information Contact information: [address] [phone] [place] [address] [facility] [place] [facility]. [facility] Follow up.   Specialty: Cardiology Why: [facility] office will call you to arrange your heart ultrasound (echocardiogram). Contact information: [address], [address] [id number] mc [place] [address] [phone]               Allergies  Allergen Reactions   Ace Inhibitors Cough    Lisinopril caused cough   Pork-Derived Products     Consultations: Cardiolgoy   Procedures/Studies: DG Chest 2 View  Result Date: 06/03/2022 CLINICAL DATA:  Irregular heartbeat chest pain EXAM: CHEST - 2 VIEW COMPARISON:  09/08/2021  FINDINGS: The heart size and mediastinal contours are within normal limits. Both lungs are clear. The visualized skeletal structures are unremarkable. IMPRESSION: No active cardiopulmonary disease. Electronically Signed   By: Donavan Foil M.D.   On: 06/03/2022 21:21      Subjective: No significant events overnight, he denies any complaints today  Discharge Exam: Vitals:   06/04/22 0923 06/04/22 1000  BP:  137/81  Pulse: 75 69  Resp:  15  Temp:    SpO2:  98%   Vitals:   06/04/22 0800 06/04/22 0911 06/04/22 0923 06/04/22 1000  BP: 117/78 113/76  137/81  Pulse: 75 68 75 69  Resp: (!) '21 15  15  '$ Temp:  98 F (36.7 C)    TempSrc:  Oral    SpO2: 97% 96%  98%  Weight:      Height:        General: Pt is alert, awake, not in acute distress Cardiovascular: RRR, S1/S2 +, no rubs, no gallops Respiratory: CTA bilaterally, no wheezing,  no rhonchi Abdominal: Soft, NT, ND, bowel sounds + Extremities: no edema, no cyanosis    The results of significant diagnostics from this hospitalization (including imaging, microbiology, ancillary and laboratory) are listed below for reference.     Microbiology: Recent Results (from the past 240 hour(s))  MRSA Next Gen by PCR, Nasal     Status: None   Collection Time: 06/04/22  5:16 AM   Specimen: Nasal Mucosa; Nasal Swab  Result Value Ref Range Status   MRSA by PCR Next Gen NOT DETECTED NOT DETECTED Final    Comment: (NOTE) The GeneXpert MRSA Assay (FDA approved for NASAL specimens only), is one component of a comprehensive MRSA colonization surveillance program. It is not intended to diagnose MRSA infection nor to guide or monitor treatment for MRSA infections. Test performance is not FDA approved in patients less than 61 years old. Performed at [facility], [place] [address] [place], [place] [address]      Labs: BNP (last 3 results) No results for input(s): "BNP" in the last 8760 hours. Basic Metabolic Panel: Recent Labs  Lab 06/03/22 2018 06/04/22 0543  NA 138 138  K 3.6 4.0  CL 106 107  CO2 21* 27  GLUCOSE 108* 119*  BUN 16 11  CREATININE 0.99 0.85  CALCIUM 8.8* 8.8*  MG  --  1.9   Liver Function Tests: Recent Labs  Lab 06/04/22 0543  AST 22  ALT 22  ALKPHOS 39  BILITOT 0.6  PROT 7.2  ALBUMIN 3.7   No results for input(s): "LIPASE", "AMYLASE" in the last 168 hours. No results for input(s): "AMMONIA" in the last 168 hours. CBC: Recent Labs  Lab 06/03/22 2018 06/04/22 0543  WBC 6.1 6.2  HGB 13.7 14.2  HCT 41.6 41.1  MCV 86.5 83.0  PLT 272 285   Cardiac Enzymes: No results for input(s): "CKTOTAL", "CKMB", "CKMBINDEX", "TROPONINI" in the last 168 hours. BNP: Invalid input(s): "POCBNP" CBG: No results for input(s): "GLUCAP" in the last 168 hours. D-Dimer No results for input(s): "DDIMER" in the last 72 hours. Hgb A1c No results for  input(s): "HGBA1C" in the last 72 hours. Lipid Profile No results for input(s): "CHOL", "HDL", "LDLCALC", "TRIG", "CHOLHDL", "LDLDIRECT" in the last 72 hours. Thyroid function studies Recent Labs    06/04/22 0543  TSH 1.580   Anemia work up No results for input(s): "VITAMINB12", "FOLATE", "FERRITIN", "TIBC", "IRON", "RETICCTPCT" in the last 72 hours. Urinalysis    Component Value  Date/Time   COLORURINE YELLOW 09/01/2020 2100   APPEARANCEUR CLEAR 09/01/2020 2100   LABSPEC 1.016 09/01/2020 2100   PHURINE 6.0 09/01/2020 2100   GLUCOSEU NEGATIVE 09/01/2020 2100   GLUCOSEU NEGATIVE 08/19/2020 1610   HGBUR NEGATIVE 09/01/2020 2100   HGBUR negative 03/09/2010 0000   BILIRUBINUR negative 05/01/2022 1143   BILIRUBINUR negative 09/13/2014 1459   KETONESUR negative 05/01/2022 [place] 09/01/2020 2100   PROTEINUR negative 05/01/2022 1143   PROTEINUR NEGATIVE 09/01/2020 2100   UROBILINOGEN 0.2 05/01/2022 1143   UROBILINOGEN 0.2 08/19/2020 1610   NITRITE Negative 05/01/2022 1143   NITRITE NEGATIVE 09/01/2020 2100   LEUKOCYTESUR Negative 05/01/2022 1143   LEUKOCYTESUR NEGATIVE 09/01/2020 2100   Sepsis Labs Recent Labs  Lab 06/03/22 2018 06/04/22 0543  WBC 6.1 6.2   Microbiology Recent Results (from the past 240 hour(s))  MRSA Next Gen by PCR, Nasal     Status: None   Collection Time: 06/04/22  5:16 AM   Specimen: Nasal Mucosa; Nasal Swab  Result Value Ref Range Status   MRSA by PCR Next Gen NOT DETECTED NOT DETECTED Final    Comment: (NOTE) The GeneXpert MRSA Assay (FDA approved for NASAL specimens only), is one component of a comprehensive MRSA colonization surveillance program. It is not intended to diagnose MRSA infection nor to guide or monitor treatment for MRSA infections. Test performance is not FDA approved in patients less than 53 years old. Performed at [facility], [place] [address] [place], [place] [address]      Time coordinating  discharge: Over 30 minutes  SIGNED:   Phillips Climes, MD  [facility] 06/04/2022, 11:17 AM Pager   If 7PM-7AM, please contact night-coverage [email or web address]

## 2022-06-04 NOTE — Discharge Instructions (Signed)
Follow with Primary MD [name], MD in 7 days   Get CBC, CMP,  checked  by Primary MD next visit.    Activity: As tolerated with Full fall precautions use walker/cane & assistance as needed   Disposition Home    Diet: Heart Healthy  .  On your next visit with your primary care physician please Get Medicines reviewed and adjusted.   Please request your Prim.MD to go over all Hospital Tests and Procedure/Radiological results at the follow up, please get all Hospital records sent to your Prim MD by signing hospital release before you go home.   If you experience worsening of your admission symptoms, develop shortness of breath, life threatening emergency, suicidal or homicidal thoughts you must seek medical attention immediately by calling 911 or calling your MD immediately  if symptoms less severe.  You Must read complete instructions/literature along with all the possible adverse reactions/side effects for all the Medicines you take and that have been prescribed to you. Take any new Medicines after you have completely understood and accpet all the possible adverse reactions/side effects.   Do not drive, operating heavy machinery, perform activities at heights, swimming or participation in water activities or provide baby sitting services if your were admitted for syncope or siezures until you have seen by Primary MD or a Neurologist and advised to do so again.  Do not drive when taking Pain medications.    Do not take more than prescribed Pain, Sleep and Anxiety Medications  Special Instructions: If you have smoked or chewed Tobacco  in the last 2 yrs please stop smoking, stop any regular Alcohol  and or any Recreational drug use.  Wear Seat belts while driving.   Please note  You were cared for by a hospitalist during your hospital stay. If you have any questions about your discharge medications or the care you received while you were in the hospital after you are  discharged, you can call the unit and asked to speak with the hospitalist on call if the hospitalist that took care of you is not available. Once you are discharged, your primary care physician will handle any further medical issues. Please note that NO REFILLS for any discharge medications will be authorized once you are discharged, as it is imperative that you return to your primary care physician (or establish a relationship with a primary care physician if you do not have one) for your aftercare needs so that they can reassess your need for medications and monitor your lab values.

## 2022-06-04 NOTE — Telephone Encounter (Signed)
   Dr. Marlou Porch requests 2 week f/u in afib clinic. Will route this msg to afib clinic nurse to help schedule and call pt with appt info. Per Dr. Marlou Porch also sent staff msg to scheduling team @ [facility] to arrange outpatient echo - he does not feel this needs to be done prior to afib clinic OV.  Charlie Pitter, PA-C

## 2022-06-04 NOTE — TOC Transition Note (Signed)
Transition of Care [facility]) - CM/SW Discharge Note   Patient Details  Name: Donald [name] MRN: [id number] Date of Birth: [birth year]  [facility] CM/SW Contact:  Carles Collet, RN Phone Number: 06/04/2022, 11:31 AM   Clinical Narrative:      Verified patient has Eliquis card for DC. No other TOC needs identified       Patient Goals and CMS Choice      Discharge Placement                         Discharge Plan and Services Additional resources added to the After Visit Summary for                                       Social Determinants of Health (SDOH) Interventions SDOH Screenings   Food Insecurity: No Food Insecurity (06/04/2022)  Housing: Low Risk  (06/04/2022)  Transportation Needs: No Transportation Needs (06/04/2022)  Utilities: Not At Risk (06/04/2022)  Depression (PHQ2-9): Low Risk  (05/01/2022)  Tobacco Use: Medium Risk (06/04/2022)     Readmission Risk Interventions     No data to display

## 2022-06-04 NOTE — Progress Notes (Addendum)
Maurice for heparin  Indication: atrial fibrillation  Brief A/P: Heparin level within goal range Continue Heparin at current rate   Allergies  Allergen Reactions   Ace Inhibitors Cough    Lisinopril caused cough   Pork-Derived Products     Patient Measurements: Height: '6\' 2"'$  (188 cm) Weight: 107.6 kg (237 lb 3.4 oz) IBW/kg (Calculated) : 82.2 Heparin Dosing Weight: 104.6  Vital Signs: Temp: 98.2 F (36.8 C) (12/31 0517) Temp Source: Oral (12/31 0517) BP: 121/79 (12/31 0517) Pulse Rate: 69 (12/31 0517)  Labs: Recent Labs    06/03/22 2018 06/04/22 0543  HGB 13.7 14.2  HCT 41.6 41.1  PLT 272 285  HEPARINUNFRC  --  0.30  CREATININE 0.99  --   TROPONINIHS 4  --     Estimated Creatinine Clearance: 112.8 mL/min (by C-G formula based on SCr of 0.99 mg/dL).  Assessment: 51 y.o. male with Afib for heparin  Goal of Therapy:  Heparin level 0.3-0.7 units/ml Monitor platelets by anticoagulation protocol: Yes   Plan:  Continue Heparin at current rate  F/U plan for oral anticoagulation  Phillis Knack, PharmD, BCPS  06/04/2022,6:26 AM

## 2022-06-04 NOTE — Progress Notes (Addendum)
Patient admitted to floor. Wife and niece accompanying patient. Wife with baby under scarf covering baby. When RN noticed baby, advised and requested patient's wife to take baby home due to high rates of flu and covid and that it is against policy for visitors under 12. Patient and wife agreeable to take infant home, but infant and wife still remain at bedside 45 minutes later. No clear answer on when ride will arrive. AC and charge notified.   Update:  Wife and infant left bedside to be picked up by family out front.

## 2022-06-04 NOTE — Progress Notes (Signed)
Rounding Note    Patient Name: Donald [name] Date of Encounter: 06/04/2022  [place] Cardiologist: Jenkins Rouge, MD   Subjective   Feels well.  No chest pain no shortness of breath.  Converted overnight.  Wife at bedside.  Inpatient Medications    Scheduled Meds:  [START ON 06/05/2022] influenza vac split quadrivalent PF  0.5 mL Intramuscular Tomorrow-1000   losartan  100 mg Oral Daily   metoprolol tartrate  25 mg Oral BID   pravastatin  20 mg Oral Daily   Continuous Infusions:  diltiazem (CARDIZEM) infusion Stopped (06/04/22 0654)   heparin 1,400 Units/hr (06/04/22 0532)   PRN Meds: acetaminophen **OR** acetaminophen, melatonin, mouth rinse   Vital Signs    Vitals:   06/04/22 0700 06/04/22 0800 06/04/22 0911 06/04/22 0923  BP: 119/77 117/78 113/76   Pulse: 64 75 68 75  Resp: 15 (!) 21 15   Temp:   98 F (36.7 C)   TempSrc:   Oral   SpO2: 96% 97% 96%   Weight:      Height:        Intake/Output Summary (Last 24 hours) at 06/04/2022 0949 Last data filed at 06/04/2022 0532 Gross per 24 hour  Intake 289.01 ml  Output --  Net 289.01 ml      06/04/2022    5:00 AM 06/03/2022    8:26 PM 05/01/2022   10:53 AM  Last 3 Weights  Weight (lbs) 237 lb 3.4 oz 240 lb 243 lb 3.2 oz  Weight (kg) 107.6 kg 108.863 kg 110.315 kg      Telemetry    Sinus rhythm 70- Personally Reviewed  ECG    Right bundle branch block A-fib RVR 127 bpm- Personally Reviewed  Physical Exam   GEN: No acute distress.   Neck: No JVD Cardiac: RRR, no murmurs, rubs, or gallops.  Respiratory: Clear to auscultation bilaterally. GI: Soft, nontender, non-distended  MS: No edema; No deformity. Neuro:  Nonfocal  Psych: Normal affect   Labs    High Sensitivity Troponin:   Recent Labs  Lab 06/03/22 2018 06/04/22 0543  TROPONINIHS 4 5     Chemistry Recent Labs  Lab 06/03/22 2018 06/04/22 0543  NA 138 138  K 3.6 4.0  CL 106 107  CO2 21* 27  GLUCOSE 108* 119*   BUN 16 11  CREATININE 0.99 0.85  CALCIUM 8.8* 8.8*  MG  --  1.9  PROT  --  7.2  ALBUMIN  --  3.7  AST  --  22  ALT  --  22  ALKPHOS  --  39  BILITOT  --  0.6  GFRNONAA >60 >60  ANIONGAP 11 4*    Lipids No results for input(s): "CHOL", "TRIG", "HDL", "LABVLDL", "LDLCALC", "CHOLHDL" in the last 168 hours.  Hematology Recent Labs  Lab 06/03/22 2018 06/04/22 0543  WBC 6.1 6.2  RBC 4.81 4.95  HGB 13.7 14.2  HCT 41.6 41.1  MCV 86.5 83.0  MCH 28.5 28.7  MCHC 32.9 34.5  RDW 14.3 14.3  PLT 272 285   Thyroid  Recent Labs  Lab 06/04/22 0543  TSH 1.580    BNPNo results for input(s): "BNP", "PROBNP" in the last 168 hours.  DDimer No results for input(s): "DDIMER" in the last 168 hours.   Radiology    DG Chest 2 View  Result Date: 06/03/2022 CLINICAL DATA:  Irregular heartbeat chest pain EXAM: CHEST - 2 VIEW COMPARISON:  09/08/2021 FINDINGS: The heart size and mediastinal  contours are within normal limits. Both lungs are clear. The visualized skeletal structures are unremarkable. IMPRESSION: No active cardiopulmonary disease. Electronically Signed   By: Donavan Foil M.D.   On: 06/03/2022 21:21     Patient Profile     51 y.o. male with new onset atrial fibrillation RVR, chest pain, right bundle branch block hyperlipidemia hypertension GERD depression  Assessment & Plan    Paroxysmal atrial fibrillation - Previous cardiac catheterization by Dr. Johnsie Cancel 2017 showed 40% ostial ramus otherwise normal coronary arteries with normal EF.  Nuclear stress test 2021 was low risk with no ischemia.  EF 65%.  Chronic right bundle branch block. -On 06/03/2022 at 7 PM developed chest discomfort with rapid palpitations.  He was found to be in atrial fibrillation with rapid ventricular sponsor 140s to 170s.  Original troponin was 4.  Cardizem drip started. - Also started on metoprolol tartrate 25 mg twice a day last night. - He converted to normal sinus rhythm overnight.  Excellent.  No need  for cardioversion obviously. -TSH normal. -I will stop IV diltiazem drip and start him on Cardizem CD 180 milligrams once a day.  I will stop the metoprolol tartrate 25 mg twice a day.  I will start the Eliquis 5 mg twice a day.  Stop IV heparin.  Chest discomfort - May be affiliated with rapid ventricular response.  No elevation in troponin.  Not acute coronary syndrome.  I am going to switch over to Eliquis 5 mg twice a day from IV heparin.  Hypertension - On losartan 100 mg a day. -Stop amlodipine at home since he is now on diltiazem.  Hyperlipidemia - On pravastatin 20 mg a day.  I am comfortable with his discharge home.  We can set him up for outpatient echocardiogram and follow-up atrial fibrillation clinic.    For questions or updates, please contact [place] Please consult [email or web address] for contact info under        Signed, Candee Furbish, MD  06/04/2022, 9:49 AM

## 2022-06-07 ENCOUNTER — Telehealth: Payer: Self-pay

## 2022-06-07 NOTE — Telephone Encounter (Signed)
Transition Care Management Follow-up Telephone Call Date of discharge and from where: [facility] on 06/04/2022 Dx: Atrial Fibrillation How have you been since you were released from the hospital? Per wife: "husband is doing good" Any questions or concerns? No  Items Reviewed: Did the pt receive and understand the discharge instructions provided? Yes  Medications obtained and verified? Yes  Other? No  Any new allergies since your discharge? No  Dietary orders reviewed? Yes; Heart Healthy  Do you have support at home? Yes ; wife  Home Care and Equipment/Supplies: Were home health services ordered? not applicable If so, what is the name of the agency? no  Has the agency set up a time to come to the patient's home? not applicable Were any new equipment or medical supplies ordered?  No What is the name of the medical supply agency? no Were you able to get the supplies/equipment? not applicable Do you have any questions related to the use of the equipment or supplies? No  Functional Questionnaire: (I = Independent and D = Dependent) ADLs: I  Bathing/Dressing- I  Meal Prep- I  Eating- I  Maintaining continence- I  Transferring/Ambulation- I  Managing Meds- I  Follow up appointments reviewed:  PCP Hospital f/u appt confirmed? Yes  Scheduled to see Dr. [name] on 06/12/2022 @ 1:40p. [place] [facility] f/u appt confirmed? Yes  Scheduled to see Cardiology.  Are transportation arrangements needed? No  If their condition worsens, is the pt aware to call PCP or go to the Emergency Dept.? Yes Was the patient provided with contact information for the PCP's office or ED? Yes Was to pt encouraged to call back with questions or concerns? Yes  Donald [name] N. Donald Hafley, LPN. Nurse [place] [facility]  Site: [facility] [place]: [phone] fax

## 2022-06-12 ENCOUNTER — Encounter: Payer: Self-pay | Admitting: Family Medicine

## 2022-06-12 ENCOUNTER — Ambulatory Visit (INDEPENDENT_AMBULATORY_CARE_PROVIDER_SITE_OTHER): Payer: 59 | Admitting: Family Medicine

## 2022-06-12 VITALS — BP 138/84 | HR 62 | Temp 98.2°F | Wt 238.4 lb

## 2022-06-12 DIAGNOSIS — R079 Chest pain, unspecified: Secondary | ICD-10-CM | POA: Diagnosis not present

## 2022-06-12 DIAGNOSIS — I480 Paroxysmal atrial fibrillation: Secondary | ICD-10-CM

## 2022-06-12 DIAGNOSIS — R0689 Other abnormalities of breathing: Secondary | ICD-10-CM | POA: Diagnosis not present

## 2022-06-12 NOTE — Progress Notes (Unsigned)
Subjective:  Patient ID: Donald [name], male    DOB: 1970-11-19  Age: 52 y.o. MRN: [id number]  CC:  Chief Complaint  Patient presents with   Atrial Fibrillation    HPI [name] presents for  Transition of care Face-to-face visit, transition care note reviewed from January 3.  Discharged 06/04/2022, admitted for atrial fibrillation Admitted December 30 through December 31.  Discharge summary reviewed.  Paroxysmal atrial fibrillation Presented with palpitations, noted to be in A-fib with RVR.  Started on heparin drip, Cardizem drip, converted to sinus rhythm overnight then transition to Eliquis and Cardizem CD.  Troponin was 4.  Continued Cardizem outpatient, amlodipine discontinued.  Planned 2D echo as an outpatient, , Normal TSH. CXR no active cardiopulmonary concerns. Ca 8.8, glucose 119 on 12/31.  No sign of acute coronary syndrome with normal troponin (4, then 5), chest discomfort thought to be due to palpitations, A-fib.  He was continued on losartan for hypertension, amlodipine discontinued with initiation of diltiazem and continued on statin for hyperlipidemia.  Appointment in the A-fib clinic January 15.  Cardiology appointment January 29.  Since hospitalization. Still occasional irregular heartbeat, but not fast.  Left sided chest pain and under shoulder blade, no arm radiation. No assoc'd diaphoresis/n/v.  sometimes short of breath - hard to take a breath - feels heavy - similar symptoms in the past over past 1 and 1.5 years.  Pain comes and goes. Lasts few hours, then resolves. Similar pain as in ER.  No preceding calf pain or swelling, no hx of DVT, no recent air travel or prolonged car travel.  No new bleeding with use of Eliquis BID.  Taking diltiazem '120mg'$  qd and losartan '100mg'$  Qd Home BP 145-150/88-90.  HR 62 today.  BP Readings from Last 3 Encounters:  06/12/22 138/84  06/04/22 137/81  05/01/22 118/60       History Patient Active Problem List    Diagnosis Date Noted   Atrial fibrillation with RVR ([place]) 06/03/2022   Atypical chest pain 04/02/2021   HLD (hyperlipidemia) 08/25/2020   Encounter for general adult medical examination with abnormal findings 08/19/2020   Prediabetes 08/19/2020   Screen for colon cancer 08/19/2020   Diuretic-induced hypokalemia 08/19/2020   Chronic bilateral low back pain without sciatica 08/19/2020   Right bundle branch block 03/11/2010   Essential hypertension 05/14/2009   PARASOMNIA 05/14/2009   G E R D 05/13/2009   Past Medical History:  Diagnosis Date   Blood transfusion without reported diagnosis    DEPRESSION, MILD    Dysuria    G E R D    Hypercholesterolemia    Hyperlipidemia    NOT TAKING MEDICATION   Hypertension    Lumbago    NEPHROLITHIASIS, HX OF    PARASOMNIA    Right bundle branch block    Past Surgical History:  Procedure Laterality Date   CARDIAC CATHETERIZATION N/A 01/19/2016   Procedure: Left Heart Cath and Coronary Angiography;  Surgeon: Sherren Mocha, MD;  Location: [facility];  Service: Cardiovascular;  Laterality: N/A;   HAND SURGERY     right and left hand     Allergies  Allergen Reactions   Ace Inhibitors Cough    Lisinopril caused cough   Pork-Derived Products    Prior to Admission medications   Medication Sig Start Date End Date Taking? Authorizing Provider  apixaban (ELIQUIS) 5 MG TABS tablet Take 1 tablet (5 mg total) by mouth 2 (two) times daily. 06/04/22  Yes Elgergawy, Emeline Gins  S, MD  Cholecalciferol (VITAMIN D3) 50 MCG (2000 UT) TABS Take one tablet by mouth daily. 04/25/21  Yes [name], MD  diltiazem (CARDIZEM CD) 120 MG 24 hr capsule Take 1 capsule (120 mg total) by mouth daily. 06/04/22 07/04/22 Yes Elgergawy, Silver Huguenin, MD  losartan (COZAAR) 100 MG tablet Take 1 tablet (100 mg total) by mouth daily. 04/17/22  Yes [name], MD  pravastatin (PRAVACHOL) 20 MG tablet Take 1 tablet (20 mg total) by mouth daily. Initially start 2  days per week, then increase to daily 04/17/22  Yes [name], MD   Social History   Socioeconomic History   Marital status: Married    Spouse name: Not on file   Number of children: 6   Years of education: 15   Highest education level: Not on file  Occupational History   Occupation: Glass blower/designer    Employer: [employer]  Tobacco Use   Smoking status: Former    Types: Cigarettes   Smokeless tobacco: Never  Substance and Sexual Activity   Alcohol use: No    Alcohol/week: 0.0 standard drinks of alcohol   Drug use: No   Sexual activity: Not on file  Other Topics Concern   Not on file  Social History Narrative   ** Merged History Encounter **       Social Determinants of Health   Financial Resource Strain: Not on file  Food Insecurity: No Food Insecurity (06/04/2022)   Hunger Vital Sign    Worried About Running Out of Food in the Last Year: Never true    Ran Out of Food in the Last Year: Never true  Transportation Needs: No Transportation Needs (06/04/2022)   PRAPARE - Hydrologist (Medical): No    Lack of Transportation (Non-Medical): No  Physical Activity: Not on file  Stress: Not on file  Social Connections: Not on file  Intimate Partner Violence: Not At Risk (06/04/2022)   Humiliation, Afraid, Rape, and Kick questionnaire    Fear of Current or Ex-Partner: No    Emotionally Abused: No    Physically Abused: No    Sexually Abused: No    Review of Systems Per HPI  Objective:   Vitals:   06/12/22 1340  BP: 138/84  Pulse: 62  Temp: 98.2 F (36.8 C)  SpO2: 100%  Weight: 238 lb 6.4 oz (108.1 kg)     Physical Exam Vitals reviewed.  Constitutional:      Appearance: He is well-developed.  HENT:     Head: Normocephalic and atraumatic.  Neck:     Vascular: No carotid bruit or JVD.  Cardiovascular:     Rate and Rhythm: Normal rate and regular rhythm.     Heart sounds: Normal heart sounds. No murmur heard.     Comments: Regular rhythm, rate. no current pain, unable to reproduce pain with palpation or cervical spine motion. Pulmonary:     Effort: Pulmonary effort is normal.     Breath sounds: Normal breath sounds. No rales.  Musculoskeletal:     Right lower leg: No edema.     Left lower leg: No edema.  Skin:    General: Skin is warm and dry.  Neurological:     Mental Status: He is alert and oriented to person, place, and time.  Psychiatric:        Mood and Affect: Mood normal.    EKG, sinus rhythm, right bundle branch block.  Rate 66.  Some baseline artifact.  Compared to EKG on 06/04/2022, atrial fibrillation resolved, persistent right bundle branch block.    Assessment & Plan:  Talton Delpriore is a 52 y.o. male . Paroxysmal atrial fibrillation ([place]) - Plan: EKG 12-Lead  Chest pain, unspecified type - Plan: D-Dimer, Quantitative, Comprehensive metabolic panel, EKG 68-TMHD  Difficulty breathing - Plan: D-Dimer, Quantitative, CBC, EKG 12-Lead  Tolerating Eliquis without any bleeding, in sinus rhythm in office.  Borderline elevated blood pressure, but hesitant to change meds based on current level and heart rate.  Has close follow-up with atrial fibrillation clinic as well as cardiology as above.  No med adjustments for now.  Intermittent upper chest, back symptoms.  No acute findings on EKG.  Troponins were negative in hospital.  Similar symptoms for some time, denies new symptoms.  Also discussed intermittent feeling of incomplete breath or difficulty with deep breath but not shortness of breath.  Will check a D-dimer although less likely pulmonary embolus.  He plans to try Tylenol to see if symptoms could be cervical or musculoskeletal related.  Also recommended he discuss the symptoms with cardiology to see if they need him to be seen sooner, but does have close follow-up with A-fib clinic and cardiology as above.  ER precautions were also discussed if new or worsening symptoms.  No  orders of the defined types were placed in this encounter.  Patient Instructions  Continue same medications for now.  Blood pressure is on the higher level of normal but I am hesitant to increase her medications for now as that may lower your heart rate too much.  Keep a record of those blood pressures for your follow-up visit with the atrial fibrillation clinic next week.  I do not see any acute findings on your EKG today.  See information below about chest pain.  If you do have any new or worsening chest pain, call 911 or go to emergency room as we discussed.  I will check a blood clot screening test and if that is elevated can talk about other imaging of your chest.  Again if any new or worsening symptoms be seen in the emergency room.  Keep follow-up with atrial fibrillation clinic next week as planned and cardiology in the next few weeks.  Nonspecific Chest Pain, Adult Chest pain is an uncomfortable, tight, or painful feeling in the chest. The pain can feel like a crushing, aching, or squeezing pressure. A person can feel a burning or tingling sensation. Chest pain can also be felt in your back, neck, jaw, shoulder, or arm. This pain can be worse when you move, sneeze, or take a deep breath. Chest pain can be caused by a condition that is life-threatening. This must be treated right away. It can also be caused by something that is not life-threatening. If you have chest pain, it can be hard to know the difference, so it is important to get help right away to make sure that you do not have a serious condition. Some life-threatening causes of chest pain include: Heart attack. A tear in the body's main blood vessel (aortic dissection). Inflammation around your heart (pericarditis). A problem in the lungs, such as a blood clot (pulmonary embolism) or a collapsed lung (pneumothorax). Some non life-threatening causes of chest pain include: Heartburn. Anxiety or stress. Damage to the bones, muscles,  and cartilage that make up your chest wall. Pneumonia or bronchitis. Shingles infection (varicella-zoster virus). Your chest pain may come and go. It may also be  constant. Your health care provider will do tests and other studies to find the cause of your pain. Treatment will depend on the cause of your chest pain. Follow these instructions at home: Medicines Take over-the-counter and prescription medicines only as told by your health care provider. If you were prescribed an antibiotic medicine, take it as told by your health care provider. Do not stop taking the antibiotic even if you start to feel better. Activity Avoid any activities that cause chest pain. Do not lift anything that is heavier than 10 lb (4.5 kg), or the limit that you are told, until your health care provider says that it is safe. Rest as directed by your health care provider. Return to your normal activities only as told by your health care provider. Ask your health care provider what activities are safe for you. Lifestyle     Do not use any products that contain nicotine or tobacco, such as cigarettes, e-cigarettes, and chewing tobacco. If you need help quitting, ask your health care provider. Do not drink alcohol. Make healthy lifestyle changes as recommended. These may include: Getting regular exercise. Ask your health care provider to suggest some exercises that are safe for you. Eating a heart-healthy diet. This includes plenty of fresh fruits and vegetables, whole grains, low-fat (lean) protein, and low-fat dairy products. A dietitian can help you find healthy eating options. Maintaining a healthy weight. Managing any other health conditions you may have, such as high blood pressure (hypertension) or diabetes. Reducing stress, such as with yoga or relaxation techniques. General instructions Pay attention to any changes in your symptoms. It is up to you to get the results of any tests that were done. Ask your  health care provider, or the department that is doing the tests, when your results will be ready. Keep all follow-up visits as told by your health care provider. This is important. You may be asked to go for further testing if your chest pain does not go away. Contact a health care provider if: Your chest pain does not go away. You feel depressed. You have a fever. You notice changes in your symptoms or develop new symptoms. Get help right away if: Your chest pain gets worse. You have a cough that gets worse, or you cough up blood. You have severe pain in your abdomen. You faint. You have sudden, unexplained chest discomfort. You have sudden, unexplained discomfort in your arms, back, neck, or jaw. You have shortness of breath at any time. You suddenly start to sweat, or your skin gets clammy. You feel nausea or you vomit. You suddenly feel lightheaded or dizzy. You have severe weakness, or unexplained weakness or fatigue. Your heart begins to beat quickly, or it feels like it is skipping beats. These symptoms may represent a serious problem that is an emergency. Do not wait to see if the symptoms will go away. Get medical help right away. Call your local emergency services (911 in the [place] Do not drive yourself to the hospital. Summary Chest pain can be caused by a condition that is serious and requires urgent treatment. It may also be caused by something that is not life-threatening. Your health care provider may do lab tests and other studies to find the cause of your pain. Follow your health care provider's instructions on taking medicines, making lifestyle changes, and getting emergency treatment if symptoms become worse. Keep all follow-up visits as told by your health care provider. This includes visits for any further testing  if your chest pain does not go away. This information is not intended to replace advice given to you by your health care provider. Make sure you discuss any  questions you have with your health care provider. Document Revised: 08/05/2020 Document Reviewed: 08/05/2020 Elsevier Patient Education  [place],   [name], MD [place], [facility] 06/12/22 2:28 PM

## 2022-06-12 NOTE — Patient Instructions (Addendum)
Continue same medications for now.  Blood pressure is on the higher level of normal but I am hesitant to increase her medications for now as that may lower your heart rate too much.  Keep a record of those blood pressures for your follow-up visit with the atrial fibrillation clinic next week.  I do not see any acute findings on your EKG today.  See information below about chest pain.  If you do have any new or worsening chest pain, call 911 or go to emergency room as we discussed.  I will check a blood clot screening test and if that is elevated can talk about other imaging of your chest.  Again if any new or worsening symptoms be seen in the emergency room.  Keep follow-up with atrial fibrillation clinic next week as planned and cardiology in the next few weeks.  Nonspecific Chest Pain, Adult Chest pain is an uncomfortable, tight, or painful feeling in the chest. The pain can feel like a crushing, aching, or squeezing pressure. A person can feel a burning or tingling sensation. Chest pain can also be felt in your back, neck, jaw, shoulder, or arm. This pain can be worse when you move, sneeze, or take a deep breath. Chest pain can be caused by a condition that is life-threatening. This must be treated right away. It can also be caused by something that is not life-threatening. If you have chest pain, it can be hard to know the difference, so it is important to get help right away to make sure that you do not have a serious condition. Some life-threatening causes of chest pain include: Heart attack. A tear in the body's main blood vessel (aortic dissection). Inflammation around your heart (pericarditis). A problem in the lungs, such as a blood clot (pulmonary embolism) or a collapsed lung (pneumothorax). Some non life-threatening causes of chest pain include: Heartburn. Anxiety or stress. Damage to the bones, muscles, and cartilage that make up your chest wall. Pneumonia or bronchitis. Shingles  infection (varicella-zoster virus). Your chest pain may come and go. It may also be constant. Your health care provider will do tests and other studies to find the cause of your pain. Treatment will depend on the cause of your chest pain. Follow these instructions at home: Medicines Take over-the-counter and prescription medicines only as told by your health care provider. If you were prescribed an antibiotic medicine, take it as told by your health care provider. Do not stop taking the antibiotic even if you start to feel better. Activity Avoid any activities that cause chest pain. Do not lift anything that is heavier than 10 lb (4.5 kg), or the limit that you are told, until your health care provider says that it is safe. Rest as directed by your health care provider. Return to your normal activities only as told by your health care provider. Ask your health care provider what activities are safe for you. Lifestyle     Do not use any products that contain nicotine or tobacco, such as cigarettes, e-cigarettes, and chewing tobacco. If you need help quitting, ask your health care provider. Do not drink alcohol. Make healthy lifestyle changes as recommended. These may include: Getting regular exercise. Ask your health care provider to suggest some exercises that are safe for you. Eating a heart-healthy diet. This includes plenty of fresh fruits and vegetables, whole grains, low-fat (lean) protein, and low-fat dairy products. A dietitian can help you find healthy eating options. Maintaining a healthy weight. Managing  any other health conditions you may have, such as high blood pressure (hypertension) or diabetes. Reducing stress, such as with yoga or relaxation techniques. General instructions Pay attention to any changes in your symptoms. It is up to you to get the results of any tests that were done. Ask your health care provider, or the department that is doing the tests, when your results  will be ready. Keep all follow-up visits as told by your health care provider. This is important. You may be asked to go for further testing if your chest pain does not go away. Contact a health care provider if: Your chest pain does not go away. You feel depressed. You have a fever. You notice changes in your symptoms or develop new symptoms. Get help right away if: Your chest pain gets worse. You have a cough that gets worse, or you cough up blood. You have severe pain in your abdomen. You faint. You have sudden, unexplained chest discomfort. You have sudden, unexplained discomfort in your arms, back, neck, or jaw. You have shortness of breath at any time. You suddenly start to sweat, or your skin gets clammy. You feel nausea or you vomit. You suddenly feel lightheaded or dizzy. You have severe weakness, or unexplained weakness or fatigue. Your heart begins to beat quickly, or it feels like it is skipping beats. These symptoms may represent a serious problem that is an emergency. Do not wait to see if the symptoms will go away. Get medical help right away. Call your local emergency services (911 in the [place] Do not drive yourself to the hospital. Summary Chest pain can be caused by a condition that is serious and requires urgent treatment. It may also be caused by something that is not life-threatening. Your health care provider may do lab tests and other studies to find the cause of your pain. Follow your health care provider's instructions on taking medicines, making lifestyle changes, and getting emergency treatment if symptoms become worse. Keep all follow-up visits as told by your health care provider. This includes visits for any further testing if your chest pain does not go away. This information is not intended to replace advice given to you by your health care provider. Make sure you discuss any questions you have with your health care provider. Document Revised: 08/05/2020  Document Reviewed: 08/05/2020 Elsevier Patient Education  [place].

## 2022-06-13 LAB — D-DIMER, QUANTITATIVE: D-Dimer, Quant: <0.19 mcg/mL FEU (ref <0.50)

## 2022-06-13 LAB — CBC
HCT: 43.8 % (ref 39.0–52.0)
Hemoglobin: 14.6 g/dL (ref 13.0–17.0)
MCHC: 33.4 g/dL (ref 30.0–36.0)
MCV: 84.9 fl (ref 78.0–100.0)
Platelets: 335 10*3/uL (ref 150.0–400.0)
RBC: 5.16 Mil/uL (ref 4.22–5.81)
RDW: 14.6 % (ref 11.5–15.5)
WBC: 4.5 10*3/uL (ref 4.0–10.5)

## 2022-06-13 LAB — COMPREHENSIVE METABOLIC PANEL
ALT: 12 U/L (ref 0–53)
AST: 14 U/L (ref 0–37)
Albumin: 4.5 g/dL (ref 3.5–5.2)
Alkaline Phosphatase: 47 U/L (ref 39–117)
BUN: 13 mg/dL (ref 6–23)
CO2: 28 mEq/L (ref 19–32)
Calcium: 9.6 mg/dL (ref 8.4–10.5)
Chloride: 103 mEq/L (ref 96–112)
Creatinine, Ser: 0.94 mg/dL (ref 0.40–1.50)
GFR: 92.26 mL/min (ref >60.00)
Glucose, Bld: 85 mg/dL (ref 70–99)
Potassium: 4.2 mEq/L (ref 3.5–5.1)
Sodium: 140 mEq/L (ref 135–145)
Total Bilirubin: 0.5 mg/dL (ref 0.2–1.2)
Total Protein: 8.4 g/dL — ABNORMAL HIGH (ref 6.0–8.3)

## 2022-06-19 ENCOUNTER — Encounter (HOSPITAL_COMMUNITY): Payer: Self-pay | Admitting: Nurse Practitioner

## 2022-06-19 ENCOUNTER — Inpatient Hospital Stay (HOSPITAL_COMMUNITY)
Admission: RE | Admit: 2022-06-19 | Discharge: 2022-06-19 | Disposition: A | Discharge status: Home / Self Care | Payer: No Typology Code available for payment source | Source: Ambulatory Visit | Attending: Nurse Practitioner | Admitting: Nurse Practitioner

## 2022-06-19 ENCOUNTER — Ambulatory Visit (HOSPITAL_COMMUNITY)
Admission: RE | Admit: 2022-06-19 | Discharge: 2022-06-19 | Disposition: A | Discharge status: Home / Self Care | Payer: No Typology Code available for payment source | Source: Ambulatory Visit | Attending: Nurse Practitioner | Admitting: Nurse Practitioner

## 2022-06-19 VITALS — BP 148/84 | HR 70 | Ht 74.0 in | Wt 239.6 lb

## 2022-06-19 DIAGNOSIS — I4510 Unspecified right bundle-branch block: Secondary | ICD-10-CM | POA: Diagnosis not present

## 2022-06-19 DIAGNOSIS — I4891 Unspecified atrial fibrillation: Secondary | ICD-10-CM | POA: Insufficient documentation

## 2022-06-19 DIAGNOSIS — Z79899 Other long term (current) drug therapy: Secondary | ICD-10-CM | POA: Diagnosis not present

## 2022-06-19 DIAGNOSIS — I480 Paroxysmal atrial fibrillation: Secondary | ICD-10-CM | POA: Diagnosis not present

## 2022-06-19 DIAGNOSIS — Z7901 Long term (current) use of anticoagulants: Secondary | ICD-10-CM | POA: Diagnosis not present

## 2022-06-19 NOTE — Progress Notes (Addendum)
Primary Care Physician: Donald Agreste, MD Referring Physician: ED f/u  Cardiologist: Dr. Reynold Bowen [name] is a 52 y.o. male with a h/o  essential hypertension, non obstructive CAD,hyperlipidemia, right bundle branch block, who is admitted to [facility] on 06/03/2022 with atrial fibrillation with RVR after presenting from home to [facility] ED complaining of palpitations.  He was started on heparin drip, Cardizem drip, he converted to sinus rhythm overnight, he was transitioned to Eliquis and Cardizem CD.   He was referred to the Afib clinic for ED f/u. EKG shows NSR today. He feels he may have on  occasion short spells  fast heart rate. He denies smoking alcohol, possible some snoring. He is on diltiazem 120 mg daily as well as eliquis 5 mg bid. He is pending an echo.   Today, he denies symptoms of palpitations, chest pain, shortness of breath, orthopnea, PND, lower extremity edema, dizziness, presyncope, syncope, or neurologic sequela. The patient is tolerating medications without difficulties and is otherwise without complaint today.   Past Medical History:  Diagnosis Date   Blood transfusion without reported diagnosis    DEPRESSION, MILD    Dysuria    G E R D    Hypercholesterolemia    Hyperlipidemia    NOT TAKING MEDICATION   Hypertension    Lumbago    NEPHROLITHIASIS, HX OF    PARASOMNIA    Right bundle branch block    Past Surgical History:  Procedure Laterality Date   CARDIAC CATHETERIZATION N/A 01/19/2016   Procedure: Left Heart Cath and Coronary Angiography;  Surgeon: Sherren Mocha, MD;  Location: [facility];  Service: Cardiovascular;  Laterality: N/A;   HAND SURGERY     right and left hand      Current Outpatient Medications  Medication Sig Dispense Refill   apixaban (ELIQUIS) 5 MG TABS tablet Take 1 tablet (5 mg total) by mouth 2 (two) times daily. 60 tablet 0   Cholecalciferol (VITAMIN D3) 50 MCG (2000 UT) TABS Take one tablet by mouth  daily. 90 tablet 1   diltiazem (CARDIZEM CD) 120 MG 24 hr capsule Take 1 capsule (120 mg total) by mouth daily. 30 capsule 0   losartan (COZAAR) 100 MG tablet Take 1 tablet (100 mg total) by mouth daily. 90 tablet 1   pravastatin (PRAVACHOL) 20 MG tablet Take 1 tablet (20 mg total) by mouth daily. Initially start 2 days per week, then increase to daily 90 tablet 1   No current facility-administered medications for this encounter.    Allergies  Allergen Reactions   Ace Inhibitors Cough    Lisinopril caused cough   Pork-Derived Products     Social History   Socioeconomic History   Marital status: Married    Spouse name: Not on file   Number of children: 6   Years of education: 15   Highest education level: Not on file  Occupational History   Occupation: Glass blower/designer    Employer: [employer]  Tobacco Use   Smoking status: Former    Types: Cigarettes   Smokeless tobacco: Never  Substance and Sexual Activity   Alcohol use: No    Alcohol/week: 0.0 standard drinks of alcohol   Drug use: No   Sexual activity: Not on file  Other Topics Concern   Not on file  Social History Narrative   ** Merged History Encounter **       Social Determinants of Health   Financial  Resource Strain: Not on file  Food Insecurity: No Food Insecurity (06/04/2022)   Hunger Vital Sign    Worried About Running Out of Food in the Last Year: Never true    Ran Out of Food in the Last Year: Never true  Transportation Needs: No Transportation Needs (06/04/2022)   PRAPARE - Hydrologist (Medical): No    Lack of Transportation (Non-Medical): No  Physical Activity: Not on file  Stress: Not on file  Social Connections: Not on file  Intimate Partner Violence: Not At Risk (06/04/2022)   Humiliation, Afraid, Rape, and Kick questionnaire    Fear of Current or Ex-Partner: No    Emotionally Abused: No    Physically Abused: No    Sexually Abused: No    Family History   Problem Relation Age of Onset   Hypertension Mother    Hypertension Sister     ROS- All systems are reviewed and negative except as per the HPI above  Physical Exam: Vitals:   06/19/22 0859  Height: '6\' 2"'$  (1.88 m)   Wt Readings from Last 3 Encounters:  06/12/22 108.1 kg  06/04/22 107.6 kg  05/01/22 110.3 kg    Labs: Lab Results  Component Value Date   NA 140 06/12/2022   K 4.2 06/12/2022   CL 103 06/12/2022   CO2 28 06/12/2022   GLUCOSE 85 06/12/2022   BUN 13 06/12/2022   CREATININE 0.94 06/12/2022   CALCIUM 9.6 06/12/2022   MG 1.9 06/04/2022   Lab Results  Component Value Date   INR 1.0 04/02/2021   Lab Results  Component Value Date   CHOL 190 05/01/2022   HDL 49.00 05/01/2022   LDLCALC 126 (H) 05/01/2022   TRIG 76.0 05/01/2022     GEN- The patient is well appearing, alert and oriented x 3 today.   Head- normocephalic, atraumatic Eyes-  Sclera clear, conjunctiva pink Ears- hearing intact Oropharynx- clear Neck- supple, no JVP Lymph- no cervical lymphadenopathy Lungs- Clear to ausculation bilaterally, normal work of breathing Heart- Regular rate and rhythm, no murmurs, rubs or gallops, PMI not laterally displaced GI- soft, NT, ND, + BS Extremities- no clubbing, cyanosis, or edema MS- no significant deformity or atrophy Skin- no rash or lesion Psych- euthymic mood, full affect Neuro- strength and sensation are intact  EKG- Normal sinus rhythm with RBBB at 70 bpm, pr int 180 ms, qrs int 168 ms, qtc 470 ms     Assessment and Plan:  1. New onset afib General education re afib and triggers He will continue diltiazem 120 mg daily 2 week zio patch Echo pending   2. CHA2DS2VASc  score of  1 Continue eliquis 5 mg bid for now  Bleeding precautions discussed   F/u with Dr. Johnsie Cancel 1/29  F/u here will depend on results of monitor   Butch Penny C. Maiana Hennigan, [facility] [address] [place], [place] [address] [phone]      2.

## 2022-06-20 NOTE — Progress Notes (Signed)
CARDIOLOGY CONSULT NOTE       Patient ID: Donald [name] MRN: [id number] DOB/AGE: [birth year] 54 y.o.  Referring Physician: Carlota Raspberry Primary Physician: [name], MD Primary Cardiologist: Johnsie Cancel   HPI:  52 y.o. first seen January 2021 for chest pain.  History of GERD, HLD, HTN and RBBB. Pain atypical. Had non obstructive cath by Dr Burt Knack in August 2017 with only 40% Ramus disease   He is from [name]. 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 5 children at home   Myovue done 07/11/19 normal no ischemia EF 65%   Seen in ED 09/01/20 for abdominal pain  RUQ [place] negative and has non obstructive renal calculi Labs ok F/U MRI abdomen showed hemorrhagic/proteinaceous cyst in left kidney   Admitted to [facility] 06/03/22 with palpitations and PAF. Converted with iv cardizem and d/c with Cardizem CD and Eliquis [name] only 1 Not tolerating cardizem makes him feel foggy/tired F/U 14 day Zio pending   TTE:  07/03/2022 reviewed no bad valves normal EF LA 38 mm     ROS All other systems reviewed and negative except as noted above  Past Medical History:  Diagnosis Date   Blood transfusion without reported diagnosis    DEPRESSION, MILD    Dysuria    G E R D    Hypercholesterolemia    Hyperlipidemia    NOT TAKING MEDICATION   Hypertension    Lumbago    NEPHROLITHIASIS, HX OF    PARASOMNIA    Right bundle branch block     Family History  Problem Relation Age of Onset   Hypertension Mother    Hypertension Sister     Social History   Socioeconomic History   Marital status: Married    Spouse name: Not on file   Number of children: 6   Years of education: 15   Highest education level: Not on file  Occupational History   Occupation: Glass blower/designer    Employer: [employer]  Tobacco Use   Smoking status: Former    Types: Cigarettes   Smokeless tobacco: Never  Substance and Sexual Activity   Alcohol use: No    Alcohol/week: 0.0  standard drinks of alcohol   Drug use: No   Sexual activity: Not on file  Other Topics Concern   Not on file  Social History Narrative   ** Merged History Encounter **       Social Determinants of Health   Financial Resource Strain: Not on file  Food Insecurity: No Food Insecurity (06/04/2022)   Hunger Vital Sign    Worried About Running Out of Food in the Last Year: Never true    Ran Out of Food in the Last Year: Never true  Transportation Needs: No Transportation Needs (06/04/2022)   PRAPARE - Hydrologist (Medical): No    Lack of Transportation (Non-Medical): No  Physical Activity: Not on file  Stress: Not on file  Social Connections: Not on file  Intimate Partner Violence: Not At Risk (06/04/2022)   Humiliation, Afraid, Rape, and Kick questionnaire    Fear of Current or Ex-Partner: No    Emotionally Abused: No    Physically Abused: No    Sexually Abused: No    Past Surgical History:  Procedure Laterality Date   CARDIAC CATHETERIZATION N/A 01/19/2016   Procedure: Left Heart Cath and Coronary Angiography;  Surgeon: Sherren Mocha, MD;  Location: [facility];  Service: Cardiovascular;  Laterality: N/A;   HAND SURGERY     right and left hand        Current Outpatient Medications:    apixaban (ELIQUIS) 5 MG TABS tablet, Take 1 tablet (5 mg total) by mouth 2 (two) times daily., Disp: 60 tablet, Rfl: 0   Cholecalciferol (VITAMIN D3) 50 MCG (2000 UT) TABS, Take one tablet by mouth daily., Disp: 90 tablet, Rfl: 1   diltiazem (CARDIZEM CD) 120 MG 24 hr capsule, Take 1 capsule (120 mg total) by mouth daily., Disp: 30 capsule, Rfl: 0   losartan (COZAAR) 100 MG tablet, Take 1 tablet (100 mg total) by mouth daily., Disp: 90 tablet, Rfl: 1   pravastatin (PRAVACHOL) 20 MG tablet, Take 1 tablet (20 mg total) by mouth daily. Initially start 2 days per week, then increase to daily, Disp: 90 tablet, Rfl: 1    Physical Exam: There were no vitals taken  for this visit.    Affect appropriate Healthy:  appears stated age 73: normal Neck supple with no adenopathy JVP normal no bruits no thyromegaly Lungs clear with no wheezing and good diaphragmatic motion Heart:  S1/S2 no murmur, no rub, gallop or click PMI normal Abdomen: benighn, BS positve, no tenderness, no AAA no bruit.  No HSM or HJR Distal pulses intact with no bruits No edema Neuro non-focal Skin warm and dry No muscular weakness    Labs:   Lab Results  Component Value Date   WBC 4.5 06/12/2022   HGB 14.6 06/12/2022   HCT 43.8 06/12/2022   MCV 84.9 06/12/2022   PLT 335.0 06/12/2022   No results for input(s): "NA", "K", "CL", "CO2", "BUN", "CREATININE", "CALCIUM", "PROT", "BILITOT", "ALKPHOS", "ALT", "AST", "GLUCOSE" in the last 168 hours.  Invalid input(s): "LABALBU" Lab Results  Component Value Date   CKTOTAL 191 06/18/2019   CKMB 3.6 01/19/2016   TROPONINI <0.03 09/23/2018    Lab Results  Component Value Date   CHOL 190 05/01/2022   CHOL 223 (H) 10/24/2021   CHOL 145 04/25/2021   Lab Results  Component Value Date   HDL 49.00 05/01/2022   HDL 51.50 10/24/2021   HDL 50.00 04/25/2021   Lab Results  Component Value Date   LDLCALC 126 (H) 05/01/2022   LDLCALC 159 (H) 10/24/2021   LDLCALC 81 04/25/2021   Lab Results  Component Value Date   TRIG 76.0 05/01/2022   TRIG 63.0 10/24/2021   TRIG 68.0 04/25/2021   Lab Results  Component Value Date   CHOLHDL 4 05/01/2022   CHOLHDL 4 10/24/2021   CHOLHDL 3 04/25/2021   No results found for: "LDLDIRECT"    Radiology: DG Chest 2 View  Result Date: 06/03/2022 CLINICAL DATA:  Irregular heartbeat chest pain EXAM: CHEST - 2 VIEW COMPARISON:  09/08/2021 FINDINGS: The heart size and mediastinal contours are within normal limits. Both lungs are clear. The visualized skeletal structures are unremarkable. IMPRESSION: No active cardiopulmonary disease. Electronically Signed   By: Donavan Foil M.D.   On:  06/03/2022 21:21    EKG: NSR rate 83 normal 06/29/19   ASSESSMENT AND PLAN:    1. Chest Pain:  Atypical normal myovue 07/11/19 40% RAMUS at cath in 2017 continue medical Rx  2. HTN:  Well controlled continue low sodium DASH diet and current meds  3. HLD:  On statin labs with primary  4. GI:  Abdominal pain RUQ [place] normal 09/02/20   MRI abdomen with hemorrhagic cyst improved  6. PAF:  06/03/22  CHADVASC 1 On cardizem and eliquis D/c cardizem not tolerating start verapamil 120 mg daily instead    F/U cardiology in 6-8 weeks to go over monitor and assess med changes   Signed: Jenkins Rouge 07/03/2022, 9:14 AM

## 2022-07-03 ENCOUNTER — Encounter: Payer: Self-pay | Admitting: Cardiovascular Disease

## 2022-07-03 ENCOUNTER — Ambulatory Visit
Payer: No Typology Code available for payment source | Attending: Cardiovascular Disease | Admitting: Cardiovascular Disease

## 2022-07-03 ENCOUNTER — Other Ambulatory Visit: Payer: Self-pay

## 2022-07-03 ENCOUNTER — Ambulatory Visit (HOSPITAL_COMMUNITY): Payer: No Typology Code available for payment source | Attending: Cardiology

## 2022-07-03 VITALS — BP 144/86 | HR 80 | Ht 74.0 in | Wt 235.0 lb

## 2022-07-03 DIAGNOSIS — I4891 Unspecified atrial fibrillation: Secondary | ICD-10-CM | POA: Diagnosis not present

## 2022-07-03 DIAGNOSIS — E782 Mixed hyperlipidemia: Secondary | ICD-10-CM | POA: Diagnosis not present

## 2022-07-03 DIAGNOSIS — Z7901 Long term (current) use of anticoagulants: Secondary | ICD-10-CM | POA: Diagnosis not present

## 2022-07-03 DIAGNOSIS — I480 Paroxysmal atrial fibrillation: Secondary | ICD-10-CM

## 2022-07-03 DIAGNOSIS — I10 Essential (primary) hypertension: Secondary | ICD-10-CM

## 2022-07-03 LAB — ECHOCARDIOGRAM COMPLETE
Area-P 1/2: 3.39 cm2
S' Lateral: 3.3 cm

## 2022-07-03 MED ORDER — VERAPAMIL HCL ER 120 MG PO TBCR: 120.0000 mg | EXTENDED_RELEASE_TABLET | Freq: Every day | ORAL | 3 refills | Status: DC

## 2022-07-03 MED ORDER — APIXABAN 5 MG PO TABS: 5.0000 mg | ORAL_TABLET | Freq: Two times a day (BID) | ORAL | 5 refills | Status: DC

## 2022-07-03 NOTE — Telephone Encounter (Signed)
Prescription refill request for Eliquis received. Indication:afib Last office visit:1/24 Scr:0.9 Age: 52 Weight:106.6  kg  Prescription refilled

## 2022-07-03 NOTE — Patient Instructions (Addendum)
Medication Instructions:  Your physician has recommended you make the following change in your medication:  1-STOP diltiazem  2-START Verapamil 120 mg by mouth daily.  *If you need a refill on your cardiac medications before your next appointment, please call your pharmacy*  Lab Work: If you have labs (blood work) drawn today and your tests are completely normal, you will receive your results only by: [place] (if you have MyChart) OR A paper copy in the mail If you have any lab test that is abnormal or we need to change your treatment, we will call you to review the results.  Testing/Procedures: None ordered today.  Follow-Up: At [facility], you and your health needs are our priority.  As part of our continuing mission to provide you with exceptional heart care, we have created designated Provider Care Teams.  These Care Teams include your primary Cardiologist (physician) and Advanced Practice Providers (APPs -  Physician Assistants and Nurse Practitioners) who all work together to provide you with the care you need, when you need it.  We recommend signing up for the patient portal called "MyChart".  Sign up information is provided on this After Visit Summary.  MyChart is used to connect with patients for Virtual Visits (Telemedicine).  Patients are able to view lab/test results, encounter notes, upcoming appointments, etc.  Non-urgent messages can be sent to your provider as well.   To learn more about what you can do with MyChart, go to [email or web address].    Your next appointment:   6 months  Provider:   Jenkins Rouge, MD     You have been referred to Blood Pressure Clinic in 6 to 8 weeks.

## 2022-07-10 ENCOUNTER — Encounter: Payer: Self-pay | Admitting: Family Medicine

## 2022-07-10 ENCOUNTER — Ambulatory Visit (INDEPENDENT_AMBULATORY_CARE_PROVIDER_SITE_OTHER): Payer: No Typology Code available for payment source | Admitting: Family Medicine

## 2022-07-10 VITALS — BP 130/72 | HR 65 | Temp 97.8°F | Ht 74.0 in | Wt 237.8 lb

## 2022-07-10 DIAGNOSIS — M542 Cervicalgia: Secondary | ICD-10-CM | POA: Diagnosis not present

## 2022-07-10 DIAGNOSIS — I480 Paroxysmal atrial fibrillation: Secondary | ICD-10-CM | POA: Diagnosis not present

## 2022-07-10 DIAGNOSIS — E785 Hyperlipidemia, unspecified: Secondary | ICD-10-CM

## 2022-07-10 DIAGNOSIS — I10 Essential (primary) hypertension: Secondary | ICD-10-CM | POA: Diagnosis not present

## 2022-07-10 DIAGNOSIS — R779 Abnormality of plasma protein, unspecified: Secondary | ICD-10-CM

## 2022-07-10 HISTORY — DX: Paroxysmal atrial fibrillation: I48.0

## 2022-07-10 MED ORDER — PRAVASTATIN SODIUM 20 MG PO TABS: 20.0000 mg | ORAL_TABLET | Freq: Every day | ORAL | 1 refills | Status: DC

## 2022-07-10 MED ORDER — LOSARTAN POTASSIUM 100 MG PO TABS: 100.0000 mg | ORAL_TABLET | Freq: Every day | ORAL | 1 refills | Status: DC

## 2022-07-10 NOTE — Progress Notes (Signed)
Subjective:  Patient ID: Donald [name], male    DOB: January 23, 1971  Age: 52 y.o. MRN: [id number]  CC:  Chief Complaint  Patient presents with   Hyperlipidemia   Hypertension    HPI Donald [name] presents for   Hypertension: With history of A-fib/RVR noted in December.  Anticoagulation with Eliquis, rate control with diltiazem, and losartan for hypertension (previously on amlodipine, discontinued with diltiazem).  Last visit with me January 8, still episodic chest discomfort.  Of note he had normal troponins in the hospital and discomfort was thought to be due to the palpitations and A-fib.  D-dimer was negative at our last visit.   Cardiology appointment reviewed from January 29.  Noted to be feeling groggy, intolerant to Cardizem.  Zio patch was pending.  Echo January 29 with normal EF (60-65%) and no concerns on valves.  Mildly dilated left atrium which goes along with atrial fibrillation.  Diltiazem discontinued, started verapamil 120 mg daily.  Atypical chest pain, normal Myoview in 2021 with 40% ramus cath in 2017, plan to continue medical treatment.  6 month follow-up.  Feels better on verapamil.  No new bleeding on Eliquis.  No other new side effects.  Prior CP has improved. No dyspnea.   Home readings: variable - 144/83, 150 at times.  BP Readings from Last 3 Encounters:  07/10/22 130/72  07/03/22 (!) 144/86  06/19/22 (!) 148/84   Lab Results  Component Value Date   CREATININE 0.94 06/12/2022   Hyperlipidemia: Pravastatin '20mg'$  qd. Muscle aches better - doing ok on this med.  Lab Results  Component Value Date   CHOL 190 05/01/2022   HDL 49.00 05/01/2022   LDLCALC 126 (H) 05/01/2022   TRIG 76.0 05/01/2022   CHOLHDL 4 05/01/2022   Lab Results  Component Value Date   ALT 12 06/12/2022   AST 14 06/12/2022   ALKPHOS 47 06/12/2022   BILITOT 0.5 06/12/2022   Left neck pain: Last 2 weeks, no known injury or new activities, left neck muscles, moves up towards  scalp with numb feeling on skin at times.  No weakness.  No rash. No treatments.  Elevated total protein: Borderline at 8.4 on 06/12/22 labs. Recheck today. No supplements.   History Patient Active Problem List   Diagnosis Date Noted   Paroxysmal atrial fibrillation ([place]) 07/10/2022   Atrial fibrillation with RVR ([place]) 06/03/2022   Atypical chest pain 04/02/2021   HLD (hyperlipidemia) 08/25/2020   Encounter for general adult medical examination with abnormal findings 08/19/2020   Prediabetes 08/19/2020   Screen for colon cancer 08/19/2020   Diuretic-induced hypokalemia 08/19/2020   Chronic bilateral low back pain without sciatica 08/19/2020   Right bundle branch block 03/11/2010   Essential hypertension 05/14/2009   PARASOMNIA 05/14/2009   G E R D 05/13/2009    Past Medical History:  Diagnosis Date   Blood transfusion without reported diagnosis    DEPRESSION, MILD    Dysuria    G E R D    Hypercholesterolemia    Hyperlipidemia    NOT TAKING MEDICATION   Hypertension    Lumbago    NEPHROLITHIASIS, HX OF    PARASOMNIA    Right bundle branch block       Review of Systems Per HPI  Objective:   Vitals:   07/10/22 1013  BP: 130/72  Pulse: 65  Temp: 97.8 F (36.6 C)  TempSrc: Temporal  SpO2: 100%  Weight: 237 lb 12.8 oz (107.9 kg)  Height: '6\' 2"'$  (1.88  m)     Physical Exam Vitals reviewed.  Constitutional:      Appearance: He is well-developed.  HENT:     Head: Normocephalic and atraumatic.  Neck:     Vascular: No carotid bruit or JVD.  Cardiovascular:     Rate and Rhythm: Normal rate and regular rhythm.     Heart sounds: Normal heart sounds. No murmur heard. Pulmonary:     Effort: Pulmonary effort is normal.     Breath sounds: Normal breath sounds. No rales.  Musculoskeletal:     Right lower leg: No edema.     Left lower leg: No edema.     Comments: C-spine, pain-free range of motion, no midline bony tenderness.  Locates area of discomfort at the left  paraspinals at the cervical spine, no rash.  Scalp nontender.  Skin:    General: Skin is warm and dry.  Neurological:     Mental Status: He is alert and oriented to person, place, and time.  Psychiatric:        Mood and Affect: Mood normal.     Assessment & Plan:  Donald [name] is a 52 y.o. male . Paroxysmal atrial fibrillation (HCC) Assessment & Plan: Rate controlled with verapamil, as above feels better on this medicine versus diltiazem.  Anticoagulated with Eliquis without new bleeding, continue same and follow-up with specialist as planned.   Hyperlipidemia, unspecified hyperlipidemia type Assessment & Plan: Tolerating current dose pravastatin, continue same.  Recheck labs next few months.  Orders: -     Pravastatin Sodium; Take 1 tablet (20 mg total) by mouth daily.  Dispense: 90 tablet; Refill: 1  Neck pain on left side  -Paraspinal muscle area of discomfort but some radiating pain, occipital neuralgia in differential but recent symptoms.  Symptomatic care with heat, gentle range of motion with RTC precautions discussed if persistent.  Essential hypertension Assessment & Plan: Stable control with change to verapamil, symptomatically feels better as well.  Continue same dosing, no change in other meds.  Orders: -     Losartan Potassium; Take 1 tablet (100 mg total) by mouth daily.  Dispense: 90 tablet; Refill: 1 -     Comprehensive metabolic panel  Elevated serum protein level  -Borderline, check CMP.  Further testing depending on results.   Patient Instructions  See info on checking blood pressure. If you continue to have high readings schedule a visit with me with your meter and we can make sure it is accurate.  Glad to hear the new blood pressure medicine is tolerated better and that you are feeling better.  If any concerns on labs I will let you know.  Protein was only borderline elevated previously.  Okay to apply some warm compresses or warm shower/bath for  the neck pain.  Tylenol if needed.  Follow-up in the next few weeks if that is not improving or any worsening symptoms sooner.  No med changes today.  Take care.   How to Take Your Blood Pressure Blood pressure is a measurement of how strongly your blood is pressing against the walls of your arteries. Arteries are blood vessels that carry blood from your heart throughout your body. Your health care provider takes your blood pressure at each office visit. You can also take your own blood pressure at home with a blood pressure monitor. You may need to take your own blood pressure to: Confirm a diagnosis of high blood pressure (hypertension). Monitor your blood pressure over time. Make sure your blood  pressure medicine is working. Supplies needed: Blood pressure monitor. A chair to sit in. This should be a chair where you can sit upright with your back supported. Do not sit on a soft couch or an armchair. Table or desk. Small notebook and pencil or pen. How to prepare To get the most accurate reading, avoid the following for 30 minutes before you check your blood pressure: Drinking caffeine. Drinking alcohol. Eating. Smoking. Exercising. Five minutes before you check your blood pressure: Use the bathroom and urinate so that you have an empty bladder. Sit quietly in a chair. Do not talk. How to take your blood pressure To check your blood pressure, follow the instructions in the manual that came with your blood pressure monitor. If you have a digital blood pressure monitor, the instructions may be as follows: Sit up straight in a chair. Place your feet on the floor. Do not cross your ankles or legs. Rest your left arm at the level of your heart on a table or desk or on the arm of a chair. Pull up your shirt sleeve. Wrap the blood pressure cuff around the upper part of your left arm, 1 inch (2.5 cm) above your elbow. It is best to wrap the cuff around bare skin. Fit the cuff snugly, but  not too tightly, around your arm. You should be able to place only one finger between the cuff and your arm. Position the cord so that it rests in the bend of your elbow. Press the power button. Sit quietly while the cuff inflates and deflates. Read the digital reading on the monitor screen and write the numbers down (record them) in a notebook. Wait 2-3 minutes, then repeat the steps, starting at step 1. What does my blood pressure reading mean? A blood pressure reading consists of a higher number over a lower number. Ideally, your blood pressure should be below 120/80. The first ("top") number is called the systolic pressure. It is a measure of the pressure in your arteries as your heart beats. The second ("bottom") number is called the diastolic pressure. It is a measure of the pressure in your arteries as the heart relaxes. Blood pressure is classified into four stages. The following are the stages for adults who do not have a short-term serious illness or a chronic condition. Systolic pressure and diastolic pressure are measured in a unit called mm Hg (millimeters of mercury).  Normal Systolic pressure: below 270. Diastolic pressure: below 80. Elevated Systolic pressure: 623-762. Diastolic pressure: below 80. Hypertension stage 1 Systolic pressure: 831-517. Diastolic pressure: 61-60. Hypertension stage 2 Systolic pressure: 737 or above. Diastolic pressure: 90 or above. You can have elevated blood pressure or hypertension even if only the systolic or only the diastolic number in your reading is higher than normal. Follow these instructions at home: Medicines Take over-the-counter and prescription medicines only as told by your health care provider. Tell your health care provider if you are having any side effects from blood pressure medicine. General instructions Check your blood pressure as often as recommended by your health care provider. Check your blood pressure at the same time  every day. Take your monitor to the next appointment with your health care provider to make sure that: You are using it correctly. It provides accurate readings. Understand what your goal blood pressure numbers are. Keep all follow-up visits. This is important. General tips Your health care provider can suggest a reliable monitor that will meet your needs. There are several types  of home blood pressure monitors. Choose a monitor that has an arm cuff. Do not choose a monitor that measures your blood pressure from your wrist or finger. Choose a cuff that wraps snugly, not too tight or too loose, around your upper arm. You should be able to fit only one finger between your arm and the cuff. You can buy a blood pressure monitor at most drugstores or online. Where to find more information American Heart Association: www.heart.org Contact a health care provider if: Your blood pressure is consistently high. Your blood pressure is suddenly low. Get help right away if: Your systolic blood pressure is higher than 180. Your diastolic blood pressure is higher than 120. These symptoms may be an emergency. Get help right away. Call 911. Do not wait to see if the symptoms will go away. Do not drive yourself to the hospital. Summary Blood pressure is a measurement of how strongly your blood is pressing against the walls of your arteries. A blood pressure reading consists of a higher number over a lower number. Ideally, your blood pressure should be below 120/80. Check your blood pressure at the same time every day. Avoid caffeine, alcohol, smoking, and exercise for 30 minutes prior to checking your blood pressure. These agents can affect the accuracy of the blood pressure reading. This information is not intended to replace advice given to you by your health care provider. Make sure you discuss any questions you have with your health care provider. Document Revised: 02/03/2021 Document Reviewed:  02/03/2021 Elsevier Patient Education  2023 [place],   [name], MD [facility] 07/10/22 11:07 AM

## 2022-07-10 NOTE — Assessment & Plan Note (Signed)
Rate controlled with verapamil, as above feels better on this medicine versus diltiazem.  Anticoagulated with Eliquis without new bleeding, continue same and follow-up with specialist as planned.

## 2022-07-10 NOTE — Patient Instructions (Addendum)
See info on checking blood pressure. If you continue to have high readings schedule a visit with me with your meter and we can make sure it is accurate.  Glad to hear the new blood pressure medicine is tolerated better and that you are feeling better.  If any concerns on labs I will let you know.  Protein was only borderline elevated previously.  Okay to apply some warm compresses or warm shower/bath for the neck pain.  Tylenol if needed.  Follow-up in the next few weeks if that is not improving or any worsening symptoms sooner.  No med changes today.  Take care.   How to Take Your Blood Pressure Blood pressure is a measurement of how strongly your blood is pressing against the walls of your arteries. Arteries are blood vessels that carry blood from your heart throughout your body. Your health care provider takes your blood pressure at each office visit. You can also take your own blood pressure at home with a blood pressure monitor. You may need to take your own blood pressure to: Confirm a diagnosis of high blood pressure (hypertension). Monitor your blood pressure over time. Make sure your blood pressure medicine is working. Supplies needed: Blood pressure monitor. A chair to sit in. This should be a chair where you can sit upright with your back supported. Do not sit on a soft couch or an armchair. Table or desk. Small notebook and pencil or pen. How to prepare To get the most accurate reading, avoid the following for 30 minutes before you check your blood pressure: Drinking caffeine. Drinking alcohol. Eating. Smoking. Exercising. Five minutes before you check your blood pressure: Use the bathroom and urinate so that you have an empty bladder. Sit quietly in a chair. Do not talk. How to take your blood pressure To check your blood pressure, follow the instructions in the manual that came with your blood pressure monitor. If you have a digital blood pressure monitor, the instructions  may be as follows: Sit up straight in a chair. Place your feet on the floor. Do not cross your ankles or legs. Rest your left arm at the level of your heart on a table or desk or on the arm of a chair. Pull up your shirt sleeve. Wrap the blood pressure cuff around the upper part of your left arm, 1 inch (2.5 cm) above your elbow. It is best to wrap the cuff around bare skin. Fit the cuff snugly, but not too tightly, around your arm. You should be able to place only one finger between the cuff and your arm. Position the cord so that it rests in the bend of your elbow. Press the power button. Sit quietly while the cuff inflates and deflates. Read the digital reading on the monitor screen and write the numbers down (record them) in a notebook. Wait 2-3 minutes, then repeat the steps, starting at step 1. What does my blood pressure reading mean? A blood pressure reading consists of a higher number over a lower number. Ideally, your blood pressure should be below 120/80. The first ("top") number is called the systolic pressure. It is a measure of the pressure in your arteries as your heart beats. The second ("bottom") number is called the diastolic pressure. It is a measure of the pressure in your arteries as the heart relaxes. Blood pressure is classified into four stages. The following are the stages for adults who do not have a short-term serious illness or a chronic condition.  Systolic pressure and diastolic pressure are measured in a unit called mm Hg (millimeters of mercury).  Normal Systolic pressure: below 497. Diastolic pressure: below 80. Elevated Systolic pressure: 026-378. Diastolic pressure: below 80. Hypertension stage 1 Systolic pressure: 588-502. Diastolic pressure: 77-41. Hypertension stage 2 Systolic pressure: 287 or above. Diastolic pressure: 90 or above. You can have elevated blood pressure or hypertension even if only the systolic or only the diastolic number in your  reading is higher than normal. Follow these instructions at home: Medicines Take over-the-counter and prescription medicines only as told by your health care provider. Tell your health care provider if you are having any side effects from blood pressure medicine. General instructions Check your blood pressure as often as recommended by your health care provider. Check your blood pressure at the same time every day. Take your monitor to the next appointment with your health care provider to make sure that: You are using it correctly. It provides accurate readings. Understand what your goal blood pressure numbers are. Keep all follow-up visits. This is important. General tips Your health care provider can suggest a reliable monitor that will meet your needs. There are several types of home blood pressure monitors. Choose a monitor that has an arm cuff. Do not choose a monitor that measures your blood pressure from your wrist or finger. Choose a cuff that wraps snugly, not too tight or too loose, around your upper arm. You should be able to fit only one finger between your arm and the cuff. You can buy a blood pressure monitor at most drugstores or online. Where to find more information American Heart Association: www.heart.org Contact a health care provider if: Your blood pressure is consistently high. Your blood pressure is suddenly low. Get help right away if: Your systolic blood pressure is higher than 180. Your diastolic blood pressure is higher than 120. These symptoms may be an emergency. Get help right away. Call 911. Do not wait to see if the symptoms will go away. Do not drive yourself to the hospital. Summary Blood pressure is a measurement of how strongly your blood is pressing against the walls of your arteries. A blood pressure reading consists of a higher number over a lower number. Ideally, your blood pressure should be below 120/80. Check your blood pressure at the same  time every day. Avoid caffeine, alcohol, smoking, and exercise for 30 minutes prior to checking your blood pressure. These agents can affect the accuracy of the blood pressure reading. This information is not intended to replace advice given to you by your health care provider. Make sure you discuss any questions you have with your health care provider. Document Revised: 02/03/2021 Document Reviewed: 02/03/2021 Elsevier Patient Education  [place].

## 2022-07-10 NOTE — Assessment & Plan Note (Signed)
Tolerating current dose pravastatin, continue same.  Recheck labs next few months.

## 2022-07-10 NOTE — Assessment & Plan Note (Signed)
Stable control with change to verapamil, symptomatically feels better as well.  Continue same dosing, no change in other meds.

## 2022-07-11 NOTE — Addendum Note (Signed)
Encounter addended by: Juluis Mire, RN on: 07/11/2022 10:10 AM  Actions taken: Imaging Exam ended

## 2022-07-13 ENCOUNTER — Encounter (HOSPITAL_COMMUNITY): Payer: Self-pay | Admitting: *Deleted

## 2022-07-17 ENCOUNTER — Ambulatory Visit: Payer: 59 | Admitting: Family Medicine

## 2022-07-25 ENCOUNTER — Ambulatory Visit (HOSPITAL_COMMUNITY)
Admission: EM | Admit: 2022-07-25 | Discharge: 2022-07-25 | Disposition: A | Discharge status: Home / Self Care | Payer: No Typology Code available for payment source | Attending: Family Medicine | Admitting: Family Medicine

## 2022-07-25 ENCOUNTER — Encounter (HOSPITAL_COMMUNITY): Payer: Self-pay | Admitting: Emergency Medicine

## 2022-07-25 DIAGNOSIS — J020 Streptococcal pharyngitis: Secondary | ICD-10-CM | POA: Diagnosis not present

## 2022-07-25 LAB — POCT RAPID STREP A, ED / UC: Streptococcus, Group A Screen (Direct): POSITIVE — AB

## 2022-07-25 MED ORDER — AMOXICILLIN 875 MG PO TABS: 875.0000 mg | ORAL_TABLET | Freq: Two times a day (BID) | ORAL | 0 refills | Status: AC

## 2022-07-25 NOTE — Discharge Instructions (Signed)
You may use over the counter ibuprofen or acetaminophen as needed.  °For a sore throat, over the counter products such as [employer] Peroxyl Mouth Sore Rinse or Chloraseptic Sore Throat Spray may provide some temporary relief. ° ° ° ° °

## 2022-07-25 NOTE — ED Triage Notes (Signed)
Pt having sore throat, cough, congestion, headache, body aches and fever since yesterday. Took tyelnol, last dose 1p

## 2022-07-26 NOTE — ED Provider Notes (Signed)
[facility]   [id number] 07/25/22 Arrival Time: [id number]  ASSESSMENT & PLAN:  1. Strep throat    No signs of peritonsillar abscess. Discussed. Begin: Meds ordered this encounter  Medications   amoxicillin (AMOXIL) 875 MG tablet    Sig: Take 1 tablet (875 mg total) by mouth 2 (two) times daily for 10 days.    Dispense:  20 tablet    Refill:  0    Results for orders placed or performed during the hospital encounter of 07/25/22  POCT Rapid Strep A  Result Value Ref Range   Streptococcus, Group A Screen (Direct) POSITIVE (A) NEGATIVE   Labs Reviewed  POCT RAPID STREP A, ED / UC - Abnormal; Notable for the following components:      Result Value   Streptococcus, Group A Screen (Direct) POSITIVE (*)    All other components within normal limits    OTC analgesics and throat care as needed  Instructed to finish full 10 day course of antibiotics. Will follow up if not showing significant improvement over the next 24-48 hours.    Discharge Instructions      You may use over the counter ibuprofen or acetaminophen as needed.  For a sore throat, over the counter products such as Colgate Peroxyl Mouth Sore Rinse or Chloraseptic Sore Throat Spray may provide some temporary relief.       Reviewed expectations re: course of current medical issues. Questions answered. Outlined signs and symptoms indicating need for more acute intervention. Patient verbalized understanding. After Visit Summary given.   SUBJECTIVE:  Donald [name] is a 52 y.o. male who reports a sore throat.With subj fever. No respiratory symptoms. Normal PO intake but reports discomfort with swallowing. No specific alleviating factors. No neck pain or swelling. No associated nausea, vomiting, or abdominal pain. Known sick contacts: none. Recent travel: none. No tx PTA.   OBJECTIVE:  Vitals:   07/25/22 1759  BP: 126/80  Pulse: 95  Resp: 17  Temp: (!) 101.7 F (38.7 C)  TempSrc: Oral  SpO2:  95%     General appearance: alert; no distress HEENT: throat with marked erythema and with exudative tonsillar hypertrophy; uvula is midline Neck: supple with FROM; small bilateral cervical LAD3 Lungs: speaks full sentences without difficulty; unlabored Abd: soft; non-tender Skin: reveals no rash; warm and dry Psychological: alert and cooperative; normal mood and affect  Allergies  Allergen Reactions   Ace Inhibitors Cough    Lisinopril caused cough   Pork-Derived Products     Past Medical History:  Diagnosis Date   Blood transfusion without reported diagnosis    DEPRESSION, MILD    Dysuria    G E R D    Hypercholesterolemia    Hyperlipidemia    NOT TAKING MEDICATION   Hypertension    Lumbago    NEPHROLITHIASIS, HX OF    PARASOMNIA    Right bundle branch block    Social History   Socioeconomic History   Marital status: Married    Spouse name: Not on file   Number of children: 6   Years of education: 15   Highest education level: Not on file  Occupational History   Occupation: Glass blower/designer    Employer: [employer]  Tobacco Use   Smoking status: Former    Types: Cigarettes   Smokeless tobacco: Never  Substance and Sexual Activity   Alcohol use: No    Alcohol/week: 0.0 standard drinks of alcohol   Drug use: No   Sexual  activity: Not on file  Other Topics Concern   Not on file  Social History Narrative   ** Merged History Encounter **       Social Determinants of Health   Financial Resource Strain: Not on file  Food Insecurity: No Food Insecurity (06/04/2022)   Hunger Vital Sign    Worried About Running Out of Food in the Last Year: Never true    Ran Out of Food in the Last Year: Never true  Transportation Needs: No Transportation Needs (06/04/2022)   PRAPARE - Hydrologist (Medical): No    Lack of Transportation (Non-Medical): No  Physical Activity: Not on file  Stress: Not on file  Social Connections: Not on  file  Intimate Partner Violence: Not At Risk (06/04/2022)   Humiliation, Afraid, Rape, and Kick questionnaire    Fear of Current or Ex-Partner: No    Emotionally Abused: No    Physically Abused: No    Sexually Abused: No   Family History  Problem Relation Age of Onset   Hypertension Mother    Hypertension Sister            Vanessa Kick, MD 07/26/22 [phone]

## 2022-08-14 ENCOUNTER — Ambulatory Visit: Payer: No Typology Code available for payment source | Attending: Cardiovascular Disease | Admitting: Pharmacist

## 2022-08-14 VITALS — BP 126/80 | HR 69

## 2022-08-14 DIAGNOSIS — I10 Essential (primary) hypertension: Secondary | ICD-10-CM | POA: Diagnosis not present

## 2022-08-14 NOTE — Progress Notes (Signed)
Patient ID: Donald [name]                 DOB: [birth year]                      MRN: [id number]     HPI: Donald [name] is a 52 y.o. male referred by Dr. Johnsie Cancel to HTN clinic. 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 Echo 07/03/22 with normal EF 60-65%. BP elevated at 144/86 at last visit on 07/03/22. Pt wasn't tolerating diltiazem secondary to fatigue and feeling foggy, was started on verapamil '120mg'$  daily. 2 week monitor showed no afib, 10 SVT episodes with  longest 14 seconds, fastest 171 bpm.  Pt presents today for follow up. Reports feeling much better since changing diltizem to verapamil. Taking his losartan, reports he hasn't been on HCTZ since December ED visit. Med has been removed from his list. Upon reviewing discharge summary, pt was to stop amlodipine at d/c since he was started on diltiazem at that time, but don't see mention of HCTZ d/c. BP controlled today though and has been off of thiazide for 3 months. BP at home has ranged 117/78 - 141/86. No headache or LE edema. Occasional episode of dizziness 1-2x a week when walking that lasts for a few seconds, usually after he feels his heart racing. Discussed some episodes of SVT noted on his recent monitor.  Current HTN meds: losartan '100mg'$  daily, verapamil '120mg'$  daily Previously tried: diltiazem - foggy/tired BP goal: <130/66mHg  Family History: HTN in mother and sister.  Social History: Former tobacco use, no alcohol or drug use.  Diet: no salt, 1 Starbucks coffee or 3 cups homemade coffee a day.  Exercise: runs for 10 mins on treadmill 3 days a week  Home BP readings:   Labs:  06/12/22 - SCr 0.94, Na 140, K 4.2  Wt Readings from Last 3 Encounters:  07/10/22 237 lb 12.8 oz (107.9 kg)  07/03/22 235 lb (106.6 kg)  06/19/22 239 lb 9.6 oz (108.7 kg)   BP Readings from Last 3 Encounters:  07/25/22 126/80  07/10/22 130/72  07/03/22 (!) 144/86   Pulse  Readings from Last 3 Encounters:  07/25/22 95  07/10/22 65  07/03/22 80    Renal function: CrCl cannot be calculated (Patient's most recent lab result is older than the maximum 21 days allowed.).  Past Medical History:  Diagnosis Date   Blood transfusion without reported diagnosis    DEPRESSION, MILD    Dysuria    G E R D    Hypercholesterolemia    Hyperlipidemia    NOT TAKING MEDICATION   Hypertension    Lumbago    NEPHROLITHIASIS, HX OF    PARASOMNIA    Right bundle branch block     Current Outpatient Medications on File Prior to Visit  Medication Sig Dispense Refill   apixaban (ELIQUIS) 5 MG TABS tablet Take 1 tablet (5 mg total) by mouth 2 (two) times daily. 60 tablet 5   Cholecalciferol (VITAMIN D3) 50 MCG (2000 UT) TABS Take one tablet by mouth daily. 90 tablet 1   hydrochlorothiazide (HYDRODIURIL) 25 MG tablet Take 25 mg by mouth daily.     losartan (COZAAR) 100 MG tablet Take 1 tablet (100 mg total) by mouth daily. 90 tablet 1   pravastatin (PRAVACHOL) 20 MG tablet Take 1 tablet (20 mg total) by mouth daily. 90 tablet 1   verapamil (  CALAN-SR) 120 MG CR tablet Take 1 tablet (120 mg total) by mouth at bedtime. 90 tablet 3   No current facility-administered medications on file prior to visit.    Allergies  Allergen Reactions   Ace Inhibitors Cough    Lisinopril caused cough   Pork-Derived Products      Assessment/Plan:  1. Hypertension - BP at goal <130/37mHg today on losartan '100mg'$  daily and verapamil '120mg'$  daily (for SVT/afib). Tolerating verapamil better than diltiazem. Discovered today that he has been off his HCTZ since December (may have been some confusion at hospital d/c). Since BP is well controlled, will not resume. Pt to continue monitoring BP at home and call if consistently above goal, can resume HCTZ if needed. F/u with PharmD as needed.  Vonya Ohalloran E. Yannely Kintzel, PharmD, BCACP, [place] [address] [place] [address]Phone:  [phone] Fax: ([phone]/04/2023 1:47 PM

## 2022-08-14 NOTE — Patient Instructions (Addendum)
Your blood pressure is at goal < 130/85mHg  Continue taking your current medications  Aim for < 2,'000mg'$  of sodium each day and continue staying active  You are due for 6 month follow up with Dr NJohnsie Cancelin June/July - you can schedule this appt today

## 2022-10-01 ENCOUNTER — Other Ambulatory Visit: Payer: Self-pay | Admitting: Cardiovascular Disease

## 2022-10-01 DIAGNOSIS — I4891 Unspecified atrial fibrillation: Secondary | ICD-10-CM

## 2022-10-02 NOTE — Telephone Encounter (Signed)
Eliquis 5mg  refill request received. Patient is 52 years old, weight-107.9kg, Crea-0.94 on 06/12/22, Diagnosis-Afib, and last seen by Dr. Eden Emms on 07/03/22. Dose is appropriate based on dosing criteria. Will send in refill to requested pharmacy.

## 2022-10-09 ENCOUNTER — Encounter: Payer: Self-pay | Admitting: Family Medicine

## 2022-10-09 ENCOUNTER — Ambulatory Visit (INDEPENDENT_AMBULATORY_CARE_PROVIDER_SITE_OTHER): Payer: No Typology Code available for payment source | Admitting: Family Medicine

## 2022-10-09 VITALS — BP 114/72 | HR 75 | Temp 98.7°F | Ht 74.0 in | Wt 243.8 lb

## 2022-10-09 DIAGNOSIS — R0981 Nasal congestion: Secondary | ICD-10-CM

## 2022-10-09 DIAGNOSIS — I10 Essential (primary) hypertension: Secondary | ICD-10-CM

## 2022-10-09 DIAGNOSIS — E785 Hyperlipidemia, unspecified: Secondary | ICD-10-CM | POA: Diagnosis not present

## 2022-10-09 DIAGNOSIS — R7303 Prediabetes: Secondary | ICD-10-CM

## 2022-10-09 DIAGNOSIS — I480 Paroxysmal atrial fibrillation: Secondary | ICD-10-CM

## 2022-10-09 LAB — LIPID PANEL
Cholesterol: 197 mg/dL (ref 0–200)
HDL: 48.2 mg/dL (ref >39.00)
LDL Cholesterol: 137 mg/dL — ABNORMAL HIGH (ref 0–99)
NonHDL: 148.85
Total CHOL/HDL Ratio: 4
Triglycerides: 60 mg/dL (ref 0.0–149.0)
VLDL: 12 mg/dL (ref 0.0–40.0)

## 2022-10-09 LAB — COMPREHENSIVE METABOLIC PANEL
ALT: 34 U/L (ref 0–53)
AST: 31 U/L (ref 0–37)
Albumin: 4.1 g/dL (ref 3.5–5.2)
Alkaline Phosphatase: 52 U/L (ref 39–117)
BUN: 17 mg/dL (ref 6–23)
CO2: 26 mEq/L (ref 19–32)
Calcium: 9.3 mg/dL (ref 8.4–10.5)
Chloride: 103 mEq/L (ref 96–112)
Creatinine, Ser: 0.9 mg/dL (ref 0.40–1.50)
GFR: 96.98 mL/min (ref >60.00)
Glucose, Bld: 92 mg/dL (ref 70–99)
Potassium: 3.6 mEq/L (ref 3.5–5.1)
Sodium: 139 mEq/L (ref 135–145)
Total Bilirubin: 0.5 mg/dL (ref 0.2–1.2)
Total Protein: 7.7 g/dL (ref 6.0–8.3)

## 2022-10-09 LAB — HEMOGLOBIN A1C: Hgb A1c MFr Bld: 6 % (ref 4.6–6.5)

## 2022-10-09 MED ORDER — IPRATROPIUM BROMIDE 0.06 % NA SOLN: 1.0000 | Freq: Four times a day (QID) | NASAL | 5 refills | Status: DC

## 2022-10-09 MED ORDER — DOXYCYCLINE HYCLATE 100 MG PO TABS: 100.0000 mg | ORAL_TABLET | Freq: Two times a day (BID) | ORAL | 0 refills | Status: DC

## 2022-10-09 MED ORDER — PRAVASTATIN SODIUM 20 MG PO TABS: 20.0000 mg | ORAL_TABLET | Freq: Every day | ORAL | 1 refills | Status: DC

## 2022-10-09 MED ORDER — LOSARTAN POTASSIUM 100 MG PO TABS: 100.0000 mg | ORAL_TABLET | Freq: Every day | ORAL | 1 refills | Status: DC

## 2022-10-09 NOTE — Progress Notes (Signed)
Subjective:  Patient ID: Donald [name], male    DOB: 10/30/1970  Age: 52 y.o. MRN: [id number]  CC:  Chief Complaint  Patient presents with   Hyperlipidemia   Hypertension   Nasal Congestion    Pt states his nose is constantly stopped up x2 weeks    HPI Donald [name] presents for   Hypertension: With history of A-fib with RVR, paroxysmal atrial fibrillation..  Treated with losartan 100 mg daily, verapamil 120 mg CR daily (intolerant to Cardizem),  anticoagulation with Eliquis 5 mg twice daily.  No new bleeding, did have kidney stone evaluated by urology.   Denies recent palpitations, chest pain or dyspnea. No new side effects.  Home readings: 125/83-85.  BP Readings from Last 3 Encounters:  10/09/22 114/72  08/14/22 126/80  07/25/22 126/80   Lab Results  Component Value Date   CREATININE 0.94 06/12/2022   Hyperlipidemia: Treated with pravastatin 20 mg daily.  Denies new myalgias or side effects. Lab Results  Component Value Date   CHOL 190 05/01/2022   HDL 49.00 05/01/2022   LDLCALC 126 (H) 05/01/2022   TRIG 76.0 05/01/2022   CHOLHDL 4 05/01/2022   Lab Results  Component Value Date   ALT 12 06/12/2022   AST 14 06/12/2022   ALKPHOS 47 06/12/2022   BILITOT 0.5 06/12/2022   Elevated total protein Noted on prior labs.  Borderline at 8.4 on 06/12/2022.   Prediabetes: Diet/exercise approach.  Weight has increased somewhat since February visit. Diet: some fast food, some sweet tea at times.  Exercise: walking daily.   Lab Results  Component Value Date   HGBA1C 6.1 04/17/2022   Wt Readings from Last 3 Encounters:  10/09/22 243 lb 12.8 oz (110.6 kg)  07/10/22 237 lb 12.8 oz (107.9 kg)  07/03/22 235 lb (106.6 kg)   Nasal congestion: Past 2 weeks. Discolored nasal discharge. Feels blocked - not tooth/face pain or HA.  No prior allergies. Nose feels blocked.  Tx: tylenol, antihistamine at night. Min relief.    History Patient Active Problem List    Diagnosis Date Noted   Paroxysmal atrial fibrillation (HCC) 07/10/2022   Atrial fibrillation with RVR (HCC) 06/03/2022   Atypical chest pain 04/02/2021   HLD (hyperlipidemia) 08/25/2020   Encounter for general adult medical examination with abnormal findings 08/19/2020   Prediabetes 08/19/2020   Screen for colon cancer 08/19/2020   Diuretic-induced hypokalemia 08/19/2020   Chronic bilateral low back pain without sciatica 08/19/2020   Right bundle branch block 03/11/2010   Essential hypertension 05/14/2009   PARASOMNIA 05/14/2009   G E R D 05/13/2009   Past Medical History:  Diagnosis Date   Blood transfusion without reported diagnosis    DEPRESSION, MILD    Dysuria    G E R D    Hypercholesterolemia    Hyperlipidemia    NOT TAKING MEDICATION   Hypertension    Lumbago    NEPHROLITHIASIS, HX OF    PARASOMNIA    Right bundle branch block    Past Surgical History:  Procedure Laterality Date   CARDIAC CATHETERIZATION N/A 01/19/2016   Procedure: Left Heart Cath and Coronary Angiography;  Surgeon: Tonny Bollman, MD;  Location: [facility] INVASIVE CV LAB;  Service: Cardiovascular;  Laterality: N/A;   HAND SURGERY     right and left hand     Allergies  Allergen Reactions   Ace Inhibitors Cough    Lisinopril caused cough   Pork-Derived Products    Prior to Admission medications  Medication Sig Start Date End Date Taking? Authorizing Provider  Cholecalciferol (VITAMIN D3) 50 MCG (2000 UT) TABS Take one tablet by mouth daily. 04/25/21  Yes Shade Flood, MD  ELIQUIS 5 MG TABS tablet TAKE 1 TABLET BY MOUTH TWICE A DAY 10/02/22  Yes Wendall Stade, MD  losartan (COZAAR) 100 MG tablet Take 1 tablet (100 mg total) by mouth daily. 07/10/22  Yes Shade Flood, MD  pravastatin (PRAVACHOL) 20 MG tablet Take 1 tablet (20 mg total) by mouth daily. 07/10/22  Yes Shade Flood, MD  verapamil (CALAN-SR) 120 MG CR tablet Take 1 tablet (120 mg total) by mouth at bedtime. 07/03/22  Yes Wendall Stade, MD   Social History   Socioeconomic History   Marital status: Married    Spouse name: Not on file   Number of children: 6   Years of education: 15   Highest education level: Not on file  Occupational History   Occupation: Location manager    Employer: [employer]  Tobacco Use   Smoking status: Former    Types: Cigarettes   Smokeless tobacco: Never  Substance and Sexual Activity   Alcohol use: No    Alcohol/week: 0.0 standard drinks of alcohol   Drug use: No   Sexual activity: Not on file  Other Topics Concern   Not on file  Social History Narrative   ** Merged History Encounter **       Social Determinants of Health   Financial Resource Strain: Not on file  Food Insecurity: No Food Insecurity (06/04/2022)   Hunger Vital Sign    Worried About Running Out of Food in the Last Year: Never true    Ran Out of Food in the Last Year: Never true  Transportation Needs: No Transportation Needs (06/04/2022)   PRAPARE - Administrator, Civil Service (Medical): No    Lack of Transportation (Non-Medical): No  Physical Activity: Not on file  Stress: Not on file  Social Connections: Not on file  Intimate Partner Violence: Not At Risk (06/04/2022)   Humiliation, Afraid, Rape, and Kick questionnaire    Fear of Current or Ex-Partner: No    Emotionally Abused: No    Physically Abused: No    Sexually Abused: No    Review of Systems   Objective:   Vitals:   10/09/22 0822  BP: 114/72  Pulse: 75  Temp: 98.7 F (37.1 C)  TempSrc: Temporal  SpO2: 100%  Weight: 243 lb 12.8 oz (110.6 kg)  Height: 6\' 2"  (1.88 m)     Physical Exam Vitals reviewed.  Constitutional:      Appearance: He is well-developed.  HENT:     Head: Normocephalic and atraumatic.     Nose:     Comments: Edematous turbinates, small amount of white-clear nasal discharge on the right, sinuses nontender.  Moist oral mucosa mouth.  No lymphadenopathy of neck. Neck:     Vascular: No  carotid bruit or JVD.  Cardiovascular:     Rate and Rhythm: Normal rate and regular rhythm.     Heart sounds: Normal heart sounds. No murmur heard. Pulmonary:     Effort: Pulmonary effort is normal.     Breath sounds: Normal breath sounds. No rales.  Musculoskeletal:     Right lower leg: No edema.     Left lower leg: No edema.  Skin:    General: Skin is warm and dry.  Neurological:     Mental Status:  He is alert and oriented to person, place, and time.  Psychiatric:        Mood and Affect: Mood normal.        Assessment & Plan:  Donald [name] is a 52 y.o. male . Hyperlipidemia, unspecified hyperlipidemia type - Plan: Lipid panel  -  Stable, tolerating current regimen. Medications refilled. Labs pending as above.   Essential hypertension - Plan: Comprehensive metabolic panel   Stable, tolerating current regimen. Medications refilled. Labs pending as above.   Prediabetes - Plan: Hemoglobin A1c  -Avoidance of sugar containing beverages, fast food, stay active.  Check updated labs.  Sinus congestion - Plan: ipratropium (ATROVENT) 0.06 % nasal spray, doxycycline (VIBRA-TABS) 100 MG tablet  -Doubt infection, more congestion at this time.  Minimal relief with antihistamine.  Trial of Flonase over-the-counter, Atrovent nasal spray if needed, doxycycline prescribed if discolored nasal discharge not improving in the next week to 10 days or any worsening in the interim.  RTC precautions.  Paroxysmal atrial fibrillation (HCC) Asymptomatic, tolerating anticoagulation, rate controlled.  Meds ordered this encounter  Medications   ipratropium (ATROVENT) 0.06 % nasal spray    Sig: Place 1-2 sprays into both nostrils 4 (four) times daily. As needed for nasal congestion    Dispense:  15 mL    Refill:  5   doxycycline (VIBRA-TABS) 100 MG tablet    Sig: Take 1 tablet (100 mg total) by mouth 2 (two) times daily.    Dispense:  20 tablet    Refill:  0   Patient Instructions  I will  check labs, but for prediabetes cut back on fast food and sugar containing beverages like sweet tea.  Currently Atrovent nasal spray for congestion if Flonase over-the-counter does not work.  If discolored nasal discharge is not improving in the next week to 10 days or if any sinus, tooth pain, or headache, start antibiotics sooner.  Take care!      Signed,   Meredith Staggers, MD [facility], [facility] 10/09/22 9:00 AM

## 2022-10-09 NOTE — Patient Instructions (Addendum)
I will check labs, but for prediabetes cut back on fast food and sugar containing beverages like sweet tea.  Currently Atrovent nasal spray for congestion if Flonase over-the-counter does not work.  If discolored nasal discharge is not improving in the next week to 10 days or if any sinus, tooth pain, or headache, start antibiotics sooner.  Take care!

## 2022-10-17 ENCOUNTER — Other Ambulatory Visit: Payer: Self-pay | Admitting: Family Medicine

## 2022-10-17 DIAGNOSIS — E785 Hyperlipidemia, unspecified: Secondary | ICD-10-CM

## 2022-10-17 MED ORDER — PRAVASTATIN SODIUM 40 MG PO TABS: 40.0000 mg | ORAL_TABLET | Freq: Every day | ORAL | 1 refills | Status: DC

## 2022-10-17 NOTE — Progress Notes (Signed)
See lab notes

## 2022-10-24 ENCOUNTER — Other Ambulatory Visit: Payer: Self-pay

## 2022-11-06 ENCOUNTER — Observation Stay (HOSPITAL_COMMUNITY)
Admission: EM | Admit: 2022-11-06 | Discharge: 2022-11-06 | Disposition: A | Discharge status: Home / Self Care | Payer: No Typology Code available for payment source | Attending: Emergency Medicine | Admitting: Emergency Medicine

## 2022-11-06 ENCOUNTER — Other Ambulatory Visit: Payer: Self-pay

## 2022-11-06 ENCOUNTER — Emergency Department (HOSPITAL_COMMUNITY): Payer: No Typology Code available for payment source

## 2022-11-06 ENCOUNTER — Encounter (HOSPITAL_COMMUNITY): Payer: Self-pay

## 2022-11-06 DIAGNOSIS — I2510 Atherosclerotic heart disease of native coronary artery without angina pectoris: Secondary | ICD-10-CM | POA: Diagnosis not present

## 2022-11-06 DIAGNOSIS — E785 Hyperlipidemia, unspecified: Secondary | ICD-10-CM | POA: Diagnosis present

## 2022-11-06 DIAGNOSIS — R079 Chest pain, unspecified: Principal | ICD-10-CM | POA: Diagnosis present

## 2022-11-06 DIAGNOSIS — I10 Essential (primary) hypertension: Secondary | ICD-10-CM | POA: Diagnosis not present

## 2022-11-06 DIAGNOSIS — E876 Hypokalemia: Secondary | ICD-10-CM | POA: Diagnosis not present

## 2022-11-06 DIAGNOSIS — Z7901 Long term (current) use of anticoagulants: Secondary | ICD-10-CM | POA: Diagnosis not present

## 2022-11-06 DIAGNOSIS — R0981 Nasal congestion: Secondary | ICD-10-CM

## 2022-11-06 DIAGNOSIS — Z87891 Personal history of nicotine dependence: Secondary | ICD-10-CM | POA: Diagnosis not present

## 2022-11-06 DIAGNOSIS — K219 Gastro-esophageal reflux disease without esophagitis: Secondary | ICD-10-CM | POA: Diagnosis present

## 2022-11-06 DIAGNOSIS — R7303 Prediabetes: Secondary | ICD-10-CM | POA: Diagnosis present

## 2022-11-06 DIAGNOSIS — Z79899 Other long term (current) drug therapy: Secondary | ICD-10-CM | POA: Insufficient documentation

## 2022-11-06 DIAGNOSIS — D649 Anemia, unspecified: Secondary | ICD-10-CM | POA: Diagnosis present

## 2022-11-06 DIAGNOSIS — I480 Paroxysmal atrial fibrillation: Secondary | ICD-10-CM | POA: Diagnosis present

## 2022-11-06 LAB — BASIC METABOLIC PANEL
Anion gap: 9 (ref 5–15)
BUN: 21 mg/dL — ABNORMAL HIGH (ref 6–20)
CO2: 22 mmol/L (ref 22–32)
Calcium: 8.8 mg/dL — ABNORMAL LOW (ref 8.9–10.3)
Chloride: 107 mmol/L (ref 98–111)
Creatinine, Ser: 0.9 mg/dL (ref 0.61–1.24)
GFR, Estimated: >60 mL/min (ref >60)
Glucose, Bld: 184 mg/dL — ABNORMAL HIGH (ref 70–99)
Potassium: 3.2 mmol/L — ABNORMAL LOW (ref 3.5–5.1)
Sodium: 138 mmol/L (ref 135–145)

## 2022-11-06 LAB — CBC
HCT: 39.5 % (ref 39.0–52.0)
Hemoglobin: 12.7 g/dL — ABNORMAL LOW (ref 13.0–17.0)
MCH: 27.6 pg (ref 26.0–34.0)
MCHC: 32.2 g/dL (ref 30.0–36.0)
MCV: 85.9 fL (ref 80.0–100.0)
Platelets: 276 10*3/uL (ref 150–400)
RBC: 4.6 MIL/uL (ref 4.22–5.81)
RDW: 14.8 % (ref 11.5–15.5)
WBC: 5.9 10*3/uL (ref 4.0–10.5)
nRBC: 0 % (ref 0.0–0.2)

## 2022-11-06 LAB — GLUCOSE, CAPILLARY
Glucose-Capillary: 111 mg/dL — ABNORMAL HIGH (ref 70–99)
Glucose-Capillary: 97 mg/dL (ref 70–99)

## 2022-11-06 LAB — TROPONIN I (HIGH SENSITIVITY)
Troponin I (High Sensitivity): 2 ng/L (ref <18)
Troponin I (High Sensitivity): 6 ng/L (ref <18)

## 2022-11-06 MED ORDER — IPRATROPIUM BROMIDE 0.06 % NA SOLN: 1.0000 | Freq: Three times a day (TID) | NASAL | Status: DC

## 2022-11-06 MED ORDER — VERAPAMIL HCL ER 120 MG PO TBCR: 120.0000 mg | EXTENDED_RELEASE_TABLET | Freq: Every day | ORAL | Status: DC

## 2022-11-06 MED ORDER — INSULIN ASPART 100 UNIT/ML IJ SOLN: 0.0000 [IU] | Freq: Three times a day (TID) | INTRAMUSCULAR | Status: DC

## 2022-11-06 MED ORDER — IPRATROPIUM BROMIDE 0.06 % NA SOLN: 1.0000 | Freq: Three times a day (TID) | NASAL | Status: DC | PRN

## 2022-11-06 MED ORDER — INSULIN ASPART 100 UNIT/ML IJ SOLN: 0.0000 [IU] | Freq: Every day | INTRAMUSCULAR | Status: DC

## 2022-11-06 MED ORDER — PANTOPRAZOLE SODIUM 40 MG PO TBEC: 40.0000 mg | DELAYED_RELEASE_TABLET | Freq: Every day | ORAL | 0 refills | Status: DC

## 2022-11-06 MED ORDER — POTASSIUM CHLORIDE CRYS ER 20 MEQ PO TBCR
40.0000 meq | EXTENDED_RELEASE_TABLET | Freq: Once | ORAL | Status: AC
  Administered 2022-11-06: 40 meq via ORAL
  Filled 2022-11-06: qty 2

## 2022-11-06 MED ORDER — PANTOPRAZOLE SODIUM 40 MG PO TBEC
40.0000 mg | DELAYED_RELEASE_TABLET | Freq: Every day | ORAL | Status: DC
  Administered 2022-11-06: 40 mg via ORAL
  Filled 2022-11-06: qty 1

## 2022-11-06 MED ORDER — ASPIRIN 81 MG PO CHEW
324.0000 mg | CHEWABLE_TABLET | Freq: Once | ORAL | Status: AC
  Administered 2022-11-06: 324 mg via ORAL
  Filled 2022-11-06: qty 4

## 2022-11-06 MED ORDER — LOSARTAN POTASSIUM 50 MG PO TABS: 100.0000 mg | ORAL_TABLET | Freq: Every day | ORAL | Status: DC

## 2022-11-06 MED ORDER — APIXABAN 5 MG PO TABS
5.0000 mg | ORAL_TABLET | Freq: Two times a day (BID) | ORAL | Status: DC
  Administered 2022-11-06: 5 mg via ORAL
  Filled 2022-11-06: qty 1

## 2022-11-06 MED ORDER — LOSARTAN POTASSIUM 50 MG PO TABS
100.0000 mg | ORAL_TABLET | Freq: Every day | ORAL | Status: DC
  Administered 2022-11-06: 100 mg via ORAL
  Filled 2022-11-06: qty 2

## 2022-11-06 MED ORDER — ALUM & MAG HYDROXIDE-SIMETH 200-200-20 MG/5ML PO SUSP: 30.0000 mL | ORAL | Status: DC | PRN

## 2022-11-06 MED ORDER — NITROGLYCERIN 0.4 MG SL SUBL
0.4000 mg | SUBLINGUAL_TABLET | SUBLINGUAL | Status: DC | PRN
  Administered 2022-11-06 (×2): 0.4 mg via SUBLINGUAL
  Filled 2022-11-06: qty 1

## 2022-11-06 MED ORDER — LIDOCAINE VISCOUS HCL 2 % MT SOLN: 15.0000 mL | OROMUCOSAL | Status: DC | PRN

## 2022-11-06 MED ORDER — ONDANSETRON HCL 4 MG/2ML IJ SOLN: 4.0000 mg | Freq: Four times a day (QID) | INTRAMUSCULAR | Status: DC | PRN

## 2022-11-06 MED ORDER — ACETAMINOPHEN 325 MG PO TABS: 650.0000 mg | ORAL_TABLET | ORAL | Status: DC | PRN

## 2022-11-06 MED ORDER — PRAVASTATIN SODIUM 20 MG PO TABS
20.0000 mg | ORAL_TABLET | Freq: Every day | ORAL | Status: DC
  Administered 2022-11-06: 20 mg via ORAL
  Filled 2022-11-06: qty 1

## 2022-11-06 NOTE — H&P (Signed)
History and Physical    Patient: Donald [name] [id number] DOB: 03-Apr-1971 DOA: 11/06/2022 DOS: the patient was seen and examined on 11/06/2022 PCP: Shade Flood, MD  Patient coming from: Home  Chief Complaint:  Chief Complaint  Patient presents with   Chest Pain   HPI: Donald [name] is a 52 y.o. male with medical history significant of mild depression, dysuria, GERD, hyperlipidemia, hypertension, chronic bilateral lower back pain without sciatica, nephrolithiasis, parasomnia, prediabetes, right bundle branch block, paroxysmal atrial fibrillation on apixaban who presented to the emergency department with 2-hour long sudden onset chest pain while at rest, radiating to his left shoulder associated with palpitations and dyspnea.  No diaphoresis, PND, orthopnea or recent pitting edema of the lower extremities.  He denied fever, chills, rhinorrhea, sore throat, wheezing or hemoptysis. No abdominal pain, nausea, emesis, diarrhea, constipation, melena or hematochezia.  No flank pain, dysuria, frequency or hematuria.  No polyuria, polydipsia, polyphagia or blurred vision.   ED course: Initial vital signs were temperature 98.2 F, pulse 74, respirations 21, BP 205/91 mmHg O2 sat 100% on room air.  The patient received aspirin 324 mg p.o. x 1 and KCl 40 mEq p.o. x 1.  Lab work: Troponin x 2 normal.  CBC showed white count 5.9, hemoglobin 12.7 g/dL platelets 956.  BMP showed a potassium of 3.2 mmol/L, glucose 184, BUN 21, creatinine 0.9 and calcium 8.9 mg/dL.  The rest of the electrolytes were normal.  Imaging: Portable 1 view chest radiograph with low lung volumes, but no acute cardiopulmonary process.   Review of Systems: As mentioned in the history of present illness. All other systems reviewed and are negative. Past Medical History:  Diagnosis Date   Blood transfusion without reported diagnosis    DEPRESSION, MILD    Dysuria    G E R D    Hypercholesterolemia    Hyperlipidemia     NOT TAKING MEDICATION   Hypertension    Lumbago    NEPHROLITHIASIS, HX OF    PARASOMNIA    Right bundle branch block    Past Surgical History:  Procedure Laterality Date   CARDIAC CATHETERIZATION N/A 01/19/2016   Procedure: Left Heart Cath and Coronary Angiography;  Surgeon: Tonny Bollman, MD;  Location: [facility] INVASIVE CV LAB;  Service: Cardiovascular;  Laterality: N/A;   HAND SURGERY     right and left hand     Social History:  reports that he has quit smoking. His smoking use included cigarettes. He has never used smokeless tobacco. He reports that he does not drink alcohol and does not use drugs.  Allergies  Allergen Reactions   Ace Inhibitors Cough    Lisinopril caused cough   Pork-Derived Products     Family History  Problem Relation Age of Onset   Hypertension Mother    Hypertension Sister     Prior to Admission medications   Medication Sig Start Date End Date Taking? Authorizing Provider  ELIQUIS 5 MG TABS tablet TAKE 1 TABLET BY MOUTH TWICE A DAY 10/02/22  Yes Wendall Stade, MD  hydrochlorothiazide (HYDRODIURIL) 25 MG tablet Take 25 mg by mouth daily as needed (swelling). 10/09/22  Yes [provider]  ipratropium (ATROVENT) 0.06 % nasal spray Place 1-2 sprays into both nostrils 4 (four) times daily. As needed for nasal congestion Patient taking differently: Place 1-2 sprays into both nostrils 3 (three) times daily. 10/09/22  Yes Shade Flood, MD  losartan (COZAAR) 100 MG tablet Take 1 tablet (100 mg total) by  mouth daily. 10/09/22  Yes Shade Flood, MD  pravastatin (PRAVACHOL) 20 MG tablet Take 20 mg by mouth daily.   Yes [provider]  verapamil (CALAN-SR) 120 MG CR tablet Take 1 tablet (120 mg total) by mouth at bedtime. 07/03/22  Yes Wendall Stade, MD  Cholecalciferol (VITAMIN D3) 50 MCG (2000 UT) TABS Take one tablet by mouth daily. Patient not taking: Reported on 11/06/2022 04/25/21   Shade Flood, MD  doxycycline (VIBRA-TABS) 100 MG  tablet Take 1 tablet (100 mg total) by mouth 2 (two) times daily. Patient not taking: Reported on 11/06/2022 10/09/22   Shade Flood, MD  pravastatin (PRAVACHOL) 40 MG tablet Take 1 tablet (40 mg total) by mouth daily. Patient not taking: Reported on 11/06/2022 10/17/22   Shade Flood, MD    Physical Exam: Vitals:   11/06/22 0045 11/06/22 0300 11/06/22 0315 11/06/22 0503  BP: (!) 177/86 130/74 130/84 129/84  Pulse: 91 75 65 62  Resp: 20 20 17 18   Temp:    97.8 F (36.6 C)  TempSrc:    Oral  SpO2: 99% 96% 95% 100%  Weight:      Height:       Physical Exam Vitals and nursing note reviewed.  Constitutional:      General: He is awake. He is not in acute distress.    Appearance: He is well-developed. He is obese.  HENT:     Head: Normocephalic.     Nose: No rhinorrhea.     Mouth/Throat:     Mouth: Mucous membranes are moist.  Eyes:     General: No scleral icterus.    Pupils: Pupils are equal, round, and reactive to light.  Neck:     Vascular: No JVD.  Cardiovascular:     Rate and Rhythm: Normal rate and regular rhythm.     Heart sounds: S1 normal and S2 normal.  Pulmonary:     Breath sounds: Normal breath sounds. No decreased breath sounds, wheezing or rhonchi.  Abdominal:     General: Bowel sounds are normal. There is no distension.     Palpations: Abdomen is soft.     Tenderness: There is no abdominal tenderness. There is no guarding.  Musculoskeletal:     Cervical back: Neck supple.     Right lower leg: No edema.     Left lower leg: No edema.  Skin:    General: Skin is warm and dry.  Neurological:     General: No focal deficit present.     Mental Status: He is alert and oriented to person, place, and time.  Psychiatric:        Mood and Affect: Mood normal.        Behavior: Behavior normal. Behavior is cooperative.     Data Reviewed:  Results are pending, will review when available.  EKG: Vent. rate 101 BPM PR interval 171 ms QRS duration 174  ms QT/QTcB 410/532 ms P-R-T axes 73 74 36 Sinus tachycardia Right bundle branch block  07/03/2022 transthoracic echocardiogram: IMPRESSIONS:   1. Left ventricular ejection fraction, by estimation, is 60 to 65%. The  left ventricle has normal function. The left ventricle has no regional  wall motion abnormalities. Left ventricular diastolic parameters were  normal.   2. Right ventricular systolic function is normal. The right ventricular  size is normal.   3. Left atrial size was mildly dilated.   4. The mitral valve is abnormal. Trivial mitral valve regurgitation. No  evidence of mitral stenosis.   5. The aortic valve is tricuspid. Aortic valve regurgitation is not  visualized. No aortic stenosis is present.   6. Aortic dilatation noted. There is mild dilatation of the aortic root,  measuring 38 mm.   7. The inferior vena cava is normal in size with greater than 50%  respiratory variability, suggesting right atrial pressure of 3 mmHg.   Assessment and Plan: Principal Problem:   Chest pain Atypical in nature. Observation/telemetry. Supplemental oxygen as needed. Sublingual nitroglycerin as needed. Parenteral analgesics as needed. Antiemetics as needed Continue apixaban 5 mg p.o. twice daily. Cardiology consult appreciated. Atypical and seems to be GI in nature. They recommended PPI therapy. No need for IP cardiac workup.  Active Problems:   Essential hypertension Continue verapamil 120 mg p.o. at bedtime. Continue losartan 100 mg p.o. daily.    G E R D Continue pantoprazole 40 mg p.o. daily.    Hypokalemia Replacement ordered.    Prediabetes Carbohydrate modified diet. CBG monitoring following the heart below.    HLD (hyperlipidemia) Continue pravastatin 20 mg p.o. daily.    Paroxysmal atrial fibrillation (HCC) CHA2DS2-VASc Score of at least 3. -(Aortic atherosclerosis, HTN and pre-DM). Continue apixaban and verapamil.    Hypocalcemia Will follow-up with  primary care provider.    Normocytic anemia Monitor hematocrit and hemoglobin.     Advance Care Planning:   Code Status: Full Code   Consults: Cardiology Lennie Odor, MD)  Family Communication: His spouse was at bedside.  Severity of Illness: The appropriate patient status for this patient is OBSERVATION. Observation status is judged to be reasonable and necessary in order to provide the required intensity of service to ensure the patient's safety. The patient's presenting symptoms, physical exam findings, and initial radiographic and laboratory data in the context of their medical condition is felt to place them at decreased risk for further clinical deterioration. Furthermore, it is anticipated that the patient will be medically stable for discharge from the hospital within 2 midnights of admission.   Author: Bobette Mo, MD 11/06/2022 7:08 AM  For on call review [email or web address].   This document was prepared using Dragon voice recognition software and may contain some unintended transcription errors.

## 2022-11-06 NOTE — ED Provider Notes (Signed)
[facility] Emergency Department Provider Note MRN:  [id number]  Arrival date & time: 11/06/22     Chief Complaint   Chest Pain   History of Present Illness   Donald [name] is a 52 y.o. year-old male presents to the ED with chief complaint of chest pain.  Onset started about 2 hours ago.  Hx of afib on eliquis.  Reports associated SOB.  Denies fever, chills, or cough.  Denies any abdominal pain.  Nothing makes the symptoms better or worse.  History provided by patient.   Review of Systems  Pertinent positive and negative review of systems noted in HPI.    Physical Exam   Vitals:   11/06/22 0300 11/06/22 0315  BP: 130/74 130/84  Pulse: 75 65  Resp: 20 17  Temp:    SpO2: 96% 95%    CONSTITUTIONAL:  uncomfortable-appearing, NAD NEURO:  Alert and oriented x 3, CN 3-12 grossly intact EYES:  eyes equal and reactive ENT/NECK:  Supple, no stridor  CARDIO:  tachycardic, regular rhythm, appears well-perfused  PULM:  No respiratory distress, CTAB GI/GU:  non-distended,  MSK/SPINE:  No gross deformities, no edema, moves all extremities  SKIN:  no rash, atraumatic   *Additional and/or pertinent findings included in MDM below  Diagnostic and Interventional Summary    EKG Interpretation  Date/Time:  Monday November 06 2022 00:34:11 EDT Ventricular Rate:  101 PR Interval:  171 QRS Duration: 174 QT Interval:  410 QTC Calculation: 532 R Axis:   74 Text Interpretation: Sinus tachycardia Right bundle branch block Confirmed by Zadie Rhine ([id number]) on 11/06/2022 1:08:01 AM       Labs Reviewed  BASIC METABOLIC PANEL - Abnormal; Notable for the following components:      Result Value   Potassium 3.2 (*)    Glucose, Bld 184 (*)    BUN 21 (*)    Calcium 8.8 (*)    All other components within normal limits  CBC - Abnormal; Notable for the following components:   Hemoglobin 12.7 (*)    All other components within normal limits  TROPONIN I (HIGH  SENSITIVITY)  TROPONIN I (HIGH SENSITIVITY)    DG Chest Port 1 View  Final Result      Medications  nitroGLYCERIN (NITROSTAT) SL tablet 0.4 mg (0.4 mg Sublingual Given 11/06/22 0106)  aspirin chewable tablet 324 mg (324 mg Oral Given 11/06/22 0048)  potassium chloride SA (KLOR-CON M) CR tablet 40 mEq (40 mEq Oral Given 11/06/22 0225)     Procedures  /  Critical Care Procedures  ED Course and Medical Decision Making  I have reviewed the triage vital signs, the nursing notes, and pertinent available records from the EMR.  Social Determinants Affecting Complexity of Care: Patient has no clinically significant social determinants affecting this chief complaint..   ED Course:    Medical Decision Making Patient here with chest pain.  Onset about 4 hours ago now (1 PTA).  Hx of DM, HL, HTN, afib on eliquis.    EKG shows RBBB (old).  No acute ischemic changes.  Trop 2->6  HEART score is 5, would likely benefit from observation admission.  Still have some intermittent CP, but much improved from earlier.  Doubt PE, has been compliant with his eliquis.  No cough of fever to suggest infection.  Amount and/or Complexity of Data Reviewed Labs: ordered. Radiology: ordered.  Risk OTC drugs. Prescription drug management. Decision regarding hospitalization.     Consultants: I consulted with Hospitalist, Dr.  Margo Aye, who is appreciated for admitting.   Treatment and Plan: Patient's exam and diagnostic results are concerning for chest pain.  Feel that patient will need admission to the hospital for further treatment and evaluation.    Final Clinical Impressions(s) / ED Diagnoses     ICD-10-CM   1. Chest pain, unspecified type  R07.9       ED Discharge Orders     None         Discharge Instructions Discussed with and Provided to Patient:   Discharge Instructions   None      Roxy Horseman, PA-C 11/06/22 0341    Zadie Rhine, MD 11/06/22 [id number]

## 2022-11-06 NOTE — Discharge Summary (Signed)
Physician Discharge Summary   Patient: Donald [name] MRN: [id number] DOB: [birth year]  Admit date:     11/06/2022  Discharge date: 11/06/22  Discharge Physician: Bobette Mo   PCP: Shade Flood, MD   Recommendations at discharge:    Avoid spicy foods. Follow-up with PCP. Follow-up with cardiology as an outpatient.  Discharge Diagnoses: Active Problems:   Essential hypertension   G E R D   Hypokalemia   Prediabetes   HLD (hyperlipidemia)   Paroxysmal atrial fibrillation (HCC)   Chest pain   Hypocalcemia   Normocytic anemia  Resolved problems: Chest Pain Hypokalemia  Hospital Course: HPI: Donald [name] is a 51 y.o. male with medical history significant of mild depression, dysuria, GERD, hyperlipidemia, hypertension, chronic bilateral lower back pain without sciatica, nephrolithiasis, parasomnia, prediabetes, right bundle branch block, paroxysmal atrial fibrillation on apixaban who presented to the emergency department with 2-hour long sudden onset chest pain while at rest, radiating to his left shoulder associated with palpitations and dyspnea.  No diaphoresis, PND, orthopnea or recent pitting edema of the lower extremities.  He denied fever, chills, rhinorrhea, sore throat, wheezing or hemoptysis. No abdominal pain, nausea, emesis, diarrhea, constipation, melena or hematochezia.  No flank pain, dysuria, frequency or hematuria.  No polyuria, polydipsia, polyphagia or blurred vision.    ED course: Initial vital signs were temperature 98.2 F, pulse 74, respirations 21, BP 205/91 mmHg O2 sat 100% on room air.  The patient received aspirin 324 mg p.o. x 1 and KCl 40 mEq p.o. x 1.   Lab work: Troponin x 2 normal.  CBC showed white count 5.9, hemoglobin 12.7 g/dL platelets 409.  BMP showed a potassium of 3.2 mmol/L, glucose 184, BUN 21, creatinine 0.9 and calcium 8.9 mg/dL.  The rest of the electrolytes were normal.   Imaging: Portable 1 view chest radiograph with low  lung volumes, but no acute cardiopulmonary process.  Assessment and Plan: Assessment and Plan: Principal Problem:   Chest pain Atypical in nature. Observation/telemetry. Supplemental oxygen as needed. Sublingual nitroglycerin as needed. Parenteral analgesics as needed. Antiemetics as needed Continue apixaban 5 mg p.o. twice daily. Cardiology consult appreciated. Atypical and seems to be GI in nature. They recommended PPI therapy. No need for IP cardiac workup.   Active Problems:   Essential hypertension Continue verapamil 120 mg p.o. at bedtime. Continue losartan 100 mg p.o. daily.     G E R D Continue pantoprazole 40 mg p.o. daily.     Hypokalemia Replacement ordered.     Prediabetes Carbohydrate modified diet. CBG monitoring following the heart below.     HLD (hyperlipidemia) Continue pravastatin 20 mg p.o. daily.     Paroxysmal atrial fibrillation (HCC) CHA2DS2-VASc Score of at least 3. -(Aortic atherosclerosis, HTN and pre-DM). Continue apixaban and verapamil.     Hypocalcemia Will follow-up with primary care provider.     Normocytic anemia Monitor hematocrit and hemoglobin.      Consultants: Lennie Odor, MD. Procedures performed: None. Disposition: Home Diet recommendation:  Discharge Diet Orders (From admission, onward)     Start     Ordered   11/06/22 0000  Diet - low sodium heart healthy        11/06/22 1342           Cardiac diet DISCHARGE MEDICATION: Allergies as of 11/06/2022       Reactions   Ace Inhibitors Cough   Lisinopril caused cough   Pork-derived Products         Medication  List     STOP taking these medications    doxycycline 100 MG tablet Commonly known as: VIBRA-TABS       TAKE these medications    Eliquis 5 MG Tabs tablet Generic drug: apixaban TAKE 1 TABLET BY MOUTH TWICE A DAY   hydrochlorothiazide 25 MG tablet Commonly known as: HYDRODIURIL Take 25 mg by mouth daily as needed (swelling).    ipratropium 0.06 % nasal spray Commonly known as: ATROVENT Place 1-2 sprays into both nostrils 4 (four) times daily. As needed for nasal congestion What changed:  when to take this additional instructions   losartan 100 MG tablet Commonly known as: COZAAR Take 1 tablet (100 mg total) by mouth daily.   pantoprazole 40 MG tablet Commonly known as: PROTONIX Take 1 tablet (40 mg total) by mouth daily. Start taking on: November 07, 2022   pravastatin 20 MG tablet Commonly known as: PRAVACHOL Take 20 mg by mouth daily. What changed: Another medication with the same name was removed. Continue taking this medication, and follow the directions you see here.   verapamil 120 MG CR tablet Commonly known as: CALAN-SR Take 1 tablet (120 mg total) by mouth at bedtime.   Vitamin D3 50 MCG (2000 UT) Tabs Take one tablet by mouth daily.        Discharge Exam: Filed Weights   11/06/22 0034  Weight: 111.1 kg     Physical Exam:       Vitals:    11/06/22 0045 11/06/22 0300 11/06/22 0315 11/06/22 0503  BP: (!) 177/86 130/74 130/84 129/84  Pulse: 91 75 65 62  Resp: 20 20 17 18   Temp:       97.8 F (36.6 C)  TempSrc:       Oral  SpO2: 99% 96% 95% 100%  Weight:          Height:            Physical Exam Vitals and nursing note reviewed.  Constitutional:      General: He is awake. He is not in acute distress.    Appearance: He is well-developed. He is obese.  HENT:     Head: Normocephalic.     Nose: No rhinorrhea.     Mouth/Throat:     Mouth: Mucous membranes are moist.  Eyes:     General: No scleral icterus.    Pupils: Pupils are equal, round, and reactive to light.  Neck:     Vascular: No JVD.  Cardiovascular:     Rate and Rhythm: Normal rate and regular rhythm.     Heart sounds: S1 normal and S2 normal.  Pulmonary:     Breath sounds: Normal breath sounds. No decreased breath sounds, wheezing or rhonchi.  Abdominal:     General: Bowel sounds are normal. There is no  distension.     Palpations: Abdomen is soft.     Tenderness: There is no abdominal tenderness. There is no guarding.  Musculoskeletal:     Cervical back: Neck supple.     Right lower leg: No edema.     Left lower leg: No edema.  Skin:    General: Skin is warm and dry.  Neurological:     General: No focal deficit present.     Mental Status: He is alert and oriented to person, place, and time.  Psychiatric:        Mood and Affect: Mood normal.        Behavior: Behavior normal. Behavior is cooperative.  Condition at discharge: good  The results of significant diagnostics from this hospitalization (including imaging, microbiology, ancillary and laboratory) are listed below for reference.   Imaging Studies: DG Chest Port 1 View  Result Date: 11/06/2022 CLINICAL DATA:  Chest pain that radiates to left shoulder blade, palpitations, shortness of breath EXAM: PORTABLE CHEST 1 VIEW COMPARISON:  06/03/2022 FINDINGS: Low lung volumes. Unchanged cardiac and mediastinal contours when accounting for low lung volumes and AP technique. No focal pulmonary opacity. No pleural effusion or pneumothorax. No acute osseous abnormality. IMPRESSION: Low lung volumes. No acute cardiopulmonary process. Electronically Signed   By: Wiliam Ke M.D.   On: 11/06/2022 01:01    Microbiology: Results for orders placed or performed during the hospital encounter of 06/03/22  MRSA Next Gen by PCR, Nasal     Status: None   Collection Time: 06/04/22  5:16 AM   Specimen: Nasal Mucosa; Nasal Swab  Result Value Ref Range Status   MRSA by PCR Next Gen NOT DETECTED NOT DETECTED Final    Comment: (NOTE) The GeneXpert MRSA Assay (FDA approved for NASAL specimens only), is one component of a comprehensive MRSA colonization surveillance program. It is not intended to diagnose MRSA infection nor to guide or monitor treatment for MRSA infections. Test performance is not FDA approved in patients less than 73  years old. Performed at [facility], [address] [place], [place] [address]     Labs: CBC: Recent Labs  Lab 11/06/22 0103  WBC 5.9  HGB 12.7*  HCT 39.5  MCV 85.9  PLT 276   Basic Metabolic Panel: Recent Labs  Lab 11/06/22 0103  NA 138  K 3.2*  CL 107  CO2 22  GLUCOSE 184*  BUN 21*  CREATININE 0.90  CALCIUM 8.8*   Liver Function Tests: No results for input(s): "AST", "ALT", "ALKPHOS", "BILITOT", "PROT", "ALBUMIN" in the last 168 hours. CBG: Recent Labs  Lab 11/06/22 0805 11/06/22 1125  GLUCAP 111* 97    Discharge time spent: less than 30 minutes.  Signed: Bobette Mo, MD [facility] 11/06/2022  This document was prepared using Dragon voice recognition software and may contain some unintended transcription errors.

## 2022-11-06 NOTE — Consult Note (Signed)
Cardiology Consultation   Patient ID: Donald [name] MRN: [id number]; DOB: 06-27-1970  Admit date: 11/06/2022 Date of Consult: 11/06/2022  PCP:  Shade Flood, MD   [facility] Providers Cardiologist:  Charlton Haws, MD    Patient Profile:   Donald [name] is a 52 y.o. male with a hx of paroxysmal atrial fibrillation, HTN, HLD, RBBB, nonobstructive CAD on cath in 2017, GERD who is being seen 11/06/2022 for the evaluation of chest pain at the request of Dr. Robb Matar.  History of Present Illness:   [name] is a 52 year old male with above medical history who has been followed by Dr. Eden Emms.  Per chart review, patient previously underwent cardiac catheterization on 01/19/16 that showed mild, nonobstructive CAD with EF greater than 65% by visual estimate.  It was recommended that patient be evaluated for noncardiac causes of chest pain.  Later, nuclear stress test on 07/11/2019 was a normal, low risk study without evidence of ischemia or infarction.  Patient was admitted to [facility] on 06/03/2022 with new onset atrial fibrillation.  Patient was started on heparin drip, Cardizem drip.  He converted to normal sinus rhythm on Cardizem drip and was discharged on diltiazem 120 mg daily and Eliquis 5 mg twice daily.  He underwent echocardiogram on 07/03/2022 that showed EF 60-65%, no regional wall motion abnormalities, normal LV diastolic parameters, normal RV systolic function.  There was mild dilatation of the aortic root measuring 38 mm.  Patient also wore a 2-week ZIO monitor that was resulted on 07/12/2022 and showed predominantly normal sinus rhythm, 10 SVT episodes longest lasting 14 seconds, less than 1% ventricular and supraventricular ectopy.  There were no episodes of atrial fibrillation.   Patient presented to the ED on 6/3 complaining of chest pain that radiated to his left shoulder blade, palpitations, and shortness of breath that started suddenly while at rest 2  hours prior to presentation.  Initial vital signs showed heart rate 102 bpm, BP 205/91, oxygen saturation 100% on room air.  Labs in the ED showed K3.2, creatinine 0.90, WBC 5.9, hemoglobin 12.7, platelets 276.  High-sensitivity troponin 2, 6.  Chest x-ray with low lung volumes, no acute cardiopulmonary process. EKG showed sinus tachycardia with a right bundle branch block, heart rate 101 bpm.  Patient was admitted to the internal medicine service for management of HTN urgency. Cardiology asked to consult.   On interview, patient reports that he was able to work yesterday without any symptoms.  He ate dinner and went to bed.  Reports he had a meal that had a lot of sugar in it.  While laying flat, he started to feel a burning in the left side of his chest that radiated to his left shoulder blade.  Burning sensation seemed to improve if he laid on his right side.  He also noted that his blood pressure was very high.  He came to the ED where he was given sublingual nitroglycerin. This somewhat improved the pain, but did not completely alleviate it. Denies palpitations, shortness of breath. Patient denies recent chest pain or shortness of breath on exertion.  Reports that he is able to walk pretty fast without developing chest pain.    Past Medical History:  Diagnosis Date   Blood transfusion without reported diagnosis    Chronic bilateral low back pain without sciatica 08/19/2020   DEPRESSION, MILD    Dysuria    G E R D    Hypercholesterolemia    Hyperlipidemia  NOT TAKING MEDICATION   Hypertension    Lumbago    NEPHROLITHIASIS, HX OF    PARASOMNIA    Paroxysmal atrial fibrillation (HCC) 07/10/2022   Prediabetes 08/19/2020   Right bundle branch block     Past Surgical History:  Procedure Laterality Date   CARDIAC CATHETERIZATION N/A 01/19/2016   Procedure: Left Heart Cath and Coronary Angiography;  Surgeon: Tonny Bollman, MD;  Location: [facility] INVASIVE CV LAB;  Service: Cardiovascular;   Laterality: N/A;   HAND SURGERY     right and left hand       Home Medications:  Prior to Admission medications   Medication Sig Start Date End Date Taking? Authorizing Provider  ELIQUIS 5 MG TABS tablet TAKE 1 TABLET BY MOUTH TWICE A DAY 10/02/22  Yes Wendall Stade, MD  hydrochlorothiazide (HYDRODIURIL) 25 MG tablet Take 25 mg by mouth daily as needed (swelling). 10/09/22  Yes [provider]  ipratropium (ATROVENT) 0.06 % nasal spray Place 1-2 sprays into both nostrils 4 (four) times daily. As needed for nasal congestion Patient taking differently: Place 1-2 sprays into both nostrils 3 (three) times daily. 10/09/22  Yes Shade Flood, MD  losartan (COZAAR) 100 MG tablet Take 1 tablet (100 mg total) by mouth daily. 10/09/22  Yes Shade Flood, MD  pravastatin (PRAVACHOL) 20 MG tablet Take 20 mg by mouth daily.   Yes [provider]  verapamil (CALAN-SR) 120 MG CR tablet Take 1 tablet (120 mg total) by mouth at bedtime. 07/03/22  Yes Wendall Stade, MD  Cholecalciferol (VITAMIN D3) 50 MCG (2000 UT) TABS Take one tablet by mouth daily. Patient not taking: Reported on 11/06/2022 04/25/21   Shade Flood, MD  doxycycline (VIBRA-TABS) 100 MG tablet Take 1 tablet (100 mg total) by mouth 2 (two) times daily. Patient not taking: Reported on 11/06/2022 10/09/22   Shade Flood, MD  pravastatin (PRAVACHOL) 40 MG tablet Take 1 tablet (40 mg total) by mouth daily. Patient not taking: Reported on 11/06/2022 10/17/22   Shade Flood, MD    Inpatient Medications: Scheduled Meds:  apixaban  5 mg Oral BID   insulin aspart  0-5 Units Subcutaneous QHS   insulin aspart  0-9 Units Subcutaneous TID WC   losartan  100 mg Oral Daily   pravastatin  20 mg Oral Daily   verapamil  120 mg Oral QHS   Continuous Infusions:  PRN Meds: acetaminophen, ipratropium, nitroGLYCERIN, ondansetron (ZOFRAN) IV  Allergies:    Allergies  Allergen Reactions   Ace Inhibitors Cough    Lisinopril  caused cough   Pork-Derived Products     Social History:   Social History   Socioeconomic History   Marital status: Married    Spouse name: Not on file   Number of children: 6   Years of education: 15   Highest education level: Not on file  Occupational History   Occupation: Location manager    Employer: [employer]  Tobacco Use   Smoking status: Former    Types: Cigarettes   Smokeless tobacco: Never  Substance and Sexual Activity   Alcohol use: No    Alcohol/week: 0.0 standard drinks of alcohol   Drug use: No   Sexual activity: Not on file  Other Topics Concern   Not on file  Social History Narrative   ** Merged History Encounter **       Social Determinants of Health   Financial Resource Strain: Not on file  Food Insecurity:  No Food Insecurity (11/06/2022)   Hunger Vital Sign    Worried About Running Out of Food in the Last Year: Never true    Ran Out of Food in the Last Year: Never true  Transportation Needs: No Transportation Needs (11/06/2022)   PRAPARE - Administrator, Civil Service (Medical): No    Lack of Transportation (Non-Medical): No  Physical Activity: Not on file  Stress: Not on file  Social Connections: Not on file  Intimate Partner Violence: Not At Risk (11/06/2022)   Humiliation, Afraid, Rape, and Kick questionnaire    Fear of Current or Ex-Partner: No    Emotionally Abused: No    Physically Abused: No    Sexually Abused: No    Family History:    Family History  Problem Relation Age of Onset   Hypertension Mother    Hypertension Sister      ROS:  Please see the history of present illness.   All other ROS reviewed and negative.     Physical Exam/Data:   Vitals:   11/06/22 0300 11/06/22 0315 11/06/22 0503 11/06/22 0904  BP: 130/74 130/84 129/84 137/77  Pulse: 75 65 62 81  Resp: 20 17 18 18   Temp:   97.8 F (36.6 C) 98.1 F (36.7 C)  TempSrc:   Oral Oral  SpO2: 96% 95% 100% 100%  Weight:      Height:         Intake/Output Summary (Last 24 hours) at 11/06/2022 0952 Last data filed at 11/06/2022 0600 Gross per 24 hour  Intake 120 ml  Output --  Net 120 ml      11/06/2022   12:34 AM 10/09/2022    8:22 AM 07/10/2022   10:13 AM  Last 3 Weights  Weight (lbs) 245 lb 243 lb 12.8 oz 237 lb 12.8 oz  Weight (kg) 111.131 kg 110.587 kg 107.865 kg     Body mass index is 31.46 kg/m.  General:  Well nourished, well developed, in no acute distress. Laying flat in the bed  HEENT: normal Neck: no JVD Vascular: Radial pulses 2+ bilaterally Cardiac:  normal S1, S2; RRR; no murmur  Lungs:  clear to auscultation bilaterally, no wheezing, rhonchi or rales. Normal work of breathing on room air   Abd: soft, nontender Ext: no edema in BLE Musculoskeletal:  No deformities, BUE and BLE strength normal and equal Skin: warm and dry  Neuro:  CNs 2-12 intact, no focal abnormalities noted Psych:  Normal affect   EKG:  The EKG was personally reviewed and demonstrates:  sinus tachycardia, HR 1010 BPM, RBBB  Telemetry:  Telemetry was personally reviewed and demonstrates:  NSR, HR in the 50s-70s   Relevant CV Studies:  Cardiac Studies & Procedures   CARDIAC CATHETERIZATION  CARDIAC CATHETERIZATION 01/19/2016  Narrative  Ost Ramus lesion, 40 %stenosed.  There is hyperdynamic left ventricular systolic function.  LV end diastolic pressure is normal.  The left ventricular ejection fraction is greater than 65% by visual estimate.  1. Mild nonobstructive CAD 2. Hyperdynamic LV function  Suspect noncardiac cause of chest pain  Findings Coronary Findings Diagnostic  Dominance: Right  Left Anterior Descending The vessel exhibits minimal luminal irregularities.  Ramus Intermedius mild ostial ramus stenosis  Left Circumflex The vessel exhibits minimal luminal irregularities.  Right Coronary Artery The vessel exhibits minimal luminal irregularities.  Intervention  No interventions have been  documented.   STRESS TESTS  MYOCARDIAL PERFUSION IMAGING 07/11/2019  Narrative  Nuclear stress EF: 65%.  There was no ST segment deviation noted during stress.  The study is normal.  The left ventricular ejection fraction is normal (55-65%).  Normal stress nuclear study with no ischemia or infarction.  Gated ejection fraction 65% with normal wall motion. Olga Millers   ECHOCARDIOGRAM  ECHOCARDIOGRAM COMPLETE 07/03/2022  Narrative ECHOCARDIOGRAM REPORT    Patient Name:   [facility] Date of Exam: 07/03/2022 Medical Rec #:  [id number]         Height:       74.0 in Accession #:    [id number]        Weight:       239.6 lb Date of Birth:  [birth year]         BSA:          2.347 m Patient Age:    52 years          BP:           159/100 mmHg Patient Gender: M                 HR:           70 bpm. Exam Location:  [address]  Procedure: 2D Echo, 3D Echo, Cardiac Doppler and Color Doppler  Indications:    I48.91 Atrial Fibrillation  History:        Patient has no prior history of Echocardiogram examinations. Arrythmias:RBBB, Signs/Symptoms:Chest Pain; Risk Factors:Hypertension and HLD.  Sonographer:    Clearence Ped RCS Referring Phys: [id number] MARK C SKAINS  IMPRESSIONS   1. Left ventricular ejection fraction, by estimation, is 60 to 65%. The left ventricle has normal function. The left ventricle has no regional wall motion abnormalities. Left ventricular diastolic parameters were normal. 2. Right ventricular systolic function is normal. The right ventricular size is normal. 3. Left atrial size was mildly dilated. 4. The mitral valve is abnormal. Trivial mitral valve regurgitation. No evidence of mitral stenosis. 5. The aortic valve is tricuspid. Aortic valve regurgitation is not visualized. No aortic stenosis is present. 6. Aortic dilatation noted. There is mild dilatation of the aortic root, measuring 38 mm. 7. The inferior vena cava is normal in size with greater  than 50% respiratory variability, suggesting right atrial pressure of 3 mmHg.  FINDINGS Left Ventricle: Left ventricular ejection fraction, by estimation, is 60 to 65%. The left ventricle has normal function. The left ventricle has no regional wall motion abnormalities. The left ventricular internal cavity size was normal in size. There is no left ventricular hypertrophy. Left ventricular diastolic parameters were normal.  Right Ventricle: The right ventricular size is normal. No increase in right ventricular wall thickness. Right ventricular systolic function is normal.  Left Atrium: Left atrial size was mildly dilated.  Right Atrium: Right atrial size was normal in size.  Pericardium: There is no evidence of pericardial effusion.  Mitral Valve: The mitral valve is abnormal. There is mild thickening of the mitral valve leaflet(s). Trivial mitral valve regurgitation. No evidence of mitral valve stenosis.  Tricuspid Valve: The tricuspid valve is normal in structure. Tricuspid valve regurgitation is not demonstrated. No evidence of tricuspid stenosis.  Aortic Valve: The aortic valve is tricuspid. Aortic valve regurgitation is not visualized. No aortic stenosis is present.  Pulmonic Valve: The pulmonic valve was normal in structure. Pulmonic valve regurgitation is not visualized. No evidence of pulmonic stenosis.  Aorta: Aortic dilatation noted. There is mild dilatation of the aortic root, measuring 38 mm.  Venous: The inferior vena cava is normal in  size with greater than 50% respiratory variability, suggesting right atrial pressure of 3 mmHg.  IAS/Shunts: No atrial level shunt detected by color flow Doppler.   LEFT VENTRICLE PLAX 2D LVIDd:         5.10 cm   Diastology LVIDs:         3.30 cm   LV e' medial:    10.70 cm/s LV PW:         1.30 cm   LV E/e' medial:  8.3 LV IVS:        1.00 cm   LV e' lateral:   12.40 cm/s LVOT diam:     2.30 cm   LV E/e' lateral: 7.2 LV SV:          95 LV SV Index:   40 LVOT Area:     4.15 cm  3D Volume EF: 3D EF:        63 % LV EDV:       153 ml LV ESV:       57 ml LV SV:        96 ml  RIGHT VENTRICLE RV Basal diam:  2.60 cm RV S prime:     13.60 cm/s TAPSE (M-mode): 2.1 cm  LEFT ATRIUM             Index        RIGHT ATRIUM           Index LA diam:        3.90 cm 1.66 cm/m   RA Area:     11.10 cm LA Vol (A2C):   74.6 ml 31.79 ml/m  RA Volume:   21.90 ml  9.33 ml/m LA Vol (A4C):   56.6 ml 24.12 ml/m LA Biplane Vol: 68.3 ml 29.10 ml/m AORTIC VALVE LVOT Vmax:   102.40 cm/s LVOT Vmean:  65.200 cm/s LVOT VTI:    0.228 m  AORTA Ao Root diam: 3.80 cm Ao Asc diam:  3.30 cm  MITRAL VALVE MV Area (PHT):             SHUNTS MV Decel Time:             Systemic VTI:  0.23 m MV E velocity: 89.10 cm/s  Systemic Diam: 2.30 cm MV A velocity: 74.40 cm/s MV E/A ratio:  1.20  Charlton Haws MD Electronically signed by Charlton Haws MD Signature Date/Time: 07/03/2022/9:17:06 AM    Final    MONITORS  LONG TERM MONITOR (3-14 DAYS) 07/12/2022  Narrative Patch Wear Time:  13 days and 23 hours  Predominant rhythm was sinus rhythm 10 SVT episodes, longest 14 seconds, fastest 171 bpm Less than 1% ventricular and supraventricular ectopy Triggered episodes associated with sinus rhythm  Will Camnitz, MD            Laboratory Data:  High Sensitivity Troponin:   Recent Labs  Lab 11/06/22 0103 11/06/22 0229  TROPONINIHS 2 6     Chemistry Recent Labs  Lab 11/06/22 0103  NA 138  K 3.2*  CL 107  CO2 22  GLUCOSE 184*  BUN 21*  CREATININE 0.90  CALCIUM 8.8*  GFRNONAA >60  ANIONGAP 9    No results for input(s): "PROT", "ALBUMIN", "AST", "ALT", "ALKPHOS", "BILITOT" in the last 168 hours. Lipids No results for input(s): "CHOL", "TRIG", "HDL", "LABVLDL", "LDLCALC", "CHOLHDL" in the last 168 hours.  Hematology Recent Labs  Lab 11/06/22 0103  WBC 5.9  RBC 4.60  HGB 12.7*  HCT 39.5  MCV 85.9  MCH 27.6  MCHC  32.2  RDW 14.8  PLT 276   Thyroid No results for input(s): "TSH", "FREET4" in the last 168 hours.  BNPNo results for input(s): "BNP", "PROBNP" in the last 168 hours.  DDimer No results for input(s): "DDIMER" in the last 168 hours.   Radiology/Studies:  DG Chest Port 1 View  Result Date: 11/06/2022 CLINICAL DATA:  Chest pain that radiates to left shoulder blade, palpitations, shortness of breath EXAM: PORTABLE CHEST 1 VIEW COMPARISON:  06/03/2022 FINDINGS: Low lung volumes. Unchanged cardiac and mediastinal contours when accounting for low lung volumes and AP technique. No focal pulmonary opacity. No pleural effusion or pneumothorax. No acute osseous abnormality. IMPRESSION: Low lung volumes. No acute cardiopulmonary process. Electronically Signed   By: Wiliam Ke M.D.   On: 11/06/2022 01:01     Assessment and Plan:   Chest Pain  - In the past, patient had a left heart catheterization in 2017 that showed mild, nonobstructive CAD. Nuclear stress test in 2021 was a low risk study without evidence of ischemia or infarction  -Patient presented to the ED complaining of 2+ hours of burning, left-sided chest pain.  Pain improved if he laid on his right side. Not relieved by SL nitro  - High-sensitivity troponin negative x 2 in the ED - Patient denies recent chest pain or SOB on exertion  - No need for inpatient cardiac workup given negative hsTn x2 after hours of chest discomfort. Suspect patient had GERD as his burning chest pain started after eating a meal, was worse with laying flat, and improved with positional changes - Recommend PPI. Will arrange cardiac follow up   HTN  - BP was as high as 205/91 on presentation. Now improved to 137/77 - Continue losartan 100 mg daily, verapamil 120 mg daily   Paroxysmal Afib  - Maintaining nsr  - Continue verapamil 120 mg daily and eliquis 5 mg BID    Risk Assessment/Risk Scores:    CHA2DS2-VASc Score = 1   This indicates a 0.6% annual risk of  stroke. The patient's score is based upon: CHF History: 0 HTN History: 1 Diabetes History: 0 Stroke History: 0 Vascular Disease History: 0 Age Score: 0 Gender Score: 0        For questions or updates, please contact [facility] Please consult [email or web address] for contact info under    Signed, Jonita Albee, PA-C  11/06/2022 9:52 AM

## 2022-11-06 NOTE — ED Triage Notes (Addendum)
Pt arrives POV c/o CP that radiates to his L shoulder blade, palpitations and SOB that started suddenly while at rest 2 hours ago. Hx of irregular heart beat - on eliquis. Hypertensive in triage - took BP meds tonight.

## 2022-11-06 NOTE — Plan of Care (Signed)

## 2022-11-06 NOTE — Progress Notes (Signed)
Patient discharged home with family, discharged instructions given and explained to patient he verbalized understanding, patient denies any pain/distress. Accompanied home by wife.

## 2022-11-06 NOTE — TOC Initial Note (Signed)
Transition of Care [facility]) - Initial/Assessment Note    Patient Details  Name: Donald [name] MRN: [id number] Date of Birth: 1971/04/15  [facility] CM/SW Contact:    Lanier Clam, RN Phone Number: 11/06/2022, 12:53 PM  Clinical Narrative: Follow for d/c needs.                  Expected Discharge Plan: Home/Self Care Barriers to Discharge: No Barriers Identified   Patient Goals and CMS Choice Patient states their goals for this hospitalization and ongoing recovery are:: Home CMS Medicare.gov Compare Post Acute Care list provided to:: Patient Choice offered to / list presented to : Patient [place] ownership interest in [facility].provided to:: Patient    Expected Discharge Plan and Services   Discharge Planning Services: CM Consult   Living arrangements for the past 2 months: Single Family Home                                      Prior Living Arrangements/Services Living arrangements for the past 2 months: Single Family Home Lives with:: Spouse Patient language and need for interpreter reviewed:: Yes Do you feel safe going back to the place where you live?: Yes      Need for Family Participation in Patient Care: Yes (Comment) Care giver support system in place?: Yes (comment)   Criminal Activity/Legal Involvement Pertinent to Current Situation/Hospitalization: No - Comment as needed  Activities of Daily Living Home Assistive Devices/Equipment: None ADL Screening (condition at time of admission) Patient's cognitive ability adequate to safely complete daily activities?: Yes Is the patient deaf or have difficulty hearing?: No Does the patient have difficulty seeing, even when wearing glasses/contacts?: No Does the patient have difficulty concentrating, remembering, or making decisions?: No Patient able to express need for assistance with ADLs?: Yes Does the patient have difficulty dressing or bathing?: No Independently performs  ADLs?: Yes (appropriate for developmental age) Does the patient have difficulty walking or climbing stairs?: No Weakness of Legs: None Weakness of Arms/Hands: None  Permission Sought/Granted Permission sought to share information with : Case Manager Permission granted to share information with : Yes, Verbal Permission Granted  Share Information with NAME: Case manager           Emotional Assessment Appearance:: Appears stated age Attitude/Demeanor/Rapport: Gracious Affect (typically observed): Accepting Orientation: : Oriented to Self, Oriented to Place, Oriented to  Time, Oriented to Situation Alcohol / Substance Use: Not Applicable Psych Involvement: No (comment)  Admission diagnosis:  Chest pain [R07.9] Chest pain, unspecified type [R07.9] Patient Active Problem List   Diagnosis Date Noted   Chest pain 11/06/2022   Hypocalcemia 11/06/2022   Normocytic anemia 11/06/2022   Paroxysmal atrial fibrillation (HCC) 07/10/2022   Atrial fibrillation with RVR (HCC) 06/03/2022   Atypical chest pain 04/02/2021   HLD (hyperlipidemia) 08/25/2020   Encounter for general adult medical examination with abnormal findings 08/19/2020   Prediabetes 08/19/2020   Screen for colon cancer 08/19/2020   Diuretic-induced hypokalemia 08/19/2020   Chronic bilateral low back pain without sciatica 08/19/2020   Hypokalemia 01/20/2016   Right bundle branch block 03/11/2010   Essential hypertension 05/14/2009   PARASOMNIA 05/14/2009   G E R D 05/13/2009   PCP:  Shade Flood, MD Pharmacy:   [id number] [place], [place] - [address] [place] [address] Phone: [phone] Fax: [phone]  Social Determinants of Health (SDOH) Social History: SDOH Screenings   Food Insecurity: No Food Insecurity (11/06/2022)  Housing: Low Risk  (11/06/2022)  Transportation Needs: No Transportation Needs (11/06/2022)  Utilities: Not At Risk (11/06/2022)  Depression (PHQ2-9): Low  Risk  (10/09/2022)  Tobacco Use: Medium Risk (11/06/2022)   SDOH Interventions:     Readmission Risk Interventions     No data to display

## 2022-11-07 ENCOUNTER — Telehealth: Payer: Self-pay

## 2022-11-07 NOTE — Transitions of Care (Post Inpatient/ED Visit) (Signed)
   11/07/2022  Name: Donald [name] MRN: [id number] DOB: March 04, 1971  Today's TOC FU Call Status: Today's TOC FU Call Status:: Successful TOC FU Call Competed TOC FU Call Complete Date: 11/07/22  Transition Care Management Follow-up Telephone Call Date of Discharge: 11/06/22 Discharge Facility: [facility]) Type of Discharge: Inpatient Admission Primary Inpatient Discharge Diagnosis:: Essential hypertension How have you been since you were released from the hospital?: Better Any questions or concerns?: No  Items Reviewed: Did you receive and understand the discharge instructions provided?: Yes Medications obtained,verified, and reconciled?: Yes (Medications Reviewed) Any new allergies since your discharge?: No Dietary orders reviewed?: Yes Do you have support at home?: Yes  Medications Reviewed Today: Medications Reviewed Today     Reviewed by Merleen Nicely, LPN (Licensed Practical Nurse) on 11/07/22 at 1105  Med List Status: <None>   Medication Order Taking? Sig Documenting Provider Last Dose Status Informant  Cholecalciferol (VITAMIN D3) 50 MCG (2000 UT) TABS [id number] Yes Take one tablet by mouth daily. Shade Flood, MD Taking Active Self  ELIQUIS 5 MG TABS tablet [id number] Yes TAKE 1 TABLET BY MOUTH TWICE A DAY Wendall Stade, MD Taking Active Self  hydrochlorothiazide (HYDRODIURIL) 25 MG tablet [id number] Yes Take 25 mg by mouth daily as needed (swelling). [provider] Taking Active Self  ipratropium (ATROVENT) 0.06 % nasal spray [id number] Yes Place 1-2 sprays into both nostrils 4 (four) times daily. As needed for nasal congestion  Patient taking differently: Place 1-2 sprays into both nostrils 3 (three) times daily.   Shade Flood, MD Taking Active Self  losartan (COZAAR) 100 MG tablet [id number] Yes Take 1 tablet (100 mg total) by mouth daily. Shade Flood, MD Taking Active Self  pantoprazole (PROTONIX) 40 MG tablet [id number] Yes Take 1  tablet (40 mg total) by mouth daily. Bobette Mo, MD Taking Active   pravastatin (PRAVACHOL) 20 MG tablet [id number] Yes Take 20 mg by mouth daily. [provider] Taking Active Self  verapamil (CALAN-SR) 120 MG CR tablet [id number] Yes Take 1 tablet (120 mg total) by mouth at bedtime. Wendall Stade, MD Taking Active Self            Home Care and Equipment/Supplies: Were Home Health Services Ordered?: No Any new equipment or medical supplies ordered?: No  Functional Questionnaire: Do you need assistance with bathing/showering or dressing?: No Do you need assistance with meal preparation?: No Do you need assistance with eating?: No Do you have difficulty maintaining continence: No Do you need assistance with getting out of bed/getting out of a chair/moving?: No Do you have difficulty managing or taking your medications?: No  Follow up appointments reviewed: PCP Follow-up appointment confirmed?: Yes Date of PCP follow-up appointment?: 11/13/22 Follow-up Provider: Dr Neva [facility] Follow-up appointment confirmed?: Yes Date of Specialist follow-up appointment?: 11/20/22 Follow-Up Specialty Provider:: Dr Alben Spittle Do you need transportation to your follow-up appointment?: No Do you understand care options if your condition(s) worsen?: Yes-patient verbalized understanding    SIGNATURE  Woodfin Ganja LPN [facility] Nurse Health Advisor Direct Dial [phone]

## 2022-11-13 ENCOUNTER — Encounter: Payer: Self-pay | Admitting: Family Medicine

## 2022-11-13 ENCOUNTER — Ambulatory Visit: Payer: No Typology Code available for payment source | Admitting: Family Medicine

## 2022-11-13 VITALS — BP 138/76 | HR 62 | Temp 98.1°F | Ht 74.0 in | Wt 244.2 lb

## 2022-11-13 DIAGNOSIS — D649 Anemia, unspecified: Secondary | ICD-10-CM | POA: Diagnosis not present

## 2022-11-13 DIAGNOSIS — R079 Chest pain, unspecified: Secondary | ICD-10-CM | POA: Diagnosis not present

## 2022-11-13 DIAGNOSIS — E876 Hypokalemia: Secondary | ICD-10-CM

## 2022-11-13 DIAGNOSIS — R12 Heartburn: Secondary | ICD-10-CM

## 2022-11-13 LAB — BASIC METABOLIC PANEL
BUN: 14 mg/dL (ref 6–23)
CO2: 28 mEq/L (ref 19–32)
Calcium: 9.5 mg/dL (ref 8.4–10.5)
Chloride: 100 mEq/L (ref 96–112)
Creatinine, Ser: 0.92 mg/dL (ref 0.40–1.50)
GFR: 94.4 mL/min (ref >60.00)
Glucose, Bld: 81 mg/dL (ref 70–99)
Potassium: 4 mEq/L (ref 3.5–5.1)
Sodium: 137 mEq/L (ref 135–145)

## 2022-11-13 NOTE — Patient Instructions (Addendum)
Add pepcid or zantac once in the evening for possible heartburn. Continue pantoprazole daily - 30 minutes prior to meal in the morning. If burning sensation does not resolve, return to discuss other causes.   I will recheck labs today.  Return to the clinic or go to the nearest emergency room if any of your symptoms worsen or new symptoms occur.

## 2022-11-13 NOTE — Progress Notes (Signed)
Subjective:  Patient ID: Donald [name], male    DOB: July 31, 1970  Age: 52 y.o. MRN: [id number]  CC:  Chief Complaint  Patient presents with   Hospitalization Follow-up    HPI Donald [name] presents for   Transition of care visit.  He is being seen in person today. TOC phone call noted from 11/07/2022.  No concerns identified at that time. Admitted and discharged 11/06/2022, management of chest pain and hypokalemia.  Chest pain Discharge summary reviewed.  Presented to the ER with sudden onset chest pain at rest, 2 hours preceding ER visit.  Pain radiated to his left shoulder and associated with palpitations and dyspnea.  He was given aspirin and potassium in the ER.  Troponin x 2 were normal.  Initial potassium of 3.2.  Chest x-ray with low lung volumes but no acute cardiopulmonary process.  Pain was atypical, was observed on telemetry.  Cardiology consult, atypical pain and thought to be GI in nature, recommended PPI therapy and no need for further inpatient cardiac workup.  He was continued on his verapamil losartan for hypertension, pantoprazole for GERD, hypokalemia treated with potassium in the ER.  Continued on Icthaband for history of A-fib.  Hypocalcemia noted, plan for PCP follow-up as well as his normocytic anemia.  Feeling better since leaving hospital. No further chest pain, but notes intermittent warm sensation, 2 times per day- usually in evening or with lying down. . Lasts 1 hour. No belching/burping. No associated dyspnea or diaphoresis. No heartburn. No palpitations.  Taking pantoprozole daily.  Taking otc potassium supplement - 2 per day.  Calcium 8.8 on 11/06/22.   No melena/hematochezia. No lightheadedness/dizziness.   Lab Results  Component Value Date   NA 138 11/06/2022   K 3.2 (L) 11/06/2022   CL 107 11/06/2022   CO2 22 11/06/2022   Lab Results  Component Value Date   WBC 5.9 11/06/2022   HGB 12.7 (L) 11/06/2022   HCT 39.5 11/06/2022   MCV 85.9  11/06/2022   PLT 276 11/06/2022    Hyperglycemia on prior labs, not fasting - recent A1c with known prediabetes.  Lab Results  Component Value Date   HGBA1C 6.0 10/09/2022     History Patient Active Problem List   Diagnosis Date Noted   Chest pain 11/06/2022   Hypocalcemia 11/06/2022   Normocytic anemia 11/06/2022   Paroxysmal atrial fibrillation (HCC) 07/10/2022   Atrial fibrillation with RVR (HCC) 06/03/2022   Atypical chest pain 04/02/2021   HLD (hyperlipidemia) 08/25/2020   Encounter for general adult medical examination with abnormal findings 08/19/2020   Prediabetes 08/19/2020   Screen for colon cancer 08/19/2020   Diuretic-induced hypokalemia 08/19/2020   Chronic bilateral low back pain without sciatica 08/19/2020   Hypokalemia 01/20/2016   Right bundle branch block 03/11/2010   Essential hypertension 05/14/2009   PARASOMNIA 05/14/2009   G E R D 05/13/2009   Past Medical History:  Diagnosis Date   Blood transfusion without reported diagnosis    Chronic bilateral low back pain without sciatica 08/19/2020   DEPRESSION, MILD    Dysuria    G E R D    Hypercholesterolemia    Hyperlipidemia    NOT TAKING MEDICATION   Hypertension    Lumbago    NEPHROLITHIASIS, HX OF    PARASOMNIA    Paroxysmal atrial fibrillation (HCC) 07/10/2022   Prediabetes 08/19/2020   Right bundle branch block    Past Surgical History:  Procedure Laterality Date   CARDIAC CATHETERIZATION N/A 01/19/2016  Procedure: Left Heart Cath and Coronary Angiography;  Surgeon: Tonny Bollman, MD;  Location: [facility] INVASIVE CV LAB;  Service: Cardiovascular;  Laterality: N/A;   HAND SURGERY     right and left hand     Allergies  Allergen Reactions   Ace Inhibitors Cough    Lisinopril caused cough   Pork-Derived Products    Prior to Admission medications   Medication Sig Start Date End Date Taking? Authorizing Provider  Cholecalciferol (VITAMIN D3) 50 MCG (2000 UT) TABS Take one tablet by mouth  daily. 04/25/21  Yes Shade Flood, MD  ELIQUIS 5 MG TABS tablet TAKE 1 TABLET BY MOUTH TWICE A DAY 10/02/22  Yes Wendall Stade, MD  hydrochlorothiazide (HYDRODIURIL) 25 MG tablet Take 25 mg by mouth daily as needed (swelling). 10/09/22  Yes [provider]  ipratropium (ATROVENT) 0.06 % nasal spray Place 1-2 sprays into both nostrils 4 (four) times daily. As needed for nasal congestion Patient taking differently: Place 1-2 sprays into both nostrils 3 (three) times daily. 10/09/22  Yes Shade Flood, MD  losartan (COZAAR) 100 MG tablet Take 1 tablet (100 mg total) by mouth daily. 10/09/22  Yes Shade Flood, MD  pravastatin (PRAVACHOL) 20 MG tablet Take 20 mg by mouth daily.   Yes [provider]  verapamil (CALAN-SR) 120 MG CR tablet Take 1 tablet (120 mg total) by mouth at bedtime. 07/03/22  Yes Wendall Stade, MD  pantoprazole (PROTONIX) 40 MG tablet Take 1 tablet (40 mg total) by mouth daily. Patient not taking: Reported on 11/13/2022 11/07/22   Bobette Mo, MD   Social History   Socioeconomic History   Marital status: Married    Spouse name: Not on file   Number of children: 6   Years of education: 15   Highest education level: Not on file  Occupational History   Occupation: Location manager    Employer: [employer]  Tobacco Use   Smoking status: Former    Types: Cigarettes   Smokeless tobacco: Never  Substance and Sexual Activity   Alcohol use: No    Alcohol/week: 0.0 standard drinks of alcohol   Drug use: No   Sexual activity: Not on file  Other Topics Concern   Not on file  Social History Narrative   ** Merged History Encounter **       Social Determinants of Health   Financial Resource Strain: Not on file  Food Insecurity: No Food Insecurity (11/06/2022)   Hunger Vital Sign    Worried About Running Out of Food in the Last Year: Never true    Ran Out of Food in the Last Year: Never true  Transportation Needs: No Transportation Needs  (11/06/2022)   PRAPARE - Administrator, Civil Service (Medical): No    Lack of Transportation (Non-Medical): No  Physical Activity: Not on file  Stress: Not on file  Social Connections: Not on file  Intimate Partner Violence: Not At Risk (11/06/2022)   Humiliation, Afraid, Rape, and Kick questionnaire    Fear of Current or Ex-Partner: No    Emotionally Abused: No    Physically Abused: No    Sexually Abused: No    Review of Systems Per HPI   Objective:   Vitals:   11/13/22 1132  BP: 138/76  Pulse: 62  Temp: 98.1 F (36.7 C)  TempSrc: Temporal  SpO2: 99%  Weight: 244 lb 3.2 oz (110.8 kg)  Height: 6\' 2"  (1.88 m)  Physical Exam Vitals reviewed.  Constitutional:      Appearance: He is well-developed.  HENT:     Head: Normocephalic and atraumatic.  Neck:     Vascular: No carotid bruit or JVD.  Cardiovascular:     Rate and Rhythm: Normal rate and regular rhythm.     Heart sounds: Normal heart sounds. No murmur heard.    Comments: Chest wall nontender.  Pulmonary:     Effort: Pulmonary effort is normal.     Breath sounds: Normal breath sounds. No rales.  Musculoskeletal:     Right lower leg: No edema.     Left lower leg: No edema.  Skin:    General: Skin is warm and dry.  Neurological:     Mental Status: He is alert and oriented to person, place, and time.  Psychiatric:        Mood and Affect: Mood normal.        Assessment & Plan:  Donald [name] is a 52 y.o. male . Chest pain, unspecified type Heartburn  -Reassuring evaluation in the hospital, likely gastrointestinal cause, reflux with some likely intermittent flares lying down or nighttime.  Continued on PPI daily, add H2 blocker in the evening, RTC precautions if symptoms do not improve.  ER precautions given if acute or worsening symptoms.  Hypokalemia - Plan: Basic metabolic panel  -Repeat labs, currently on potassium supplementation over-the-counter.  Hypocalcemia - Plan: Basic  metabolic panel  -Borderline, repeat BMP  Normocytic anemia - Plan: CBC  -Check updated CBC, asymptomatic.  No orders of the defined types were placed in this encounter.  Patient Instructions  Add pepcid or zantac once in the evening for possible heartburn. Continue pantoprazole daily - 30 minutes prior to meal in the morning. If burning sensation does not resolve, return to discuss other causes.   I will recheck labs today.  Return to the clinic or go to the nearest emergency room if any of your symptoms worsen or new symptoms occur.     Signed,   Meredith Staggers, MD [facility], [facility] 11/13/22 12:06 PM

## 2022-11-14 LAB — CBC
HCT: 43.3 % (ref 39.0–52.0)
Hemoglobin: 14.1 g/dL (ref 13.0–17.0)
MCHC: 32.6 g/dL (ref 30.0–36.0)
MCV: 85.5 fl (ref 78.0–100.0)
Platelets: 322 10*3/uL (ref 150.0–400.0)
RBC: 5.06 Mil/uL (ref 4.22–5.81)
RDW: 15.1 % (ref 11.5–15.5)
WBC: 4.9 10*3/uL (ref 4.0–10.5)

## 2022-11-19 DIAGNOSIS — I2510 Atherosclerotic heart disease of native coronary artery without angina pectoris: Secondary | ICD-10-CM | POA: Insufficient documentation

## 2022-11-19 NOTE — Progress Notes (Unsigned)
Cardiology Office Note:    Date:  11/20/2022  ID:  Donald [name], DOB 1970/07/28, MRN [id number] PCP: Shade Flood, MD  [facility] Providers Cardiologist:  Charlton Haws, MD       Patient Profile:      Coronary artery disease, nonobstructive  LHC 01/19/16: oRI 40, EF > 65 Myoview 07/11/19: EF 65, no ischemia or infarction Paroxysmal atrial fibrillation TTE 07/03/22: EF 60-65, no RWMA, NL RVSF, mild LAE, trivial MR, aortic root 38 mm, RAP 3  Right Bundle Branch Block  Supraventricular Tachycardia  Monitor 07/2022: 10 SVT episodes (longest 14 sec) Hypertension  Hyperlipidemia  Hx of L kidney hemorrhagic/proteinaceous cyst 08/2020      History of Present Illness:   Donald [name] is a 52 y.o. male who returns for post hospitalization follow up. He was last seen by Dr. Eden Emms in 06/2022. His Dilt was changed to Verapamil b/c of side effects. He was admitted 11/06/22 with chest pain. He was seen by Cardiology. His Troponins were normal. His symptoms sounded noncardiac. PPI was recommended and no further cardiac testing.  He is here alone.  He is doing well without recurrent chest pain to suggest angina.  He has not had shortness of breath, syncope, orthopnea, leg edema.  He has rare palpitations.  Review of Systems  Gastrointestinal:  Negative for hematochezia and melena.  Genitourinary:  Negative for hematuria.       Studies Reviewed:    EKG:  not done   Risk Assessment/Calculations:    CHA2DS2-VASc Score = 2   This indicates a 2.2% annual risk of stroke. The patient's score is based upon: CHF History: 0 HTN History: 1 Diabetes History: 0 Stroke History: 0 Vascular Disease History: 1 Age Score: 0 Gender Score: 0            Physical Exam:   VS:  BP 124/76   Pulse 72   Ht 6\' 2"  (1.88 m)   Wt 244 lb 3.2 oz (110.8 kg)   SpO2 98%   BMI 31.35 kg/m    Wt Readings from Last 3 Encounters:  11/20/22 244 lb 3.2 oz (110.8 kg)  11/13/22 244 lb 3.2 oz (110.8 kg)   11/06/22 245 lb (111.1 kg)    Constitutional:      Appearance: Healthy appearance. Not in distress.  Neck:     Vascular: JVD normal.  Pulmonary:     Breath sounds: Normal breath sounds. No wheezing. No rales.  Cardiovascular:     Normal rate. Regular rhythm.     Murmurs: There is no murmur.  Edema:    Peripheral edema absent.  Abdominal:     Palpations: Abdomen is soft.       ASSESSMENT AND PLAN:   CAD (coronary artery disease) Mild nonobstructive CAD by cath in 2017. Low risk Myoview in 2021. Recent admit w chest pain and neg workup. His symptoms have resolved on PPI. No further cardiac testing needed. He is not on antiplatelet Rx as he is on Eliquis. Continue Pravastatin. His does was supposed to be increased to 40 mg. I have asked him to go ahead and start on the higher dose.   Paroxysmal atrial fibrillation (HCC) Maintaining NSR by exam. He has rare palpitations. He is tolerating Verapamil well.  Based upon age, weight, creatinine, continue Eliquis 5 mg twice daily.  Continue verapamil SR 120 mg daily.  Essential hypertension Blood pressure well-controlled.  Continue HCTZ 25 mg daily, losartan 100 mg daily, verapamil 120 mg daily.  HLD (hyperlipidemia) Managed by primary care.  Goal LDL should be <70.  LDL was above goal in May at 137.  He has had a history of myalgias with atorvastatin.  His pravastatin was to be increased to 40 mg daily.  There was some confusion when he left the hospital and he was told to continue on 20 mg daily.  I have asked him to go ahead and increase to 40 mg and follow-up with primary care as planned.     Dispo:  Return in about 6 months (around 05/22/2023) for Routine Follow Up w/ Dr. Eden Emms.  Signed, Tereso Newcomer, PA-C

## 2022-11-20 ENCOUNTER — Encounter: Payer: Self-pay | Admitting: Physician Assistant

## 2022-11-20 ENCOUNTER — Ambulatory Visit: Payer: No Typology Code available for payment source | Attending: Physician Assistant | Admitting: Physician Assistant

## 2022-11-20 VITALS — BP 124/76 | HR 72 | Ht 74.0 in | Wt 244.2 lb

## 2022-11-20 DIAGNOSIS — I480 Paroxysmal atrial fibrillation: Secondary | ICD-10-CM | POA: Diagnosis not present

## 2022-11-20 DIAGNOSIS — I2510 Atherosclerotic heart disease of native coronary artery without angina pectoris: Secondary | ICD-10-CM | POA: Diagnosis not present

## 2022-11-20 DIAGNOSIS — E782 Mixed hyperlipidemia: Secondary | ICD-10-CM | POA: Diagnosis not present

## 2022-11-20 DIAGNOSIS — I10 Essential (primary) hypertension: Secondary | ICD-10-CM

## 2022-11-20 NOTE — Assessment & Plan Note (Signed)
Mild nonobstructive CAD by cath in 2017. Low risk Myoview in 2021. Recent admit w chest pain and neg workup. His symptoms have resolved on PPI. No further cardiac testing needed. He is not on antiplatelet Rx as he is on Eliquis. Continue Pravastatin. His does was supposed to be increased to 40 mg. I have asked him to go ahead and start on the higher dose.

## 2022-11-20 NOTE — Assessment & Plan Note (Signed)
Managed by primary care.  Goal LDL should be <70.  LDL was above goal in May at 137.  He has had a history of myalgias with atorvastatin.  His pravastatin was to be increased to 40 mg daily.  There was some confusion when he left the hospital and he was told to continue on 20 mg daily.  I have asked him to go ahead and increase to 40 mg and follow-up with primary care as planned.

## 2022-11-20 NOTE — Assessment & Plan Note (Signed)
Maintaining NSR by exam. He has rare palpitations. He is tolerating Verapamil well.  Based upon age, weight, creatinine, continue Eliquis 5 mg twice daily.  Continue verapamil SR 120 mg daily.

## 2022-11-20 NOTE — Assessment & Plan Note (Signed)
Blood pressure well-controlled.  Continue HCTZ 25 mg daily, losartan 100 mg daily, verapamil 120 mg daily.

## 2022-11-20 NOTE — Patient Instructions (Signed)
Medication Instructions:  Your physician recommends that you continue on your current medications as directed. Please refer to the Current Medication list given to you today.  *If you need a refill on your cardiac medications before your next appointment, please call your pharmacy*   Lab Work: None ordered  If you have labs (blood work) drawn today and your tests are completely normal, you will receive your results only by: MyChart Message (if you have MyChart) OR A paper copy in the mail If you have any lab test that is abnormal or we need to change your treatment, we will call you to review the results.   Testing/Procedures: None ordered   Follow-Up: At [facility], you and your health needs are our priority.  As part of our continuing mission to provide you with exceptional heart care, we have created designated Provider Care Teams.  These Care Teams include your primary Cardiologist (physician) and Advanced Practice Providers (APPs -  Physician Assistants and Nurse Practitioners) who all work together to provide you with the care you need, when you need it.  We recommend signing up for the patient portal called "MyChart".  Sign up information is provided on this After Visit Summary.  MyChart is used to connect with patients for Virtual Visits (Telemedicine).  Patients are able to view lab/test results, encounter notes, upcoming appointments, etc.  Non-urgent messages can be sent to your provider as well.   To learn more about what you can do with MyChart, go to [email or web address].    Your next appointment:   6 month(s)  Provider:   Peter Nishan, MD     Other Instructions 

## 2022-12-21 ENCOUNTER — Ambulatory Visit: Payer: No Typology Code available for payment source | Admitting: Family Medicine

## 2022-12-21 VITALS — BP 128/72 | HR 66 | Temp 98.1°F | Ht 74.0 in | Wt 245.6 lb

## 2022-12-21 DIAGNOSIS — M25511 Pain in right shoulder: Secondary | ICD-10-CM | POA: Diagnosis not present

## 2022-12-21 MED ORDER — DICLOFENAC SODIUM 1 % EX CREA
4.0000 g | TOPICAL_CREAM | Freq: Four times a day (QID) | CUTANEOUS | 1 refills | Status: DC | PRN
Start: 2022-12-21 — End: 2023-09-13

## 2022-12-21 NOTE — Progress Notes (Signed)
Subjective:  Patient ID: Donald [name], male    DOB: 04-10-1971  Age: 52 y.o. MRN: [id number]  CC:  Chief Complaint  Patient presents with   Shoulder Pain    Pt notes shoulder pain started about 3 weeks ago, no known injury, pt notes cannot bring arm above his head, no weakness at this time     HPI Donald [name] presents for   R Shoulder Pain: R shoulder, past 3 weeks, no falls/injury. No preceding change in activity. Pain in top of shoulder, deep in upper arm only with movement.  No neck pain, no lower arm/hand symptoms.  R hand dominant.  Pain limiting motion.  No prior shoulder issues/surgery.   Tx: tylenol- min relief.     History Patient Active Problem List   Diagnosis Date Noted   CAD (coronary artery disease) 11/19/2022   Chest pain 11/06/2022   Hypocalcemia 11/06/2022   Normocytic anemia 11/06/2022   Paroxysmal atrial fibrillation (HCC) 07/10/2022   Atrial fibrillation with RVR (HCC) 06/03/2022   Atypical chest pain 04/02/2021   HLD (hyperlipidemia) 08/25/2020   Encounter for general adult medical examination with abnormal findings 08/19/2020   Prediabetes 08/19/2020   Screen for colon cancer 08/19/2020   Diuretic-induced hypokalemia 08/19/2020   Chronic bilateral low back pain without sciatica 08/19/2020   Hypokalemia 01/20/2016   Right bundle branch block 03/11/2010   Essential hypertension 05/14/2009   PARASOMNIA 05/14/2009   G E R D 05/13/2009   Past Medical History:  Diagnosis Date   Blood transfusion without reported diagnosis    Chronic bilateral low back pain without sciatica 08/19/2020   DEPRESSION, MILD    Dysuria    G E R D    Hypercholesterolemia    Hyperlipidemia    NOT TAKING MEDICATION   Hypertension    Lumbago    NEPHROLITHIASIS, HX OF    PARASOMNIA    Paroxysmal atrial fibrillation (HCC) 07/10/2022   Prediabetes 08/19/2020   Right bundle branch block    Past Surgical History:  Procedure Laterality Date   CARDIAC  CATHETERIZATION N/A 01/19/2016   Procedure: Left Heart Cath and Coronary Angiography;  Surgeon: Tonny Bollman, MD;  Location: [facility] INVASIVE CV LAB;  Service: Cardiovascular;  Laterality: N/A;   HAND SURGERY     right and left hand     Allergies  Allergen Reactions   Ace Inhibitors Cough    Lisinopril caused cough   Pork-Derived Products    Prior to Admission medications   Medication Sig Start Date End Date Taking? Authorizing Provider  Cholecalciferol (VITAMIN D3) 50 MCG (2000 UT) TABS Take one tablet by mouth daily. 04/25/21   Shade Flood, MD  ELIQUIS 5 MG TABS tablet TAKE 1 TABLET BY MOUTH TWICE A DAY 10/02/22   Wendall Stade, MD  hydrochlorothiazide (HYDRODIURIL) 25 MG tablet Take 25 mg by mouth daily as needed (swelling). 10/09/22   [provider]  ipratropium (ATROVENT) 0.06 % nasal spray Place 1-2 sprays into both nostrils 4 (four) times daily. As needed for nasal congestion 10/09/22   Shade Flood, MD  losartan (COZAAR) 100 MG tablet Take 1 tablet (100 mg total) by mouth daily. 10/09/22   Shade Flood, MD  pantoprazole (PROTONIX) 40 MG tablet Take 1 tablet (40 mg total) by mouth daily. 11/07/22   Bobette Mo, MD  pravastatin (PRAVACHOL) 20 MG tablet Take 20 mg by mouth daily.    [provider]  verapamil (CALAN-SR) 120 MG CR tablet Take  1 tablet (120 mg total) by mouth at bedtime. 07/03/22   Wendall Stade, MD   Social History   Socioeconomic History   Marital status: Married    Spouse name: Not on file   Number of children: 6   Years of education: 15   Highest education level: Not on file  Occupational History   Occupation: Location manager    Employer: [employer]  Tobacco Use   Smoking status: Former    Types: Cigarettes   Smokeless tobacco: Never  Substance and Sexual Activity   Alcohol use: No    Alcohol/week: 0.0 standard drinks of alcohol   Drug use: No   Sexual activity: Not on file  Other Topics Concern   Not on file   Social History Narrative   ** Merged History Encounter **       Social Determinants of Health   Financial Resource Strain: Not on file  Food Insecurity: No Food Insecurity (11/06/2022)   Hunger Vital Sign    Worried About Running Out of Food in the Last Year: Never true    Ran Out of Food in the Last Year: Never true  Transportation Needs: No Transportation Needs (11/06/2022)   PRAPARE - Administrator, Civil Service (Medical): No    Lack of Transportation (Non-Medical): No  Physical Activity: Not on file  Stress: Not on file  Social Connections: Unknown (10/18/2021)   Received from [facility]   Social Network    Social Network: Not on file  Intimate Partner Violence: Not At Risk (11/06/2022)   Humiliation, Afraid, Rape, and Kick questionnaire    Fear of Current or Ex-Partner: No    Emotionally Abused: No    Physically Abused: No    Sexually Abused: No    Review of Systems  Per HPI Objective:   Vitals:   12/21/22 1011  BP: 128/72  Pulse: 66  Temp: 98.1 F (36.7 C)  TempSrc: Temporal  SpO2: 100%  Weight: 245 lb 9.6 oz (111.4 kg)  Height: 6\' 2"  (1.88 m)     Physical Exam Vitals reviewed.  Constitutional:      General: He is not in acute distress.    Appearance: Normal appearance. He is well-developed.  HENT:     Head: Normocephalic and atraumatic.  Cardiovascular:     Rate and Rhythm: Normal rate.  Pulmonary:     Effort: Pulmonary effort is normal.  Musculoskeletal:     Comments: C-spine, nontender, pain-free range of motion and does not reproduce shoulder symptoms  Clavicle nontender, slight discomfort over the right AC with slight increased discomfort on crossover testing.  Right shoulder, full range of motion.  No focal bony tenderness.  Discomfort with empty can, liftoff, otherwise pain-free rotator cuff testing and full strength/equal bilaterally.  Positive Neer, Hawkins.  Negative drop arm.  Neurovascular intact distally.  Neurological:      Mental Status: He is alert and oriented to person, place, and time.  Psychiatric:        Mood and Affect: Mood normal.        Assessment & Plan:  Donald [name] is a 52 y.o. male . Acute pain of right shoulder - Plan: Diclofenac Sodium 1 % CREA, Ambulatory referral to Orthopedic Surgery, DG Shoulder Right  3-week history without known injury.  Intact range of motion, less likely adhesive capsulitis, or early symptoms.  He is diabetic.  Pain in various areas including ACE, possible component of rotator cuff tendinosis, bursitis.  Check imaging.  Trial of Voltaren gel and refer to orthopedics, with RTC precautions given. Meds ordered this encounter  Medications   Diclofenac Sodium 1 % CREA    Sig: Apply 4 g topically 4 (four) times daily as needed.    Dispense:  120 g    Refill:  1   Patient Instructions  Shoulder pain could be coming from various areas including the rotator cuff, of the bursa or fluid-filled sac near the rotator cuff and proximal to the joint between your collarbone and shoulder.  Please have x-ray performed at imaging location below.  I prescribed Voltaren gel that can be used up to 4 times per day.  If that is not covered by insurance that can be purchased over-the-counter.  Tylenol is fine for now.  Gentle range of motion and stretching is fine and avoid any repetitive activities with that shoulder if possible for now.  I will also refer you to orthopedics to decide on injection or other treatments.  Please let me know if there are questions and hang in there.  Return to the clinic or go to the nearest emergency room if any of your symptoms worsen or new symptoms occur.  [facility] or xray: Walk in 8:30-4:30 during weekdays, no appointment needed [address]  [place], [place] [address]  Shoulder Pain Many things can cause shoulder pain, including: An injury to the shoulder. Overuse of the shoulder. Arthritis. The source of the pain can  be: Inflammation. An injury to the shoulder joint. An injury to a tendon, ligament, or bone. Follow these instructions at home: Pay attention to changes in your symptoms. Let your health care provider know about them. Follow these instructions to relieve your pain. If you have a removable sling: Wear the sling as told by your provider. Remove it only as told by your provider. Check the skin around the sling every day. Tell your provider about any concerns. Loosen the sling if your fingers tingle, become numb, or become cold. Keep the sling clean. If the sling is not waterproof: Do not let it get wet. Remove it to shower or bathe. Move your arm as little as possible, but keep your hand moving to prevent swelling. Managing pain, stiffness, and swelling  If told, put ice on the painful area. If you have a removable sling or immobilizer, remove it as told by your provider. Put ice in a plastic bag. Place a towel between your skin and the bag. Leave the ice on for 20 minutes, 2-3 times a day. If your skin turns bright red, remove the ice right away to prevent skin damage. The risk of damage is higher if you cannot feel pain, heat, or cold. Move your fingers often to reduce stiffness and swelling. Squeeze a soft ball or a foam pad as much as possible. This helps to keep the shoulder from swelling. It also helps to strengthen the arm. General instructions Take over-the-counter and prescription medicines only as told by your provider. Exercise may help with pain management. Perform exercises if told by your provider. You may be referred to a physical therapist to help in your recovery process. Keep all follow-up visits in order to avoid any type of permanent shoulder disability or chronic pain problems. Contact a health care provider if: Your pain is not relieved with medicines. New pain develops in your arm, hand, or fingers. You loosen your sling and your arm, hand, or fingers remain  tingly, numb, swollen, or painful.  Get help right away if: Your arm, hand, or fingers turn white or blue. This information is not intended to replace advice given to you by your health care provider. Make sure you discuss any questions you have with your health care provider. Document Revised: 12/23/2021 Document Reviewed: 12/23/2021 Elsevier Patient Education  2024 Elsevier Inc.     Signed,   Meredith Staggers, MD [facility], [facility] 12/21/22 11:02 AM

## 2022-12-21 NOTE — Patient Instructions (Signed)
Shoulder pain could be coming from various areas including the rotator cuff, of the bursa or fluid-filled sac near the rotator cuff and proximal to the joint between your collarbone and shoulder.  Please have x-ray performed at imaging location below.  I prescribed Voltaren gel that can be used up to 4 times per day.  If that is not covered by insurance that can be purchased over-the-counter.  Tylenol is fine for now.  Gentle range of motion and stretching is fine and avoid any repetitive activities with that shoulder if possible for now.  I will also refer you to orthopedics to decide on injection or other treatments.  Please let me know if there are questions and hang in there.  Return to the clinic or go to the nearest emergency room if any of your symptoms worsen or new symptoms occur.  [facility] or xray: Walk in 8:30-4:30 during weekdays, no appointment needed [address]  [place], [place] [address]  Shoulder Pain Many things can cause shoulder pain, including: An injury to the shoulder. Overuse of the shoulder. Arthritis. The source of the pain can be: Inflammation. An injury to the shoulder joint. An injury to a tendon, ligament, or bone. Follow these instructions at home: Pay attention to changes in your symptoms. Let your health care provider know about them. Follow these instructions to relieve your pain. If you have a removable sling: Wear the sling as told by your provider. Remove it only as told by your provider. Check the skin around the sling every day. Tell your provider about any concerns. Loosen the sling if your fingers tingle, become numb, or become cold. Keep the sling clean. If the sling is not waterproof: Do not let it get wet. Remove it to shower or bathe. Move your arm as little as possible, but keep your hand moving to prevent swelling. Managing pain, stiffness, and swelling  If told, put ice on the painful area. If you have a removable sling or  immobilizer, remove it as told by your provider. Put ice in a plastic bag. Place a towel between your skin and the bag. Leave the ice on for 20 minutes, 2-3 times a day. If your skin turns bright red, remove the ice right away to prevent skin damage. The risk of damage is higher if you cannot feel pain, heat, or cold. Move your fingers often to reduce stiffness and swelling. Squeeze a soft ball or a foam pad as much as possible. This helps to keep the shoulder from swelling. It also helps to strengthen the arm. General instructions Take over-the-counter and prescription medicines only as told by your provider. Exercise may help with pain management. Perform exercises if told by your provider. You may be referred to a physical therapist to help in your recovery process. Keep all follow-up visits in order to avoid any type of permanent shoulder disability or chronic pain problems. Contact a health care provider if: Your pain is not relieved with medicines. New pain develops in your arm, hand, or fingers. You loosen your sling and your arm, hand, or fingers remain tingly, numb, swollen, or painful. Get help right away if: Your arm, hand, or fingers turn white or blue. This information is not intended to replace advice given to you by your health care provider. Make sure you discuss any questions you have with your health care provider. Document Revised: 12/23/2021 Document Reviewed: 12/23/2021 Elsevier Patient Education  2024 [employer].

## 2022-12-26 ENCOUNTER — Other Ambulatory Visit: Payer: Self-pay | Admitting: Family Medicine

## 2022-12-26 DIAGNOSIS — I10 Essential (primary) hypertension: Secondary | ICD-10-CM

## 2022-12-26 NOTE — Telephone Encounter (Signed)
Pt is asking for a refill on hydrochlorothiazide . Looking thru the med list it is a historical provider that last gave the RX .Would you like to refill ? Pt state he does take this medication

## 2022-12-26 NOTE — Telephone Encounter (Signed)
Med on his discharge summary, will refill.

## 2023-01-08 ENCOUNTER — Ambulatory Visit: Payer: No Typology Code available for payment source | Admitting: Cardiovascular Disease

## 2023-01-11 ENCOUNTER — Ambulatory Visit (HOSPITAL_BASED_OUTPATIENT_CLINIC_OR_DEPARTMENT_OTHER): Payer: No Typology Code available for payment source | Admitting: Orthopaedic Surgery

## 2023-01-12 ENCOUNTER — Ambulatory Visit (HOSPITAL_BASED_OUTPATIENT_CLINIC_OR_DEPARTMENT_OTHER): Payer: No Typology Code available for payment source

## 2023-01-12 ENCOUNTER — Encounter (HOSPITAL_BASED_OUTPATIENT_CLINIC_OR_DEPARTMENT_OTHER): Payer: Self-pay | Admitting: Orthopaedic Surgery

## 2023-01-12 ENCOUNTER — Ambulatory Visit (HOSPITAL_BASED_OUTPATIENT_CLINIC_OR_DEPARTMENT_OTHER): Payer: No Typology Code available for payment source | Admitting: Student

## 2023-01-12 DIAGNOSIS — M25511 Pain in right shoulder: Secondary | ICD-10-CM

## 2023-01-12 DIAGNOSIS — M7542 Impingement syndrome of left shoulder: Secondary | ICD-10-CM | POA: Diagnosis not present

## 2023-01-12 NOTE — Progress Notes (Signed)
Chief Complaint: Right shoulder pain     History of Present Illness:    Donald [name] is a 52 y.o. male presenting today for evaluation of right shoulder pain.  This began about 3 weeks ago with no known injury.  Pain is mostly located over the anterior and lateral aspect of the shoulder radiating down the arm to to the elbow.  Pain levels are moderate and is worsened by certain motions such as reaching overhead.  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  Has gotten some mild relief with Tylenol and heating pad.  Denies any previous injection or physical therapy.  He is right-hand dominant.   Surgical History:   None  PMH/PSH/Family History/Social History/Meds/Allergies:    Past Medical History:  Diagnosis Date   Blood transfusion without reported diagnosis    Chronic bilateral low back pain without sciatica 08/19/2020   DEPRESSION, MILD    Dysuria    G E R D    Hypercholesterolemia    Hyperlipidemia    NOT TAKING MEDICATION   Hypertension    Lumbago    NEPHROLITHIASIS, HX OF    PARASOMNIA    Paroxysmal atrial fibrillation (HCC) 07/10/2022   Prediabetes 08/19/2020   Right bundle branch block    Past Surgical History:  Procedure Laterality Date   CARDIAC CATHETERIZATION N/A 01/19/2016   Procedure: Left Heart Cath and Coronary Angiography;  Surgeon: Tonny Bollman, MD;  Location: [facility] INVASIVE CV LAB;  Service: Cardiovascular;  Laterality: N/A;   HAND SURGERY     right and left hand     Social History   Socioeconomic History   Marital status: Married    Spouse name: Not on file   Number of children: 6   Years of education: 15   Highest education level: Not on file  Occupational History   Occupation: Location manager    Employer: [employer]  Tobacco Use   Smoking status: Former    Types: Cigarettes   Smokeless tobacco:  Never  Substance and Sexual Activity   Alcohol use: No    Alcohol/week: 0.0 standard drinks of alcohol   Drug use: No   Sexual activity: Not on file  Other Topics Concern   Not on file  Social History Narrative   ** Merged History Encounter **       Social Determinants of Health   Financial Resource Strain: Not on file  Food Insecurity: No Food Insecurity (11/06/2022)   Hunger Vital Sign    Worried About Running Out of Food in the Last Year: Never true    Ran Out of Food in the Last Year: Never true  Transportation Needs: No Transportation Needs (11/06/2022)   PRAPARE - Administrator, Civil Service (Medical): No    Lack of Transportation (Non-Medical): No  Physical Activity: Not on file  Stress: Not on file  Social Connections: Unknown (10/18/2021)   Received from [facility]   Social Network    Social Network: Not on file   Family History  Problem Relation Age of Onset   Hypertension Mother    Hypertension Sister    Allergies  Allergen Reactions   Ace Inhibitors Cough    Lisinopril caused  cough   Pork-Derived Products    Current Outpatient Medications  Medication Sig Dispense Refill   Cholecalciferol (VITAMIN D3) 50 MCG (2000 UT) TABS Take one tablet by mouth daily. 90 tablet 1   Diclofenac Sodium 1 % CREA Apply 4 g topically 4 (four) times daily as needed. 120 g 1   ELIQUIS 5 MG TABS tablet TAKE 1 TABLET BY MOUTH TWICE A DAY 180 tablet 1   hydrochlorothiazide (HYDRODIURIL) 25 MG tablet TAKE 1 TABLET (25 MG TOTAL) BY MOUTH DAILY. 90 tablet 1   ipratropium (ATROVENT) 0.06 % nasal spray Place 1-2 sprays into both nostrils 4 (four) times daily. As needed for nasal congestion 15 mL 5   losartan (COZAAR) 100 MG tablet Take 1 tablet (100 mg total) by mouth daily. 90 tablet 1   pantoprazole (PROTONIX) 40 MG tablet Take 1 tablet (40 mg total) by mouth daily. 30 tablet 0   pravastatin (PRAVACHOL) 20 MG tablet Take 20 mg by mouth daily.     verapamil (CALAN-SR) 120  MG CR tablet Take 1 tablet (120 mg total) by mouth at bedtime. 90 tablet 3   No current facility-administered medications for this visit.   No results found.  Review of Systems:   A ROS was performed including pertinent positives and negatives as documented in the HPI.  Physical Exam :   Constitutional: NAD and appears stated age Neurological: Alert and oriented Psych: Appropriate affect and cooperative There were no vitals taken for this visit.   Comprehensive Musculoskeletal Exam:    Tenderness palpation over the anterior glenohumeral joint and lateral deltoid on the right shoulder.  Active range of motion to 170 degrees forward flexion, 150 degrees abduction, 45 degrees external rotation, and internal rotation to L4 bilaterally.  Positive Neer, Hawkins, and painful arc test.  Negative empty can and belly press test.  Imaging:   Xray (right shoulder 3 views): Negative    I personally reviewed and interpreted the radiographs.   Assessment:   52 y.o. male with 3-week history of atraumatic right shoulder pain.  Based on his presentation and special testing today, I do believe this is likely a result of subacromial impingement.  X-rays are negative for any acute bony abnormalities or acromiohumeral narrowing.  After discussion of treatment options, patient is agreeable to a subacromial cortisone injection of the right shoulder as exercises for rotator cuff strengthening.  Injection was performed today in clinic without any complication.  I did give him resources for an at-home rotator cuff program.  Will plan to see him back in about 6 weeks for reevaluation and to assess relief at that time.  Plan :    -Subacromial cortisone injection performed of the right shoulder today -Return to clinic in 6 weeks for reevaluation     I personally saw and evaluated the patient, and participated in the management and treatment plan.  Hazle Nordmann, PA-C Orthopedics  This document was  dictated using Conservation officer, historic buildings. A reasonable attempt at proof reading has been made to minimize errors.

## 2023-01-23 ENCOUNTER — Encounter: Payer: Self-pay | Admitting: Cardiovascular Disease

## 2023-01-23 ENCOUNTER — Telehealth: Payer: Self-pay | Admitting: Cardiovascular Disease

## 2023-01-23 NOTE — Telephone Encounter (Signed)
Patient c/o Palpitations:  STAT if patient reporting lightheadedness, shortness of breath, or chest pain  How long have you had palpitations/irregular HR/ Afib? Are you having the symptoms now? About two weeks   Are you currently experiencing lightheadedness, SOB or CP? No   Do you have a history of afib (atrial fibrillation) or irregular heart rhythm? No   Have you checked your BP or HR? (document readings if available): Yes, I checked my blood pressure   Are you experiencing any other symptoms? No  Answered received via mychart patient schedule.

## 2023-01-23 NOTE — Telephone Encounter (Signed)
Error

## 2023-01-23 NOTE — Telephone Encounter (Signed)
Called patient back about message. Patient stated he has been having palpitation and chest pain that comes and goes for the last couple of weeks. Patient stated his BP can get as high as 170/86 and then as low as 125/78. Patient stated his HR is usually in the 90's. Patient stated he would like to see someone and see what might be going on. Made patient first available appointment with NP to get EKG. Encouraged patient to go to ED if chest pain or palpitations get worse are do not stop with rest. Patient agreed to plan. Will forward to Dr. Eden Emms for further advisement.

## 2023-01-26 NOTE — Progress Notes (Unsigned)
Office Visit    Patient Name: Donald [name] Date of Encounter: 01/26/2023  Primary Care Provider:  Shade Flood, MD Primary Cardiologist:  Charlton Haws, MD Primary Electrophysiologist: None   Past Medical History    Past Medical History:  Diagnosis Date   Blood transfusion without reported diagnosis    Chronic bilateral low back pain without sciatica 08/19/2020   DEPRESSION, MILD    Dysuria    G E R D    Hypercholesterolemia    Hyperlipidemia    NOT TAKING MEDICATION   Hypertension    Lumbago    NEPHROLITHIASIS, HX OF    PARASOMNIA    Paroxysmal atrial fibrillation (HCC) 07/10/2022   Prediabetes 08/19/2020   Right bundle branch block    Past Surgical History:  Procedure Laterality Date   CARDIAC CATHETERIZATION N/A 01/19/2016   Procedure: Left Heart Cath and Coronary Angiography;  Surgeon: Tonny Bollman, MD;  Location: [facility] INVASIVE CV LAB;  Service: Cardiovascular;  Laterality: N/A;   HAND SURGERY     right and left hand      Allergies  Allergies  Allergen Reactions   Ace Inhibitors Cough    Lisinopril caused cough   Pork-Derived Products      History of Present Illness    Donald [name]  is a 52 year old male with a PMH of nonobstructive CAD, paroxysmal AF, RBBB, SVT, HTN, HLD, left kidney hemorrhage who presents today for complaint of chest pain and palpitations.  Donald [name] was seen initially 09/2013 after complaints of chest pain.  He reported chest pain that was off and on and left-sided for several weeks.  An exercise stress test that showed that was normal with 11.7 METS achieved.  He was seen in the ED 01/2016 with complaint of chest pain following recent upper respiratory illness.  EKG was completed showing anterior septal ST elevations along with RBBB.  He underwent emergent LHC that showed nonobstructive CAD with 40% ramus lesion function.  He was found to be hypokalemic and provided supplement for potassium.  Nuclear stress test 07/2019  that was normal no evidence of ST segment deviation.  He was seen in the ED 03/2021 with complaint of chest pain that awaken him from sleep.  Cardiac CTA was ordered but not completed and patient presented back to the ED on 05/2022 due to chest pain and was found to be in AF with RVR.  He was started on Cardizem drip and metoprolol and converted to sinus rhythm.  He was discharged with Eliquis 5 mg twice daily and chest pain felt to be due to demand ischemia from AF.  He was referred to the AF clinic and wore a 2-week ZIO monitor that showed no AF isolated episodes of SVT.  He was seen in the ED 12/03/2022 with complaint of burning chest pain.  He reported pain occurring while lying flat radiating to his left shoulder.  Patient's ACS workup was negative and pain was consistent with possible GERD.  He was seen on 11/20/2022 by Tereso Newcomer, PA and reported doing well with no recurrence of chest pain.  He contacted our office on 01/23/2023 with complaint of chest pain and palpitations.  Since last being seen in the office patient reports***.  Patient denies chest pain, palpitations, dyspnea, PND, orthopnea, nausea, vomiting, dizziness, syncope, edema, weight gain, or early satiety.     ***Notes:  Home Medications    Current Outpatient Medications  Medication Sig Dispense Refill   Cholecalciferol (VITAMIN D3)  50 MCG (2000 UT) TABS Take one tablet by mouth daily. 90 tablet 1   Diclofenac Sodium 1 % CREA Apply 4 g topically 4 (four) times daily as needed. 120 g 1   ELIQUIS 5 MG TABS tablet TAKE 1 TABLET BY MOUTH TWICE A DAY 180 tablet 1   hydrochlorothiazide (HYDRODIURIL) 25 MG tablet TAKE 1 TABLET (25 MG TOTAL) BY MOUTH DAILY. 90 tablet 1   ipratropium (ATROVENT) 0.06 % nasal spray Place 1-2 sprays into both nostrils 4 (four) times daily. As needed for nasal congestion 15 mL 5   losartan (COZAAR) 100 MG tablet Take 1 tablet (100 mg total) by mouth daily. 90 tablet 1   pantoprazole (PROTONIX) 40 MG tablet  Take 1 tablet (40 mg total) by mouth daily. 30 tablet 0   pravastatin (PRAVACHOL) 20 MG tablet Take 20 mg by mouth daily.     verapamil (CALAN-SR) 120 MG CR tablet Take 1 tablet (120 mg total) by mouth at bedtime. 90 tablet 3   No current facility-administered medications for this visit.     Review of Systems  Please see the history of present illness.    (+)*** (+)***  All other systems reviewed and are otherwise negative except as noted above.  Physical Exam    Wt Readings from Last 3 Encounters:  12/21/22 245 lb 9.6 oz (111.4 kg)  11/20/22 244 lb 3.2 oz (110.8 kg)  11/13/22 244 lb 3.2 oz (110.8 kg)   QI:HKVQQ were no vitals filed for this visit.,There is no height or weight on file to calculate BMI.  Constitutional:      Appearance: Healthy appearance. Not in distress.  Neck:     Vascular: JVD normal.  Pulmonary:     Effort: Pulmonary effort is normal.     Breath sounds: No wheezing. No rales. Diminished in the bases Cardiovascular:     Normal rate. Regular rhythm. Normal S1. Normal S2.      Murmurs: There is no murmur.  Edema:    Peripheral edema absent.  Abdominal:     Palpations: Abdomen is soft non tender. There is no hepatomegaly.  Skin:    General: Skin is warm and dry.  Neurological:     General: No focal deficit present.     Mental Status: Alert and oriented to person, place and time.     Cranial Nerves: Cranial nerves are intact.  EKG/LABS/ Recent Cardiac Studies    ECG personally reviewed by me today - ***   Risk Assessment/Calculations:   {Does this patient have ATRIAL FIBRILLATION?:[phone]}        Lab Results  Component Value Date   WBC 4.9 11/13/2022   HGB 14.1 11/13/2022   HCT 43.3 11/13/2022   MCV 85.5 11/13/2022   PLT 322.0 11/13/2022   Lab Results  Component Value Date   CREATININE 0.92 11/13/2022   BUN 14 11/13/2022   NA 137 11/13/2022   K 4.0 11/13/2022   CL 100 11/13/2022   CO2 28 11/13/2022   Lab Results  Component  Value Date   ALT 34 10/09/2022   AST 31 10/09/2022   ALKPHOS 52 10/09/2022   BILITOT 0.5 10/09/2022   Lab Results  Component Value Date   CHOL 197 10/09/2022   HDL 48.20 10/09/2022   LDLCALC 137 (H) 10/09/2022   TRIG 60.0 10/09/2022   CHOLHDL 4 10/09/2022    Lab Results  Component Value Date   HGBA1C 6.0 10/09/2022     Assessment & Plan  1.  Chest pain:  2.  Paroxysmal AF:  3.  Nonobstructive CAD:  4.  Essential hypertension:       Disposition: Follow-up with Charlton Haws, MD or APP in *** months {Are you ordering a CV Procedure (e.g. stress test, cath, DCCV, TEE, etc)?   Press F2        :[id number]}   Medication Adjustments/Labs and Tests Ordered: Current medicines are reviewed at length with the patient today.  Concerns regarding medicines are outlined above.   Signed, Napoleon Form, Leodis Rains, NP 01/26/2023, 11:55 AM [facility]

## 2023-01-29 ENCOUNTER — Encounter: Payer: Self-pay | Admitting: Nurse Practitioner

## 2023-01-29 ENCOUNTER — Ambulatory Visit: Payer: No Typology Code available for payment source | Attending: Nurse Practitioner | Admitting: Nurse Practitioner

## 2023-01-29 ENCOUNTER — Other Ambulatory Visit: Payer: Self-pay | Admitting: Nurse Practitioner

## 2023-01-29 VITALS — BP 142/82 | HR 90 | Ht 74.0 in | Wt 238.6 lb

## 2023-01-29 DIAGNOSIS — I2510 Atherosclerotic heart disease of native coronary artery without angina pectoris: Secondary | ICD-10-CM | POA: Diagnosis not present

## 2023-01-29 DIAGNOSIS — I480 Paroxysmal atrial fibrillation: Secondary | ICD-10-CM

## 2023-01-29 DIAGNOSIS — I10 Essential (primary) hypertension: Secondary | ICD-10-CM

## 2023-01-29 DIAGNOSIS — R079 Chest pain, unspecified: Secondary | ICD-10-CM

## 2023-01-29 MED ORDER — PANTOPRAZOLE SODIUM 40 MG PO TBEC: 40.0000 mg | DELAYED_RELEASE_TABLET | Freq: Every day | ORAL | 3 refills | Status: DC

## 2023-01-29 MED ORDER — ISOSORBIDE MONONITRATE ER 30 MG PO TB24: 30.0000 mg | ORAL_TABLET | Freq: Every day | ORAL | 1 refills | Status: DC

## 2023-01-29 NOTE — Patient Instructions (Addendum)
Medication Instructions:  START Imdur 30mg  Take 1 tablet once a day  *If you need a refill on your cardiac medications before your next appointment, please call your pharmacy*   Lab Work: None ordered   Testing/Procedures: Cardiac PET Stress Test   Follow-Up: At [facility], you and your health needs are our priority.  As part of our continuing mission to provide you with exceptional heart care, we have created designated Provider Care Teams.  These Care Teams include your primary Cardiologist (physician) and Advanced Practice Providers (APPs -  Physician Assistants and Nurse Practitioners) who all work together to provide you with the care you need, when you need it.  We recommend signing up for the patient portal called "MyChart".  Sign up information is provided on this After Visit Summary.  MyChart is used to connect with patients for Virtual Visits (Telemedicine).  Patients are able to view lab/test results, encounter notes, upcoming appointments, etc.  Non-urgent messages can be sent to your provider as well.   To learn more about what you can do with MyChart, go to [email or web address].    Your next appointment:   2 month(s)  Provider:   Robin Searing, NP       Other Instructions:  Check your blood pressure daily for 2 weeks, then contact the office with your readings.  Make sure to check 2 hours after your medications.   AVOID these things for 30 minutes before checking your blood pressure: No Drinking caffeine. No Drinking alcohol. No Eating. No Smoking. No Exercising.  Five minutes before checking your blood pressure: Pee. Sit in a dining chair. Avoid sitting in a soft couch or armchair. Be quiet. Do not talk.    How to Prepare for Your Cardiac PET/CT Stress Test:   1. Please do not take these medications before your test:   Medications that may interfere with the cardiac pharmacological stress agent (ex. nitrates - including erectile dysfunction  medications, isosorbide mononitrate, tamulosin or beta-blockers) the day of the exam. (Erectile dysfunction medication should be held for at least 72 hrs prior to test) Theophylline containing medications for 12 hours. Dipyridamole 48 hours prior to the test. Your remaining medications may be taken with water.  2. Nothing to eat or drink, except water, 3 hours prior to arrival time.   NO caffeine/decaffeinated products, or chocolate 12 hours prior to arrival.  3. NO perfume, cologne or lotion  4. Total time is 1 to 2 hours; you may want to bring reading material for the waiting time.  5. Please report to Radiology at the [facility] Main Entrance 30 minutes early for your test.  [address] [place], [place] [address]   In preparation for your appointment, medication and supplies will be purchased. Appointment availability is limited, so if you need to cancel or reschedule, please call the Radiology Department at [phone] [facility]) [place] [phone] [facility])  24 hours in advance to avoid a cancellation fee of $100.00  What to Expect After you Arrive:  Once you arrive and check in for your appointment, you will be taken to a preparation room within the Radiology Department.  A technologist or Nurse will obtain your medical history, verify that you are correctly prepped for the exam, and explain the procedure.  Afterwards,  an IV will be started in your arm and electrodes will be placed on your skin for EKG monitoring during the stress portion of the exam. Then you will be escorted to the  PET/CT scanner.  There, staff will get you positioned on the scanner and obtain a blood pressure and EKG.  During the exam, you will continue to be connected to the EKG and blood pressure machines.  A small, safe amount of a radioactive tracer will be injected in your IV to obtain a series of pictures of your heart along with an injection of a stress agent.    After your Exam:  It is  recommended that you eat a meal and drink a caffeinated beverage to counter act any effects of the stress agent.  Drink plenty of fluids for the remainder of the day and urinate frequently for the first couple of hours after the exam.  Your doctor will inform you of your test results within 7-10 business days.  For more information and frequently asked questions, please visit our website : [email or web address]  For questions about your test or how to prepare for your test, please call: Cardiac Imaging Nurse Navigators Office: [phone]

## 2023-01-31 NOTE — Telephone Encounter (Signed)
Pt's pharmacy is requesting a refill on pantoprazole. Would Robin Searing, NP like to refill this medication? Please address

## 2023-02-19 ENCOUNTER — Ambulatory Visit (HOSPITAL_BASED_OUTPATIENT_CLINIC_OR_DEPARTMENT_OTHER): Payer: No Typology Code available for payment source | Admitting: Student

## 2023-02-20 ENCOUNTER — Other Ambulatory Visit: Payer: Self-pay | Admitting: Nurse Practitioner

## 2023-03-23 ENCOUNTER — Encounter (HOSPITAL_COMMUNITY): Payer: Self-pay

## 2023-03-25 NOTE — Progress Notes (Unsigned)
Cardiology Office Note    Patient Name: Donald [name] Date of Encounter: 03/25/2023  Primary Care Provider:  Shade Flood, MD Primary Cardiologist:  Charlton Haws, MD Primary Electrophysiologist: None   Past Medical History    Past Medical History:  Diagnosis Date   Blood transfusion without reported diagnosis    Chronic bilateral low back pain without sciatica 08/19/2020   DEPRESSION, MILD    Dysuria    G E R D    Hypercholesterolemia    Hyperlipidemia    NOT TAKING MEDICATION   Hypertension    Lumbago    NEPHROLITHIASIS, HX OF    PARASOMNIA    Paroxysmal atrial fibrillation (HCC) 07/10/2022   Prediabetes 08/19/2020   Right bundle branch block     History of Present Illness  Donald [name]  is a 52 year old male with a PMH of nonobstructive CAD, paroxysmal AF, RBBB, SVT, HTN, HLD, left kidney hemorrhage who presents today for 65-month follow-up.  Mr. Donald [name] was last seen on 01/2023 with complaint of chest pain that he described as intermittent and centrally located that was reproduces with palpation.  He was noted to have elevated BP at 158/90 and was 142/82 on recheck.  During visit patient's Imdur was increased to 30 mg and cardiac PET stress test was recommended to rule out microvascular causes.  EKG was completed and patient was in sinus rhythm with plans to continue current medications as prescribed.  He is scheduled to undergo PET stress test on 03/27/2023.  During today's visit the patient reports*** .  Patient denies chest pain, palpitations, dyspnea, PND, orthopnea, nausea, vomiting, dizziness, syncope, edema, weight gain, or early satiety.  ***Notes: -Last ischemic evaluation: -Last echo: -Interim ED visits: Review of Systems  Please see the history of present illness.    All other systems reviewed and are otherwise negative except as noted above.  Physical Exam    Wt Readings from Last 3 Encounters:  01/29/23 238 lb 9.6 oz (108.2 kg)  12/21/22  245 lb 9.6 oz (111.4 kg)  11/20/22 244 lb 3.2 oz (110.8 kg)   ON:GEXBM were no vitals filed for this visit.,There is no height or weight on file to calculate BMI. GEN: Well nourished, well developed in no acute distress Neck: No JVD; No carotid bruits Pulmonary: Clear to auscultation without rales, wheezing or rhonchi  Cardiovascular: Normal rate. Regular rhythm. Normal S1. Normal S2.   Murmurs: There is no murmur.  ABDOMEN: Soft, non-tender, non-distended EXTREMITIES:  No edema; No deformity   EKG/LABS/ Recent Cardiac Studies   ECG personally reviewed by me today - ***  Risk Assessment/Calculations:   {Does this patient have ATRIAL FIBRILLATION?:([phone]}      Lab Results  Component Value Date   WBC 4.9 11/13/2022   HGB 14.1 11/13/2022   HCT 43.3 11/13/2022   MCV 85.5 11/13/2022   PLT 322.0 11/13/2022   Lab Results  Component Value Date   CREATININE 0.92 11/13/2022   BUN 14 11/13/2022   NA 137 11/13/2022   K 4.0 11/13/2022   CL 100 11/13/2022   CO2 28 11/13/2022   Lab Results  Component Value Date   CHOL 197 10/09/2022   HDL 48.20 10/09/2022   LDLCALC 137 (H) 10/09/2022   TRIG 60.0 10/09/2022   CHOLHDL 4 10/09/2022    Lab Results  Component Value Date   HGBA1C 6.0 10/09/2022   Assessment & Plan    1.  Chest pain: -Today patient reports***   2.  Paroxysmal  AF: -EKG completed today and patient is in sinus rhythm -Patient's creatinine was 0.9 and hemoglobin is 14.1 -Continue Eliquis 5 mg twice daily -Continue rate control with verapamil 120 mg -CHA2DS2-VASc Score = 2 [CHF History: 0, HTN History: 1, Diabetes History: 0, Stroke History: 0, Vascular Disease History: 1, Age Score: 0, Gender Score: 0].  Therefore, the patient's annual risk of stroke is 2.2 %.       3.  Nonobstructive CAD: -LHC complete completed 01/2016 mild nonobstructive CAD and Myoview completed 2021 which was low risk -Patient reports***  4.  Essential hypertension: -Patient blood  pressure today was***      Disposition: Follow-up with Charlton Haws, MD or APP in *** months {Are you ordering a CV Procedure (e.g. stress test, cath, DCCV, TEE, etc)?   Press F2        :[id number]}   Signed, Napoleon Form, Leodis Rains, NP 03/25/2023, 12:34 PM [facility]

## 2023-03-26 ENCOUNTER — Ambulatory Visit: Payer: No Typology Code available for payment source | Attending: Nurse Practitioner | Admitting: Nurse Practitioner

## 2023-03-26 ENCOUNTER — Encounter: Payer: Self-pay | Admitting: Nurse Practitioner

## 2023-03-26 VITALS — BP 136/84 | HR 64 | Ht 74.0 in | Wt 243.0 lb

## 2023-03-26 DIAGNOSIS — R079 Chest pain, unspecified: Secondary | ICD-10-CM | POA: Diagnosis not present

## 2023-03-26 DIAGNOSIS — I480 Paroxysmal atrial fibrillation: Secondary | ICD-10-CM

## 2023-03-26 DIAGNOSIS — I2510 Atherosclerotic heart disease of native coronary artery without angina pectoris: Secondary | ICD-10-CM

## 2023-03-26 DIAGNOSIS — I10 Essential (primary) hypertension: Secondary | ICD-10-CM | POA: Diagnosis not present

## 2023-03-26 DIAGNOSIS — R400 Somnolence: Secondary | ICD-10-CM

## 2023-03-26 DIAGNOSIS — R0683 Snoring: Secondary | ICD-10-CM

## 2023-03-26 MED ORDER — ISOSORBIDE MONONITRATE ER 30 MG PO TB24: 15.0000 mg | ORAL_TABLET | Freq: Every day | ORAL | 0 refills | Status: AC

## 2023-03-26 MED ORDER — VERAPAMIL HCL 40 MG PO TABS: 40.0000 mg | ORAL_TABLET | Freq: Every day | ORAL | 0 refills | Status: DC | PRN

## 2023-03-26 NOTE — Patient Instructions (Addendum)
Medication Instructions:  DECREASE Imdur to 15mg  (break 30mg  tablet in half and take half tablet daily) you can take an additional half tab as needed for chest pain START Verapamil 40mg  You can take as needed for breakthrough chest pain *If you need a refill on your cardiac medications before your next appointment, please call your pharmacy*   Lab Work: None ordered   Testing/Procedures: Your physician has recommended that you have a sleep study. This test records several body functions during sleep, including: brain activity, eye movement, oxygen and carbon dioxide blood levels, heart rate and rhythm, breathing rate and rhythm, the flow of air through your mouth and nose, snoring, body muscle movements, and chest and belly movement.   Follow-Up: At [facility], you and your health needs are our priority.  As part of our continuing mission to provide you with exceptional heart care, we have created designated Provider Care Teams.  These Care Teams include your primary Cardiologist (physician) and Advanced Practice Providers (APPs -  Physician Assistants and Nurse Practitioners) who all work together to provide you with the care you need, when you need it.  We recommend signing up for the patient portal called "MyChart".  Sign up information is provided on this After Visit Summary.  MyChart is used to connect with patients for Virtual Visits (Telemedicine).  Patients are able to view lab/test results, encounter notes, upcoming appointments, etc.  Non-urgent messages can be sent to your provider as well.   To learn more about what you can do with MyChart, go to [email or web address].    Your next appointment:   Follow up as scheduled   Provider:   Charlton Haws, MD     Other Instructions

## 2023-03-27 ENCOUNTER — Encounter (HOSPITAL_COMMUNITY)
Admission: RE | Admit: 2023-03-27 | Discharge: 2023-03-27 | Disposition: A | Discharge status: Home / Self Care | Payer: No Typology Code available for payment source | Source: Ambulatory Visit | Attending: Nurse Practitioner | Admitting: Nurse Practitioner

## 2023-03-27 DIAGNOSIS — I2510 Atherosclerotic heart disease of native coronary artery without angina pectoris: Secondary | ICD-10-CM | POA: Insufficient documentation

## 2023-03-27 DIAGNOSIS — I10 Essential (primary) hypertension: Secondary | ICD-10-CM | POA: Diagnosis not present

## 2023-03-27 DIAGNOSIS — R079 Chest pain, unspecified: Secondary | ICD-10-CM | POA: Diagnosis not present

## 2023-03-27 DIAGNOSIS — I480 Paroxysmal atrial fibrillation: Secondary | ICD-10-CM | POA: Diagnosis not present

## 2023-03-27 LAB — NM PET CT CARDIAC PERFUSION MULTI W/ABSOLUTE BLOODFLOW
LV dias vol: 156 mL (ref 62–150)
LV sys vol: 52 mL
MBFR: 2.76
Nuc Rest EF: 67 %
Nuc Stress EF: 70 %
Rest MBF: 1.27 ml/g/min
Rest Nuclear Isotope Dose: 28.5 mCi
ST Depression (mm): 1 mm
Stress MBF: 3.5 ml/g/min
Stress Nuclear Isotope Dose: 28.5 mCi

## 2023-03-27 MED ORDER — RUBIDIUM RB82 GENERATOR (RUBYFILL)
28.5000 | PACK | Freq: Once | INTRAVENOUS | Status: AC
  Administered 2023-03-27: 28.51 via INTRAVENOUS

## 2023-03-27 MED ORDER — REGADENOSON 0.4 MG/5ML IV SOLN
0.4000 mg | Freq: Once | INTRAVENOUS | Status: AC
  Administered 2023-03-27: 0.4 mg via INTRAVENOUS

## 2023-03-27 MED ORDER — RUBIDIUM RB82 GENERATOR (RUBYFILL)
28.5300 | PACK | Freq: Once | INTRAVENOUS | Status: AC
  Administered 2023-03-27: 28.53 via INTRAVENOUS

## 2023-03-27 MED ORDER — REGADENOSON 0.4 MG/5ML IV SOLN
INTRAVENOUS | Status: AC
  Filled 2023-03-27: qty 5

## 2023-03-27 NOTE — Progress Notes (Signed)
Pt had CP of 4/10 with some EKG changes notred.  Called Dr. Royann Shivers.  Pt chest pain resolved.  Per MD instructed pt to take it wasy the rest of the day and they would have his test read by the afternoon..  Caffeine given to pt.

## 2023-04-08 ENCOUNTER — Emergency Department (HOSPITAL_COMMUNITY): Payer: No Typology Code available for payment source

## 2023-04-08 ENCOUNTER — Emergency Department (HOSPITAL_COMMUNITY)
Admission: EM | Admit: 2023-04-08 | Discharge: 2023-04-08 | Disposition: A | Discharge status: Home / Self Care | Payer: No Typology Code available for payment source | Attending: Emergency Medicine | Admitting: Emergency Medicine

## 2023-04-08 ENCOUNTER — Other Ambulatory Visit: Payer: Self-pay

## 2023-04-08 ENCOUNTER — Encounter (HOSPITAL_COMMUNITY): Payer: Self-pay | Admitting: Emergency Medicine

## 2023-04-08 DIAGNOSIS — R202 Paresthesia of skin: Secondary | ICD-10-CM | POA: Diagnosis not present

## 2023-04-08 DIAGNOSIS — I10 Essential (primary) hypertension: Secondary | ICD-10-CM | POA: Insufficient documentation

## 2023-04-08 DIAGNOSIS — Z7901 Long term (current) use of anticoagulants: Secondary | ICD-10-CM | POA: Diagnosis not present

## 2023-04-08 DIAGNOSIS — E876 Hypokalemia: Secondary | ICD-10-CM

## 2023-04-08 DIAGNOSIS — Z79899 Other long term (current) drug therapy: Secondary | ICD-10-CM | POA: Insufficient documentation

## 2023-04-08 DIAGNOSIS — R42 Dizziness and giddiness: Secondary | ICD-10-CM | POA: Diagnosis present

## 2023-04-08 LAB — COMPREHENSIVE METABOLIC PANEL
ALT: 15 U/L (ref 0–44)
AST: 21 U/L (ref 15–41)
Albumin: 4.3 g/dL (ref 3.5–5.0)
Alkaline Phosphatase: 43 U/L (ref 38–126)
Anion gap: 9 (ref 5–15)
BUN: 22 mg/dL — ABNORMAL HIGH (ref 6–20)
CO2: 25 mmol/L (ref 22–32)
Calcium: 9 mg/dL (ref 8.9–10.3)
Chloride: 102 mmol/L (ref 98–111)
Creatinine, Ser: 1.03 mg/dL (ref 0.61–1.24)
GFR, Estimated: >60 mL/min (ref >60)
Glucose, Bld: 129 mg/dL — ABNORMAL HIGH (ref 70–99)
Potassium: 3.2 mmol/L — ABNORMAL LOW (ref 3.5–5.1)
Sodium: 136 mmol/L (ref 135–145)
Total Bilirubin: 0.7 mg/dL (ref 0.3–1.2)
Total Protein: 7.7 g/dL (ref 6.5–8.1)

## 2023-04-08 LAB — CBC
HCT: 41.8 % (ref 39.0–52.0)
Hemoglobin: 14 g/dL (ref 13.0–17.0)
MCH: 29.2 pg (ref 26.0–34.0)
MCHC: 33.5 g/dL (ref 30.0–36.0)
MCV: 87.1 fL (ref 80.0–100.0)
Platelets: 293 10*3/uL (ref 150–400)
RBC: 4.8 MIL/uL (ref 4.22–5.81)
RDW: 14.1 % (ref 11.5–15.5)
WBC: 6 10*3/uL (ref 4.0–10.5)
nRBC: 0 % (ref 0.0–0.2)

## 2023-04-08 LAB — DIFFERENTIAL
Abs Immature Granulocytes: 0.01 10*3/uL (ref 0.00–0.07)
Basophils Absolute: 0.1 10*3/uL (ref 0.0–0.1)
Basophils Relative: 1 %
Eosinophils Absolute: 0.2 10*3/uL (ref 0.0–0.5)
Eosinophils Relative: 3 %
Immature Granulocytes: 0 %
Lymphocytes Relative: 50 %
Lymphs Abs: 3 10*3/uL (ref 0.7–4.0)
Monocytes Absolute: 0.7 10*3/uL (ref 0.1–1.0)
Monocytes Relative: 12 %
Neutro Abs: 2 10*3/uL (ref 1.7–7.7)
Neutrophils Relative %: 34 %

## 2023-04-08 LAB — I-STAT CHEM 8, ED
BUN: 25 mg/dL — ABNORMAL HIGH (ref 6–20)
Calcium, Ion: 1.2 mmol/L (ref 1.15–1.40)
Chloride: 100 mmol/L (ref 98–111)
Creatinine, Ser: 1.1 mg/dL (ref 0.61–1.24)
Glucose, Bld: 124 mg/dL — ABNORMAL HIGH (ref 70–99)
HCT: 43 % (ref 39.0–52.0)
Hemoglobin: 14.6 g/dL (ref 13.0–17.0)
Potassium: 3.3 mmol/L — ABNORMAL LOW (ref 3.5–5.1)
Sodium: 140 mmol/L (ref 135–145)
TCO2: 26 mmol/L (ref 22–32)

## 2023-04-08 LAB — PROTIME-INR
INR: 1 (ref 0.8–1.2)
Prothrombin Time: 13.5 s (ref 11.4–15.2)

## 2023-04-08 LAB — ETHANOL: Alcohol, Ethyl (B): <10 mg/dL (ref <10)

## 2023-04-08 LAB — APTT: aPTT: 30 s (ref 24–36)

## 2023-04-08 MED ORDER — POTASSIUM CHLORIDE CRYS ER 20 MEQ PO TBCR
20.0000 meq | EXTENDED_RELEASE_TABLET | Freq: Once | ORAL | Status: AC
  Administered 2023-04-08: 20 meq via ORAL
  Filled 2023-04-08: qty 1

## 2023-04-08 NOTE — ED Provider Notes (Signed)
[place] EMERGENCY DEPARTMENT AT [facility] Provider Note   CSN: [id number] Arrival date & time: 04/08/23  0004     History  Chief Complaint  Patient presents with   Dizziness   Numbness    Donald [name] is a 52 y.o. male.  The history is provided by the patient and medical records.  Dizziness Donald [name] is a 52 y.o. male who presents to the Emergency Department complaining of dizziness.  He presents to the emergency department for evaluation of intermittent dizziness that started around 1 PM.  He describes it as an unbalanced and spinning sensation.  He also had some tingling described as a pins and needle sensation to the left side of his face for 5 hours.  Symptoms are completely resolved at time of ED evaluation.    No fever, nausea, vomiting, ear pain, vision changes, chest pain, sob, leg swelling.    PMhx HTN, HPL, paroxysmal atrial fibrillation on eliquis.     Home Medications Prior to Admission medications   Medication Sig Start Date End Date Taking? Authorizing Provider  Cholecalciferol (VITAMIN D3) 50 MCG (2000 UT) TABS Take one tablet by mouth daily. 04/25/21   Shade Flood, MD  Diclofenac Sodium 1 % CREA Apply 4 g topically 4 (four) times daily as needed. 12/21/22   Shade Flood, MD  ELIQUIS 5 MG TABS tablet TAKE 1 TABLET BY MOUTH TWICE A DAY 10/02/22   Wendall Stade, MD  hydrochlorothiazide (HYDRODIURIL) 25 MG tablet TAKE 1 TABLET (25 MG TOTAL) BY MOUTH DAILY. 12/26/22   Shade Flood, MD  ipratropium (ATROVENT) 0.06 % nasal spray Place 1-2 sprays into both nostrils 4 (four) times daily. As needed for nasal congestion 10/09/22   Shade Flood, MD  isosorbide mononitrate (IMDUR) 30 MG 24 hr tablet Take 0.5 tablets (15 mg total) by mouth daily. Can take an additional half tab as needed for chest pain 03/26/23   Gaston Islam., NP  losartan (COZAAR) 100 MG tablet Take 1 tablet (100 mg total) by mouth daily. 10/09/22   Shade Flood, MD  pantoprazole (PROTONIX) 40 MG tablet TAKE 1 TABLET BY MOUTH EVERY DAY 01/31/23   Gaston Islam., NP  pravastatin (PRAVACHOL) 20 MG tablet Take 20 mg by mouth daily.    [provider]  verapamil (CALAN) 40 MG tablet Take 1 tablet (40 mg total) by mouth daily as needed (breakthrough chest pain). 03/26/23   Gaston Islam., NP  verapamil (CALAN-SR) 120 MG CR tablet Take 1 tablet (120 mg total) by mouth at bedtime. 07/03/22   Wendall Stade, MD      Allergies    Ace inhibitors and Pork-derived products    Review of Systems   Review of Systems  Neurological:  Positive for dizziness.  All other systems reviewed and are negative.   Physical Exam Updated Vital Signs BP (!) 137/94 (BP Location: Right Arm)   Pulse 63   Temp 98.3 F (36.8 C) (Oral)   Resp 18   SpO2 99%  Physical Exam Vitals and nursing note reviewed.  Constitutional:      Appearance: He is well-developed.  HENT:     Head: Normocephalic and atraumatic.  Cardiovascular:     Rate and Rhythm: Normal rate and regular rhythm.     Heart sounds: No murmur heard. Pulmonary:     Effort: Pulmonary effort is normal. No respiratory distress.     Breath sounds: Normal breath  sounds.  Abdominal:     Palpations: Abdomen is soft.     Tenderness: There is no abdominal tenderness. There is no guarding or rebound.  Musculoskeletal:        General: No tenderness.  Skin:    General: Skin is warm and dry.  Neurological:     Mental Status: He is alert and oriented to person, place, and time.     Comments: No asymmetry of facial movements.  Visual fields grossly intact.  Sensation to light touch intact in all 4 extremities and throughout the face.  5 out of 5 strength in all 4 extremities.  No pronator drift.  Ambulates with a steady gait.  Psychiatric:        Behavior: Behavior normal.     ED Results / Procedures / Treatments   Labs (all labs ordered are listed, but only abnormal results are  displayed) Labs Reviewed  COMPREHENSIVE METABOLIC PANEL - Abnormal; Notable for the following components:      Result Value   Potassium 3.2 (*)    Glucose, Bld 129 (*)    BUN 22 (*)    All other components within normal limits  I-STAT CHEM 8, ED - Abnormal; Notable for the following components:   Potassium 3.3 (*)    BUN 25 (*)    Glucose, Bld 124 (*)    All other components within normal limits  PROTIME-INR  APTT  CBC  DIFFERENTIAL  ETHANOL  CBG MONITORING, ED    EKG EKG Interpretation Date/Time:  Sunday April 08 2023 00:25:17 EDT Ventricular Rate:  73 PR Interval:  174 QRS Duration:  176 QT Interval:  428 QTC Calculation: 472 R Axis:   79  Text Interpretation: Sinus rhythm Right bundle branch block Baseline wander in lead(s) V4 Confirmed by Tilden Fossa [phone]) on 04/08/2023 12:28:53 AM  Radiology CT HEAD WO CONTRAST  Result Date: 04/08/2023 CLINICAL DATA:  Headache, classic migraine EXAM: CT HEAD WITHOUT CONTRAST TECHNIQUE: Contiguous axial images were obtained from the base of the skull through the vertex without intravenous contrast. RADIATION DOSE REDUCTION: This exam was performed according to the departmental dose-optimization program which includes automated exposure control, adjustment of the mA and/or kV according to patient size and/or use of iterative reconstruction technique. COMPARISON:  None Available. FINDINGS: Brain: No evidence of acute infarction, hemorrhage, hydrocephalus, extra-axial collection or mass lesion/mass effect. Vascular: No hyperdense vessel or unexpected calcification. Skull: Normal. Negative for fracture or focal lesion. Sinuses/Orbits: The visualized paranasal sinuses are essentially clear. The mastoid air cells are unopacified. Other: None. IMPRESSION: Normal head CT. Electronically Signed   By: Charline Bills M.D.   On: 04/08/2023 01:09    Procedures Procedures    Medications Ordered in ED Medications  potassium chloride SA  (KLOR-CON M) CR tablet 20 mEq (20 mEq Oral Given 04/08/23 6045)    ED Course/ Medical Decision Making/ A&P                                 Medical Decision Making Amount and/or Complexity of Data Reviewed Labs: ordered. Radiology: ordered.  Risk Prescription drug management.   Patient with history of hypertension, paroxysmal atrial fibrillation on anticoagulation here for evaluation of dizziness that is intermittent as well as tingling to the left side of his face.  Symptoms are completely resolved at time of ED assessment.  He has no focal neurologic deficits.  CT head is negative for acute  abnormality.  He does have mild hypokalemia.  EKG with sinus rhythm.  Confirm with patient that he had active tingling and not numbness to his face.  Current clinical picture is not consistent with CVA.  He was hypertensive at time of ED presentation, blood pressure did improve without intervention.  Discussed with patient unclear source of symptoms.  Will provide p.o. potassium.  Discussed that he will need to follow-up with his PCP for recheck of his potassium feel he is stable for discharge home with outpatient follow-up.  Presentation is not consistent with CVA/TIA, hypertensive urgency, arrhythmia.        Final Clinical Impression(s) / ED Diagnoses Final diagnoses:  Paresthesia  Dizziness  Hypokalemia    Rx / DC Orders ED Discharge Orders     None         Tilden Fossa, MD 04/08/23 [phone]

## 2023-04-08 NOTE — ED Triage Notes (Signed)
Pt repors dizziness and numbness on left side of face & neck x 3 hours. Denies numbness in extremities.

## 2023-04-08 NOTE — ED Provider Triage Note (Signed)
Emergency Medicine Provider Triage Evaluation Note  Donald [name] , a 52 y.o. male  was evaluated in triage.  Pt complains of lightheadedness with tingling felt on the left side of his scalp which began approximately 3 hours ago.  Patient states the lightheadedness was the first symptom followed by the tingling.  Symptoms have started to subside.  He denies headache, shortness of breath, chest pain, abdominal pain, nausea, vomiting  Review of Systems  Positive:  Negative:   Physical Exam  BP (!) 172/98   Pulse 71   Temp 98.2 F (36.8 C) (Oral)   Resp 19   SpO2 100%  Gen:   Awake, no distress   Resp:  Normal effort  MSK:   Moves extremities without difficulty  Other:    Medical Decision Making  Medically screening exam initiated at 12:43 AM.  Appropriate orders placed.  Read Bonelli was informed that the remainder of the evaluation will be completed by another provider, this initial triage assessment does not replace that evaluation, and the importance of remaining in the ED until their evaluation is complete.     Darrick Grinder, PA-C 04/08/23 [phone]

## 2023-04-10 ENCOUNTER — Telehealth: Payer: Self-pay

## 2023-04-10 NOTE — Transitions of Care (Post Inpatient/ED Visit) (Signed)
   04/10/2023  Name: Donald [name] MRN: [id number] DOB: 1970/06/11  Today's TOC FU Call Status: Today's TOC FU Call Status:: Unsuccessful Call (1st Attempt)  Attempted to reach the patient regarding the most recent Inpatient/ED visit.  Follow Up Plan: Additional outreach attempts will be made to reach the patient to complete the Transitions of Care (Post Inpatient/ED visit) call.   Signature  [employer]

## 2023-04-13 ENCOUNTER — Telehealth: Payer: Self-pay

## 2023-04-13 NOTE — Telephone Encounter (Signed)
**Note De-Identified Zykia [place]a Obfuscation** Pending [employer] review. The prior authorization/notification reference number is: [id number].

## 2023-04-14 ENCOUNTER — Other Ambulatory Visit: Payer: Self-pay | Admitting: Cardiovascular Disease

## 2023-04-14 DIAGNOSIS — I4891 Unspecified atrial fibrillation: Secondary | ICD-10-CM

## 2023-04-16 NOTE — Telephone Encounter (Signed)
Prescription refill request for Eliquis received. Indication:afib Last office visit:10/24 Scr:1.10  11/24 Age: 52 Weight:110.2  kg  Prescription refilled

## 2023-04-22 ENCOUNTER — Other Ambulatory Visit: Payer: Self-pay | Admitting: Nurse Practitioner

## 2023-04-23 ENCOUNTER — Encounter: Payer: Self-pay | Admitting: Family Medicine

## 2023-04-23 ENCOUNTER — Ambulatory Visit: Payer: No Typology Code available for payment source | Admitting: Family Medicine

## 2023-04-23 VITALS — BP 132/80 | HR 62 | Temp 97.8°F | Ht 72.5 in | Wt 239.0 lb

## 2023-04-23 DIAGNOSIS — R7303 Prediabetes: Secondary | ICD-10-CM | POA: Diagnosis not present

## 2023-04-23 DIAGNOSIS — Z1329 Encounter for screening for other suspected endocrine disorder: Secondary | ICD-10-CM | POA: Diagnosis not present

## 2023-04-23 DIAGNOSIS — E876 Hypokalemia: Secondary | ICD-10-CM

## 2023-04-23 DIAGNOSIS — I10 Essential (primary) hypertension: Secondary | ICD-10-CM

## 2023-04-23 DIAGNOSIS — Z0000 Encounter for general adult medical examination without abnormal findings: Secondary | ICD-10-CM | POA: Diagnosis not present

## 2023-04-23 DIAGNOSIS — R42 Dizziness and giddiness: Secondary | ICD-10-CM

## 2023-04-23 DIAGNOSIS — E782 Mixed hyperlipidemia: Secondary | ICD-10-CM | POA: Diagnosis not present

## 2023-04-23 DIAGNOSIS — R202 Paresthesia of skin: Secondary | ICD-10-CM

## 2023-04-23 DIAGNOSIS — I480 Paroxysmal atrial fibrillation: Secondary | ICD-10-CM

## 2023-04-23 LAB — COMPREHENSIVE METABOLIC PANEL
ALT: 16 U/L (ref 0–53)
AST: 18 U/L (ref 0–37)
Albumin: 4.3 g/dL (ref 3.5–5.2)
Alkaline Phosphatase: 45 U/L (ref 39–117)
BUN: 13 mg/dL (ref 6–23)
CO2: 27 meq/L (ref 19–32)
Calcium: 9.4 mg/dL (ref 8.4–10.5)
Chloride: 103 meq/L (ref 96–112)
Creatinine, Ser: 0.99 mg/dL (ref 0.40–1.50)
GFR: 86.18 mL/min (ref >60.00)
Glucose, Bld: 95 mg/dL (ref 70–99)
Potassium: 4.6 meq/L (ref 3.5–5.1)
Sodium: 139 meq/L (ref 135–145)
Total Bilirubin: 0.8 mg/dL (ref 0.2–1.2)
Total Protein: 7.6 g/dL (ref 6.0–8.3)

## 2023-04-23 LAB — LIPID PANEL
Cholesterol: 205 mg/dL — ABNORMAL HIGH (ref 0–200)
HDL: 45.6 mg/dL (ref >39.00)
LDL Cholesterol: 144 mg/dL — ABNORMAL HIGH (ref 0–99)
NonHDL: 159.72
Total CHOL/HDL Ratio: 5
Triglycerides: 80 mg/dL (ref 0.0–149.0)
VLDL: 16 mg/dL (ref 0.0–40.0)

## 2023-04-23 LAB — CBC WITH DIFFERENTIAL/PLATELET
Basophils Absolute: 0 10*3/uL (ref 0.0–0.1)
Basophils Relative: 0.7 % (ref 0.0–3.0)
Eosinophils Absolute: 0.1 10*3/uL (ref 0.0–0.7)
Eosinophils Relative: 2.7 % (ref 0.0–5.0)
HCT: 43.1 % (ref 39.0–52.0)
Hemoglobin: 14.2 g/dL (ref 13.0–17.0)
Lymphocytes Relative: 46.6 % — ABNORMAL HIGH (ref 12.0–46.0)
Lymphs Abs: 2.1 10*3/uL (ref 0.7–4.0)
MCHC: 32.9 g/dL (ref 30.0–36.0)
MCV: 86.8 fL (ref 78.0–100.0)
Monocytes Absolute: 0.6 10*3/uL (ref 0.1–1.0)
Monocytes Relative: 12.3 % — ABNORMAL HIGH (ref 3.0–12.0)
Neutro Abs: 1.7 10*3/uL (ref 1.4–7.7)
Neutrophils Relative %: 37.7 % — ABNORMAL LOW (ref 43.0–77.0)
Platelets: 298 10*3/uL (ref 150.0–400.0)
RBC: 4.96 Mil/uL (ref 4.22–5.81)
RDW: 14.7 % (ref 11.5–15.5)
WBC: 4.5 10*3/uL (ref 4.0–10.5)

## 2023-04-23 LAB — HEMOGLOBIN A1C: Hgb A1c MFr Bld: 5.9 % (ref 4.6–6.5)

## 2023-04-23 LAB — TSH: TSH: 1.43 u[IU]/mL (ref 0.35–5.50)

## 2023-04-23 NOTE — Progress Notes (Unsigned)
Subjective:  Patient ID: Donald [name], male    DOB: 25-May-1971  Age: 52 y.o. MRN: [id number]  CC:  Chief Complaint  Patient presents with   Annual Exam    Pt is doing well, no concerns, pt is fasting     HPI Donald [name] presents for Annual Exam PCP, [facility], Dr. Eden Emms, seen by Robin Searing, NP on October 21.  History of nonobstructive CAD, paroxysmal A-fib, right bundle branch block, SVT, hypertension, hyperlipidemia.  Noted to be orthostatic at his October 21 visit, Imdur was decreased to 15 mg daily with 15 mg as needed for chest pain.  PET/CT planned.  Plan for sleep study.  Continued on Eliquis 5 mg twice daily for A-fib.  Verapamil 120 mg for rate control with as needed 40 mg additional for elevated heart rate.  Continued on Imdur as above, pravastatin 20 mg daily for CAD.  Losartan and verapamil for hypertension. Low risk cardiac PET-CT in October, no evidence of ischemia.  Has not completed sleep test - encouraged to have per performed and to d/w cardiology if needed.   Dizziness, hypokalemia Recent ER visit noted on November 3.  Intermittent dizziness with left-sided facial tingling.  Symptoms had resolved by the time of ED assessment.  No focal neurologic deficits.  CT head was negative for acute abnormality.  Mild hypokalemia, EKG was sinus rhythm.  Did not appear to be consistent with CVA.  Initial blood pressure was high in ER that improved without intervention.  Provided p.o. potassium.  PCP follow-up planned. Taking otc potassium.  No recurrence of face tingling or dizziness - feeling ok now.   BP Readings from Last 3 Encounters:  04/23/23 132/80  04/08/23 (!) 137/94  03/27/23 121/89   Lab Results  Component Value Date   K 3.3 (L) 04/08/2023   GERD:  Taking protonix daily - stable - no breakthrough sx's. Prior Hpylori in 2022.   Hyperlipidemia: Pravastatin 20mg  every day.  Lab Results  Component Value Date   CHOL 197 10/09/2022   HDL 48.20  10/09/2022   LDLCALC 137 (H) 10/09/2022   TRIG 60.0 10/09/2022   CHOLHDL 4 10/09/2022   Lab Results  Component Value Date   ALT 15 04/08/2023   AST 21 04/08/2023   ALKPHOS 43 04/08/2023   BILITOT 0.7 04/08/2023        04/23/2023    8:32 AM 10/09/2022    8:18 AM 07/10/2022   10:15 AM 05/01/2022   10:52 AM 04/17/2022    8:34 AM  Depression screen PHQ 2/9  Decreased Interest 0 0 0 0 0  Down, Depressed, Hopeless 0 0 0 0 0  PHQ - 2 Score 0 0 0 0 0  Altered sleeping 1 1  0 1  Tired, decreased energy 1 1  0 1  Change in appetite 0 0  0 0  Feeling bad or failure about yourself  0 0  0 0  Trouble concentrating 0 0  0 0  Moving slowly or fidgety/restless 0 0  0 0  Suicidal thoughts 0 0  0 0  PHQ-9 Score 2 2  0 2  Difficult doing work/chores Somewhat difficult Not difficult at all       Health Maintenance  Topic Date Due   Zoster Vaccines- Shingrix (1 of 2) Never done   INFLUENZA VACCINE  01/04/2023   COVID-19 Vaccine (3 - 2023-24 season) 02/04/2023   DTaP/Tdap/Td (3 - Td or Tdap) 03/03/2027   Colonoscopy  01/25/2028  Hepatitis C Screening  Completed   HIV Screening  Completed   HPV VACCINES  Aged Out  Colonoscopy 01/24/2021.  Tubular adenoma, repeat 7 years. Prostate: does not  have family history of prostate cancer The natural history of prostate cancer and ongoing controversy regarding screening and potential treatment outcomes of prostate cancer has been discussed with the patient. The meaning of a false positive PSA and a false negative PSA has been discussed. He indicates understanding of the limitations of this screening test and wishes NOT to proceed with screening PSA testing at this time - will consider next year.  Lab Results  Component Value Date   PSA1 0.4 01/15/2019   PSA1 0.4 03/02/2017   PSA 0.27 05/01/2022   PSA 0.28 08/19/2020   PSA 0.39 05/17/2015    Immunization History  Administered Date(s) Administered   Influenza Whole 03/09/2010    Influenza,inj,Quad PF,6+ Mos 03/02/2017, 01/15/2019, 03/05/2020, 06/04/2022   PFIZER(Purple Top)SARS-COV-2 Vaccination 02/28/2020, 03/23/2020   Td 03/09/2010   Tdap 03/02/2017  Flu vaccine declines - recommended as soon as possible  Covid booster - plans at pharmacy.  Shingrix: declines.   No results found. Optho last year.   Dental: every 13mo.  Alcohol: none  Tobacco: none  Exercise: walking 3 days per week - 20-18min.   History Patient Active Problem List   Diagnosis Date Noted   CAD (coronary artery disease) 11/19/2022   Chest pain 11/06/2022   Hypocalcemia 11/06/2022   Normocytic anemia 11/06/2022   Paroxysmal atrial fibrillation (HCC) 07/10/2022   Atrial fibrillation with RVR (HCC) 06/03/2022   Atypical chest pain 04/02/2021   HLD (hyperlipidemia) 08/25/2020   Encounter for general adult medical examination with abnormal findings 08/19/2020   Prediabetes 08/19/2020   Screen for colon cancer 08/19/2020   Diuretic-induced hypokalemia 08/19/2020   Chronic bilateral low back pain without sciatica 08/19/2020   Hypokalemia 01/20/2016   Right bundle branch block 03/11/2010   Essential hypertension 05/14/2009   PARASOMNIA 05/14/2009   G E R D 05/13/2009   Past Medical History:  Diagnosis Date   Blood transfusion without reported diagnosis    Chronic bilateral low back pain without sciatica 08/19/2020   DEPRESSION, MILD    Dysuria    G E R D    Hypercholesterolemia    Hyperlipidemia    NOT TAKING MEDICATION   Hypertension    Lumbago    NEPHROLITHIASIS, HX OF    PARASOMNIA    Paroxysmal atrial fibrillation (HCC) 07/10/2022   Prediabetes 08/19/2020   Right bundle branch block    Past Surgical History:  Procedure Laterality Date   CARDIAC CATHETERIZATION N/A 01/19/2016   Procedure: Left Heart Cath and Coronary Angiography;  Surgeon: Tonny Bollman, MD;  Location: [facility] INVASIVE CV LAB;  Service: Cardiovascular;  Laterality: N/A;   HAND SURGERY     right and left  hand     Allergies  Allergen Reactions   Ace Inhibitors Cough    Lisinopril caused cough   Pork-Derived Products    Prior to Admission medications   Medication Sig Start Date End Date Taking? Authorizing Provider  Cholecalciferol (VITAMIN D3) 50 MCG (2000 UT) TABS Take one tablet by mouth daily. 04/25/21  Yes Shade Flood, MD  Diclofenac Sodium 1 % CREA Apply 4 g topically 4 (four) times daily as needed. 12/21/22  Yes Shade Flood, MD  ELIQUIS 5 MG TABS tablet TAKE 1 TABLET BY MOUTH TWICE A DAY 04/16/23  Yes Wendall Stade, MD  hydrochlorothiazide (  HYDRODIURIL) 25 MG tablet TAKE 1 TABLET (25 MG TOTAL) BY MOUTH DAILY. 12/26/22  Yes Shade Flood, MD  ipratropium (ATROVENT) 0.06 % nasal spray Place 1-2 sprays into both nostrils 4 (four) times daily. As needed for nasal congestion 10/09/22  Yes Shade Flood, MD  isosorbide mononitrate (IMDUR) 30 MG 24 hr tablet Take 0.5 tablets (15 mg total) by mouth daily. Can take an additional half tab as needed for chest pain 03/26/23  Yes Gaston Islam., NP  losartan (COZAAR) 100 MG tablet Take 1 tablet (100 mg total) by mouth daily. 10/09/22  Yes Shade Flood, MD  pantoprazole (PROTONIX) 40 MG tablet TAKE 1 TABLET BY MOUTH EVERY DAY 01/31/23  Yes Gaston Islam., NP  pravastatin (PRAVACHOL) 20 MG tablet Take 20 mg by mouth daily.   Yes [provider]  verapamil (CALAN) 40 MG tablet Take 1 tablet (40 mg total) by mouth daily as needed (breakthrough chest pain). 03/26/23  Yes Gaston Islam., NP  verapamil (CALAN-SR) 120 MG CR tablet Take 1 tablet (120 mg total) by mouth at bedtime. 07/03/22  Yes Wendall Stade, MD   Social History   Socioeconomic History   Marital status: Married    Spouse name: Not on file   Number of children: 6   Years of education: 15   Highest education level: Not on file  Occupational History   Occupation: Location manager    Employer: [employer]  Tobacco Use   Smoking status:  Former    Types: Cigarettes   Smokeless tobacco: Never  Substance and Sexual Activity   Alcohol use: No    Alcohol/week: 0.0 standard drinks of alcohol   Drug use: No   Sexual activity: Not Currently  Other Topics Concern   Not on file  Social History Narrative   ** Merged History Encounter **       Social Determinants of Health   Financial Resource Strain: Not on file  Food Insecurity: No Food Insecurity (11/06/2022)   Hunger Vital Sign    Worried About Running Out of Food in the Last Year: Never true    Ran Out of Food in the Last Year: Never true  Transportation Needs: No Transportation Needs (11/06/2022)   PRAPARE - Administrator, Civil Service (Medical): No    Lack of Transportation (Non-Medical): No  Physical Activity: Not on file  Stress: Not on file  Social Connections: Unknown (10/18/2021)   [facility] from [facility]   Social Network    Social Network: Not on file  Intimate Partner Violence: Not At Risk (11/06/2022)   Humiliation, Afraid, Rape, and Kick questionnaire    Fear of Current or Ex-Partner: No    Emotionally Abused: No    Physically Abused: No    Sexually Abused: No    Review of Systems 13 point review of systems per patient health survey noted.  Negative other than as indicated above or in HPI.    Objective:   Vitals:   04/23/23 0832  BP: 132/80  Pulse: 62  Temp: 97.8 F (36.6 C)  TempSrc: Temporal  SpO2: 100%  Weight: 239 lb (108.4 kg)  Height: 6' 0.5" (1.842 m)   {Vitals History (Optional):23777}  Physical Exam Vitals reviewed.  Constitutional:      Appearance: He is well-developed.  HENT:     Head: Normocephalic and atraumatic.     Right Ear: External ear normal.  Left Ear: External ear normal.  Eyes:     Conjunctiva/sclera: Conjunctivae normal.     Pupils: Pupils are equal, round, and reactive to light.  Neck:     Thyroid: No thyromegaly.  Cardiovascular:     Rate and Rhythm: Normal rate and  regular rhythm.     Heart sounds: Normal heart sounds.  Pulmonary:     Effort: Pulmonary effort is normal. No respiratory distress.     Breath sounds: Normal breath sounds. No wheezing.  Abdominal:     General: There is no distension.     Palpations: Abdomen is soft.     Tenderness: There is no abdominal tenderness.  Musculoskeletal:        General: No tenderness. Normal range of motion.     Cervical back: Normal range of motion and neck supple.  Lymphadenopathy:     Cervical: No cervical adenopathy.  Skin:    General: Skin is warm and dry.  Neurological:     General: No focal deficit present.     Mental Status: He is alert and oriented to person, place, and time.     Deep Tendon Reflexes: Reflexes are normal and symmetric.     Comments: Equal facial movements, normal speech.  Sensation intact and equal in face bilaterally.  Nonfocal exam.  Psychiatric:        Behavior: Behavior normal.        Assessment & Plan:  Donald [name] is a 52 y.o. male . Annual physical exam - Plan: CBC with Differential/Platelet, Comprehensive metabolic panel, Lipid panel, TSH, Hemoglobin A1c  Prediabetes - Plan: Comprehensive metabolic panel, Hemoglobin A1c  Mixed hyperlipidemia - Plan: Comprehensive metabolic panel, Lipid panel  Essential hypertension - Plan: CBC with Differential/Platelet  Screening for thyroid disorder - Plan: TSH  Dizziness - Plan: CBC with Differential/Platelet  Tingling of face  Hypokalemia - Plan: Comprehensive metabolic panel  Paroxysmal atrial fibrillation (HCC) - Plan: CBC with Differential/Platelet   No orders of the defined types were placed in this encounter.  Patient Instructions  Glad to hear you are better from the ER.  Return to the clinic or go to the nearest emergency room if any of your symptoms worsen or new symptoms occur.  I do recommend covid and flu vaccine as son as you are able. Shingles vaccine also recommended.   I will check labs  and let you know if any concerns including potassium but okay to stay on over-the-counter supplement for now.  Thank you for coming in today and take care!  Preventive Care 48-46 Years Old, Male Preventive care refers to lifestyle choices and visits with your health care provider that can promote health and wellness. Preventive care visits are also called wellness exams. What can I expect for my preventive care visit? Counseling During your preventive care visit, your health care provider may ask about your: Medical history, including: Past medical problems. Family medical history. Current health, including: Emotional well-being. Home life and relationship well-being. Sexual activity. Lifestyle, including: Alcohol, nicotine or tobacco, and drug use. Access to firearms. Diet, exercise, and sleep habits. Safety issues such as seatbelt and bike helmet use. Sunscreen use. Work and work Astronomer. Physical exam Your health care provider will check your: Height and weight. These may be used to calculate your BMI (body mass index). BMI is a measurement that tells if you are at a healthy weight. Waist circumference. This measures the distance around your waistline. This measurement also tells if you are at a healthy  weight and may help predict your risk of certain diseases, such as type 2 diabetes and high blood pressure. Heart rate and blood pressure. Body temperature. Skin for abnormal spots. What immunizations do I need?  Vaccines are usually given at various ages, according to a schedule. Your health care provider will recommend vaccines for you based on your age, medical history, and lifestyle or other factors, such as travel or where you work. What tests do I need? Screening Your health care provider may recommend screening tests for certain conditions. This may include: Lipid and cholesterol levels. Diabetes screening. This is done by checking your blood sugar (glucose) after  you have not eaten for a while (fasting). Hepatitis B test. Hepatitis C test. HIV (human immunodeficiency virus) test. STI (sexually transmitted infection) testing, if you are at risk. Lung cancer screening. Prostate cancer screening. Colorectal cancer screening. Talk with your health care provider about your test results, treatment options, and if necessary, the need for more tests. Follow these instructions at home: Eating and drinking  Eat a diet that includes fresh fruits and vegetables, whole grains, lean protein, and low-fat dairy products. Take vitamin and mineral supplements as recommended by your health care provider. Do not drink alcohol if your health care provider tells you not to drink. If you drink alcohol: Limit how much you have to 0-2 drinks a day. Know how much alcohol is in your drink. In the [place] one drink equals one 12 oz bottle of beer (355 mL), one 5 oz glass of wine (148 mL), or one 1 oz glass of hard liquor (44 mL). Lifestyle Brush your teeth every morning and night with fluoride toothpaste. Floss one time each day. Exercise for at least 30 minutes 5 or more days each week. Do not use any products that contain nicotine or tobacco. These products include cigarettes, chewing tobacco, and vaping devices, such as e-cigarettes. If you need help quitting, ask your health care provider. Do not use drugs. If you are sexually active, practice safe sex. Use a condom or other form of protection to prevent STIs. Take aspirin only as told by your health care provider. Make sure that you understand how much to take and what form to take. Work with your health care provider to find out whether it is safe and beneficial for you to take aspirin daily. Find healthy ways to manage stress, such as: Meditation, yoga, or listening to music. Journaling. Talking to a trusted person. Spending time with friends and family. Minimize exposure to UV radiation to reduce your risk of skin  cancer. Safety Always wear your seat belt while driving or riding in a vehicle. Do not drive: If you have been drinking alcohol. Do not ride with someone who has been drinking. When you are tired or distracted. While texting. If you have been using any mind-altering substances or drugs. Wear a helmet and other protective equipment during sports activities. If you have firearms in your house, make sure you follow all gun safety procedures. What's next? Go to your health care provider once a year for an annual wellness visit. Ask your health care provider how often you should have your eyes and teeth checked. Stay up to date on all vaccines. This information is not intended to replace advice given to you by your health care provider. Make sure you discuss any questions you have with your health care provider. Document Revised: 11/17/2020 Document Reviewed: 11/17/2020 Elsevier Patient Education  2024 [employer].  Signed,   Meredith Staggers, MD [facility], [facility] 04/23/23 9:38 AM

## 2023-04-23 NOTE — Patient Instructions (Addendum)
Glad to hear you are better from the ER.  Return to the clinic or go to the nearest emergency room if any of your symptoms worsen or new symptoms occur.  I do recommend covid and flu vaccine as son as you are able. Shingles vaccine also recommended.   I will check labs and let you know if any concerns including potassium but okay to stay on over-the-counter supplement for now.  Thank you for coming in today and take care!  Preventive Care 27-54 Years Old, Male Preventive care refers to lifestyle choices and visits with your health care provider that can promote health and wellness. Preventive care visits are also called wellness exams. What can I expect for my preventive care visit? Counseling During your preventive care visit, your health care provider may ask about your: Medical history, including: Past medical problems. Family medical history. Current health, including: Emotional well-being. Home life and relationship well-being. Sexual activity. Lifestyle, including: Alcohol, nicotine or tobacco, and drug use. Access to firearms. Diet, exercise, and sleep habits. Safety issues such as seatbelt and bike helmet use. Sunscreen use. Work and work Astronomer. Physical exam Your health care provider will check your: Height and weight. These may be used to calculate your BMI (body mass index). BMI is a measurement that tells if you are at a healthy weight. Waist circumference. This measures the distance around your waistline. This measurement also tells if you are at a healthy weight and may help predict your risk of certain diseases, such as type 2 diabetes and high blood pressure. Heart rate and blood pressure. Body temperature. Skin for abnormal spots. What immunizations do I need?  Vaccines are usually given at various ages, according to a schedule. Your health care provider will recommend vaccines for you based on your age, medical history, and lifestyle or other factors, such  as travel or where you work. What tests do I need? Screening Your health care provider may recommend screening tests for certain conditions. This may include: Lipid and cholesterol levels. Diabetes screening. This is done by checking your blood sugar (glucose) after you have not eaten for a while (fasting). Hepatitis B test. Hepatitis C test. HIV (human immunodeficiency virus) test. STI (sexually transmitted infection) testing, if you are at risk. Lung cancer screening. Prostate cancer screening. Colorectal cancer screening. Talk with your health care provider about your test results, treatment options, and if necessary, the need for more tests. Follow these instructions at home: Eating and drinking  Eat a diet that includes fresh fruits and vegetables, whole grains, lean protein, and low-fat dairy products. Take vitamin and mineral supplements as recommended by your health care provider. Do not drink alcohol if your health care provider tells you not to drink. If you drink alcohol: Limit how much you have to 0-2 drinks a day. Know how much alcohol is in your drink. In the [place] one drink equals one 12 oz bottle of beer (355 mL), one 5 oz glass of wine (148 mL), or one 1 oz glass of hard liquor (44 mL). Lifestyle Brush your teeth every morning and night with fluoride toothpaste. Floss one time each day. Exercise for at least 30 minutes 5 or more days each week. Do not use any products that contain nicotine or tobacco. These products include cigarettes, chewing tobacco, and vaping devices, such as e-cigarettes. If you need help quitting, ask your health care provider. Do not use drugs. If you are sexually active, practice safe sex. Use a condom or other  form of protection to prevent STIs. Take aspirin only as told by your health care provider. Make sure that you understand how much to take and what form to take. Work with your health care provider to find out whether it is safe and  beneficial for you to take aspirin daily. Find healthy ways to manage stress, such as: Meditation, yoga, or listening to music. Journaling. Talking to a trusted person. Spending time with friends and family. Minimize exposure to UV radiation to reduce your risk of skin cancer. Safety Always wear your seat belt while driving or riding in a vehicle. Do not drive: If you have been drinking alcohol. Do not ride with someone who has been drinking. When you are tired or distracted. While texting. If you have been using any mind-altering substances or drugs. Wear a helmet and other protective equipment during sports activities. If you have firearms in your house, make sure you follow all gun safety procedures. What's next? Go to your health care provider once a year for an annual wellness visit. Ask your health care provider how often you should have your eyes and teeth checked. Stay up to date on all vaccines. This information is not intended to replace advice given to you by your health care provider. Make sure you discuss any questions you have with your health care provider. Document Revised: 11/17/2020 Document Reviewed: 11/17/2020 Elsevier Patient Education  2024 [employer].

## 2023-04-24 ENCOUNTER — Encounter: Payer: Self-pay | Admitting: Family Medicine

## 2023-04-25 NOTE — Addendum Note (Signed)
Addended by: Eldred Manges on: 04/25/2023 03:13 PM   Modules accepted: Orders

## 2023-04-30 NOTE — Progress Notes (Signed)
CARDIOLOGY CONSULT NOTE       Patient ID: Donald [name] MRN: [id number] DOB/AGE: 1971-04-27 52 y.o.  Primary Physician: Shade Flood, MD Primary Cardiologist: Eden Emms   HPI:  52 y.o. first seen January 2021 for chest pain.  History of GERD, HLD, HTN and RBBB. Pain atypical. Had non obstructive cath by Dr Excell Seltzer in August 2017 with only 40% Ramus disease   He is from [place]. 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 Converted with iv cardizem and d/c with Cardizem CD and Eliquis [place] VASC 2  Not tolerating cardizem makes him feel foggy/tired F/U 14 day Zio with short runds SVT per Dr Elberta Fortis  TTE:  05/14/2023 reviewed no bad valves normal EF LA 38 mm   Imdur decreased for some postural symptoms     ROS All other systems reviewed and negative except as noted above  Past Medical History:  Diagnosis Date   Blood transfusion without reported diagnosis    Chronic bilateral low back pain without sciatica 08/19/2020   DEPRESSION, MILD    Dysuria    G E R D    Hypercholesterolemia    Hyperlipidemia    NOT TAKING MEDICATION   Hypertension    Lumbago    NEPHROLITHIASIS, HX OF    PARASOMNIA    Paroxysmal atrial fibrillation (HCC) 07/10/2022   Prediabetes 08/19/2020   Right bundle branch block     Family History  Problem Relation Age of Onset   Hypertension Mother    Hypertension Sister     Social History   Socioeconomic History   Marital status: Married    Spouse name: Not on file   Number of children: 6   Years of education: 15   Highest education level: Not on file  Occupational History   Occupation: Location manager    Employer:  [employer]  Tobacco Use   Smoking status: Former    Types: Cigarettes   Smokeless tobacco: Never  Substance and Sexual Activity   Alcohol use: No    Alcohol/week: 0.0 standard drinks of alcohol   Drug use: No   Sexual activity: Not Currently  Other Topics Concern   Not on file  Social History Narrative   ** Merged History Encounter **       Social Determinants of Health   Financial Resource Strain: Not on file  Food Insecurity: No Food Insecurity (11/06/2022)   Hunger Vital Sign    Worried About Running Out of Food in the Last Year: Never true    Ran Out of Food in the Last Year: Never true  Transportation Needs: No Transportation Needs (11/06/2022)   PRAPARE - Administrator, Civil Service (Medical): No    Lack of Transportation (Non-Medical): No  Physical Activity: Not on file  Stress: Not on file  Social Connections: Unknown (10/18/2021)   [facility]   Social Network    Social Network: Not on file  Intimate Partner Violence: Not At Risk (11/06/2022)   Humiliation, Afraid, Rape, and Kick questionnaire    Fear of Current or Ex-Partner: No    Emotionally Abused:  No    Physically Abused: No    Sexually Abused: No    Past Surgical History:  Procedure Laterality Date   CARDIAC CATHETERIZATION N/A 01/19/2016   Procedure: Left Heart Cath and Coronary Angiography;  Surgeon: Tonny Bollman, MD;  Location: [facility] INVASIVE CV LAB;  Service: Cardiovascular;  Laterality: N/A;   HAND SURGERY     right and left hand        Current Outpatient Medications:    Cholecalciferol (VITAMIN D3) 50 MCG (2000 UT) TABS, Take one tablet by mouth daily., Disp: 90 tablet, Rfl: 1   Diclofenac Sodium 1 % CREA, Apply 4 g topically 4 (four) times daily as needed., Disp: 120 g, Rfl: 1   ELIQUIS 5 MG TABS tablet, TAKE 1 TABLET BY MOUTH TWICE A DAY, Disp: 180 tablet, Rfl: 1   hydrochlorothiazide (HYDRODIURIL) 25 MG tablet, TAKE 1 TABLET (25 MG TOTAL) BY MOUTH  DAILY., Disp: 90 tablet, Rfl: 1   ipratropium (ATROVENT) 0.06 % nasal spray, Place 1-2 sprays into both nostrils 4 (four) times daily. As needed for nasal congestion, Disp: 15 mL, Rfl: 5   isosorbide mononitrate (IMDUR) 30 MG 24 hr tablet, Take 0.5 tablets (15 mg total) by mouth daily. Can take an additional half tab as needed for chest pain, Disp: 90 tablet, Rfl: 0   losartan (COZAAR) 100 MG tablet, Take 1 tablet (100 mg total) by mouth daily., Disp: 90 tablet, Rfl: 1   pantoprazole (PROTONIX) 40 MG tablet, TAKE 1 TABLET BY MOUTH EVERY DAY, Disp: 90 tablet, Rfl: 0   pravastatin (PRAVACHOL) 20 MG tablet, Take 20 mg by mouth daily., Disp: , Rfl:    verapamil (CALAN) 40 MG tablet, TAKE 1 TABLET (40 MG TOTAL) BY MOUTH DAILY AS NEEDED (BREAKTHROUGH CHEST PAIN)., Disp: 90 tablet, Rfl: 3   verapamil (CALAN-SR) 120 MG CR tablet, Take 1 tablet (120 mg total) by mouth at bedtime., Disp: 90 tablet, Rfl: 3    Physical Exam: Blood pressure (!) 178/84, pulse 82, height 6\' 2"  (1.88 m), weight 246 lb (111.6 kg), SpO2 97%.    Affect appropriate Healthy:  appears stated age HEENT: normal Neck supple with no adenopathy JVP normal no bruits no thyromegaly Lungs clear with no wheezing and good diaphragmatic motion Heart:  S1/S2 no murmur, no rub, gallop or click PMI normal Abdomen: benighn, BS positve, no tenderness, no AAA no bruit.  No HSM or HJR Distal pulses intact with no bruits No edema Neuro non-focal Skin warm and dry No muscular weakness    Labs:   Lab Results  Component Value Date   WBC 4.5 04/23/2023   HGB 14.2 04/23/2023   HCT 43.1 04/23/2023   MCV 86.8 04/23/2023   PLT 298.0 04/23/2023    No results for input(s): "NA", "K", "CL", "CO2", "BUN", "CREATININE", "CALCIUM", "PROT", "BILITOT", "ALKPHOS", "ALT", "AST", "GLUCOSE" in the last 168 hours.  Invalid input(s): "LABALBU"  Lab Results  Component Value Date   CKTOTAL 191 06/18/2019   CKMB 3.6 01/19/2016   TROPONINI <0.03  09/23/2018    Lab Results  Component Value Date   CHOL 205 (H) 04/23/2023   CHOL 197 10/09/2022   CHOL 190 05/01/2022   Lab Results  Component Value Date   HDL 45.60 04/23/2023   HDL 48.20 10/09/2022   HDL 49.00 05/01/2022   Lab Results  Component Value Date   LDLCALC 144 (H) 04/23/2023   LDLCALC 137 (H) 10/09/2022   LDLCALC 126 (H) 05/01/2022   Lab Results  Component  Value Date   TRIG 80.0 04/23/2023   TRIG 60.0 10/09/2022   TRIG 76.0 05/01/2022   Lab Results  Component Value Date   CHOLHDL 5 04/23/2023   CHOLHDL 4 10/09/2022   CHOLHDL 4 05/01/2022   No results found for: "LDLDIRECT"    Radiology: No results found.  EKG: NSR rate 83 normal 06/29/19   ASSESSMENT AND PLAN:    1. Chest Pain:  Atypical normal myovue 07/11/19 40% RAMUS at cath in 2017 continue medical Rx NM PET CT normal 03/2023 EF 67% normal perfusion and MBPR 2.76 2. HTN:  Well controlled continue low sodium DASH diet and current meds  3. HLD:  On statin labs with primary  4. GI:  Abdominal pain RUQ [place] normal 09/02/20   MRI abdomen with hemorrhagic cyst improved  6. PAF:  06/03/22 CHADVASC 2 Cardizem not tolerated on verapamil continue eliquis TTE 07/04/22 EF 60-65% mild LAE aorta 3.8 cm     F/U cardiology in a year   Signed: Charlton Haws 05/14/2023, 8:25 AM

## 2023-05-14 ENCOUNTER — Ambulatory Visit
Payer: No Typology Code available for payment source | Attending: Cardiovascular Disease | Admitting: Cardiovascular Disease

## 2023-05-14 ENCOUNTER — Encounter: Payer: Self-pay | Admitting: Cardiovascular Disease

## 2023-05-14 VITALS — BP 178/84 | HR 82 | Ht 74.0 in | Wt 246.0 lb

## 2023-05-14 DIAGNOSIS — Z7901 Long term (current) use of anticoagulants: Secondary | ICD-10-CM | POA: Diagnosis not present

## 2023-05-14 DIAGNOSIS — I480 Paroxysmal atrial fibrillation: Secondary | ICD-10-CM | POA: Diagnosis not present

## 2023-05-14 DIAGNOSIS — I10 Essential (primary) hypertension: Secondary | ICD-10-CM

## 2023-05-14 NOTE — Telephone Encounter (Signed)
**Note De-Identified Donald [name] Obfuscation** Letter received from [employer] stating that yhey have denied the pts Split Night sleep study as follows:  We looked at your benefit plan and medical guidelines. This service request is covered if certain criteria are met. The notes from your provider do not show:  A medical condition that would require a sleep test with an attendant watching.  There is concern for a sleep disorder other than obstructive sleep apnea (OSA).  You have continued or new symptoms despite current treatment. Based on our review, we are not able to approve this service because criteria for coverage are not met. This service is not medically necessary. You may be eligible for a home sleep study and a trial of a device to help keep your airway open when you sleep if sleep apnea is confirmed.

## 2023-05-14 NOTE — Patient Instructions (Signed)

## 2023-06-21 ENCOUNTER — Other Ambulatory Visit: Payer: Self-pay | Admitting: Family Medicine

## 2023-06-21 ENCOUNTER — Other Ambulatory Visit: Payer: Self-pay | Admitting: Cardiovascular Disease

## 2023-06-21 DIAGNOSIS — E785 Hyperlipidemia, unspecified: Secondary | ICD-10-CM

## 2023-06-21 NOTE — Telephone Encounter (Signed)
Hyperlipidemia discussed at his April 23, 2023 visit.  Pravastatin 20 mg daily.  I do see this listed as historical medication, but I have prescribed that medication previously including in May 2024.  Refilled.

## 2023-06-21 NOTE — Telephone Encounter (Signed)
Record indicates this is from a "historical provider" please advise if we can send this in

## 2023-06-23 ENCOUNTER — Other Ambulatory Visit: Payer: Self-pay | Admitting: Family Medicine

## 2023-06-23 DIAGNOSIS — I10 Essential (primary) hypertension: Secondary | ICD-10-CM

## 2023-07-14 ENCOUNTER — Other Ambulatory Visit: Payer: Self-pay | Admitting: Nurse Practitioner

## 2023-08-25 ENCOUNTER — Other Ambulatory Visit (HOSPITAL_COMMUNITY): Payer: Self-pay

## 2023-09-05 ENCOUNTER — Telehealth: Payer: Self-pay | Admitting: Cardiovascular Disease

## 2023-09-05 NOTE — Telephone Encounter (Signed)
 I spoke with patient through interpreter [phone]).  He reports he has been having strange heartbeats every 2-3 days.  Feels like a strong heart beat. Also feels like it is skipping.  Lasts a few seconds.  Started about 1-2 months ago.  Reports he did not have these feelings prior to 1-2 months ago.  No chest pain.  I told patient message would be sent to Dr Eden Emms and we would call him back with his recommendations.

## 2023-09-05 NOTE — Telephone Encounter (Signed)
  Per MyChart scheduling message:  Patient c/o Palpitations: STAT if patient c/o lightheadedness, shortness of breath, or chest pain  How long have you had palpitations/irregular HR/ Afib? Are you having the symptoms now?   Are you currently experiencing lightheadedness, SOB or CP?   Do you have a history of afib (atrial fibrillation) or irregular heart rhythm?   Have you checked your BP or HR? (document readings if available):   Are you experiencing any other symptoms?    I feel different heartbeats every two days  1-No I don't have the symptoms now  2-No 3-yes 4-yes 5-No

## 2023-09-05 NOTE — Telephone Encounter (Signed)
 Donald Stade, MD to Me     09/05/23 10:34 AM He's had PAF needs to see PA/afib clijnic with ECG and if no afib on ECG can get 30 day monitor  Message left by interpreter(#[phone]) to call office.  Message sent to patient through my chart also

## 2023-09-06 NOTE — Telephone Encounter (Signed)
 Left message for patient to call back

## 2023-09-07 ENCOUNTER — Encounter (HOSPITAL_COMMUNITY): Payer: Self-pay | Admitting: Internal Medicine

## 2023-09-07 ENCOUNTER — Ambulatory Visit (HOSPITAL_COMMUNITY)
Admission: RE | Admit: 2023-09-07 | Discharge: 2023-09-07 | Disposition: A | Discharge status: Home / Self Care | Source: Ambulatory Visit | Attending: Internal Medicine | Admitting: Internal Medicine

## 2023-09-07 VITALS — BP 162/90 | HR 80 | Ht 74.0 in | Wt 240.6 lb

## 2023-09-07 DIAGNOSIS — I2510 Atherosclerotic heart disease of native coronary artery without angina pectoris: Secondary | ICD-10-CM | POA: Insufficient documentation

## 2023-09-07 DIAGNOSIS — Z79899 Other long term (current) drug therapy: Secondary | ICD-10-CM | POA: Insufficient documentation

## 2023-09-07 DIAGNOSIS — D6869 Other thrombophilia: Secondary | ICD-10-CM | POA: Insufficient documentation

## 2023-09-07 DIAGNOSIS — I480 Paroxysmal atrial fibrillation: Secondary | ICD-10-CM | POA: Diagnosis not present

## 2023-09-07 DIAGNOSIS — Z7901 Long term (current) use of anticoagulants: Secondary | ICD-10-CM | POA: Diagnosis not present

## 2023-09-07 DIAGNOSIS — I10 Essential (primary) hypertension: Secondary | ICD-10-CM | POA: Diagnosis not present

## 2023-09-07 DIAGNOSIS — I4510 Unspecified right bundle-branch block: Secondary | ICD-10-CM | POA: Diagnosis not present

## 2023-09-07 DIAGNOSIS — I4891 Unspecified atrial fibrillation: Secondary | ICD-10-CM | POA: Diagnosis present

## 2023-09-07 DIAGNOSIS — E785 Hyperlipidemia, unspecified: Secondary | ICD-10-CM | POA: Insufficient documentation

## 2023-09-07 NOTE — Progress Notes (Signed)
 Primary Care Physician: Donald Flood, MD Referring Physician: ED f/u  Cardiologist: Dr. [name] is a 53 y.o. male with a history of essential hypertension, non obstructive CAD, hyperlipidemia, right bundle branch block, who is admitted to [facility] on 06/03/2022 with atrial fibrillation with RVR after presenting from home to [facility] ED complaining of palpitations.  He was started on heparin drip, Cardizem drip, he converted to sinus rhythm overnight, he was transitioned to Eliquis and Cardizem CD.   On follow up 09/07/23, he is currently in NSR. Patient called office yesterday noting intermittent skipping / fast heart beats over the past 1-2 months. They can occur every few days and occur while sitting. Last seen by Dr. Eden [name] on 05/14/23 and transitioned from Cardizem to verapamil due to feeling foggy/tired. He is tolerating verapamil without issue. No missed doses of verapamil or Eliquis.   Today, he denies symptoms of palpitations, chest pain, shortness of breath, orthopnea, PND, lower extremity edema, dizziness, presyncope, syncope, or neurologic sequela. The patient is tolerating medications without difficulties and is otherwise without complaint today.   Past Medical History:  Diagnosis Date   Blood transfusion without reported diagnosis    Chronic bilateral low back pain without sciatica 08/19/2020   DEPRESSION, MILD    Dysuria    G E R D    Hypercholesterolemia    Hyperlipidemia    NOT TAKING MEDICATION   Hypertension    Lumbago    NEPHROLITHIASIS, HX OF    PARASOMNIA    Paroxysmal atrial fibrillation (HCC) 07/10/2022   Prediabetes 08/19/2020   Right bundle branch block    Past Surgical History:  Procedure Laterality Date   CARDIAC CATHETERIZATION N/A 01/19/2016   Procedure: Left Heart Cath and Coronary Angiography;  Surgeon: Tonny Bollman, MD;  Location: [facility] INVASIVE CV LAB;  Service: Cardiovascular;  Laterality: N/A;   HAND SURGERY     right  and left hand      Current Outpatient Medications  Medication Sig Dispense Refill   Cholecalciferol (VITAMIN D3) 50 MCG (2000 UT) TABS Take one tablet by mouth daily. 90 tablet 1   Diclofenac Sodium 1 % CREA Apply 4 g topically 4 (four) times daily as needed. 120 g 1   ELIQUIS 5 MG TABS tablet TAKE 1 TABLET BY MOUTH TWICE A DAY 180 tablet 1   hydrochlorothiazide (HYDRODIURIL) 25 MG tablet TAKE 1 TABLET (25 MG TOTAL) BY MOUTH DAILY. 90 tablet 1   ipratropium (ATROVENT) 0.06 % nasal spray Place 1-2 sprays into both nostrils 4 (four) times daily. As needed for nasal congestion 15 mL 5   isosorbide mononitrate (IMDUR) 30 MG 24 hr tablet Take 0.5 tablets (15 mg total) by mouth daily. Can take an additional half tab as needed for chest pain 90 tablet 0   losartan (COZAAR) 100 MG tablet Take 1 tablet (100 mg total) by mouth daily. 90 tablet 1   pantoprazole (PROTONIX) 40 MG tablet TAKE 1 TABLET BY MOUTH EVERY DAY 90 tablet 2   pravastatin (PRAVACHOL) 20 MG tablet TAKE 1 TABLET BY MOUTH EVERY DAY 90 tablet 1   verapamil (CALAN) 40 MG tablet TAKE 1 TABLET (40 MG TOTAL) BY MOUTH DAILY AS NEEDED (BREAKTHROUGH CHEST PAIN). 90 tablet 3   verapamil (CALAN-SR) 120 MG CR tablet TAKE 1 TABLET BY MOUTH AT BEDTIME. 90 tablet 3   No current facility-administered medications for this encounter.    Allergies  Allergen Reactions   Ace Inhibitors  Cough    Lisinopril caused cough   Pork-Derived Products    ROS- All systems are reviewed and negative except as per the HPI above  Physical Exam: Vitals:   09/07/23 0931  BP: (!) 162/90  Pulse: 80  Weight: 109.1 kg  Height: 6\' 2"  (1.88 m)    Wt Readings from Last 3 Encounters:  09/07/23 109.1 kg  05/14/23 111.6 kg  04/23/23 108.4 kg    Labs: Lab Results  Component Value Date   NA 139 04/23/2023   K 4.6 04/23/2023   CL 103 04/23/2023   CO2 27 04/23/2023   GLUCOSE 95 04/23/2023   BUN 13 04/23/2023   CREATININE 0.99 04/23/2023   CALCIUM 9.4  04/23/2023   MG 1.9 06/04/2022   Lab Results  Component Value Date   INR 1.0 04/08/2023   Lab Results  Component Value Date   CHOL 205 (H) 04/23/2023   HDL 45.60 04/23/2023   LDLCALC 144 (H) 04/23/2023   TRIG 80.0 04/23/2023   GEN- The patient is well appearing, alert and oriented x 3 today.   Neck - no JVD or carotid bruit noted Lungs- Clear to ausculation bilaterally, normal work of breathing Heart- Regular rate and rhythm, no murmurs, rubs or gallops, PMI not laterally displaced Extremities- no clubbing, cyanosis, or edema Skin - no rash or ecchymosis noted   EKG-  Vent. rate 80 BPM PR interval 172 ms QRS duration 168 ms QT/QTcB 442/509 ms P-R-T axes 60 83 31 Normal sinus rhythm Right bundle branch block Abnormal ECG When compared with ECG of 08-Apr-2023 00:25, PREVIOUS ECG IS PRESENT  ECHO 07/03/22:  1. Left ventricular ejection fraction, by estimation, is 60 to 65%. The  left ventricle has normal function. The left ventricle has no regional  wall motion abnormalities. Left ventricular diastolic parameters were  normal.   2. Right ventricular systolic function is normal. The right ventricular  size is normal.   3. Left atrial size was mildly dilated.   4. The mitral valve is abnormal. Trivial mitral valve regurgitation. No  evidence of mitral stenosis.   5. The aortic valve is tricuspid. Aortic valve regurgitation is not  visualized. No aortic stenosis is present.   6. Aortic dilatation noted. There is mild dilatation of the aortic root,  measuring 38 mm.   7. The inferior vena cava is normal in size with greater than 50%  respiratory variability, suggesting right atrial pressure of 3 mmHg.   CHA2DS2-VASc Score = 2  The patient's score is based upon: CHF History: 0 HTN History: 1 Diabetes History: 0 Stroke History: 0 Vascular Disease History: 1 Age Score: 0 Gender Score: 0       ASSESSMENT AND PLAN: Paroxysmal Atrial Fibrillation (ICD10:  I48.0) The  patient's CHA2DS2-VASc score is 2, indicating a 2.2% annual risk of stroke.    He is currently in NSR. Due to intermittent palpitations being new over the past 1-2 months, will place 30 day monitor for further evaluation. Will contact patient with results.   Secondary Hypercoagulable State (ICD10:  D68.69) The patient is at significant risk for stroke/thromboembolism based upon his CHA2DS2-VASc Score of 2.  Continue Apixaban (Eliquis).  No missed doses.    Follow up will be based on monitor results.     Justin Mend, PA-C [facility] [address] [place], [place] [address] [phone]     2.

## 2023-09-13 ENCOUNTER — Encounter (HOSPITAL_COMMUNITY): Admission: EM | Disposition: A | Discharge status: Home / Self Care | Payer: Self-pay | Source: Home / Self Care

## 2023-09-13 ENCOUNTER — Telehealth (HOSPITAL_COMMUNITY): Payer: Self-pay | Admitting: Pharmacy Technician

## 2023-09-13 ENCOUNTER — Other Ambulatory Visit (HOSPITAL_COMMUNITY): Payer: Self-pay

## 2023-09-13 ENCOUNTER — Observation Stay (HOSPITAL_COMMUNITY)
Admission: EM | Admit: 2023-09-13 | Discharge: 2023-09-14 | Disposition: A | Discharge status: Home / Self Care | Attending: Internal Medicine | Admitting: Internal Medicine

## 2023-09-13 DIAGNOSIS — I10 Essential (primary) hypertension: Secondary | ICD-10-CM | POA: Diagnosis not present

## 2023-09-13 DIAGNOSIS — I25110 Atherosclerotic heart disease of native coronary artery with unstable angina pectoris: Principal | ICD-10-CM | POA: Insufficient documentation

## 2023-09-13 DIAGNOSIS — E785 Hyperlipidemia, unspecified: Secondary | ICD-10-CM | POA: Diagnosis not present

## 2023-09-13 DIAGNOSIS — Z7901 Long term (current) use of anticoagulants: Secondary | ICD-10-CM | POA: Insufficient documentation

## 2023-09-13 DIAGNOSIS — I200 Unstable angina: Principal | ICD-10-CM | POA: Diagnosis present

## 2023-09-13 DIAGNOSIS — I4510 Unspecified right bundle-branch block: Secondary | ICD-10-CM

## 2023-09-13 DIAGNOSIS — Z79899 Other long term (current) drug therapy: Secondary | ICD-10-CM | POA: Diagnosis not present

## 2023-09-13 DIAGNOSIS — I480 Paroxysmal atrial fibrillation: Secondary | ICD-10-CM | POA: Diagnosis not present

## 2023-09-13 DIAGNOSIS — Z87891 Personal history of nicotine dependence: Secondary | ICD-10-CM | POA: Diagnosis not present

## 2023-09-13 HISTORY — PX: LEFT HEART CATH AND CORONARY ANGIOGRAPHY: CATH118249

## 2023-09-13 LAB — CBC WITH DIFFERENTIAL/PLATELET
Abs Immature Granulocytes: 0.01 10*3/uL (ref 0.00–0.07)
Basophils Absolute: 0.1 10*3/uL (ref 0.0–0.1)
Basophils Relative: 1 %
Eosinophils Absolute: 0.1 10*3/uL (ref 0.0–0.5)
Eosinophils Relative: 2 %
HCT: 33.2 % — ABNORMAL LOW (ref 39.0–52.0)
Hemoglobin: 11.4 g/dL — ABNORMAL LOW (ref 13.0–17.0)
Immature Granulocytes: 0 %
Lymphocytes Relative: 29 %
Lymphs Abs: 1.6 10*3/uL (ref 0.7–4.0)
MCH: 28.6 pg (ref 26.0–34.0)
MCHC: 34.3 g/dL (ref 30.0–36.0)
MCV: 83.4 fL (ref 80.0–100.0)
Monocytes Absolute: 0.7 10*3/uL (ref 0.1–1.0)
Monocytes Relative: 13 %
Neutro Abs: 3.1 10*3/uL (ref 1.7–7.7)
Neutrophils Relative %: 55 %
Platelets: 277 10*3/uL (ref 150–400)
RBC: 3.98 MIL/uL — ABNORMAL LOW (ref 4.22–5.81)
RDW: 14.1 % (ref 11.5–15.5)
WBC: 5.7 10*3/uL (ref 4.0–10.5)
nRBC: 0 % (ref 0.0–0.2)

## 2023-09-13 LAB — CG4 I-STAT (LACTIC ACID): Lactic Acid, Venous: 0.9 mmol/L (ref 0.5–1.9)

## 2023-09-13 LAB — LIPID PANEL
Cholesterol: 194 mg/dL (ref 0–200)
HDL: 45 mg/dL (ref >40)
LDL Cholesterol: 139 mg/dL — ABNORMAL HIGH (ref 0–99)
Total CHOL/HDL Ratio: 4.3 ratio
Triglycerides: 48 mg/dL (ref <150)
VLDL: 10 mg/dL (ref 0–40)

## 2023-09-13 LAB — COMPREHENSIVE METABOLIC PANEL WITH GFR
ALT: 16 U/L (ref 0–44)
AST: 20 U/L (ref 15–41)
Albumin: 3.6 g/dL (ref 3.5–5.0)
Alkaline Phosphatase: 39 U/L (ref 38–126)
Anion gap: 10 (ref 5–15)
BUN: 15 mg/dL (ref 6–20)
CO2: 22 mmol/L (ref 22–32)
Calcium: 8.9 mg/dL (ref 8.9–10.3)
Chloride: 104 mmol/L (ref 98–111)
Creatinine, Ser: 0.94 mg/dL (ref 0.61–1.24)
GFR, Estimated: >60 mL/min (ref >60)
Glucose, Bld: 112 mg/dL — ABNORMAL HIGH (ref 70–99)
Potassium: 3.6 mmol/L (ref 3.5–5.1)
Sodium: 136 mmol/L (ref 135–145)
Total Bilirubin: 0.8 mg/dL (ref 0.0–1.2)
Total Protein: 6.9 g/dL (ref 6.5–8.1)

## 2023-09-13 LAB — HEMOGLOBIN A1C
Hgb A1c MFr Bld: 5.6 % (ref 4.8–5.6)
Mean Plasma Glucose: 114.02 mg/dL

## 2023-09-13 LAB — PROTIME-INR
INR: 3 — ABNORMAL HIGH (ref 0.8–1.2)
Prothrombin Time: 31 s — ABNORMAL HIGH (ref 11.4–15.2)

## 2023-09-13 LAB — APTT: aPTT: >200 s (ref 24–36)

## 2023-09-13 LAB — TROPONIN I (HIGH SENSITIVITY): Troponin I (High Sensitivity): 6 ng/L (ref <18)

## 2023-09-13 MED ORDER — HYDRALAZINE HCL 20 MG/ML IJ SOLN: 10.0000 mg | INTRAMUSCULAR | Status: AC | PRN

## 2023-09-13 MED ORDER — LABETALOL HCL 5 MG/ML IV SOLN: 10.0000 mg | INTRAVENOUS | Status: AC | PRN

## 2023-09-13 MED ORDER — SODIUM CHLORIDE 0.9% FLUSH
3.0000 mL | Freq: Two times a day (BID) | INTRAVENOUS | Status: DC
  Administered 2023-09-13 – 2023-09-14 (×2): 3 mL via INTRAVENOUS

## 2023-09-13 MED ORDER — VERAPAMIL HCL 2.5 MG/ML IV SOLN
INTRAVENOUS | Status: DC | PRN
  Administered 2023-09-13: 10 mL via INTRA_ARTERIAL

## 2023-09-13 MED ORDER — HEPARIN SODIUM (PORCINE) 1000 UNIT/ML IJ SOLN
INTRAMUSCULAR | Status: AC
  Filled 2023-09-13: qty 10

## 2023-09-13 MED ORDER — ONDANSETRON HCL 4 MG/2ML IJ SOLN
4.0000 mg | Freq: Four times a day (QID) | INTRAMUSCULAR | Status: DC | PRN
Start: 2023-09-13 — End: 2023-09-14

## 2023-09-13 MED ORDER — PANTOPRAZOLE SODIUM 40 MG PO TBEC
40.0000 mg | DELAYED_RELEASE_TABLET | Freq: Every day | ORAL | Status: DC
  Administered 2023-09-13 – 2023-09-14 (×2): 40 mg via ORAL
  Filled 2023-09-13 (×2): qty 1

## 2023-09-13 MED ORDER — LIDOCAINE HCL (PF) 1 % IJ SOLN
INTRAMUSCULAR | Status: DC | PRN
  Administered 2023-09-13: 5 mL via INTRADERMAL

## 2023-09-13 MED ORDER — HEPARIN SODIUM (PORCINE) 1000 UNIT/ML IJ SOLN
INTRAMUSCULAR | Status: DC | PRN
  Administered 2023-09-13: 5000 [IU] via INTRA_ARTERIAL

## 2023-09-13 MED ORDER — VERAPAMIL HCL 2.5 MG/ML IV SOLN
INTRAVENOUS | Status: AC
  Filled 2023-09-13: qty 2

## 2023-09-13 MED ORDER — SODIUM CHLORIDE 0.9 % IV SOLN: 250.0000 mL | INTRAVENOUS | Status: AC | PRN

## 2023-09-13 MED ORDER — FENTANYL CITRATE (PF) 100 MCG/2ML IJ SOLN
INTRAMUSCULAR | Status: AC
  Filled 2023-09-13: qty 2

## 2023-09-13 MED ORDER — FENTANYL CITRATE (PF) 100 MCG/2ML IJ SOLN
INTRAMUSCULAR | Status: DC | PRN
  Administered 2023-09-13: 25 ug via INTRAVENOUS

## 2023-09-13 MED ORDER — VERAPAMIL HCL 2.5 MG/ML IV SOLN: INTRAVENOUS | Status: DC | PRN

## 2023-09-13 MED ORDER — MIDAZOLAM HCL 2 MG/2ML IJ SOLN
INTRAMUSCULAR | Status: DC | PRN
  Administered 2023-09-13: 1 mg via INTRAVENOUS

## 2023-09-13 MED ORDER — MIDAZOLAM HCL 2 MG/2ML IJ SOLN
INTRAMUSCULAR | Status: AC
  Filled 2023-09-13: qty 2

## 2023-09-13 MED ORDER — LIDOCAINE HCL (PF) 1 % IJ SOLN
INTRAMUSCULAR | Status: AC
  Filled 2023-09-13: qty 30

## 2023-09-13 MED ORDER — SODIUM CHLORIDE 0.9% FLUSH: 3.0000 mL | INTRAVENOUS | Status: DC | PRN

## 2023-09-13 MED ORDER — ACETAMINOPHEN 325 MG PO TABS
650.0000 mg | ORAL_TABLET | ORAL | Status: DC | PRN
  Administered 2023-09-13: 650 mg via ORAL
  Filled 2023-09-13: qty 2

## 2023-09-13 MED ORDER — HEPARIN (PORCINE) IN NACL 1000-0.9 UT/500ML-% IV SOLN
INTRAVENOUS | Status: DC | PRN
  Administered 2023-09-13: 1000 mL

## 2023-09-13 MED ORDER — IOHEXOL 350 MG/ML SOLN
INTRAVENOUS | Status: DC | PRN
  Administered 2023-09-13: 75 mL

## 2023-09-13 NOTE — H&P (Signed)
 Cardiology Admission History and Physical   Patient ID: Donald [name] MRN: [id number]; DOB: 02/08/1971   Admission date: 09/13/2023  PCP:  Shade Flood, MD   [facility] Providers Cardiologist:  Charlton Haws, MD        Chief Complaint: Chest pain  Patient Profile:   Donald [name] is a 53 y.o. male with paroxysmal atrial fibrillation on Eliquis, hypertension, and hyperlipidemia who is being seen 09/13/2023 for the evaluation of chest pain.  History of Present Illness:   Donald [name] is a 53 year old male with the above listed medical problems who was recently seen in clinic.  He was doing well at that point in time.  Earlier today he developed palpitations and chest discomfort.  He characterized the chest discomfort as a burning sensation in his middle chest that radiated to his left shoulder.  Because of this he contacted emergency medical services.  An EKG demonstrated sinus tachycardia and a right bundle branch block.  There was some question as to whether his ST segments were a little bit more elevated in the precordial leads and lateral leads.  Due to ongoing chest pain and a diagnostic dilemma the patient was referred for emergency coronary angiography for the indication of unstable angina.  In terms of other symptoms the patient denies any severe bleeding or bruising, presyncope, syncope, palpitations, paroxysmal atrial dyspnea, orthopnea.   Past Medical History:  Diagnosis Date   Blood transfusion without reported diagnosis    Chronic bilateral low back pain without sciatica 08/19/2020   DEPRESSION, MILD    Dysuria    G E R D    Hypercholesterolemia    Hyperlipidemia    NOT TAKING MEDICATION   Hypertension    Lumbago    NEPHROLITHIASIS, HX OF    PARASOMNIA    Paroxysmal atrial fibrillation (HCC) 07/10/2022   Prediabetes 08/19/2020   Right bundle branch block     Past Surgical History:  Procedure Laterality Date   CARDIAC CATHETERIZATION N/A  01/19/2016   Procedure: Left Heart Cath and Coronary Angiography;  Surgeon: Tonny Bollman, MD;  Location[facility] INVASIVE CV LAB;  Service: Cardiovascular;  Laterality: N/A;   HAND SURGERY     right and left hand       Medications Prior to Admission: Prior to Admission medications   Medication Sig Start Date End Date Taking? Authorizing Provider  Cholecalciferol (VITAMIN D3) 50 MCG (2000 UT) TABS Take one tablet by mouth daily. 04/25/21   Shade Flood, MD  Diclofenac Sodium 1 % CREA Apply 4 g topically 4 (four) times daily as needed. 12/21/22   Shade Flood, MD  ELIQUIS 5 MG TABS tablet TAKE 1 TABLET BY MOUTH TWICE A DAY 04/16/23   Wendall Stade, MD  hydrochlorothiazide (HYDRODIURIL) 25 MG tablet TAKE 1 TABLET (25 MG TOTAL) BY MOUTH DAILY. 06/25/23   Shade Flood, MD  ipratropium (ATROVENT) 0.06 % nasal spray Place 1-2 sprays into both nostrils 4 (four) times daily. As needed for nasal congestion 10/09/22   Shade Flood, MD  isosorbide mononitrate (IMDUR) 30 MG 24 hr tablet Take 0.5 tablets (15 mg total) by mouth daily. Can take an additional half tab as needed for chest pain 03/26/23   Gaston Islam., NP  losartan (COZAAR) 100 MG tablet Take 1 tablet (100 mg total) by mouth daily. 10/09/22   Shade Flood, MD  pantoprazole (PROTONIX) 40 MG tablet TAKE 1 TABLET BY MOUTH EVERY DAY 07/16/23   Louanne Skye,  Devoria Albe., NP  pravastatin (PRAVACHOL) 20 MG tablet TAKE 1 TABLET BY MOUTH EVERY DAY 06/21/23   Shade Flood, MD  verapamil (CALAN) 40 MG tablet TAKE 1 TABLET (40 MG TOTAL) BY MOUTH DAILY AS NEEDED (BREAKTHROUGH CHEST PAIN). 04/24/23   Gaston Islam., NP  verapamil (CALAN-SR) 120 MG CR tablet TAKE 1 TABLET BY MOUTH AT BEDTIME. 06/21/23   Wendall Stade, MD     Allergies:    Allergies  Allergen Reactions   Ace Inhibitors Cough    Lisinopril caused cough   Pork-Derived Products     Per religious beliefs - consented to heparin products 09/13/23    Social History:    Social History   Socioeconomic History   Marital status: Married    Spouse name: Not on file   Number of children: 6   Years of education: 15   Highest education level: Not on file  Occupational History   Occupation: Location manager    Employer: [employer]  Tobacco Use   Smoking status: Former    Types: Cigarettes   Smokeless tobacco: Never   Tobacco comments:    Former smoker 09/07/23  Substance and Sexual Activity   Alcohol use: No    Alcohol/week: 0.0 standard drinks of alcohol   Drug use: No   Sexual activity: Not Currently  Other Topics Concern   Not on file  Social History Narrative   ** Merged History Encounter **       Social Drivers of Health   Financial Resource Strain: Not on file  Food Insecurity: No Food Insecurity (11/06/2022)   Hunger Vital Sign    Worried About Running Out of Food in the Last Year: Never true    Ran Out of Food in the Last Year: Never true  Transportation Needs: No Transportation Needs (11/06/2022)   PRAPARE - Administrator, Civil Service (Medical): No    Lack of Transportation (Non-Medical): No  Physical Activity: Not on file  Stress: Not on file  Social Connections: Unknown (10/18/2021)   Received from [facility], [facility]   Social Network    Social Network: Not on file  Intimate Partner Violence: Not At Risk (11/06/2022)   Humiliation, Afraid, Rape, and Kick questionnaire    Fear of Current or Ex-Partner: No    Emotionally Abused: No    Physically Abused: No    Sexually Abused: No    Family History:   The patient's family history includes Hypertension in his mother and sister.    ROS:  Please see the history of present illness.  All other ROS reviewed and negative.     Physical Exam/Data:   Vitals:   09/13/23 1419 09/13/23 1424 09/13/23 1429 09/13/23 1434  BP: 139/85 (!) 158/83 (!) 169/81 139/80  Pulse: 89 98 96 82  Resp: 11 10 15  (!) 26  SpO2: 98% 98% 99% 93%   No intake or output data in  the 24 hours ending 09/13/23 1440    09/07/2023    9:31 AM 05/14/2023    7:58 AM 04/23/2023    8:32 AM  Last 3 Weights  Weight (lbs) 240 lb 9.6 oz 246 lb 239 lb  Weight (kg) 109.135 kg 111.585 kg 108.41 kg     There is no height or weight on file to calculate BMI.  General:  Well nourished, well developed, in no acute distress HEENT: normal Neck: no JVD Vascular: No carotid bruits; Distal pulses 2+ bilaterally  Cardiac:  normal S1, S2; RRR; no murmur  Lungs:  clear to auscultation bilaterally, no wheezing, rhonchi or rales  Abd: soft, nontender, no hepatomegaly  Ext: no edema; warm and well-perfused Musculoskeletal:  No deformities, BUE and BLE strength normal and equal Skin: warm and dry  Neuro:  CNs 2-12 intact, no focal abnormalities noted Psych:  Normal affect    EKG:  The ECG that was done today was personally reviewed and demonstrates sinus rhythm with a right bundle branch block  Relevant CV Studies: PET stress test low risk October 2024  Monitor sinus rhythm, occasional SVT, rare PVCs and PACs February 2024  Echocardiogram EF 60 to 65%, mild LAE, trivial MR January 2024  Coronary angiography 40% ostial ramus disease August 2017   Laboratory Data:  High Sensitivity Troponin:  No results for input(s): "TROPONINIHS" in the last 720 hours.    ChemistryNo results for input(s): "NA", "K", "CL", "CO2", "GLUCOSE", "BUN", "CREATININE", "CALCIUM", "MG", "GFRNONAA", "GFRAA", "ANIONGAP" in the last 168 hours.  No results for input(s): "PROT", "ALBUMIN", "AST", "ALT", "ALKPHOS", "BILITOT" in the last 168 hours. Lipids No results for input(s): "CHOL", "TRIG", "HDL", "LABVLDL", "LDLCALC", "CHOLHDL" in the last 168 hours. HematologyNo results for input(s): "WBC", "RBC", "HGB", "HCT", "MCV", "MCH", "MCHC", "RDW", "PLT" in the last 168 hours. Thyroid No results for input(s): "TSH", "FREET4" in the last 168 hours. BNPNo results for input(s): "BNP", "PROBNP" in the last 168 hours.   DDimer No results for input(s): "DDIMER" in the last 168 hours.   Radiology/Studies:  No results found.   Assessment and Plan:   Unstable angina: The patient has a right bundle branch block and ongoing chest pain.  Due to this diagnostic dilemma we will refer the patient for emergency coronary angiography.  If PCI is pursued we will load the patient with Brilinta acutely and then convert to Plavix given Eliquis therapy. Paroxysmal atrial fibrillation: Will plan on continuing Eliquis 5 mg twice daily. Hypertension: Will optimize regimen following coronary angiography. Hyperlipidemia: Will plan on high-dose statin.   Risk Assessment/Risk Scores:    TIMI Risk Score for Unstable Angina or Non-ST Elevation MI:   The patient's TIMI risk score is 4, which indicates a 20% risk of all cause mortality, new or recurrent myocardial infarction or need for urgent revascularization in the next 14 days.    CHA2DS2-VASc Score = 2   This indicates a 2.2% annual risk of stroke. The patient's score is based upon: CHF History: 0 HTN History: 1 Diabetes History: 0 Stroke History: 0 Vascular Disease History: 1 Age Score: 0 Gender Score: 0     Code Status: Full Code  Severity of Illness: The appropriate patient status for this patient is INPATIENT. Inpatient status is judged to be reasonable and necessary in order to provide the required intensity of service to ensure the patient's safety. The patient's presenting symptoms, physical exam findings, and initial radiographic and laboratory data in the context of their chronic comorbidities is felt to place them at high risk for further clinical deterioration. Furthermore, it is not anticipated that the patient will be medically stable for discharge from the hospital within 2 midnights of admission.   * I certify that at the point of admission it is my clinical judgment that the patient will require inpatient hospital care spanning beyond 2 midnights  from the point of admission due to high intensity of service, high risk for further deterioration and high frequency of surveillance required.*   For questions or updates, please  contact [facility] Please consult [email or web address] for contact info under     Signed, Orbie Pyo, MD  09/13/2023 2:40 PM

## 2023-09-13 NOTE — Telephone Encounter (Signed)
Patient Product/process development scientist completed.    The patient is insured through [employer].     Ran test claim for Brilinta 90 mg and the current 30 day co-pay is $4.00.   This test claim was processed through [facility]- copay amounts may vary at other pharmacies due to pharmacy/plan contracts, or as the patient moves through the different stages of their insurance plan.     Roland Earl, CPHT Pharmacy Technician III Certified Patient Advocate [facility] Patient Advocate Team Direct Number: [phone]  Fax: [phone]

## 2023-09-13 NOTE — Progress Notes (Signed)
 Pt updated of radial site care & band removed. Site intact. RN also contacted Hilma Favors for trop & was advised to check one more troponin just to be sure since pt is still having lt shoulder aching post cath. RN will continue to monitor the pt. Sanda Linger, RN

## 2023-09-14 ENCOUNTER — Encounter (HOSPITAL_COMMUNITY): Payer: Self-pay | Admitting: Internal Medicine

## 2023-09-14 ENCOUNTER — Other Ambulatory Visit (HOSPITAL_COMMUNITY): Payer: Self-pay

## 2023-09-14 ENCOUNTER — Other Ambulatory Visit: Payer: Self-pay

## 2023-09-14 ENCOUNTER — Inpatient Hospital Stay (HOSPITAL_COMMUNITY)

## 2023-09-14 DIAGNOSIS — I25110 Atherosclerotic heart disease of native coronary artery with unstable angina pectoris: Secondary | ICD-10-CM | POA: Diagnosis not present

## 2023-09-14 DIAGNOSIS — I2510 Atherosclerotic heart disease of native coronary artery without angina pectoris: Secondary | ICD-10-CM | POA: Diagnosis not present

## 2023-09-14 LAB — BASIC METABOLIC PANEL WITH GFR
Anion gap: 8 (ref 5–15)
BUN: 13 mg/dL (ref 6–20)
CO2: 26 mmol/L (ref 22–32)
Calcium: 8.9 mg/dL (ref 8.9–10.3)
Chloride: 104 mmol/L (ref 98–111)
Creatinine, Ser: 0.95 mg/dL (ref 0.61–1.24)
GFR, Estimated: >60 mL/min (ref >60)
Glucose, Bld: 98 mg/dL (ref 70–99)
Potassium: 3.6 mmol/L (ref 3.5–5.1)
Sodium: 138 mmol/L (ref 135–145)

## 2023-09-14 LAB — ECHOCARDIOGRAM COMPLETE
AR max vel: 2.99 cm2
AV Peak grad: 10.9 mmHg
Ao pk vel: 1.65 m/s
Area-P 1/2: 3.77 cm2
Height: 74 in
S' Lateral: 2.7 cm
Weight: 3758.4 [oz_av]

## 2023-09-14 MED ORDER — VERAPAMIL HCL ER 120 MG PO TBCR
120.0000 mg | EXTENDED_RELEASE_TABLET | Freq: Every day | ORAL | Status: DC
  Administered 2023-09-14: 120 mg via ORAL
  Filled 2023-09-14: qty 1

## 2023-09-14 MED ORDER — PANTOPRAZOLE SODIUM 40 MG PO TBEC: 40.0000 mg | DELAYED_RELEASE_TABLET | Freq: Every day | ORAL | Status: AC | PRN

## 2023-09-14 MED ORDER — ORAL CARE MOUTH RINSE: 15.0000 mL | OROMUCOSAL | Status: DC | PRN

## 2023-09-14 MED ORDER — ROSUVASTATIN CALCIUM 20 MG PO TABS
20.0000 mg | ORAL_TABLET | Freq: Every day | ORAL | 1 refills | Status: AC
  Filled 2023-09-14: qty 90, 90d supply, fill #0

## 2023-09-14 MED ORDER — ROSUVASTATIN CALCIUM 20 MG PO TABS
20.0000 mg | ORAL_TABLET | Freq: Every day | ORAL | Status: DC
  Administered 2023-09-14: 20 mg via ORAL
  Filled 2023-09-14: qty 1

## 2023-09-14 MED ORDER — LOSARTAN POTASSIUM 50 MG PO TABS
100.0000 mg | ORAL_TABLET | Freq: Every day | ORAL | Status: DC
  Administered 2023-09-14: 100 mg via ORAL
  Filled 2023-09-14: qty 2

## 2023-09-14 MED ORDER — APIXABAN 5 MG PO TABS
5.0000 mg | ORAL_TABLET | Freq: Two times a day (BID) | ORAL | Status: DC
Start: 2023-09-14 — End: 2023-09-14
  Administered 2023-09-14: 5 mg via ORAL
  Filled 2023-09-14: qty 1

## 2023-09-14 NOTE — Progress Notes (Signed)
 Echocardiogram 2D Echocardiogram has been performed.  Lucendia Herrlich 09/14/2023, 11:19 AM

## 2023-09-14 NOTE — Discharge Summary (Signed)
 Discharge Summary    Patient ID: Donald [name] MRN: [id number]; DOB: 18-Jul-1970  Admit date: 09/13/2023 Discharge date: 09/14/2023  PCP:  Shade Flood, MD   [facility] Providers Cardiologist:  Charlton Haws, MD     Discharge Diagnoses    Principal Problem:   Unstable angina [facility]) Active Problems:   HLD (hyperlipidemia)   Paroxysmal atrial fibrillation [facility])   Diagnostic Studies/Procedures    Cath: 09/13/2023  1.  Mild, nonobstructive disease with no acute occlusions. 2.  LVEDP of 15 mmHg with preserved LV function with no evidence of Takotsubo cardiomyopathy.   Recommendation: Echocardiogram and follow-up serial troponin; likely discharge tomorrow.  Echo: 09/14/2023  IMPRESSIONS     1. Left ventricular ejection fraction, by estimation, is 70 to 75%. The  left ventricle has hyperdynamic function. The left ventricle has no  regional wall motion abnormalities. There is mild concentric left  ventricular hypertrophy. Left ventricular  diastolic parameters were normal.   2. Right ventricular systolic function is normal. The right ventricular  size is normal.   3. The mitral valve is normal in structure. Trivial mitral valve  regurgitation. No evidence of mitral stenosis.   4. The aortic valve is normal in structure. Aortic valve regurgitation is  not visualized. No aortic stenosis is present.   5. Aortic dilatation noted. There is borderline dilatation of the aortic  root, measuring 39 mm.   6. The inferior vena cava is normal in size with greater than 50%  respiratory variability, suggesting right atrial pressure of 3 mmHg.   FINDINGS   Left Ventricle: Left ventricular ejection fraction, by estimation, is 70  to 75%. The left ventricle has hyperdynamic function. The left ventricle  has no regional wall motion abnormalities. The left ventricular internal  cavity size was normal in size.  There is mild concentric left ventricular hypertrophy. Left  ventricular  diastolic parameters were normal.   Right Ventricle: The right ventricular size is normal. No increase in  right ventricular wall thickness. Right ventricular systolic function is  normal.   Left Atrium: Left atrial size was normal in size.   Right Atrium: Right atrial size was normal in size.   Pericardium: There is no evidence of pericardial effusion.   Mitral Valve: The mitral valve is normal in structure. Trivial mitral  valve regurgitation. No evidence of mitral valve stenosis.   Tricuspid Valve: The tricuspid valve is normal in structure. Tricuspid  valve regurgitation is trivial. No evidence of tricuspid stenosis.   Aortic Valve: The aortic valve is normal in structure. Aortic valve  regurgitation is not visualized. No aortic stenosis is present. Aortic  valve peak gradient measures 10.9 mmHg.   Pulmonic Valve: The pulmonic valve was normal in structure. Pulmonic valve  regurgitation is not visualized. No evidence of pulmonic stenosis.   Aorta: Aortic dilatation noted. There is borderline dilatation of the  aortic root, measuring 39 mm.   Venous: The inferior vena cava is normal in size with greater than 50%  respiratory variability, suggesting right atrial pressure of 3 mmHg.   IAS/Shunts: No atrial level shunt detected by color flow Doppler.   _____________   History of Present Illness     Donald [name] is a 53 y.o. male with paroxysmal atrial fibrillation on Eliquis, hypertension, and hyperlipidemia who was seen 09/13/2023 for the evaluation of chest pain. He was doing well at that point in time. Earlier today he developed palpitations and chest discomfort. He characterized the chest discomfort as a  burning sensation in his middle chest that radiated to his left shoulder. Because of this he contacted emergency medical services. An EKG demonstrated sinus tachycardia and a right bundle branch block. There was some question as to whether his ST segments  were a little bit more elevated in the precordial leads and lateral leads. Due to ongoing chest pain the patient was referred for emergency coronary angiography for the indication of unstable angina.   Hospital Course   Chest pain Shoulder pain -- initially presented with concerns for unstable angina with RBBB on EKG. He was taken for urgent cardiac cath mild, nonobstructive with culprit lesion noted.  -- hsTn 6 -- reports some mild left shoulder pain today, worse with movement -- echo 4/11 hyperdynamic LV function with EF 70 to 75% without wall motion abnormalities    Paroxysmal atrial fibrillation -- remains in sinus rhythm -- resumed Eliquis 5mg   BID   HTN -- controlled -- resume home losartan 100mg  daily, hydrochlorothiazide and verapamil 120mg  daily    HLD -- LDL 139, HDL 45 -- had myalgias with atorvastatin, switch to Crestor 40mg  daily  -- will need FLP/LFTs in 8 weeks  Patient was seen by Dr. Tresa Endo and deemed stable for discharge home. Follow up arranged in the office.  _____________  Discharge Vitals Blood pressure (!) 147/87, pulse 72, temperature 98 F (36.7 C), temperature source Oral, resp. rate 20, height 6\' 2"  (1.88 m), weight 106.5 kg, SpO2 97%.  Filed Weights   09/13/23 1747  Weight: 106.5 kg    Labs & Radiologic Studies    CBC Recent Labs    09/13/23 1410  WBC 5.7  NEUTROABS 3.1  HGB 11.4*  HCT 33.2*  MCV 83.4  PLT 277   Basic Metabolic Panel Recent Labs    14/78/29 1540 09/14/23 0531  NA 136 138  K 3.6 3.6  CL 104 104  CO2 22 26  GLUCOSE 112* 98  BUN 15 13  CREATININE 0.94 0.95  CALCIUM 8.9 8.9   Liver Function Tests Recent Labs    09/13/23 1540  AST 20  ALT 16  ALKPHOS 39  BILITOT 0.8  PROT 6.9  ALBUMIN 3.6   No results for input(s): "LIPASE", "AMYLASE" in the last 72 hours. High Sensitivity Troponin:   Recent Labs  Lab 09/13/23 1540  TROPONINIHS 6    BNP Invalid input(s): "POCBNP" D-Dimer No results for input(s):  "DDIMER" in the last 72 hours. Hemoglobin A1C Recent Labs    09/13/23 1430  HGBA1C 5.6   Fasting Lipid Panel Recent Labs    09/13/23 1540  CHOL 194  HDL 45  LDLCALC 139*  TRIG 48  CHOLHDL 4.3   Thyroid Function Tests No results for input(s): "TSH", "T4TOTAL", "T3FREE", "THYROIDAB" in the last 72 hours.  Invalid input(s): "FREET3" _____________  ECHOCARDIOGRAM COMPLETE Result Date: 09/14/2023    ECHOCARDIOGRAM REPORT   Patient Name:   [facility] Date of Exam: 09/14/2023 Medical Rec #:  [id number]         Height:       74.0 in Accession #:    [id number]        Weight:       234.9 lb Date of Birth:  11-10-1970         BSA:          2.327 m Patient Age:    53 years          BP:  129/85 mmHg Patient Gender: M                 HR:           88 bpm. Exam Location:  Inpatient Procedure: 2D Echo, Cardiac Doppler and Color Doppler (Both Spectral and Color            Flow Doppler were utilized during procedure). 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 Angina and CAD, Arrythmias:Atrial Fibrillation                 and RBBB, Signs/Symptoms:Chest Pain; Risk Factors:Hypertension                 and Dyslipidemia.  Sonographer:    Lucendia Herrlich RCS Referring Phys: [id number] Orbie Pyo IMPRESSIONS  1. Left ventricular ejection fraction, by estimation, is 70 to 75%. The left ventricle has hyperdynamic function. The left ventricle has no regional wall motion abnormalities. There is mild concentric left ventricular hypertrophy. Left ventricular diastolic parameters were normal.  2. Right ventricular systolic function is normal. The right ventricular size is normal.  3. The mitral valve is normal in structure. Trivial mitral valve regurgitation. No evidence of mitral stenosis.  4. The aortic valve is normal in structure. Aortic valve regurgitation is not visualized. No aortic  stenosis is present.  5. Aortic dilatation noted. There is borderline dilatation of the aortic root, measuring 39 mm.  6. The inferior vena cava is normal in size with greater than 50% respiratory variability, suggesting right atrial pressure of 3 mmHg. FINDINGS  Left Ventricle: Left ventricular ejection fraction, by estimation, is 70 to 75%. The left ventricle has hyperdynamic function. The left ventricle has no regional wall motion abnormalities. The left ventricular internal cavity size was normal in size. There is mild concentric left ventricular hypertrophy. Left ventricular diastolic parameters were normal. Right Ventricle: The right ventricular size is normal. No increase in right ventricular wall thickness. Right ventricular systolic function is normal. Left Atrium: Left atrial size was normal in size. Right Atrium: Right atrial size was normal in size. Pericardium: There is no evidence of pericardial effusion. Mitral Valve: The mitral valve is normal in structure. Trivial mitral valve regurgitation. No evidence of mitral valve stenosis. Tricuspid Valve: The tricuspid valve is normal in structure. Tricuspid valve regurgitation is trivial. No evidence of tricuspid stenosis. Aortic Valve: The aortic valve is normal in structure. Aortic valve regurgitation is not visualized. No aortic stenosis is present. Aortic valve peak gradient measures 10.9 mmHg. Pulmonic Valve: The pulmonic valve was normal in structure. Pulmonic valve regurgitation is not visualized. No evidence of pulmonic stenosis. Aorta: Aortic dilatation noted. There is borderline dilatation of the aortic root, measuring 39 mm. Venous: The inferior vena cava is normal in size with greater than 50% respiratory variability, suggesting right atrial pressure of 3 mmHg. IAS/Shunts: No atrial level shunt detected by color flow Doppler.  LEFT VENTRICLE PLAX 2D LVIDd:         4.70 cm   Diastology LVIDs:         2.70 cm   LV e' medial:    11.30 cm/s LV PW:          1.10 cm   LV E/e' medial:  6.6 LV IVS:  1.20 cm   LV e' lateral:   12.90 cm/s LVOT diam:     2.40 cm   LV E/e' lateral: 5.8 LV SV:         84 LV SV Index:   36 LVOT Area:     4.52 cm  RIGHT VENTRICLE             IVC RV S prime:     15.80 cm/s  IVC diam: 1.40 cm TAPSE (M-mode): 2.4 cm LEFT ATRIUM             Index        RIGHT ATRIUM           Index LA diam:        3.90 cm 1.68 cm/m   RA Area:     16.75 cm LA Vol (A2C):   47.6 ml 20.43 ml/m  RA Volume:   45.35 ml  19.49 ml/m LA Vol (A4C):   36.6 ml 15.71 ml/m LA Biplane Vol: 36.2 ml 15.55 ml/m  AORTIC VALVE AV Area (Vmax): 2.99 cm AV Vmax:        165.00 cm/s AV Peak Grad:   10.9 mmHg LVOT Vmax:      109.00 cm/s LVOT Vmean:     65.000 cm/s LVOT VTI:       0.185 m  AORTA Ao Root diam: 3.90 cm Ao Asc diam:  3.30 cm MITRAL VALVE MV Area (PHT): 3.77 cm    SHUNTS MV Decel Time: 201 msec    Systemic VTI:  0.18 m MV E velocity: 75.10 cm/s  Systemic Diam: 2.40 cm MV A velocity: 71.00 cm/s MV E/A ratio:  1.06 Arvilla Meres MD Electronically signed by Arvilla Meres MD Signature Date/Time: 09/14/2023/11:23:02 AM    Final    CARDIAC CATHETERIZATION Result Date: 09/13/2023 1.  Mild, nonobstructive disease with no acute occlusions. 2.  LVEDP of 15 mmHg with preserved LV function with no evidence of Takotsubo cardiomyopathy. Recommendation: Echocardiogram and follow-up serial troponin; likely discharge tomorrow.   Disposition   Pt is being discharged home today in good condition.  Follow-up Plans & Appointments        Discharge Medications   Allergies as of 09/14/2023       Reactions   Atorvastatin Other (See Comments)   Muscle pain   Zestril [lisinopril] Cough   Ace Inhibitors Cough   Pork-derived Products Other (See Comments)   Per religious beliefs - consented to heparin products 09/13/23        Medication List     STOP taking these medications    pravastatin 20 MG tablet Commonly known as: PRAVACHOL       TAKE  these medications    acetaminophen 500 MG tablet Commonly known as: TYLENOL Take 500 mg by mouth daily as needed for moderate pain (pain score 4-6) or headache.   b complex vitamins capsule Take 1 capsule by mouth daily.   Eliquis 5 MG Tabs tablet Generic drug: apixaban TAKE 1 TABLET BY MOUTH TWICE A DAY   hydrochlorothiazide 25 MG tablet Commonly known as: HYDRODIURIL TAKE 1 TABLET (25 MG TOTAL) BY MOUTH DAILY.   isosorbide mononitrate 30 MG 24 hr tablet Commonly known as: IMDUR Take 0.5 tablets (15 mg total) by mouth daily. Can take an additional half tab as needed for chest pain What changed:  how much to take when to take this reasons to take this additional instructions   losartan 100 MG tablet Commonly known as: COZAAR Take  1 tablet (100 mg total) by mouth daily.   MAGNESIUM PO Take 1 tablet by mouth daily.   pantoprazole 40 MG tablet Commonly known as: PROTONIX Take 1 tablet (40 mg total) by mouth daily as needed (heartburn).   POTASSIUM PO Take 1 tablet by mouth daily. OTC   rosuvastatin 20 MG tablet Commonly known as: Crestor Take 1 tablet (20 mg total) by mouth daily.   verapamil 40 MG tablet Commonly known as: CALAN TAKE 1 TABLET (40 MG TOTAL) BY MOUTH DAILY AS NEEDED (BREAKTHROUGH CHEST PAIN).   verapamil 120 MG CR tablet Commonly known as: CALAN-SR TAKE 1 TABLET BY MOUTH AT BEDTIME.   VITAMIN B-12 PO Take 1 tablet by mouth daily.   VITAMIN D-3 PO Take 1 tablet by mouth daily.   ZINC PO Take 1 tablet by mouth daily.         Outstanding Labs/Studies   FLP/LFTs in 8 weeks  Duration of Discharge Encounter: APP Time: 20 minutes   Signed, Laverda Page, NP 09/14/2023, 1:24 PM

## 2023-09-14 NOTE — Plan of Care (Signed)
 AAOx4, calm, cooperative, ambulatory, on RA, denies pain, SOB, dizziness, lightheadedness. Pt received discharge instructions and verbalized understanding. Pt received his prescribed medication from the pharmacy. Tele is d/c and telemetry notified. PIV is removed, site is clean and intact. Pt is going home with his wife.  Problem: Education Goal: Knowledge of General Education information will improve Description: Including pain rating scale, medication(s)/side effects and non-pharmacologic comfort measures Outcome: Adequate for Discharge   Problem: Health Behavior/Discharge Planning: Goal: Ability to manage health-related needs will improve Outcome: Adequate for Discharge   Problem: Clinical Measurements: Goal: Ability to maintain clinical measurements within normal limits will improve Outcome: Adequate for Discharge Goal: Will remain free from infection Outcome: Adequate for Discharge Goal: Diagnostic test results will improve Outcome: Adequate for Discharge Goal: Respiratory complications will improve Outcome: Adequate for Discharge Goal: Cardiovascular complication will be avoided Outcome: Adequate for Discharge   Problem: Activity: Goal: Risk for activity intolerance will decrease Outcome: Adequate for Discharge   Problem: Nutrition: Goal: Adequate nutrition will be maintained Outcome: Adequate for Discharge   Problem: Coping: Goal: Level of anxiety will decrease Outcome: Adequate for Discharge   Problem: Elimination: Goal: Will not experience complications related to bowel motility Outcome: Adequate for Discharge Goal: Will not experience complications related to urinary retention Outcome: Adequate for Discharge   Problem: Pain Managment: Goal: General experience of comfort will improve and/or be controlled Outcome: Adequate for Discharge   Problem: Safety: Goal: Ability to remain free from injury will improve Outcome: Adequate for Discharge   Problem: Skin  Integrity: Goal: Risk for impaired skin integrity will decrease Outcome: Adequate for Discharge   Problem: Education: Goal: Understanding of CV disease, CV risk reduction, and recovery process will improve Outcome: Adequate for Discharge Goal: Individualized Educational Video(s) Outcome: Adequate for Discharge   Problem: Activity: Goal: Ability to return to baseline activity level will improve Outcome: Adequate for Discharge   Problem: Cardiovascular: Goal: Ability to achieve and maintain adequate cardiovascular perfusion will improve Outcome: Adequate for Discharge Goal: Vascular access site(s) Level 0-1 will be maintained Outcome: Adequate for Discharge   Problem: Health Behavior/Discharge Planning: Goal: Ability to safely manage health-related needs after discharge will improve Outcome: Adequate for Discharge

## 2023-09-14 NOTE — TOC Transition Note (Signed)
 Transition of Care [facility]) - Discharge Note   Patient Details  Name: Donald [name] MRN: [id number] Date of Birth: 01-09-1971  Transition of Care [facility]) CM/SW Contact:  Tom-Johnson, Hershal Coria, RN Phone Number: 09/14/2023, 3:56 PM   Clinical Narrative:     Patient is scheduled for discharge today.  Readmission Risk Assessment done. Outpatient f/u, hospital f/u and discharge instructions on AVS. Prescriptions sent to [facility] pharmacy and patient will receive meds prior discharge. No TOC needs or recommendations noted. Family to transport at discharge.  No further TOC needs noted.      Final next level of care: Home/Self Care Barriers to Discharge: Barriers Resolved   Patient Goals and CMS Choice Patient states their goals for this hospitalization and ongoing recovery are:: To return home CMS Medicare.gov Compare Post Acute Care list provided to:: Patient Choice offered to / list presented to : NA      Discharge Placement                Patient to be transferred to facility by: Family      Discharge Plan and Services Additional resources added to the After Visit Summary for                  DME Arranged: N/A DME Agency: NA       HH Arranged: NA HH Agency: NA        Social Drivers of Health (SDOH) Interventions SDOH Screenings   Food Insecurity: No Food Insecurity (09/14/2023)  Housing: Low Risk  (09/14/2023)  Transportation Needs: No Transportation Needs (09/14/2023)  Utilities: Not At Risk (09/14/2023)  Depression (PHQ2-9): Low Risk  (04/23/2023)  Social Connections: Unknown (10/18/2021)   [facility]  Tobacco Use: Medium Risk (09/14/2023)     Readmission Risk Interventions    09/14/2023    3:51 PM  Readmission Risk Prevention Plan  Post Dischage Appt Complete  Medication Screening Complete  Transportation Screening Complete

## 2023-09-14 NOTE — Progress Notes (Addendum)
   Patient Name: Donald [name] Date of Encounter: 09/14/2023 [facility] Cardiologist: Charlton Haws, MD   Interval Summary  .    Still with some mild left shoulder pain this morning, worse with movement.   Vital Signs .    Vitals:   09/13/23 1735 09/13/23 1747 09/13/23 2005 09/14/23 0400  BP: 134/85  115/70 129/85  Pulse: 77  70 64  Resp:   16 18  Temp: 98.5 F (36.9 C)  98.3 F (36.8 C) 98 F (36.7 C)  TempSrc: Oral  Oral Oral  SpO2: 99%  99% 96%  Weight:  106.5 kg    Height:  6\' 2"  (1.88 m)     No intake or output data in the 24 hours ending 09/14/23 0819    09/13/2023    5:47 PM 09/07/2023    9:31 AM 05/14/2023    7:58 AM  Last 3 Weights  Weight (lbs) 234 lb 14.4 oz 240 lb 9.6 oz 246 lb  Weight (kg) 106.55 kg 109.135 kg 111.585 kg      Telemetry/ECG    Sinus Rhythm - Personally Reviewed  Physical Exam .    GEN: No acute distress.   Neck: No JVD Cardiac: RRR, no murmurs, rubs, or gallops.  Respiratory: Clear to auscultation bilaterally. GI: Soft, nontender, non-distended  MS: No edema  Assessment & Plan .     53 y.o. male with paroxysmal atrial fibrillation on Eliquis, hypertension, and hyperlipidemia who was seen 09/13/2023 for the evaluation of chest pain.   Chest pain Shoulder pain -- initially presented with concerns for unstable angina with RBBB on EKG. He was taken for urgent cardiac cath mild, nonobstructive with culprit lesion noted.  -- hsTn 6 -- reports some mild left shoulder pain today, worse with movement -- echo pending  Paroxysmal atrial fibrillation -- remains in sinus rhythm -- resume Eliquis 5mg   BID  HTN -- controlled -- resume home losartan 100mg  daily and verapamil 120mg  daily   HLD -- LDL 139, HDL 45 -- had myalgias with atorvastatin, switch to Crestor 40mg  daily   Anticipate DC pending echo results  For questions or updates, please contact [facility] Please consult [email or web address] for contact info  under       Signed, Laverda Page, NP    Patient seen and examined. Agree with assessment and plan.  Catheterization performed yesterday showed mild nonobstructive disease without acute occlusions.  LVEDP was 15 mm.  There was no evidence for Takotsubo cardiomyopathy.  2D echo Doppler study today and straits hyperdynamic LV function with EF 70 to 75% without wall motion abnormalities.  There is mild concentric LVH.  Valves are essentially normal.  There is borderline dilatation of the aortic root measuring 39 mm.  Blood pressure stable at 128/84.  Pulse regular without ectopy.  No JVD.  Lungs clear.  Rhythm regular without S3 gallop.  Abdomen nontender.  No edema.  Right radial cath site stable.  Pending sinus rhythm.  Patient okay for discharge today.   Lennette Bihari, MD, [facility] 09/14/2023 11:48 AM

## 2023-09-17 LAB — LIPOPROTEIN A (LPA): Lipoprotein (a): 105.1 nmol/L — ABNORMAL HIGH (ref <75.0)

## 2023-09-18 ENCOUNTER — Other Ambulatory Visit: Payer: Self-pay | Admitting: Family Medicine

## 2023-09-18 DIAGNOSIS — I10 Essential (primary) hypertension: Secondary | ICD-10-CM

## 2023-09-26 NOTE — Progress Notes (Unsigned)
 Cardiology Office Note    Patient Name: Donald [name] Date of Encounter: 09/27/2023  Primary Care Provider:  Benjiman Bras, MD Primary Cardiologist:  Janelle Mediate, MD Primary Electrophysiologist: None   Past Medical History    Past Medical History:  Diagnosis Date   Blood transfusion without reported diagnosis    Chronic bilateral low back pain without sciatica 08/19/2020   DEPRESSION, MILD    Dysuria    G E R D    Hypercholesterolemia    Hyperlipidemia    NOT TAKING MEDICATION   Hypertension    Lumbago    NEPHROLITHIASIS, HX OF    PARASOMNIA    Paroxysmal atrial fibrillation (HCC) 07/10/2022   Prediabetes 08/19/2020   Right bundle branch block     History of Present Illness  Donald [name] is a 53 year old male with a PMH of nonobstructive CAD, paroxysmal AF, RBBB, SVT, HTN, HLD, left kidney hemorrhage who presents today for posthospital follow-up.  Donald [name] was last seen in office on 03/26/2023 for follow-up of chest pain.  He was found to have orthostatic blood pressures and Imdur  was decreased to 15 mg and patient underwent a PET/CT for further evaluation that showed no evidence of ischemia and was low risk.  Noted to have elevations in heart rate and patient was given as needed dose of 40 mg of verapamil  for elevated HR.  He was seen by Dr. Stann Earnest on 05/14/2023 for follow-up and was continued on current regimen.  He contacted our office on 09/05/23 with complaint of palpitations and was advised to follow-up with AF clinic and was given a 30-day event monitor for further evaluation of AF.  He was seen in the ED via EMS on 09/13/23 with complaint of chest pain.  He developed sudden onset of palpitations and chest discomfort that he characterized as burning in the middle of the chest and radiating to left shoulder.  EKG showed sinus tach with RBBB and ST segment elevations in precordial leads.  He underwent left heart cath for further evaluation that showed mild  nonobstructive disease with no acute occlusions.  TTE was completed showing hyperdynamic EF of 70-75% with no RWMA and mild concentric LVH normal LA/RA with no significant valve abnormalities.  Donald [name] presents today for post left heart catheterization follow-up.  He reports since his procedure that he is not experiencing any severe chest pain. His chest pain is described as occurring under his shoulder and sometimes when walking, but not consistently. The pain can feel like a 'tight in my chest' and sometimes occurs without exertion. He has tried to determine if the pain is related to physical activity, such as walking or running, but finds it inconsistent.  His heart rate sometimes feels fast, and he occasionally feels his heart 'skipping' beats. He has not taken additional doses of verapamil  and sometimes finds his heart rate to be low, around fifty, when relaxed.  He was scheduled to wear a 30-day event monitor but did not receive it from the AF clinic.  We will have our device coordinator send a new monitor to his address.His blood pressure at home is often elevated, with readings around 165/80, and he is currently taking losartan . He has not been using nitroglycerin  at home. He works twelve-hour shifts, three days a week, and sometimes experiences swelling in his legs when standing or sitting for long periods. Patient denies dyspnea, PND, orthopnea, nausea, vomiting, dizziness, syncope, edema, weight gain, or early satiety.   Discussed the use  of AI scribe software for clinical note transcription with the patient, who gave verbal consent to proceed.  History of Present Illness   Review of Systems  Please see the history of present illness.    All other systems reviewed and are otherwise negative except as noted above.  Physical Exam    Wt Readings from Last 3 Encounters:  09/27/23 238 lb 3.2 oz (108 kg)  09/13/23 234 lb 14.4 oz (106.5 kg)  09/07/23 240 lb 9.6 oz (109.1 kg)    VS: Vitals:   09/27/23 0852 09/27/23 0855  BP: (!) 150/88 (!) 170/80  Pulse:  92  SpO2:  98%  ,Body mass index is 30.58 kg/m. GEN: Well nourished, well developed in no acute distress Neck: No JVD; No carotid bruits Pulmonary: Clear to auscultation without rales, wheezing or rhonchi  Cardiovascular: Normal rate. Regular rhythm. Normal S1. Normal S2.   Murmurs: There is no murmur.  ABDOMEN: Soft, non-tender, non-distended EXTREMITIES:  No edema; No deformity   EKG/LABS/ Recent Cardiac Studies   ECG personally reviewed by me today -none completed today  Risk Assessment/Calculations:    CHA2DS2-VASc Score = 2   This indicates a 2.2% annual risk of stroke. The patient's score is based upon: CHF History: 0 HTN History: 1 Diabetes History: 0 Stroke History: 0 Vascular Disease History: 1 Age Score: 0 Gender Score: 0     STOP-Bang Score:  6      Lab Results  Component Value Date   WBC 5.7 09/13/2023   HGB 11.4 (L) 09/13/2023   HCT 33.2 (L) 09/13/2023   MCV 83.4 09/13/2023   PLT 277 09/13/2023   Lab Results  Component Value Date   CREATININE 0.95 09/14/2023   BUN 13 09/14/2023   NA 138 09/14/2023   K 3.6 09/14/2023   CL 104 09/14/2023   CO2 26 09/14/2023   Lab Results  Component Value Date   CHOL 194 09/13/2023   HDL 45 09/13/2023   LDLCALC 139 (H) 09/13/2023   TRIG 48 09/13/2023   CHOLHDL 4.3 09/13/2023    Lab Results  Component Value Date   HGBA1C 5.6 09/13/2023   Assessment & Plan    1.  Nonobstructive CAD: -Patient presented to ED on/10/25 with unstable angina and emergent LHC performed showing mild nonobstructive CAD with no acute occlusions -Intermittent, possibly musculoskeletal. Cardiac tests show no ischemia or obstructions. Low microvascular disease risk. - Prescribe nitroglycerin  for as-needed use. - Instruct on nitroglycerin : one tablet sublingually every five minutes, up to three doses; seek emergency care if pain persists. - Refer to  orthopedics for musculoskeletal evaluation. - Contact Doctor Green's office for referral.  2.  Paroxysmal AF: - Patient reported complaints of intermittent palpitations and seen in AF clinic on 09/07/2023 and sinus rhythm.  Patient provided 30-day event monitor -Eliquis  5 mg twice daily - Patient is rate controlled today with no complaints of palpitations. - Patient had 30-day event monitor ordered however never received. - Order 30-day event monitor. - Instruct to return monitor after 30 days for analysis. - Continue verapamil  120 mg daily with as needed 40 mg for breakthrough palpitations.  3.  Essential hypertension: -HYPERTENSION CONTROL Vitals:   09/27/23 0852 09/27/23 0855  BP: (!) 150/88 (!) 170/80    The patient's blood pressure is elevated above target today.  In order to address the patient's elevated BP: Blood pressure will be monitored at home to determine if medication changes need to be made.; A current anti-hypertensive medication was adjusted  today.; Follow up with general cardiology has been recommended.   - We will discontinue losartan  100 mg and start valsartan  160 mg daily - Patient will monitor BP and contact office with readings after 2 weeks. - Continue HCTZ 25 mg daily  4.  Hyperlipidemia: - Patient's last LDL cholesterol was 139 -Previous LP(a) elevated - Patient advised to adjust dietary consumption of saturated fat - We may change Crestor  to atorvastatin  and refer to Pharm.D.  Disposition: Follow-up with Janelle Mediate, MD or APP in 6 months   Signed, Francene Ing, Retha Cast, NP 09/27/2023, 10:43 AM [facility]

## 2023-09-27 ENCOUNTER — Encounter: Payer: Self-pay | Admitting: Nurse Practitioner

## 2023-09-27 ENCOUNTER — Ambulatory Visit: Attending: Nurse Practitioner | Admitting: Nurse Practitioner

## 2023-09-27 VITALS — BP 170/80 | HR 92 | Ht 74.0 in | Wt 238.2 lb

## 2023-09-27 DIAGNOSIS — I480 Paroxysmal atrial fibrillation: Secondary | ICD-10-CM | POA: Diagnosis not present

## 2023-09-27 DIAGNOSIS — E782 Mixed hyperlipidemia: Secondary | ICD-10-CM

## 2023-09-27 DIAGNOSIS — R079 Chest pain, unspecified: Secondary | ICD-10-CM

## 2023-09-27 DIAGNOSIS — I2510 Atherosclerotic heart disease of native coronary artery without angina pectoris: Secondary | ICD-10-CM | POA: Diagnosis not present

## 2023-09-27 DIAGNOSIS — I10 Essential (primary) hypertension: Secondary | ICD-10-CM | POA: Diagnosis not present

## 2023-09-27 MED ORDER — VALSARTAN 160 MG PO TABS: 160.0000 mg | ORAL_TABLET | Freq: Every day | ORAL | 1 refills | Status: DC

## 2023-09-27 MED ORDER — NITROGLYCERIN 0.4 MG SL SUBL: 0.4000 mg | SUBLINGUAL_TABLET | SUBLINGUAL | 3 refills | Status: AC | PRN

## 2023-09-27 NOTE — Patient Instructions (Addendum)
 Medication Instructions:  STOP Losartan   START Valsartan  160mg  Take 1 table once a day  START Nitroglycerin  0.4mg  Take 1 as needed for emergency chest pain. Take first dose for emergency chest pain; WAIT 5 minutes and then take 2nd dose. IF still having pain if still having chest pain CALL 911 wait an additional 5 minutes before taking final dose. Do not take more than 3 doses in a day.  *If you need a refill on your cardiac medications before your next appointment, please call your pharmacy*  Lab Work: 2 WEEKS BMET (Oct 11, 2023 OR WEEK OF) If you have labs (blood work) drawn today and your tests are completely normal, you will receive your results only by: MyChart Message (if you have MyChart) OR A paper copy in the mail If you have any lab test that is abnormal or we need to change your treatment, we will call you to review the results.  Testing/Procedures: Your physician has recommended that you wear an 30 DAY event monitor. Event monitors are medical devices that record the heart's electrical activity. Doctors most often us  these monitors to diagnose arrhythmias. Arrhythmias are problems with the speed or rhythm of the heartbeat. The monitor is a small, portable device. You can wear one while you do your normal daily activities. This is usually used to diagnose what is causing palpitations/syncope (passing out).   Follow-Up: At [facility], you and your health needs are our priority.  As part of our continuing mission to provide you with exceptional heart care, our providers are all part of one team.  This team includes your primary Cardiologist (physician) and Advanced Practice Providers or APPs (Physician Assistants and Nurse Practitioners) who all work together to provide you with the care you need, when you need it.  Your next appointment:   6 month(s)  Provider:   Janelle Mediate, MD or Charles Connor, NP   We recommend signing up for the patient portal called "MyChart".  Sign  up information is provided on this After Visit Summary.  MyChart is used to connect with patients for Virtual Visits (Telemedicine).  Patients are able to view lab/test results, encounter notes, upcoming appointments, etc.  Non-urgent messages can be sent to your provider as well.   To learn more about what you can do with MyChart, go to [email or web address].   Other Instructions PLEASE CONTACT DR Ester Helms  FOR [facility] REFERRAL  Check your blood pressure daily for 2 weeks, then contact the office with your readings.  Contact the office either by phone or MyChart with your readings.  Make sure to check your blood pressure 2 hours after taking your medications.   AVOID these things for 30 minutes before checking your blood pressure: No Drinking caffeine. No Drinking alcohol. No Eating. No Smoking. No Exercising.  Five minutes before checking your blood pressure: Pee. Sit in a dining chair. Avoid sitting in a soft couch or armchair. Be quiet. Do not talk.       1st Floor: - Lobby - Registration  - Pharmacy  - Lab - Cafe  2nd Floor: - PV Lab - Diagnostic Testing (echo, CT, nuclear med)  3rd Floor: - Vacant  4th Floor: - TCTS (cardiothoracic surgery) - AFib Clinic - Structural Heart Clinic - Vascular Surgery  - Vascular Ultrasound  5th Floor: - HeartCare Cardiology (general and EP) - Clinical Pharmacy for coumadin, hypertension, lipid, weight-loss medications, and med management appointments    Valet parking services will be available as well.

## 2023-10-01 NOTE — Telephone Encounter (Signed)
 Patient was seen by A. Fib clinic.

## 2023-10-04 ENCOUNTER — Telehealth: Payer: Self-pay | Admitting: Nurse Practitioner

## 2023-10-04 DIAGNOSIS — I480 Paroxysmal atrial fibrillation: Secondary | ICD-10-CM

## 2023-10-04 NOTE — Telephone Encounter (Signed)
 Scheduled to have a cardiac event monitor applied in the office on 10/05/23.

## 2023-10-04 NOTE — Telephone Encounter (Signed)
 Called patient about his message. Patient stated he never got his monitor and he would need to come in to get it put on. Placed order and will message monitor techs.

## 2023-10-04 NOTE — Telephone Encounter (Signed)
 Pt states that he is supposed to be a monitor but has not gotten any information regarding this, requesting cb

## 2023-10-05 ENCOUNTER — Ambulatory Visit: Attending: Nurse Practitioner

## 2023-10-05 DIAGNOSIS — I480 Paroxysmal atrial fibrillation: Secondary | ICD-10-CM

## 2023-10-15 ENCOUNTER — Other Ambulatory Visit: Payer: Self-pay | Admitting: Cardiovascular Disease

## 2023-10-15 DIAGNOSIS — I4891 Unspecified atrial fibrillation: Secondary | ICD-10-CM

## 2023-10-15 NOTE — Telephone Encounter (Signed)
 Prescription refill request for Eliquis  received. Indication:afib Last office visit:4/25 Scr:0.95  4/25 Age: 53 Weight:108  kg  Prescription refilled

## 2023-10-22 ENCOUNTER — Encounter: Payer: Self-pay | Admitting: Family Medicine

## 2023-10-22 ENCOUNTER — Ambulatory Visit (INDEPENDENT_AMBULATORY_CARE_PROVIDER_SITE_OTHER): Payer: No Typology Code available for payment source | Admitting: Family Medicine

## 2023-10-22 VITALS — BP 118/70 | HR 70 | Temp 98.1°F | Resp 17 | Ht 74.0 in | Wt 234.8 lb

## 2023-10-22 DIAGNOSIS — I480 Paroxysmal atrial fibrillation: Secondary | ICD-10-CM | POA: Diagnosis not present

## 2023-10-22 DIAGNOSIS — I10 Essential (primary) hypertension: Secondary | ICD-10-CM

## 2023-10-22 DIAGNOSIS — R7303 Prediabetes: Secondary | ICD-10-CM | POA: Diagnosis not present

## 2023-10-22 DIAGNOSIS — E782 Mixed hyperlipidemia: Secondary | ICD-10-CM

## 2023-10-22 DIAGNOSIS — H1589 Other disorders of sclera: Secondary | ICD-10-CM

## 2023-10-22 DIAGNOSIS — I2510 Atherosclerotic heart disease of native coronary artery without angina pectoris: Secondary | ICD-10-CM

## 2023-10-22 MED ORDER — HYDROCHLOROTHIAZIDE 25 MG PO TABS: 25.0000 mg | ORAL_TABLET | Freq: Every day | ORAL | 1 refills | Status: DC

## 2023-10-22 NOTE — Patient Instructions (Signed)
 Thank you for coming today.  Glad to hear you are doing better since your hospitalization.  Blood pressure looks okay today, no medication changes.  I did order some labs to be performed in 1 month with a lab only visit here unless you are already scheduled to have that lab work through cardiology, either place is fine, but you do not need to have blood work at both locations.  No medication changes today.  Will follow-up in 6 months.  For the area of irritation on the outside of your right eye I would like the eye specialist to evaluate that area.  Please call them today if possible for an appointment in the next week or 2.  Be seen if any new or worsening symptoms.  Take care

## 2023-10-22 NOTE — Progress Notes (Signed)
 Subjective:  Patient ID: Donald [name], male    DOB: 1970/06/21  Age: 53 y.o. MRN: [id number]  CC:  Chief Complaint  Patient presents with   Medical Management of Chronic Issues    Pt is doing well, pt is fasting     HPI Donald [name] presents for   Hyperlipidemia: Treated with pravastatin  20 mg daily previously, now on Crestor  40 mg daily.  history of CAD, paroxysmal A-fib, right bundle branch block, SVT and hypertension followed by cardiology treated with Eliquis , verapamil , Imdur .  Losartan , verapamil  for hypertension.  Low risk PET CT in October 2024 without evidence of ischemia. Admitted April 11 with unstable angina.  Cath on 09/13/2023 with mild nonobstructive disease with no acute occlusions.  Preserved LV function, LVEDP of 15 mmHg with no evidence of Takotsubo cardiomyopathy.  EF 70 to 75%.  Pravastatin  was switched to Crestor  at the April visit, plan for repeat fast lipid panel and LFTs in 8 weeks. Taking crestor  once per day - rare aches in legs, - better than in past.   Lab Results  Component Value Date   CHOL 194 09/13/2023   HDL 45 09/13/2023   LDLCALC 139 (H) 09/13/2023   TRIG 48 09/13/2023   CHOLHDL 4.3 09/13/2023   Lab Results  Component Value Date   ALT 16 09/13/2023   AST 20 09/13/2023   ALKPHOS 39 09/13/2023   BILITOT 0.8 09/13/2023   Hypertension With CAD, paroxysmal A-fib as above. Eliquis  5 mg twice daily, Isosorbide  30 mg -one half daily with additional half tab as needed - not needed.  Hydrochlorothiazide  25 mg daily, losartan  100 mg daily, verapamil  120 mg CR daily with option of 40 mg additional dose as needed for breakthrough chest pain - not needed.  No new bleeding. Feels well.  No chest pain since hospitalization. Feeling well.  No med side effects  Home readings: 125/82 range on average.  BP Readings from Last 3 Encounters:  10/22/23 118/70  09/27/23 (!) 170/80  09/14/23 123/85   Lab Results  Component Value Date   CREATININE  0.95 09/14/2023   Prediabetes: A1c 6.0 in November, most recently is improved at 5.6 at his hospitalization on April 10. Normal BMP on 09/14/2023. Lab Results  Component Value Date   HGBA1C 5.6 09/13/2023   Wt Readings from Last 3 Encounters:  10/22/23 234 lb 12.8 oz (106.5 kg)  09/27/23 238 lb 3.2 oz (108 kg)  09/13/23 234 lb 14.4 oz (106.5 kg)   R eye redness: Noticed a month ago - red on the outside part of the eye, resolves in few days. No pain. No vision changes. Area there now for past 2 days. No recent optho eval, but did see someone about a year ago.  He can call them for appointment.    History Patient Active Problem List   Diagnosis Date Noted   Unstable angina (HCC) 09/13/2023   Hypercoagulable state due to paroxysmal atrial fibrillation (HCC) 09/07/2023   CAD (coronary artery disease) 11/19/2022   Chest pain 11/06/2022   Hypocalcemia 11/06/2022   Normocytic anemia 11/06/2022   Paroxysmal atrial fibrillation (HCC) 07/10/2022   Atrial fibrillation with RVR (HCC) 06/03/2022   Atypical chest pain 04/02/2021   HLD (hyperlipidemia) 08/25/2020   Encounter for general adult medical examination with abnormal findings 08/19/2020   Prediabetes 08/19/2020   Screen for colon cancer 08/19/2020   Diuretic-induced hypokalemia 08/19/2020   Chronic bilateral low back pain without sciatica 08/19/2020   Hypokalemia 01/20/2016   Right  bundle branch block 03/11/2010   Essential hypertension 05/14/2009   PARASOMNIA 05/14/2009   G E R D 05/13/2009   Past Medical History:  Diagnosis Date   Blood transfusion without reported diagnosis    Chronic bilateral low back pain without sciatica 08/19/2020   DEPRESSION, MILD    Dysuria    G E R D    Hypercholesterolemia    Hyperlipidemia    NOT TAKING MEDICATION   Hypertension    Lumbago    NEPHROLITHIASIS, HX OF    PARASOMNIA    Paroxysmal atrial fibrillation (HCC) 07/10/2022   Prediabetes 08/19/2020   Right bundle branch block     Past Surgical History:  Procedure Laterality Date   CARDIAC CATHETERIZATION N/A 01/19/2016   Procedure: Left Heart Cath and Coronary Angiography;  Surgeon: Arnoldo Lapping, MD;  Location: [facility] INVASIVE CV LAB;  Service: Cardiovascular;  Laterality: N/A;   HAND SURGERY     LEFT HEART CATH AND CORONARY ANGIOGRAPHY N/A 09/13/2023   Procedure: LEFT HEART CATH AND CORONARY ANGIOGRAPHY;  Surgeon: Kyra Phy, MD;  Location: [facility] INVASIVE CV LAB;  Service: Cardiovascular;  Laterality: N/A;   right and left hand     Allergies  Allergen Reactions   Atorvastatin  Other (See Comments)    Muscle pain   Zestril  [Lisinopril ] Cough   Ace Inhibitors Cough   Pork-Derived Products Other (See Comments)    Per religious beliefs - consented to heparin  products 09/13/23   Prior to Admission medications   Medication Sig Start Date End Date Taking? Authorizing Provider  acetaminophen  (TYLENOL ) 500 MG tablet Take 500 mg by mouth daily as needed for moderate pain (pain score 4-6) or headache.   Yes [provider]  b complex vitamins capsule Take 1 capsule by mouth daily.   Yes [provider]  Cholecalciferol  (VITAMIN D -3 PO) Take 1 tablet by mouth daily.   Yes [provider]  Cyanocobalamin (VITAMIN B-12 PO) Take 1 tablet by mouth daily.   Yes [provider]  ELIQUIS  5 MG TABS tablet TAKE 1 TABLET BY MOUTH TWICE A DAY 10/15/23  Yes Loyde Rule, MD  hydrochlorothiazide  (HYDRODIURIL ) 25 MG tablet TAKE 1 TABLET (25 MG TOTAL) BY MOUTH DAILY. 06/25/23  Yes Benjiman Bras, MD  isosorbide  mononitrate (IMDUR ) 30 MG 24 hr tablet Take 0.5 tablets (15 mg total) by mouth daily. Can take an additional half tab as needed for chest pain Patient taking differently: Take 30 mg by mouth daily as needed (chest pain). 03/26/23  Yes Gerald Kitty., NP  MAGNESIUM PO Take 1 tablet by mouth daily.   Yes [provider]  Multiple Vitamins-Minerals (ZINC PO) Take 1 tablet by mouth  daily.   Yes [provider]  nitroGLYCERIN  (NITROSTAT ) 0.4 MG SL tablet Place 1 tablet (0.4 mg total) under the tongue every 5 (five) minutes as needed. 09/27/23 12/26/23 Yes Gerald Kitty., NP  pantoprazole  (PROTONIX ) 40 MG tablet Take 1 tablet (40 mg total) by mouth daily as needed (heartburn). 09/14/23  Yes Johnie Nailer B, NP  POTASSIUM PO Take 1 tablet by mouth daily. OTC   Yes [provider]  rosuvastatin  (CRESTOR ) 20 MG tablet Take 1 tablet (20 mg total) by mouth daily. 09/14/23 09/13/24 Yes Sanjuanita Cruz, NP  valsartan  (DIOVAN ) 160 MG tablet Take 1 tablet (160 mg total) by mouth daily. 09/27/23  Yes Gerald Kitty., NP  verapamil  (CALAN ) 40 MG tablet TAKE 1 TABLET (40 MG TOTAL)  BY MOUTH DAILY AS NEEDED (BREAKTHROUGH CHEST PAIN). 04/24/23  Yes Gerald Kitty., NP  verapamil  (CALAN -SR) 120 MG CR tablet TAKE 1 TABLET BY MOUTH AT BEDTIME. 06/21/23  Yes Loyde Rule, MD   Social History   Socioeconomic History   Marital status: Married    Spouse name: Not on file   Number of children: 6   Years of education: 15   Highest education level: Not on file  Occupational History   Occupation: Location manager    Employer: [employer]  Tobacco Use   Smoking status: Former    Types: Cigarettes   Smokeless tobacco: Never   Tobacco comments:    Former smoker 09/07/23  Substance and Sexual Activity   Alcohol use: No    Alcohol/week: 0.0 standard drinks of alcohol   Drug use: No   Sexual activity: Not Currently  Other Topics Concern   Not on file  Social History Narrative   ** Merged History Encounter **       Social Drivers of Health   Financial Resource Strain: Not on file  Food Insecurity: No Food Insecurity (09/14/2023)   Hunger Vital Sign    Worried About Running Out of Food in the Last Year: Never true    Ran Out of Food in the Last Year: Never true  Transportation Needs: No Transportation Needs (09/14/2023)   PRAPARE - Therapist, art (Medical): No    Lack of Transportation (Non-Medical): No  Physical Activity: Not on file  Stress: Not on file  Social Connections: Unknown (10/18/2021)   [facility]   Social Network    Social Network: Not on file  Intimate Partner Violence: Not At Risk (09/14/2023)   Humiliation, Afraid, Rape, and Kick questionnaire    Fear of Current or Ex-Partner: No    Emotionally Abused: No    Physically Abused: No    Sexually Abused: No    Review of Systems   Objective:   Vitals:   10/22/23 0924  BP: 118/70  Pulse: 70  Resp: 17  Temp: 98.1 F (36.7 C)  TempSrc: Temporal  SpO2: 99%  Weight: 234 lb 12.8 oz (106.5 kg)  Height: 6\' 2"  (1.88 m)     Physical Exam Vitals reviewed.  Constitutional:      Appearance: He is well-developed.  HENT:     Head: Normocephalic and atraumatic.  Eyes:     Extraocular Movements: Extraocular movements intact.     Pupils: Pupils are equal, round, and reactive to light.     Comments: Lateral sclera with injection, possible redundant tissue versus superficial scleral injury laterally.  See photo.  Neck:     Vascular: No carotid bruit or JVD.  Cardiovascular:     Rate and Rhythm: Normal rate and regular rhythm.     Heart sounds: Normal heart sounds. No murmur heard. Pulmonary:     Effort: Pulmonary effort is normal.     Breath sounds: Normal breath sounds. No rales.  Musculoskeletal:     Right lower leg: No edema.     Left lower leg: No edema.  Skin:    General: Skin is warm and dry.  Neurological:     Mental Status: He is alert and oriented to person, place, and time.  Psychiatric:        Mood and Affect: Mood normal.         Assessment & Plan:  Zaide Kardell is a 53  y.o. male . Prediabetes Improved on recent A1c in the hospital, no changes for now, hold on repeat labs.  Mixed hyperlipidemia - Plan: Comprehensive metabolic panel with GFR, Lipid panel  - Tolerating Crestor ,  plan for lab only visit in 1 month either here or if cardiology schedules.  Essential hypertension  - Stable with current regimen, no changes  Paroxysmal atrial fibrillation (HCC)  - Stable, sinus rhythm in office, tolerating anticoagulation, rate control with diltiazem  as above.  Coronary artery disease involving native coronary artery of native heart without angina pectoris  - Asymptomatic since hospitalization, overall reassuring workup at that time, tolerating current meds including statin, antihypertensive regimen and isosorbide .  Continue follow-up with cardiology as planned with ER/RTC precautions.  Scleral discoloration  - Question scleral tear or irritation, asymptomatic.  Recommended ophthalmology follow-up, he will call to schedule with his previous provider or will let me know if I need to assist.   No orders of the defined types were placed in this encounter.  Patient Instructions  Thank you for coming today.  Glad to hear you are doing better since your hospitalization.  Blood pressure looks okay today, no medication changes.  I did order some labs to be performed in 1 month with a lab only visit here unless you are already scheduled to have that lab work through cardiology, either place is fine, but you do not need to have blood work at both locations.  No medication changes today.  Will follow-up in 6 months.  For the area of irritation on the outside of your right eye I would like the eye specialist to evaluate that area.  Please call them today if possible for an appointment in the next week or 2.  Be seen if any new or worsening symptoms.  Take care    Signed,   Caro Christmas, MD [facility], [facility] 10/22/23 9:56 AM

## 2023-11-05 ENCOUNTER — Ambulatory Visit: Payer: Self-pay | Admitting: Nurse Practitioner

## 2023-11-05 DIAGNOSIS — I480 Paroxysmal atrial fibrillation: Secondary | ICD-10-CM | POA: Diagnosis not present

## 2023-11-26 ENCOUNTER — Other Ambulatory Visit (INDEPENDENT_AMBULATORY_CARE_PROVIDER_SITE_OTHER)

## 2023-11-26 DIAGNOSIS — E782 Mixed hyperlipidemia: Secondary | ICD-10-CM

## 2023-11-26 LAB — COMPREHENSIVE METABOLIC PANEL WITH GFR
ALT: 21 U/L (ref 0–53)
AST: 24 U/L (ref 0–37)
Albumin: 4.2 g/dL (ref 3.5–5.2)
Alkaline Phosphatase: 43 U/L (ref 39–117)
BUN: 14 mg/dL (ref 6–23)
CO2: 27 meq/L (ref 19–32)
Calcium: 9.4 mg/dL (ref 8.4–10.5)
Chloride: 103 meq/L (ref 96–112)
Creatinine, Ser: 0.96 mg/dL (ref 0.40–1.50)
GFR: 89.05 mL/min (ref >60.00)
Glucose, Bld: 90 mg/dL (ref 70–99)
Potassium: 4 meq/L (ref 3.5–5.1)
Sodium: 138 meq/L (ref 135–145)
Total Bilirubin: 0.7 mg/dL (ref 0.2–1.2)
Total Protein: 7.7 g/dL (ref 6.0–8.3)

## 2023-11-26 LAB — LIPID PANEL
Cholesterol: 134 mg/dL (ref 0–200)
HDL: 50.5 mg/dL (ref >39.00)
LDL Cholesterol: 73 mg/dL (ref 0–99)
NonHDL: 83.69
Total CHOL/HDL Ratio: 3
Triglycerides: 51 mg/dL (ref 0.0–149.0)
VLDL: 10.2 mg/dL (ref 0.0–40.0)

## 2023-11-27 ENCOUNTER — Ambulatory Visit: Payer: Self-pay | Admitting: Family Medicine

## 2024-02-17 ENCOUNTER — Other Ambulatory Visit: Payer: Self-pay

## 2024-02-17 ENCOUNTER — Emergency Department (HOSPITAL_BASED_OUTPATIENT_CLINIC_OR_DEPARTMENT_OTHER)
Admission: EM | Admit: 2024-02-17 | Discharge: 2024-02-17 | Disposition: A | Discharge status: Home / Self Care | Attending: Emergency Medicine | Admitting: Emergency Medicine

## 2024-02-17 ENCOUNTER — Encounter (HOSPITAL_BASED_OUTPATIENT_CLINIC_OR_DEPARTMENT_OTHER): Payer: Self-pay

## 2024-02-17 DIAGNOSIS — I10 Essential (primary) hypertension: Secondary | ICD-10-CM | POA: Diagnosis not present

## 2024-02-17 DIAGNOSIS — Z79899 Other long term (current) drug therapy: Secondary | ICD-10-CM | POA: Insufficient documentation

## 2024-02-17 DIAGNOSIS — Z7901 Long term (current) use of anticoagulants: Secondary | ICD-10-CM | POA: Diagnosis not present

## 2024-02-17 DIAGNOSIS — M79605 Pain in left leg: Secondary | ICD-10-CM | POA: Diagnosis present

## 2024-02-17 DIAGNOSIS — I4891 Unspecified atrial fibrillation: Secondary | ICD-10-CM | POA: Insufficient documentation

## 2024-02-17 LAB — CBC WITH DIFFERENTIAL/PLATELET
Abs Immature Granulocytes: 0.02 K/uL (ref 0.00–0.07)
Basophils Absolute: 0.1 K/uL (ref 0.0–0.1)
Basophils Relative: 1 %
Eosinophils Absolute: 0.3 K/uL (ref 0.0–0.5)
Eosinophils Relative: 3 %
HCT: 39.2 % (ref 39.0–52.0)
Hemoglobin: 13.2 g/dL (ref 13.0–17.0)
Immature Granulocytes: 0 %
Lymphocytes Relative: 45 %
Lymphs Abs: 3.7 K/uL (ref 0.7–4.0)
MCH: 27.9 pg (ref 26.0–34.0)
MCHC: 33.7 g/dL (ref 30.0–36.0)
MCV: 82.9 fL (ref 80.0–100.0)
Monocytes Absolute: 1 K/uL (ref 0.1–1.0)
Monocytes Relative: 12 %
Neutro Abs: 3.2 K/uL (ref 1.7–7.7)
Neutrophils Relative %: 39 %
Platelets: 306 K/uL (ref 150–400)
RBC: 4.73 MIL/uL (ref 4.22–5.81)
RDW: 13.8 % (ref 11.5–15.5)
WBC: 8.2 K/uL (ref 4.0–10.5)
nRBC: 0 % (ref 0.0–0.2)

## 2024-02-17 LAB — COMPREHENSIVE METABOLIC PANEL WITH GFR
ALT: 12 U/L (ref 0–44)
AST: 18 U/L (ref 15–41)
Albumin: 4.4 g/dL (ref 3.5–5.0)
Alkaline Phosphatase: 59 U/L (ref 38–126)
Anion gap: 13 (ref 5–15)
BUN: 13 mg/dL (ref 6–20)
CO2: 24 mmol/L (ref 22–32)
Calcium: 9.5 mg/dL (ref 8.9–10.3)
Chloride: 101 mmol/L (ref 98–111)
Creatinine, Ser: 0.93 mg/dL (ref 0.61–1.24)
GFR, Estimated: >60 mL/min (ref >60)
Glucose, Bld: 97 mg/dL (ref 70–99)
Potassium: 3.6 mmol/L (ref 3.5–5.1)
Sodium: 138 mmol/L (ref 135–145)
Total Bilirubin: 0.3 mg/dL (ref 0.0–1.2)
Total Protein: 7.6 g/dL (ref 6.5–8.1)

## 2024-02-17 MED ORDER — CYCLOBENZAPRINE HCL 10 MG PO TABS: 10.0000 mg | ORAL_TABLET | Freq: Two times a day (BID) | ORAL | 0 refills | Status: AC | PRN

## 2024-02-17 NOTE — ED Provider Notes (Signed)
 [place] EMERGENCY DEPARTMENT AT [facility] Provider Note   CSN: [id number] Arrival date & time: 02/17/24  1902     Patient presents with: Leg Pain (L)   Donald [name] is a 53 y.o. male.   Patient here with pain in his left calf left shin on and off for last few days. Feels like maybe a spasm. But his concern for blood clot. He is all blood thinner however for a fib. He denies any fevers or chills. Denies any trauma. Nothing makes it worse or better. He is not having any abdominal pain back pain. No weakness numbness tingling. History of hypertension high cholesterol.  The history is provided by the patient.       Prior to Admission medications   Medication Sig Start Date End Date Taking? Authorizing Provider  cyclobenzaprine  (FLEXERIL ) 10 MG tablet Take 1 tablet (10 mg total) by mouth 2 (two) times daily as needed for muscle spasms. 02/17/24  Yes Asael Pann, DO  acetaminophen  (TYLENOL ) 500 MG tablet Take 500 mg by mouth daily as needed for moderate pain (pain score 4-6) or headache.    [provider]  b complex vitamins capsule Take 1 capsule by mouth daily.    [provider]  Cholecalciferol  (VITAMIN D -3 PO) Take 1 tablet by mouth daily.    [provider]  Cyanocobalamin (VITAMIN B-12 PO) Take 1 tablet by mouth daily.    [provider]  ELIQUIS  5 MG TABS tablet TAKE 1 TABLET BY MOUTH TWICE A DAY 10/15/23   Delford Maude BROCKS, MD  hydrochlorothiazide  (HYDRODIURIL ) 25 MG tablet Take 1 tablet (25 mg total) by mouth daily. 10/22/23   Levora Reyes SAUNDERS, MD  isosorbide  mononitrate (IMDUR ) 30 MG 24 hr tablet Take 0.5 tablets (15 mg total) by mouth daily. Can take an additional half tab as needed for chest pain Patient taking differently: Take 30 mg by mouth daily as needed (chest pain). 03/26/23   Wyn Jackee VEAR Mickey., NP  MAGNESIUM PO Take 1 tablet by mouth daily.    [provider]  Multiple Vitamins-Minerals (ZINC PO) Take 1  tablet by mouth daily.    [provider]  nitroGLYCERIN  (NITROSTAT ) 0.4 MG SL tablet Place 1 tablet (0.4 mg total) under the tongue every 5 (five) minutes as needed. 09/27/23 12/26/23  Wyn Jackee VEAR Mickey., NP  pantoprazole  (PROTONIX ) 40 MG tablet Take 1 tablet (40 mg total) by mouth daily as needed (heartburn). 09/14/23   Henry Manuelita NOVAK, NP  POTASSIUM PO Take 1 tablet by mouth daily. OTC    [provider]  rosuvastatin  (CRESTOR ) 20 MG tablet Take 1 tablet (20 mg total) by mouth daily. 09/14/23 09/13/24  Henry Manuelita NOVAK, NP  valsartan  (DIOVAN ) 160 MG tablet Take 1 tablet (160 mg total) by mouth daily. 09/27/23   Wyn Jackee VEAR Mickey., NP  verapamil  (CALAN ) 40 MG tablet TAKE 1 TABLET (40 MG TOTAL) BY MOUTH DAILY AS NEEDED (BREAKTHROUGH CHEST PAIN). 04/24/23   Wyn Jackee VEAR Mickey., NP  verapamil  (CALAN -SR) 120 MG CR tablet TAKE 1 TABLET BY MOUTH AT BEDTIME. 06/21/23   Delford Maude BROCKS, MD    Allergies: Atorvastatin , Zestril  [lisinopril ], Ace inhibitors, and Pork-derived products    Review of Systems  Updated Vital Signs BP (!) 181/88 (BP Location: Right Arm)   Pulse 88   Temp 98 F (36.7 C)   Resp 18   SpO2 99%   Physical Exam Vitals and nursing note reviewed.  Constitutional:  General: He is not in acute distress.    Appearance: He is well-developed. He is not ill-appearing.  HENT:     Head: Normocephalic and atraumatic.     Nose: Nose normal.     Mouth/Throat:     Mouth: Mucous membranes are moist.  Eyes:     Extraocular Movements: Extraocular movements intact.     Conjunctiva/sclera: Conjunctivae normal.     Pupils: Pupils are equal, round, and reactive to light.  Cardiovascular:     Rate and Rhythm: Normal rate and regular rhythm.     Pulses: Normal pulses.     Heart sounds: Normal heart sounds. No murmur heard. Pulmonary:     Effort: Pulmonary effort is normal. No respiratory distress.     Breath sounds: Normal breath sounds.  Abdominal:     Palpations:  Abdomen is soft.     Tenderness: There is no abdominal tenderness.  Musculoskeletal:        General: Tenderness present. No swelling.     Cervical back: Normal range of motion and neck supple.     Comments: Some tenderness to the anterior shin and calf  Skin:    General: Skin is warm and dry.     Capillary Refill: Capillary refill takes less than 2 seconds.     Findings: No erythema.  Neurological:     General: No focal deficit present.     Mental Status: He is alert and oriented to person, place, and time.     Cranial Nerves: No cranial nerve deficit.     Sensory: No sensory deficit.     Motor: No weakness.     Coordination: Coordination normal.  Psychiatric:        Mood and Affect: Mood normal.     (all labs ordered are listed, but only abnormal results are displayed) Labs Reviewed  COMPREHENSIVE METABOLIC PANEL WITH GFR  CBC WITH DIFFERENTIAL/PLATELET    EKG: None  Radiology: No results found.   Procedures   Medications Ordered in the ED - No data to display                                  Medical Decision Making Amount and/or Complexity of Data Reviewed Labs: ordered.  Risk Prescription drug management.   [name] is here with left shin left calf pain on an offer the last several days had a sharp when it happens he is not having any current pain now. He has a history of a fable in anticoagulation. History of hypertension high cholesterol. He is not having any back pain. No loss of bowel or bladder. No fever no chills. No trauma history. His neurovascular neuromuscular intact on exam. He has good pulses on exam. I've no concern for arterial process. That doesn't appear to be cellulitis. No concern for traumatic process. Basic labs were obtained to evaluate for any electrolyte abnormality and those were unremarkable for my review and interpretation. I do suspect likely muscle spasms shin splints is likely the cause of his pain. He is concerned maybe for  DVT and I think it's reasonable to get an ultrasound done. He is ready on anticoagulation. Ultrasound is in here today will have him come back tomorrow morning for ultrasound. Otherwise all prescribed Flexeril . Having follow-up with his primary care doctor ultrasounds negative. Patient discharge in good condition. He is not having any shortness of breath chest pain or any other concerning symptoms.  This chart was dictated using voice recognition software.  Despite best efforts to proofread,  errors can occur which can change the documentation meaning.      Final diagnoses:  Left leg pain    ED Discharge Orders          Ordered    US  Venous Img Lower Unilateral Left        02/17/24 2147    cyclobenzaprine  (FLEXERIL ) 10 MG tablet  2 times daily PRN        02/17/24 2147               Ruthe Cornet, DO 02/17/24 2150

## 2024-02-17 NOTE — ED Triage Notes (Signed)
 Pt c/o L leg pain onset approx 1wk ago, coming & going, 8/10 sharp in nature. Denies swelling, redness, heat to area, SHOB.

## 2024-02-17 NOTE — Discharge Instructions (Signed)
 Come back tomorrow for DVT study. I do suspect may be a muscle spasm. Take Flexeril  as needed for this. Continue your blood thinner.

## 2024-02-18 ENCOUNTER — Ambulatory Visit (HOSPITAL_BASED_OUTPATIENT_CLINIC_OR_DEPARTMENT_OTHER)
Admission: RE | Admit: 2024-02-18 | Discharge: 2024-02-18 | Disposition: A | Discharge status: Home / Self Care | Source: Ambulatory Visit | Attending: Emergency Medicine | Admitting: Emergency Medicine

## 2024-02-18 DIAGNOSIS — M79662 Pain in left lower leg: Secondary | ICD-10-CM | POA: Insufficient documentation

## 2024-03-17 ENCOUNTER — Other Ambulatory Visit: Payer: Self-pay | Admitting: Family Medicine

## 2024-03-17 DIAGNOSIS — I10 Essential (primary) hypertension: Secondary | ICD-10-CM

## 2024-03-18 NOTE — Telephone Encounter (Signed)
 Medication was d/c by Cardiology. Denying medication  We will discontinue losartan  100 mg and start valsartan  160 mg daily

## 2024-03-21 ENCOUNTER — Other Ambulatory Visit (HOSPITAL_BASED_OUTPATIENT_CLINIC_OR_DEPARTMENT_OTHER): Payer: Self-pay

## 2024-03-21 ENCOUNTER — Other Ambulatory Visit: Payer: Self-pay | Admitting: Nurse Practitioner

## 2024-03-21 ENCOUNTER — Ambulatory Visit (INDEPENDENT_AMBULATORY_CARE_PROVIDER_SITE_OTHER): Admitting: Student

## 2024-03-21 DIAGNOSIS — M79605 Pain in left leg: Secondary | ICD-10-CM

## 2024-03-21 MED ORDER — METHYLPREDNISOLONE 4 MG PO TBPK
ORAL_TABLET | ORAL | 0 refills | Status: DC
  Filled 2024-03-21: qty 21, 6d supply, fill #0

## 2024-03-21 NOTE — Progress Notes (Signed)
 Chief Complaint: Left leg pain     History of Present Illness:    Donald [name] is a 53 y.o. male presenting today for evaluation of left leg pain.  His symptoms began about 2 months ago without any known injury or cause.  Describes the pain as moderate in severity, sharp, worse at night, and is located in the posterior calf and throughout the thigh.  Today he denies any back pain, numbness, or tingling although does have some very occasional discomfort in his low back.  He was seen in the ED on 9/14 for this issue where Doppler ultrasound was negative for DVT.  He was given Flexeril  but states that this gave him no relief and made him drowsy.  He takes Tylenol  with some relief.  Denies any other treatments at this time.   Surgical History:   None  PMH/PSH/Family History/Social History/Meds/Allergies:    Past Medical History:  Diagnosis Date   Blood transfusion without reported diagnosis    Chronic bilateral low back pain without sciatica 08/19/2020   DEPRESSION, MILD    Dysuria    G E R D    Hypercholesterolemia    Hyperlipidemia    NOT TAKING MEDICATION   Hypertension    Lumbago    NEPHROLITHIASIS, HX OF    PARASOMNIA    Paroxysmal atrial fibrillation (HCC) 07/10/2022   Prediabetes 08/19/2020   Right bundle branch block    Past Surgical History:  Procedure Laterality Date   CARDIAC CATHETERIZATION N/A 01/19/2016   Procedure: Left Heart Cath and Coronary Angiography;  Surgeon: Ozell Fell, MD;  Location: [facility] INVASIVE CV LAB;  Service: Cardiovascular;  Laterality: N/A;   HAND SURGERY     LEFT HEART CATH AND CORONARY ANGIOGRAPHY N/A 09/13/2023   Procedure: LEFT HEART CATH AND CORONARY ANGIOGRAPHY;  Surgeon: Wendel Lurena POUR, MD;  Location: [facility] INVASIVE CV LAB;  Service: Cardiovascular;  Laterality: N/A;   right and left hand     Social History   Socioeconomic History   Marital status: Married    Spouse name: Not on file   Number of  children: 6   Years of education: 15   Highest education level: Not on file  Occupational History   Occupation: Location manager    Employer: [employer]  Tobacco Use   Smoking status: Former    Types: Cigarettes   Smokeless tobacco: Never   Tobacco comments:    Former smoker 09/07/23  Substance and Sexual Activity   Alcohol use: No    Alcohol/week: 0.0 standard drinks of alcohol   Drug use: No   Sexual activity: Not Currently  Other Topics Concern   Not on file  Social History Narrative   ** Merged History Encounter **       Social Drivers of Health   Financial Resource Strain: Not on file  Food Insecurity: No Food Insecurity (09/14/2023)   Hunger Vital Sign    Worried About Running Out of Food in the Last Year: Never true    Ran Out of Food in the Last Year: Never true  Transportation Needs: No Transportation Needs (09/14/2023)   PRAPARE - Administrator, Civil Service (Medical): No    Lack of Transportation (Non-Medical): No  Physical Activity: Not on file  Stress: Not on file  Social  Connections: Unknown (10/18/2021)   Received from [facility]   Social Network    Social Network: Not on file   Family History  Problem Relation Age of Onset   Hypertension Mother    Hypertension Sister    Allergies  Allergen Reactions   Atorvastatin  Other (See Comments)    Muscle pain   Zestril  [Lisinopril ] Cough   Ace Inhibitors Cough   Porcine (Pork) Protein-Containing Drug Products Other (See Comments)    Per religious beliefs - consented to heparin  products 09/13/23   Current Outpatient Medications  Medication Sig Dispense Refill   methylPREDNISolone (MEDROL DOSEPAK) 4 MG TBPK tablet Take per packet instructions 21 tablet 0   acetaminophen  (TYLENOL ) 500 MG tablet Take 500 mg by mouth daily as needed for moderate pain (pain score 4-6) or headache.     b complex vitamins capsule Take 1 capsule by mouth daily.     Cholecalciferol  (VITAMIN D -3 PO) Take 1  tablet by mouth daily.     Cyanocobalamin (VITAMIN B-12 PO) Take 1 tablet by mouth daily.     cyclobenzaprine  (FLEXERIL ) 10 MG tablet Take 1 tablet (10 mg total) by mouth 2 (two) times daily as needed for muscle spasms. 20 tablet 0   ELIQUIS  5 MG TABS tablet TAKE 1 TABLET BY MOUTH TWICE A DAY 180 tablet 1   hydrochlorothiazide  (HYDRODIURIL ) 25 MG tablet Take 1 tablet (25 mg total) by mouth daily. 90 tablet 1   isosorbide  mononitrate (IMDUR ) 30 MG 24 hr tablet Take 0.5 tablets (15 mg total) by mouth daily. Can take an additional half tab as needed for chest pain (Patient taking differently: Take 30 mg by mouth daily as needed (chest pain).) 90 tablet 0   MAGNESIUM PO Take 1 tablet by mouth daily.     Multiple Vitamins-Minerals (ZINC PO) Take 1 tablet by mouth daily.     nitroGLYCERIN  (NITROSTAT ) 0.4 MG SL tablet Place 1 tablet (0.4 mg total) under the tongue every 5 (five) minutes as needed. 25 tablet 3   pantoprazole  (PROTONIX ) 40 MG tablet Take 1 tablet (40 mg total) by mouth daily as needed (heartburn).     POTASSIUM PO Take 1 tablet by mouth daily. OTC     rosuvastatin  (CRESTOR ) 20 MG tablet Take 1 tablet (20 mg total) by mouth daily. 90 tablet 1   valsartan  (DIOVAN ) 160 MG tablet Take 1 tablet (160 mg total) by mouth daily. 90 tablet 1   verapamil  (CALAN ) 40 MG tablet TAKE 1 TABLET (40 MG TOTAL) BY MOUTH DAILY AS NEEDED (BREAKTHROUGH CHEST PAIN). 90 tablet 3   verapamil  (CALAN -SR) 120 MG CR tablet TAKE 1 TABLET BY MOUTH AT BEDTIME. 90 tablet 3   No current facility-administered medications for this visit.   No results found.  Review of Systems:   A ROS was performed including pertinent positives and negatives as documented in the HPI.  Physical Exam :   Constitutional: NAD and appears stated age Neurological: Alert and oriented Psych: Appropriate affect and cooperative There were no vitals taken for this visit.   Comprehensive Musculoskeletal Exam:    No tenderness on the low back  over the midline or paraspinal musculature.  Negative left straight leg raise.  There is some tenderness over the distal IT band.  No other notable tenderness in the thigh and calf is supple and nontender.  Bilateral knee flexion/extension and ankle dorsiflexion/plantarflexion strength is 5/5.  Negative FABER.  Imaging:     Assessment:   53 y.o. male with  48-month history of atraumatic left leg pain particularly in the calf and thigh.  There is no pain or tenderness in the back, buttock, or left ankle.  No red flag symptoms or strength deficits present.  Patient did have a negative DVT study performed last month at the ED.  He does have some tenderness laterally over the IT band.  No significant sciatic symptoms or special test are present, although given his distribution of pain I would suspect that this is either sciatic versus muscular type pain.  Discussed that either way I believe he would benefit from some targeted physical therapy so we will plan to refer this today.  He is on Eliquis  and unable to take NSAIDs so we will also prescribe short course of oral steroids for symptom relief.  Will plan to have him follow-up 4 weeks after beginning PT if symptoms have worsened or persisted.  Patient is agreeable to plan.  Plan :    - Referral to physical therapy and follow-up if symptoms worsen or persist     I personally saw and evaluated the patient, and participated in the management and treatment plan.  Leonce Reveal, PA-C Orthopedics

## 2024-03-31 ENCOUNTER — Other Ambulatory Visit: Payer: Self-pay

## 2024-03-31 ENCOUNTER — Ambulatory Visit: Attending: Student | Admitting: Physical Therapy

## 2024-03-31 ENCOUNTER — Encounter: Payer: Self-pay | Admitting: Physical Therapy

## 2024-03-31 DIAGNOSIS — R262 Difficulty in walking, not elsewhere classified: Secondary | ICD-10-CM | POA: Diagnosis present

## 2024-03-31 DIAGNOSIS — M79662 Pain in left lower leg: Secondary | ICD-10-CM | POA: Insufficient documentation

## 2024-03-31 DIAGNOSIS — M79605 Pain in left leg: Secondary | ICD-10-CM | POA: Diagnosis present

## 2024-03-31 NOTE — Therapy (Signed)
 OUTPATIENT PHYSICAL THERAPY NEURO EVALUATION   Patient Name: Donald [name] MRN: [id number] DOB:[birth year], 53 y.o., male Today's Date: 03/31/2024   PCP: Levora Reyes SAUNDERS, MD REFERRING PROVIDER: Emiliano Leonce CROME, PA-C   END OF SESSION:  PT End of Session - 03/31/24 1656     Visit Number 1    Number of Visits 10    Date for Recertification  05/02/24    Authorization Type UHC    PT Start Time 1320    PT Stop Time 1400    PT Time Calculation (min) 40 min    Activity Tolerance Patient tolerated treatment well    Behavior During Therapy [facility] for tasks assessed/performed          Past Medical History:  Diagnosis Date   Blood transfusion without reported diagnosis    Chronic bilateral low back pain without sciatica 08/19/2020   DEPRESSION, MILD    Dysuria    G E R D    Hypercholesterolemia    Hyperlipidemia    NOT TAKING MEDICATION   Hypertension    Lumbago    NEPHROLITHIASIS, HX OF    PARASOMNIA    Paroxysmal atrial fibrillation (HCC) 07/10/2022   Prediabetes 08/19/2020   Right bundle branch block    Past Surgical History:  Procedure Laterality Date   CARDIAC CATHETERIZATION N/A 01/19/2016   Procedure: Left Heart Cath and Coronary Angiography;  Surgeon: Ozell Fell, MD;  Location: [facility] INVASIVE CV LAB;  Service: Cardiovascular;  Laterality: N/A;   HAND SURGERY     LEFT HEART CATH AND CORONARY ANGIOGRAPHY N/A 09/13/2023   Procedure: LEFT HEART CATH AND CORONARY ANGIOGRAPHY;  Surgeon: Wendel Lurena POUR, MD;  Location: [facility] INVASIVE CV LAB;  Service: Cardiovascular;  Laterality: N/A;   right and left hand     Patient Active Problem List   Diagnosis Date Noted   Unstable angina (HCC) 09/13/2023   Hypercoagulable state due to paroxysmal atrial fibrillation (HCC) 09/07/2023   CAD (coronary artery disease) 11/19/2022   Chest pain 11/06/2022   Hypocalcemia 11/06/2022   Normocytic anemia 11/06/2022   Paroxysmal atrial fibrillation (HCC) 07/10/2022   Atrial  fibrillation with RVR (HCC) 06/03/2022   Atypical chest pain 04/02/2021   HLD (hyperlipidemia) 08/25/2020   Encounter for general adult medical examination with abnormal findings 08/19/2020   Prediabetes 08/19/2020   Screen for colon cancer 08/19/2020   Diuretic-induced hypokalemia 08/19/2020   Chronic bilateral low back pain without sciatica 08/19/2020   Hypokalemia 01/20/2016   Right bundle branch block 03/11/2010   Essential hypertension 05/14/2009   PARASOMNIA 05/14/2009   G E R D 05/13/2009    ONSET DATE: 03/21/2024 (MD referral); ED visit 02/17/2024  REFERRING DIAG: M79.605 (ICD-10-CM) - Pain in left leg   THERAPY DIAG:  Pain in left lower leg  Pain in left leg  Difficulty in walking, not elsewhere classified  Rationale for Evaluation and Treatment: Rehabilitation  SUBJECTIVE:  SUBJECTIVE STATEMENT: Having L leg pain that has been going on since early September.  No reported specific mechanism of injury or fall.  Pain in calf, hamstring, knee joint, that can be sharp; does not go away fully. *At end of session, pt does report heel pain when he first wakes up in the morning, that precedes this leg pain-started about 7 months ago. Pt accompanied by: self  PERTINENT HISTORY: HTN, GERD, depression, negative for DVT with ultrasound at ED visit 02/2024  PAIN:  Are you having pain? Yes: NPRS scale: 4/10 Pain location: L calf, posterior knee, L posterior thigh Pain description: sharp Aggravating factors: unsure, tends to be worse at night Relieving factors: moving may be helpful, medication (from Dr. Emiliano) has helped Worse pain coming down steps PRECAUTIONS: None  RED FLAGS: None   WEIGHT BEARING RESTRICTIONS: No  FALLS: Has patient fallen in last 6 months? No  LIVING  ENVIRONMENT: Lives with: lives with their family Lives in: House/apartment Stairs: 2 flights of steps Has following equipment at home: None  PLOF: Independent and Vocation/Vocational requirements: stands long periods for work  PATIENT GOALS: To help get rid of pain  OBJECTIVE:  Note: Objective measures were completed at Evaluation unless otherwise noted.  DIAGNOSTIC FINDINGS: doppler negative for DVT  COGNITION: Overall cognitive status: Within functional limits for tasks assessed   SENSATION: Light touch: WFL  COORDINATION: WFL  MUSCLE LENGTH: (tested in supine position) Hamstrings: Right NT deg; Left 0 deg Debby test: Right normal deg; Left -12 deg  POSTURE: No Significant postural limitations  LOWER EXTREMITY ROM:   in sitting, AROM is WFL Supine, SLR is negative  Passive -supine Right Eval Left Eval  Hip flexion    Hip extension    Hip abduction    Hip adduction    Hip internal rotation    Hip external rotation    Knee flexion    Knee extension  0  Ankle dorsiflexion  12-pain in calf  Ankle plantarflexion    Ankle inversion    Ankle eversion     (Blank rows = not tested)  LOWER EXTREMITY MMT:  5/5 BLE  MMT Right Eval Left Eval  Hip flexion    Hip extension    Hip abduction    Hip adduction    Hip internal rotation    Hip external rotation    Knee flexion    Knee extension    Ankle dorsiflexion    Ankle plantarflexion    Ankle inversion    Ankle eversion    (Blank rows = not tested)  TRANSFERS: Sit to stand: Complete Independence  Assistive device utilized: None     Stand to sit: Complete Independence  Assistive device utilized: None      GAIT: Findings: Gait Characteristics: step through pattern, Distance walked: 50 ft, Assistive device utilized:None, Level of assistance: Modified independence, and Comments: reports pain causes unstable (likely antalgic) pattern of gait at times  FUNCTIONAL TESTS:  5 times sit to stand: 12.09 sec 10  meter walk test: 7.78 sec (4.42 ft/sec) Repeated lumbar flexion:  5/10 in posterior thigh Repeated lumbar extension 5/10 posterior thigh (no change) *No back pain reported throughout PT eval  PALPATION:  Pt is tender to palpation along distal aspect of L IT band; he is tender to palpation along L ant tib muscle belly and posterior knee hamstrings insertion  TREATMENT DATE: 03/31/2024    PATIENT EDUCATION: Education details: Eval results, POC; HEP initiated-gentle stretch education Person educated: Patient Education method: Explanation, Demonstration, and Handouts Education comprehension: verbalized understanding, returned demonstration, and needs further education  HOME EXERCISE PROGRAM: Access Code: [facility] URL: [email or web address] Date: 03/31/2024 Prepared by: [facility] - Outpatient  Rehab - [facility]  Exercises - Standing Gastroc Stretch at Counter  - 1-2 x daily - 7 x weekly - 1 sets - 3 reps - 30 sec hold - Seated Hamstring Stretch  - 1-2 x daily - 7 x weekly - 1 sets - 3 reps - 30 sec hold  GOALS: Goals reviewed with patient? Yes  SHORT TERM GOALS: Target date: 04/11/2024  Pt will be independent with HEP for improved flexibility, mobility, decreased pain. Baseline: Goal status: INITIAL   LONG TERM GOALS: Target date: 05/02/2024  Pt will be independent with progression of HEP for improved pain, functional mobility. Baseline:  Goal status: INITIAL  2.  Pt will rate 50% reduction in pain in LLE, to improved participation in daily activities. Baseline: 5/10 Goal status: INITIAL  3.  Pt will improve 5x sit<>stand to less than or equal to 10 sec to demonstrate improved functional strength and transfer efficiency. Baseline: 12.06 sec Goal status: INITIAL  4.  Thomas test LLE to improve to less than 5 degrees, for  improved LLE flexibility and decreased pain. Baseline: -12 degrees Goal status: INITIAL  ASSESSMENT:  CLINICAL IMPRESSION: Patient is a 53 y.o. male who was seen today for physical therapy evaluation and treatment for leg pain.  Pt reports LLE pain in calf, posterior knee and thigh, which is sharp in nature, and started early September.  He reports this is not a radiating pain and he does not have back pain.  He was seen in ED 02/17/2024 and ultrasound for L calf was negative for DVT.  Assessed lumbar motions, with SLR test negative, repeated lumbar flexion and extension do not increase pain or cause radiation of pain.  Pt does not have pain with resisted MMT motions; he does have pain with palpation at distal IT band, hamstring insertion posterior knee and ant tib muscle proximally, as well as tightness noted in L calf.  With [employer], he is tight L hip flexors.  No specific mechanism of injury or fall reported by patient, though he does report near end of eval that he has has L heel pain upon first getting up in the morning, which started >6 months ago. Not sure if there is a component of plantar fasciitis causing some compensation with prolonged standing and gait activities, which may be causing some muscular pain along LLE chain.  He will benefit from skilled PT to address the above stated deficits to address flexibility, pain, to improve functional mobility.   OBJECTIVE IMPAIRMENTS: difficulty walking, decreased ROM, decreased strength, impaired flexibility, and pain.   ACTIVITY LIMITATIONS: stairs, transfers, and locomotion level  PARTICIPATION LIMITATIONS: community activity and occupation  PERSONAL FACTORS: 1-2 comorbidities: see above are also affecting patient's functional outcome.   REHAB POTENTIAL: Good  CLINICAL DECISION MAKING: Stable/uncomplicated  EVALUATION COMPLEXITY: Low  PLAN:  PT FREQUENCY: 1-2x/week  PT DURATION: other: 5 weeks  PLANNED INTERVENTIONS:  97110-Therapeutic exercises, 97530- Therapeutic activity, [id number]- Neuromuscular re-education, [id number]- Self Care, [id number]- Manual therapy, [id number]- Gait training, [phone]- Electrical stimulation (unattended), Patient/Family education, Cryotherapy, and Moist heat  PLAN FOR NEXT SESSION: Review and progress HEP; manual therapy to IT band; consider foam roller for STM, hip flex  stretch   Ajayla Iglesias W., PT 03/31/2024, 4:58 PM  [facility] at [facility] [address] [place], [address] [place], [place] [address] Phone # [phone] Fax # [phone]

## 2024-04-04 ENCOUNTER — Encounter: Payer: Self-pay | Admitting: Physical Therapy

## 2024-04-04 ENCOUNTER — Ambulatory Visit: Admitting: Physical Therapy

## 2024-04-04 DIAGNOSIS — M79662 Pain in left lower leg: Secondary | ICD-10-CM | POA: Diagnosis not present

## 2024-04-04 DIAGNOSIS — M79605 Pain in left leg: Secondary | ICD-10-CM

## 2024-04-04 NOTE — Therapy (Signed)
 OUTPATIENT PHYSICAL THERAPY NEURO TREATMENT   Patient Name: Donald [name] MRN: [id number] DOB:[birth year], 53 y.o., male Today's Date: 04/04/2024   PCP: Levora Reyes SAUNDERS, MD REFERRING PROVIDER: Emiliano Leonce CROME, PA-C   END OF SESSION:  PT End of Session - 04/04/24 1107     Visit Number 2    Number of Visits 10    Date for Recertification  05/02/24    Authorization Type UHC    PT Start Time 1105    PT Stop Time 1137    PT Time Calculation (min) 32 min    Activity Tolerance Patient tolerated treatment well    Behavior During Therapy WFL for tasks assessed/performed           Past Medical History:  Diagnosis Date   Blood transfusion without reported diagnosis    Chronic bilateral low back pain without sciatica 08/19/2020   DEPRESSION, MILD    Dysuria    G E R D    Hypercholesterolemia    Hyperlipidemia    NOT TAKING MEDICATION   Hypertension    Lumbago    NEPHROLITHIASIS, HX OF    PARASOMNIA    Paroxysmal atrial fibrillation (HCC) 07/10/2022   Prediabetes 08/19/2020   Right bundle branch block    Past Surgical History:  Procedure Laterality Date   CARDIAC CATHETERIZATION N/A 01/19/2016   Procedure: Left Heart Cath and Coronary Angiography;  Surgeon: Ozell Fell, MD;  Location: [facility] INVASIVE CV LAB;  Service: Cardiovascular;  Laterality: N/A;   HAND SURGERY     LEFT HEART CATH AND CORONARY ANGIOGRAPHY N/A 09/13/2023   Procedure: LEFT HEART CATH AND CORONARY ANGIOGRAPHY;  Surgeon: Wendel Lurena POUR, MD;  Location: [facility] INVASIVE CV LAB;  Service: Cardiovascular;  Laterality: N/A;   right and left hand     Patient Active Problem List   Diagnosis Date Noted   Unstable angina (HCC) 09/13/2023   Hypercoagulable state due to paroxysmal atrial fibrillation (HCC) 09/07/2023   CAD (coronary artery disease) 11/19/2022   Chest pain 11/06/2022   Hypocalcemia 11/06/2022   Normocytic anemia 11/06/2022   Paroxysmal atrial fibrillation (HCC) 07/10/2022   Atrial  fibrillation with RVR (HCC) 06/03/2022   Atypical chest pain 04/02/2021   HLD (hyperlipidemia) 08/25/2020   Encounter for general adult medical examination with abnormal findings 08/19/2020   Prediabetes 08/19/2020   Screen for colon cancer 08/19/2020   Diuretic-induced hypokalemia 08/19/2020   Chronic bilateral low back pain without sciatica 08/19/2020   Hypokalemia 01/20/2016   Right bundle branch block 03/11/2010   Essential hypertension 05/14/2009   PARASOMNIA 05/14/2009   G E R D 05/13/2009    ONSET DATE: 03/21/2024 (MD referral); ED visit 02/17/2024  REFERRING DIAG: M79.605 (ICD-10-CM) - Pain in left leg   THERAPY DIAG:  Pain in left lower leg  Pain in left leg  Rationale for Evaluation and Treatment: Rehabilitation  SUBJECTIVE:  SUBJECTIVE STATEMENT: Nothing new.  Pain is about the same Pt accompanied by: self  PERTINENT HISTORY: HTN, GERD, depression, negative for DVT with ultrasound at ED visit 02/2024  PAIN:  Are you having pain? Yes: NPRS scale: 5/10 Pain location: L calf, posterior knee, L posterior thigh Pain description: sharp Aggravating factors: unsure, tends to be worse at night Relieving factors: moving may be helpful, medication (from Dr. Emiliano) has helped Worse pain coming down steps PRECAUTIONS: None  RED FLAGS: None   WEIGHT BEARING RESTRICTIONS: No  FALLS: Has patient fallen in last 6 months? No  LIVING ENVIRONMENT: Lives with: lives with their family Lives in: House/apartment Stairs: 2 flights of steps Has following equipment at home: None  PLOF: Independent and Vocation/Vocational requirements: stands long periods for work  PATIENT GOALS: To help get rid of pain  OBJECTIVE:    TODAY'S TREATMENT: 04/04/2024 Activity Comments  Reviewed stretches  given last visit Pt has discomfort with runner's stretch, so discontinued  Standing gastroc stretch, LLE foot propped at step, 3 x 30 sec Pt prefers this as better stretch  Bilat feet on step with heels down Pt feels too much of a stretch  Seated piriformis stretch, LLE, knee to opposite chest, 3 x 30 sec   Supine hip flexor stretch off bed, 3 x 30 sec Cues for technique, good stretch  Seated ankle pumps 2 x 10   Nustep, level 2, BLEs only x 8 minutes Reports feeling looser after this at end of session  Palpation     Foam roll under L foot x 1 min, to demo ice water bottle roll to try at home Pt tender to palpation at plantar aspect of heel, along L plantar fascia; reports shooting pain in heel first thing in the morning, better during the day  Access Code: [facility] URL: [email or web address] Date: 04/04/2024 Prepared by: [facility] - Outpatient  Rehab - [facility]  Exercises - Seated Hamstring Stretch  - 1-2 x daily - 7 x weekly - 1 sets - 3 reps - 30 sec hold - Standing Gastroc Stretch on Step  - 2 x daily - 7 x weekly - 1 sets - 3 reps - 30 sec hold - Seated Piriformis Stretch  - 1-2 x daily - 7 x weekly - 1 sets - 3 reps - 30 sec hold - Seated Heel Toe Raises  - 1-2 x daily - 7 x weekly - 2 sets - 10 reps - Hip Flexor Stretch at Edge of Bed  - 1-2 x daily - 7 x weekly - 1 sets - 3 reps - 30 sec hold - Seated Plantar Fascia Mobilization with Small Ball  - 1-2 x daily - 7 x weekly - 1 sets - 1-2 min hold  PATIENT EDUCATION: Education details: HEP additions, discussed possible plantar fascia involvement and ways to address with stretching, massage, proper footwear Person educated: Patient Education method: Explanation, Demonstration, and Handouts Education comprehension: verbalized understanding, returned demonstration, and needs further education   Note: Objective measures were completed at Evaluation unless otherwise noted.  DIAGNOSTIC FINDINGS: doppler  negative for DVT  COGNITION: Overall cognitive status: Within functional limits for tasks assessed   SENSATION: Light touch: WFL  COORDINATION: WFL  MUSCLE LENGTH: (tested in supine position) Hamstrings: Right NT deg; Left 0 deg Debby test: Right normal deg; Left -12 deg  POSTURE: No Significant postural limitations  LOWER EXTREMITY ROM:   in sitting, AROM is WFL Supine, SLR is negative  Passive -supine Right Eval  Left Eval  Hip flexion    Hip extension    Hip abduction    Hip adduction    Hip internal rotation    Hip external rotation    Knee flexion    Knee extension  0  Ankle dorsiflexion  12-pain in calf  Ankle plantarflexion    Ankle inversion    Ankle eversion     (Blank rows = not tested)  LOWER EXTREMITY MMT:  5/5 BLE  MMT Right Eval Left Eval  Hip flexion    Hip extension    Hip abduction    Hip adduction    Hip internal rotation    Hip external rotation    Knee flexion    Knee extension    Ankle dorsiflexion    Ankle plantarflexion    Ankle inversion    Ankle eversion    (Blank rows = not tested)  TRANSFERS: Sit to stand: Complete Independence  Assistive device utilized: None     Stand to sit: Complete Independence  Assistive device utilized: None      GAIT: Findings: Gait Characteristics: step through pattern, Distance walked: 50 ft, Assistive device utilized:None, Level of assistance: Modified independence, and Comments: reports pain causes unstable (likely antalgic) pattern of gait at times  FUNCTIONAL TESTS:  5 times sit to stand: 12.09 sec 10 meter walk test: 7.78 sec (4.42 ft/sec) Repeated lumbar flexion:  5/10 in posterior thigh Repeated lumbar extension 5/10 posterior thigh (no change) *No back pain reported throughout PT eval  PALPATION:  Pt is tender to palpation along distal aspect of L IT band; he is tender to palpation along L ant tib muscle belly and posterior knee hamstrings insertion                                                                                                                                TREATMENT DATE: 03/31/2024    PATIENT EDUCATION: Education details: Eval results, POC; HEP initiated-gentle stretch education Person educated: Patient Education method: Explanation, Demonstration, and Handouts Education comprehension: verbalized understanding, returned demonstration, and needs further education  HOME EXERCISE PROGRAM: Access Code: [facility] URL: [email or web address] Date: 03/31/2024 Prepared by: [facility] - Outpatient  Rehab - [facility]  Exercises - Standing Gastroc Stretch at Counter  - 1-2 x daily - 7 x weekly - 1 sets - 3 reps - 30 sec hold - Seated Hamstring Stretch  - 1-2 x daily - 7 x weekly - 1 sets - 3 reps - 30 sec hold  GOALS: Goals reviewed with patient? Yes  SHORT TERM GOALS: Target date: 04/11/2024  Pt will be independent with HEP for improved flexibility, mobility, decreased pain. Baseline: Goal status: INITIAL   LONG TERM GOALS: Target date: 05/02/2024  Pt will be independent with progression of HEP for improved pain, functional mobility. Baseline:  Goal status: INITIAL  2.  Pt will rate 50% reduction in pain in LLE, to improved participation in daily  activities. Baseline: 5/10 Goal status: INITIAL  3.  Pt will improve 5x sit<>stand to less than or equal to 10 sec to demonstrate improved functional strength and transfer efficiency. Baseline: 12.06 sec Goal status: INITIAL  4.  Thomas test LLE to improve to less than 5 degrees, for improved LLE flexibility and decreased pain. Baseline: -12 degrees Goal status: INITIAL  ASSESSMENT:  CLINICAL IMPRESSION: Pt presents today and reports no changes in pain. Skilled PT session focused on review/progression of HEP, to include stretches along LLE.  Switched gastroc stretch to propping foot at step, as this is a better stretch for him.  He feels tight in L hip/IT band as well as L hip  flexors, but able to comfortable stretch in positions provided in session today.  Palpated along L heel and plantar fascia, and given pt's reports, he may have some plantar fascia tightness/fascitis involvement, so discussed ways to address.  Ended session with Nustep, and at end of session, pt reports feeling better.  No c/o at end of session.  EVAL:  Patient is a 53 y.o. male who was seen today for physical therapy evaluation and treatment for leg pain.  Pt reports LLE pain in calf, posterior knee and thigh, which is sharp in nature, and started early September.  He reports this is not a radiating pain and he does not have back pain.  He was seen in ED 02/17/2024 and ultrasound for L calf was negative for DVT.  Assessed lumbar motions, with SLR test negative, repeated lumbar flexion and extension do not increase pain or cause radiation of pain.  Pt does not have pain with resisted MMT motions; he does have pain with palpation at distal IT band, hamstring insertion posterior knee and ant tib muscle proximally, as well as tightness noted in L calf.  With [employer], he is tight L hip flexors.  No specific mechanism of injury or fall reported by patient, though he does report near end of eval that he has has L heel pain upon first getting up in the morning, which started >6 months ago. Not sure if there is a component of plantar fasciitis causing some compensation with prolonged standing and gait activities, which may be causing some muscular pain along LLE chain.  He will benefit from skilled PT to address the above stated deficits to address flexibility, pain, to improve functional mobility.   OBJECTIVE IMPAIRMENTS: difficulty walking, decreased ROM, decreased strength, impaired flexibility, and pain.   ACTIVITY LIMITATIONS: stairs, transfers, and locomotion level  PARTICIPATION LIMITATIONS: community activity and occupation  PERSONAL FACTORS: 1-2 comorbidities: see above are also affecting patient's  functional outcome.   REHAB POTENTIAL: Good  CLINICAL DECISION MAKING: Stable/uncomplicated  EVALUATION COMPLEXITY: Low  PLAN:  PT FREQUENCY: 1-2x/week  PT DURATION: other: 5 weeks  PLANNED INTERVENTIONS: 97110-Therapeutic exercises, 97530- Therapeutic activity, [id number]- Neuromuscular re-education, 97535- Self Care, 02859- Manual therapy, [phone]- Gait training, [phone]- Electrical stimulation (unattended), Patient/Family education, Cryotherapy, and Moist heat  PLAN FOR NEXT SESSION: Review and progress HEP; manual therapy to IT band; consider foam roller for STM   Rick Carruthers W., PT 04/04/2024, 12:19 PM  [place] [facility] at [facility] [address], [address] [place], [place] [address] Phone # [phone] Fax # [phone]

## 2024-04-07 ENCOUNTER — Encounter: Payer: Self-pay | Admitting: Radiology

## 2024-04-07 ENCOUNTER — Ambulatory Visit: Attending: Student

## 2024-04-07 DIAGNOSIS — M79605 Pain in left leg: Secondary | ICD-10-CM | POA: Insufficient documentation

## 2024-04-07 DIAGNOSIS — M79662 Pain in left lower leg: Secondary | ICD-10-CM | POA: Diagnosis present

## 2024-04-07 DIAGNOSIS — R262 Difficulty in walking, not elsewhere classified: Secondary | ICD-10-CM | POA: Insufficient documentation

## 2024-04-07 NOTE — Therapy (Signed)
 OUTPATIENT PHYSICAL THERAPY NEURO TREATMENT   Patient Name: Donald [name] MRN: [id number] DOB:Sep 25, 1970, 53 y.o., male Today's Date: 04/07/2024   PCP: Levora Reyes SAUNDERS, MD REFERRING PROVIDER: Emiliano Leonce CROME, PA-C   END OF SESSION:  PT End of Session - 04/07/24 1558     Visit Number 3    Number of Visits 10    Date for Recertification  05/02/24    Authorization Type [employer]    PT Start Time 1600    PT Stop Time 1645    PT Time Calculation (min) 45 min    Activity Tolerance Patient tolerated treatment well    Behavior During Therapy [facility] for tasks assessed/performed           Past Medical History:  Diagnosis Date   Blood transfusion without reported diagnosis    Chronic bilateral low back pain without sciatica 08/19/2020   DEPRESSION, MILD    Dysuria    G E R D    Hypercholesterolemia    Hyperlipidemia    NOT TAKING MEDICATION   Hypertension    Lumbago    NEPHROLITHIASIS, HX OF    PARASOMNIA    Paroxysmal atrial fibrillation (HCC) 07/10/2022   Prediabetes 08/19/2020   Right bundle branch block    Past Surgical History:  Procedure Laterality Date   CARDIAC CATHETERIZATION N/A 01/19/2016   Procedure: Left Heart Cath and Coronary Angiography;  Surgeon: Ozell Fell, MD;  Location: [facility] INVASIVE CV LAB;  Service: Cardiovascular;  Laterality: N/A;   HAND SURGERY     LEFT HEART CATH AND CORONARY ANGIOGRAPHY N/A 09/13/2023   Procedure: LEFT HEART CATH AND CORONARY ANGIOGRAPHY;  Surgeon: Wendel Lurena POUR, MD;  Location: [facility] INVASIVE CV LAB;  Service: Cardiovascular;  Laterality: N/A;   right and left hand     Patient Active Problem List   Diagnosis Date Noted   Unstable angina (HCC) 09/13/2023   Hypercoagulable state due to paroxysmal atrial fibrillation (HCC) 09/07/2023   CAD (coronary artery disease) 11/19/2022   Chest pain 11/06/2022   Hypocalcemia 11/06/2022   Normocytic anemia 11/06/2022   Paroxysmal atrial fibrillation (HCC) 07/10/2022   Atrial  fibrillation with RVR (HCC) 06/03/2022   Atypical chest pain 04/02/2021   HLD (hyperlipidemia) 08/25/2020   Encounter for general adult medical examination with abnormal findings 08/19/2020   Prediabetes 08/19/2020   Screen for colon cancer 08/19/2020   Diuretic-induced hypokalemia 08/19/2020   Chronic bilateral low back pain without sciatica 08/19/2020   Hypokalemia 01/20/2016   Right bundle branch block 03/11/2010   Essential hypertension 05/14/2009   PARASOMNIA 05/14/2009   G E R D 05/13/2009    ONSET DATE: 03/21/2024 (MD referral); ED visit 02/17/2024  REFERRING DIAG: M79.605 (ICD-10-CM) - Pain in left leg   THERAPY DIAG:  Pain in left lower leg  Pain in left leg  Difficulty in walking, not elsewhere classified  Rationale for Evaluation and Treatment: Rehabilitation  SUBJECTIVE:  SUBJECTIVE STATEMENT: Pain is about the same--indicates medial/lateral of left knee.  Endorses worse pain in AM which is relieved after about 30 min of movement/activity Pt accompanied by: self  PERTINENT HISTORY: HTN, GERD, depression, negative for DVT with ultrasound at ED visit 02/2024  PAIN:  Are you having pain? Yes: NPRS scale: 5/10 Pain location: L calf, posterior knee, L posterior thigh Pain description: sharp Aggravating factors: unsure, tends to be worse at night Relieving factors: moving may be helpful, medication (from Dr. Emiliano) has helped Worse pain coming down steps PRECAUTIONS: None  RED FLAGS: None   WEIGHT BEARING RESTRICTIONS: No  FALLS: Has patient fallen in last 6 months? No  LIVING ENVIRONMENT: Lives with: lives with their family Lives in: House/apartment Stairs: 2 flights of steps Has following equipment at home: None  PLOF: Independent and Vocation/Vocational  requirements: stands long periods for work  PATIENT GOALS: To help get rid of pain  OBJECTIVE:   TODAY'S TREATMENT: 04/07/24 Activity Comments  palpation -tenderness to palpation along medial joint line.  -No ligamentous laxity noted: varus/valgus/post drawer/Lachman's  Recommend use of ice pack around knee at night   SLR 2x10   Bridge 1x10 -w/ ankle PF -w/ ankle DF  Sidelying hip abd 2x10   HEP stretching review       TODAY'S TREATMENT: 04/04/2024 Activity Comments  Reviewed stretches given last visit Pt has discomfort with runner's stretch, so discontinued  Standing gastroc stretch, LLE foot propped at step, 3 x 30 sec Pt prefers this as better stretch  Bilat feet on step with heels down Pt feels too much of a stretch  Seated piriformis stretch, LLE, knee to opposite chest, 3 x 30 sec   Supine hip flexor stretch off bed, 3 x 30 sec Cues for technique, good stretch  Seated ankle pumps 2 x 10   Nustep, level 2, BLEs only x 8 minutes Reports feeling looser after this at end of session  Palpation     Foam roll under L foot x 1 min, to demo ice water bottle roll to try at home Pt tender to palpation at plantar aspect of heel, along L plantar fascia; reports shooting pain in heel first thing in the morning, better during the day  Access Code: [facility] URL: [email or web address] Date: 04/04/2024 Prepared by: [facility] - Outpatient  Rehab - [facility]  Exercises - Seated Hamstring Stretch  - 1-2 x daily - 7 x weekly - 1 sets - 3 reps - 30 sec hold - Standing Gastroc Stretch on Step  - 2 x daily - 7 x weekly - 1 sets - 3 reps - 30 sec hold - Seated Piriformis Stretch  - 1-2 x daily - 7 x weekly - 1 sets - 3 reps - 30 sec hold - Seated Heel Toe Raises  - 1-2 x daily - 7 x weekly - 2 sets - 10 reps - Hip Flexor Stretch at Edge of Bed  - 1-2 x daily - 7 x weekly - 1 sets - 3 reps - 30 sec hold - Seated Plantar Fascia Mobilization with Small Ball  - 1-2 x daily -  7 x weekly - 1 sets - 1-2 min hold  PATIENT EDUCATION: Education details: HEP additions, discussed possible plantar fascia involvement and ways to address with stretching, massage, proper footwear Person educated: Patient Education method: Explanation, Demonstration, and Handouts Education comprehension: verbalized understanding, returned demonstration, and needs further education   Note: Objective measures were completed at Evaluation unless otherwise  noted.  DIAGNOSTIC FINDINGS: doppler negative for DVT  COGNITION: Overall cognitive status: Within functional limits for tasks assessed   SENSATION: Light touch: WFL  COORDINATION: WFL  MUSCLE LENGTH: (tested in supine position) Hamstrings: Right NT deg; Left 0 deg Debby test: Right normal deg; Left -12 deg  POSTURE: No Significant postural limitations  LOWER EXTREMITY ROM:   in sitting, AROM is WFL Supine, SLR is negative  Passive -supine Right Eval Left Eval  Hip flexion    Hip extension    Hip abduction    Hip adduction    Hip internal rotation    Hip external rotation    Knee flexion  120+  Knee extension  0  Ankle dorsiflexion  12-pain in calf  Ankle plantarflexion    Ankle inversion    Ankle eversion     (Blank rows = not tested)  LOWER EXTREMITY MMT:  5/5 BLE  MMT Right Eval Left Eval  Hip flexion    Hip extension    Hip abduction    Hip adduction    Hip internal rotation    Hip external rotation    Knee flexion    Knee extension    Ankle dorsiflexion    Ankle plantarflexion    Ankle inversion    Ankle eversion    (Blank rows = not tested)  TRANSFERS: Sit to stand: Complete Independence  Assistive device utilized: None     Stand to sit: Complete Independence  Assistive device utilized: None      GAIT: Findings: Gait Characteristics: step through pattern, Distance walked: 50 ft, Assistive device utilized:None, Level of assistance: Modified independence, and Comments: reports pain causes  unstable (likely antalgic) pattern of gait at times  FUNCTIONAL TESTS:  5 times sit to stand: 12.09 sec 10 meter walk test: 7.78 sec (4.42 ft/sec) Repeated lumbar flexion:  5/10 in posterior thigh Repeated lumbar extension 5/10 posterior thigh (no change) *No back pain reported throughout PT eval  PALPATION:  Pt is tender to palpation along distal aspect of L IT band; he is tender to palpation along L ant tib muscle belly and posterior knee hamstrings insertion                                                                                                                               TREATMENT DATE: 03/31/2024    PATIENT EDUCATION: Education details: Eval results, POC; HEP initiated-gentle stretch education Person educated: Patient Education method: Explanation, Demonstration, and Handouts Education comprehension: verbalized understanding, returned demonstration, and needs further education  HOME EXERCISE PROGRAM: Access Code: [facility] URL: [email or web address] Date: 03/31/2024 Prepared by: [facility] - Outpatient  Rehab - [facility]  Exercises - Standing Gastroc Stretch at Counter  - 1-2 x daily - 7 x weekly - 1 sets - 3 reps - 30 sec hold - Seated Hamstring Stretch  - 1-2 x daily - 7 x weekly - 1 sets - 3 reps - 30 sec  hold  GOALS: Goals reviewed with patient? Yes  SHORT TERM GOALS: Target date: 04/11/2024  Pt will be independent with HEP for improved flexibility, mobility, decreased pain. Baseline: Goal status: INITIAL   LONG TERM GOALS: Target date: 05/02/2024  Pt will be independent with progression of HEP for improved pain, functional mobility. Baseline:  Goal status: INITIAL  2.  Pt will rate 50% reduction in pain in LLE, to improved participation in daily activities. Baseline: 5/10 Goal status: INITIAL  3.  Pt will improve 5x sit<>stand to less than or equal to 10 sec to demonstrate improved functional strength and transfer  efficiency. Baseline: 12.06 sec Goal status: INITIAL  4.  Thomas test LLE to improve to less than 5 degrees, for improved LLE flexibility and decreased pain. Baseline: -12 degrees Goal status: INITIAL  ASSESSMENT:  CLINICAL IMPRESSION: Pt notes diffuse area around medial/lateral left knee joint and endorses worse pain in AM which relieves somewhat after 30 min movement/activity.  Exhibits some tenderness to medial joint line, no obvious ligamentous instability noted.  Good depth to bodyweight squat without provocation. Instructed in open chain strength activities to improve left knee function and review of HEP for static stretching to improve flexibility.  EVAL:  Patient is a 53 y.o. male who was seen today for physical therapy evaluation and treatment for leg pain.  Pt reports LLE pain in calf, posterior knee and thigh, which is sharp in nature, and started early September.  He reports this is not a radiating pain and he does not have back pain.  He was seen in ED 02/17/2024 and ultrasound for L calf was negative for DVT.  Assessed lumbar motions, with SLR test negative, repeated lumbar flexion and extension do not increase pain or cause radiation of pain.  Pt does not have pain with resisted MMT motions; he does have pain with palpation at distal IT band, hamstring insertion posterior knee and ant tib muscle proximally, as well as tightness noted in L calf.  With [employer], he is tight L hip flexors.  No specific mechanism of injury or fall reported by patient, though he does report near end of eval that he has has L heel pain upon first getting up in the morning, which started >6 months ago. Not sure if there is a component of plantar fasciitis causing some compensation with prolonged standing and gait activities, which may be causing some muscular pain along LLE chain.  He will benefit from skilled PT to address the above stated deficits to address flexibility, pain, to improve functional mobility.    OBJECTIVE IMPAIRMENTS: difficulty walking, decreased ROM, decreased strength, impaired flexibility, and pain.   ACTIVITY LIMITATIONS: stairs, transfers, and locomotion level  PARTICIPATION LIMITATIONS: community activity and occupation  PERSONAL FACTORS: 1-2 comorbidities: see above are also affecting patient's functional outcome.   REHAB POTENTIAL: Good  CLINICAL DECISION MAKING: Stable/uncomplicated  EVALUATION COMPLEXITY: Low  PLAN:  PT FREQUENCY: 1-2x/week  PT DURATION: other: 5 weeks  PLANNED INTERVENTIONS: 97110-Therapeutic exercises, 97530- Therapeutic activity, [id number]- Neuromuscular re-education, 97535- Self Care, [id number]- Manual therapy, [phone]- Gait training, [phone]- Electrical stimulation (unattended), Patient/Family education, Cryotherapy, and Moist heat  PLAN FOR NEXT SESSION: Review and progress HEP; manual therapy to IT band; consider foam roller for STM   Jonette MARLA Sandifer, PT 04/07/2024, 3:58 PM  [facility] at [facility] [address] [place], [place] [address] Phone # [phone] Fax # [phone]

## 2024-04-10 ENCOUNTER — Other Ambulatory Visit: Payer: Self-pay | Admitting: Cardiovascular Disease

## 2024-04-10 DIAGNOSIS — I4891 Unspecified atrial fibrillation: Secondary | ICD-10-CM

## 2024-04-10 MED ORDER — APIXABAN 5 MG PO TABS: 5.0000 mg | ORAL_TABLET | Freq: Two times a day (BID) | ORAL | 1 refills | Status: AC

## 2024-04-10 NOTE — Telephone Encounter (Signed)
 Prescription refill request for Eliquis  received. Indication:afib Last office visit:4/25 Scr:0.93  9/25 Age: 53 Weight:106.5  kg  Prescription refilled

## 2024-04-14 ENCOUNTER — Ambulatory Visit

## 2024-04-21 ENCOUNTER — Ambulatory Visit: Admitting: Physical Therapy

## 2024-04-21 NOTE — Therapy (Incomplete)
 OUTPATIENT PHYSICAL THERAPY NEURO TREATMENT   Patient Name: Donald [name] MRN: [id number] DOB:02-Jan-1971, 53 y.o., male Today's Date: 04/21/2024   PCP: Levora Reyes SAUNDERS, MD REFERRING PROVIDER: Emiliano Leonce CROME, PA-C   END OF SESSION:     Past Medical History:  Diagnosis Date   Blood transfusion without reported diagnosis    Chronic bilateral low back pain without sciatica 08/19/2020   DEPRESSION, MILD    Dysuria    G E R D    Hypercholesterolemia    Hyperlipidemia    NOT TAKING MEDICATION   Hypertension    Lumbago    NEPHROLITHIASIS, HX OF    PARASOMNIA    Paroxysmal atrial fibrillation (HCC) 07/10/2022   Prediabetes 08/19/2020   Right bundle branch block    Past Surgical History:  Procedure Laterality Date   CARDIAC CATHETERIZATION N/A 01/19/2016   Procedure: Left Heart Cath and Coronary Angiography;  Surgeon: Ozell Fell, MD;  Location: [facility] INVASIVE CV LAB;  Service: Cardiovascular;  Laterality: N/A;   HAND SURGERY     LEFT HEART CATH AND CORONARY ANGIOGRAPHY N/A 09/13/2023   Procedure: LEFT HEART CATH AND CORONARY ANGIOGRAPHY;  Surgeon: Wendel Lurena POUR, MD;  Location: [facility] INVASIVE CV LAB;  Service: Cardiovascular;  Laterality: N/A;   right and left hand     Patient Active Problem List   Diagnosis Date Noted   Unstable angina (HCC) 09/13/2023   Hypercoagulable state due to paroxysmal atrial fibrillation (HCC) 09/07/2023   CAD (coronary artery disease) 11/19/2022   Chest pain 11/06/2022   Hypocalcemia 11/06/2022   Normocytic anemia 11/06/2022   Paroxysmal atrial fibrillation (HCC) 07/10/2022   Atrial fibrillation with RVR (HCC) 06/03/2022   Atypical chest pain 04/02/2021   HLD (hyperlipidemia) 08/25/2020   Encounter for general adult medical examination with abnormal findings 08/19/2020   Prediabetes 08/19/2020   Screen for colon cancer 08/19/2020   Diuretic-induced hypokalemia 08/19/2020   Chronic bilateral low back pain without sciatica  08/19/2020   Hypokalemia 01/20/2016   Right bundle branch block 03/11/2010   Essential hypertension 05/14/2009   PARASOMNIA 05/14/2009   G E R D 05/13/2009    ONSET DATE: 03/21/2024 (MD referral); ED visit 02/17/2024  REFERRING DIAG: M79.605 (ICD-10-CM) - Pain in left leg   THERAPY DIAG:  No diagnosis found.  Rationale for Evaluation and Treatment: Rehabilitation  SUBJECTIVE:                                                                                                                                                                                             SUBJECTIVE STATEMENT: ***Pain is about the same--indicates medial/lateral of left  knee.  Endorses worse pain in AM which is relieved after about 30 min of movement/activity Pt accompanied by: self  PERTINENT HISTORY: HTN, GERD, depression, negative for DVT with ultrasound at ED visit 02/2024  PAIN:  Are you having pain? Yes: NPRS scale: 5/10 Pain location: L calf, posterior knee, L posterior thigh Pain description: sharp Aggravating factors: unsure, tends to be worse at night Relieving factors: moving may be helpful, medication (from Dr. Emiliano) has helped Worse pain coming down steps PRECAUTIONS: None  RED FLAGS: None   WEIGHT BEARING RESTRICTIONS: No  FALLS: Has patient fallen in last 6 months? No  LIVING ENVIRONMENT: Lives with: lives with their family Lives in: House/apartment Stairs: 2 flights of steps Has following equipment at home: None  PLOF: Independent and Vocation/Vocational requirements: stands long periods for work  PATIENT GOALS: To help get rid of pain  OBJECTIVE:    TODAY'S TREATMENT: 04/21/2024 Activity Comments                      TODAY'S TREATMENT: 04/07/24 Activity Comments  palpation -tenderness to palpation along medial joint line.  -No ligamentous laxity noted: varus/valgus/post drawer/Lachman's  Recommend use of ice pack around knee at night   SLR 2x10   Bridge 1x10  -w/ ankle PF -w/ ankle DF  Sidelying hip abd 2x10   HEP stretching review       TODAY'S TREATMENT: 04/04/2024 Activity Comments  Reviewed stretches given last visit Pt has discomfort with runner's stretch, so discontinued  Standing gastroc stretch, LLE foot propped at step, 3 x 30 sec Pt prefers this as better stretch  Bilat feet on step with heels down Pt feels too much of a stretch  Seated piriformis stretch, LLE, knee to opposite chest, 3 x 30 sec   Supine hip flexor stretch off bed, 3 x 30 sec Cues for technique, good stretch  Seated ankle pumps 2 x 10   Nustep, level 2, BLEs only x 8 minutes Reports feeling looser after this at end of session  Palpation     Foam roll under L foot x 1 min, to demo ice water bottle roll to try at home Pt tender to palpation at plantar aspect of heel, along L plantar fascia; reports shooting pain in heel first thing in the morning, better during the day  Access Code: [facility] URL: [email or web address] Date: 04/04/2024 Prepared by: [facility] - Outpatient  Rehab - [facility]  Exercises - Seated Hamstring Stretch  - 1-2 x daily - 7 x weekly - 1 sets - 3 reps - 30 sec hold - Standing Gastroc Stretch on Step  - 2 x daily - 7 x weekly - 1 sets - 3 reps - 30 sec hold - Seated Piriformis Stretch  - 1-2 x daily - 7 x weekly - 1 sets - 3 reps - 30 sec hold - Seated Heel Toe Raises  - 1-2 x daily - 7 x weekly - 2 sets - 10 reps - Hip Flexor Stretch at Edge of Bed  - 1-2 x daily - 7 x weekly - 1 sets - 3 reps - 30 sec hold - Seated Plantar Fascia Mobilization with Small Ball  - 1-2 x daily - 7 x weekly - 1 sets - 1-2 min hold  PATIENT EDUCATION: Education details: HEP additions, discussed possible plantar fascia involvement and ways to address with stretching, massage, proper footwear Person educated: Patient Education method: Explanation, Demonstration, and Handouts Education comprehension: verbalized understanding, returned  demonstration, and needs further education   Note: Objective measures were completed at Evaluation unless otherwise noted.  DIAGNOSTIC FINDINGS: doppler negative for DVT  COGNITION: Overall cognitive status: Within functional limits for tasks assessed   SENSATION: Light touch: WFL  COORDINATION: WFL  MUSCLE LENGTH: (tested in supine position) Hamstrings: Right NT deg; Left 0 deg Debby test: Right normal deg; Left -12 deg  POSTURE: No Significant postural limitations  LOWER EXTREMITY ROM:   in sitting, AROM is WFL Supine, SLR is negative  Passive -supine Right Eval Left Eval  Hip flexion    Hip extension    Hip abduction    Hip adduction    Hip internal rotation    Hip external rotation    Knee flexion  120+  Knee extension  0  Ankle dorsiflexion  12-pain in calf  Ankle plantarflexion    Ankle inversion    Ankle eversion     (Blank rows = not tested)  LOWER EXTREMITY MMT:  5/5 BLE  MMT Right Eval Left Eval  Hip flexion    Hip extension    Hip abduction    Hip adduction    Hip internal rotation    Hip external rotation    Knee flexion    Knee extension    Ankle dorsiflexion    Ankle plantarflexion    Ankle inversion    Ankle eversion    (Blank rows = not tested)  TRANSFERS: Sit to stand: Complete Independence  Assistive device utilized: None     Stand to sit: Complete Independence  Assistive device utilized: None      GAIT: Findings: Gait Characteristics: step through pattern, Distance walked: 50 ft, Assistive device utilized:None, Level of assistance: Modified independence, and Comments: reports pain causes unstable (likely antalgic) pattern of gait at times  FUNCTIONAL TESTS:  5 times sit to stand: 12.09 sec 10 meter walk test: 7.78 sec (4.42 ft/sec) Repeated lumbar flexion:  5/10 in posterior thigh Repeated lumbar extension 5/10 posterior thigh (no change) *No back pain reported throughout PT eval  PALPATION:  Pt is tender to palpation along  distal aspect of L IT band; he is tender to palpation along L ant tib muscle belly and posterior knee hamstrings insertion                                                                                                                               TREATMENT DATE: 03/31/2024    PATIENT EDUCATION: Education details: Eval results, POC; HEP initiated-gentle stretch education Person educated: Patient Education method: Explanation, Demonstration, and Handouts Education comprehension: verbalized understanding, returned demonstration, and needs further education  HOME EXERCISE PROGRAM: Access Code: [facility] URL: [email or web address] Date: 03/31/2024 Prepared by: [facility] - Outpatient  Rehab - [facility]  Exercises - Standing Gastroc Stretch at Counter  - 1-2 x daily - 7 x weekly - 1 sets - 3 reps - 30 sec hold - Seated Hamstring Stretch  -  1-2 x daily - 7 x weekly - 1 sets - 3 reps - 30 sec hold  GOALS: Goals reviewed with patient? Yes  SHORT TERM GOALS: Target date: 04/11/2024  Pt will be independent with HEP for improved flexibility, mobility, decreased pain. Baseline: Goal status: IN PROGRESS   LONG TERM GOALS: Target date: 05/02/2024  Pt will be independent with progression of HEP for improved pain, functional mobility. Baseline:  Goal status: IN PROGRESS  2.  Pt will rate 50% reduction in pain in LLE, to improved participation in daily activities. Baseline: 5/10 Goal status: IN PROGRESS  3.  Pt will improve 5x sit<>stand to less than or equal to 10 sec to demonstrate improved functional strength and transfer efficiency. Baseline: 12.06 sec Goal status: IN PROGRESS  4.  Thomas test LLE to improve to less than 5 degrees, for improved LLE flexibility and decreased pain. Baseline: -12 degrees Goal status: IN PROGRESS  ASSESSMENT:  CLINICAL IMPRESSION: Pt presents today ***. Skilled PT session focused on ***. Pt needs ***. Pt will continue to  benefit from skilled PT towards goals for improved functional mobility and decreased fall risk.   Pt notes diffuse area around medial/lateral left knee joint and endorses worse pain in AM which relieves somewhat after 30 min movement/activity.  Exhibits some tenderness to medial joint line, no obvious ligamentous instability noted.  Good depth to bodyweight squat without provocation. Instructed in open chain strength activities to improve left knee function and review of HEP for static stretching to improve flexibility.  EVAL:  Patient is a 53 y.o. male who was seen today for physical therapy evaluation and treatment for leg pain.  Pt reports LLE pain in calf, posterior knee and thigh, which is sharp in nature, and started early September.  He reports this is not a radiating pain and he does not have back pain.  He was seen in ED 02/17/2024 and ultrasound for L calf was negative for DVT.  Assessed lumbar motions, with SLR test negative, repeated lumbar flexion and extension do not increase pain or cause radiation of pain.  Pt does not have pain with resisted MMT motions; he does have pain with palpation at distal IT band, hamstring insertion posterior knee and ant tib muscle proximally, as well as tightness noted in L calf.  With [employer], he is tight L hip flexors.  No specific mechanism of injury or fall reported by patient, though he does report near end of eval that he has has L heel pain upon first getting up in the morning, which started >6 months ago. Not sure if there is a component of plantar fasciitis causing some compensation with prolonged standing and gait activities, which may be causing some muscular pain along LLE chain.  He will benefit from skilled PT to address the above stated deficits to address flexibility, pain, to improve functional mobility.   OBJECTIVE IMPAIRMENTS: difficulty walking, decreased ROM, decreased strength, impaired flexibility, and pain.   ACTIVITY LIMITATIONS:  stairs, transfers, and locomotion level  PARTICIPATION LIMITATIONS: community activity and occupation  PERSONAL FACTORS: 1-2 comorbidities: see above are also affecting patient's functional outcome.   REHAB POTENTIAL: Good  CLINICAL DECISION MAKING: Stable/uncomplicated  EVALUATION COMPLEXITY: Low  PLAN:  PT FREQUENCY: 1-2x/week  PT DURATION: other: 5 weeks  PLANNED INTERVENTIONS: 97110-Therapeutic exercises, 97530- Therapeutic activity, [id number]- Neuromuscular re-education, 97535- Self Care, [id number]- Manual therapy, [id number]- Gait training, [phone]- Electrical stimulation (unattended), Patient/Family education, Cryotherapy, and Moist heat  PLAN FOR NEXT SESSION: ***Review and  progress HEP; manual therapy to IT band; consider foam roller for STM   Quamaine Webb W., PT 04/21/2024, 8:53 AM  [facility] at [facility] [address] [place], [address] [place], [place] [address] Phone # [phone] Fax # [phone]

## 2024-04-28 ENCOUNTER — Ambulatory Visit: Admitting: Physical Therapy

## 2024-04-28 ENCOUNTER — Ambulatory Visit (INDEPENDENT_AMBULATORY_CARE_PROVIDER_SITE_OTHER): Admitting: Family Medicine

## 2024-04-28 ENCOUNTER — Telehealth: Payer: Self-pay | Admitting: Physical Therapy

## 2024-04-28 ENCOUNTER — Encounter: Payer: Self-pay | Admitting: Family Medicine

## 2024-04-28 VITALS — BP 136/70 | HR 74 | Temp 97.9°F | Resp 15 | Ht 74.0 in | Wt 232.8 lb

## 2024-04-28 DIAGNOSIS — I10 Essential (primary) hypertension: Secondary | ICD-10-CM

## 2024-04-28 DIAGNOSIS — Z0000 Encounter for general adult medical examination without abnormal findings: Secondary | ICD-10-CM | POA: Diagnosis not present

## 2024-04-28 DIAGNOSIS — I480 Paroxysmal atrial fibrillation: Secondary | ICD-10-CM | POA: Diagnosis not present

## 2024-04-28 DIAGNOSIS — E782 Mixed hyperlipidemia: Secondary | ICD-10-CM | POA: Diagnosis not present

## 2024-04-28 DIAGNOSIS — Z1329 Encounter for screening for other suspected endocrine disorder: Secondary | ICD-10-CM | POA: Diagnosis not present

## 2024-04-28 DIAGNOSIS — Z125 Encounter for screening for malignant neoplasm of prostate: Secondary | ICD-10-CM | POA: Diagnosis not present

## 2024-04-28 DIAGNOSIS — E785 Hyperlipidemia, unspecified: Secondary | ICD-10-CM | POA: Diagnosis not present

## 2024-04-28 DIAGNOSIS — R7303 Prediabetes: Secondary | ICD-10-CM

## 2024-04-28 LAB — COMPREHENSIVE METABOLIC PANEL WITH GFR
ALT: 11 U/L (ref 0–53)
AST: 15 U/L (ref 0–37)
Albumin: 4.4 g/dL (ref 3.5–5.2)
Alkaline Phosphatase: 48 U/L (ref 39–117)
BUN: 13 mg/dL (ref 6–23)
CO2: 30 meq/L (ref 19–32)
Calcium: 9.4 mg/dL (ref 8.4–10.5)
Chloride: 103 meq/L (ref 96–112)
Creatinine, Ser: 0.88 mg/dL (ref 0.40–1.50)
GFR: 96.59 mL/min (ref >60.00)
Glucose, Bld: 84 mg/dL (ref 70–99)
Potassium: 4.2 meq/L (ref 3.5–5.1)
Sodium: 139 meq/L (ref 135–145)
Total Bilirubin: 0.6 mg/dL (ref 0.2–1.2)
Total Protein: 7.2 g/dL (ref 6.0–8.3)

## 2024-04-28 LAB — LIPID PANEL
Cholesterol: 206 mg/dL — ABNORMAL HIGH (ref 0–200)
HDL: 49.6 mg/dL (ref >39.00)
LDL Cholesterol: 142 mg/dL — ABNORMAL HIGH (ref 0–99)
NonHDL: 156.6
Total CHOL/HDL Ratio: 4
Triglycerides: 72 mg/dL (ref 0.0–149.0)
VLDL: 14.4 mg/dL (ref 0.0–40.0)

## 2024-04-28 LAB — CBC
HCT: 41 % (ref 39.0–52.0)
Hemoglobin: 13.7 g/dL (ref 13.0–17.0)
MCHC: 33.5 g/dL (ref 30.0–36.0)
MCV: 84.4 fl (ref 78.0–100.0)
Platelets: 307 K/uL (ref 150.0–400.0)
RBC: 4.86 Mil/uL (ref 4.22–5.81)
RDW: 15.1 % (ref 11.5–15.5)
WBC: 4.7 K/uL (ref 4.0–10.5)

## 2024-04-28 LAB — TSH: TSH: 1.51 u[IU]/mL (ref 0.35–5.50)

## 2024-04-28 LAB — PSA: PSA: 0.28 ng/mL (ref 0.10–4.00)

## 2024-04-28 LAB — HEMOGLOBIN A1C: Hgb A1c MFr Bld: 5.7 % (ref 4.6–6.5)

## 2024-04-28 MED ORDER — HYDROCHLOROTHIAZIDE 25 MG PO TABS: 25.0000 mg | ORAL_TABLET | Freq: Every day | ORAL | 1 refills | Status: AC

## 2024-04-28 NOTE — Patient Instructions (Signed)
 Thank you for coming in today. No change in medications at this time. If there are any concerns on your bloodwork, I will let you know. Take care! Preventive Care 55-55 Years Old, Male Preventive care refers to lifestyle choices and visits with your health care provider that can promote health and wellness. Preventive care visits are also called wellness exams. What can I expect for my preventive care visit? Counseling During your preventive care visit, your health care provider may ask about your: Medical history, including: Past medical problems. Family medical history. Current health, including: Emotional well-being. Home life and relationship well-being. Sexual activity. Lifestyle, including: Alcohol, nicotine or tobacco, and drug use. Access to firearms. Diet, exercise, and sleep habits. Safety issues such as seatbelt and bike helmet use. Sunscreen use. Work and work Astronomer. Physical exam Your health care provider will check your: Height and weight. These may be used to calculate your BMI (body mass index). BMI is a measurement that tells if you are at a healthy weight. Waist circumference. This measures the distance around your waistline. This measurement also tells if you are at a healthy weight and may help predict your risk of certain diseases, such as type 2 diabetes and high blood pressure. Heart rate and blood pressure. Body temperature. Skin for abnormal spots. What immunizations do I need?  Vaccines are usually given at various ages, according to a schedule. Your health care provider will recommend vaccines for you based on your age, medical history, and lifestyle or other factors, such as travel or where you work. What tests do I need? Screening Your health care provider may recommend screening tests for certain conditions. This may include: Lipid and cholesterol levels. Diabetes screening. This is done by checking your blood sugar (glucose) after you have not  eaten for a while (fasting). Hepatitis B test. Hepatitis C test. HIV (human immunodeficiency virus) test. STI (sexually transmitted infection) testing, if you are at risk. Lung cancer screening. Prostate cancer screening. Colorectal cancer screening. Talk with your health care provider about your test results, treatment options, and if necessary, the need for more tests. Follow these instructions at home: Eating and drinking  Eat a diet that includes fresh fruits and vegetables, whole grains, lean protein, and low-fat dairy products. Take vitamin and mineral supplements as recommended by your health care provider. Do not drink alcohol if your health care provider tells you not to drink. If you drink alcohol: Limit how much you have to 0-2 drinks a day. Know how much alcohol is in your drink. In the [place] one drink equals one 12 oz bottle of beer (355 mL), one 5 oz glass of wine (148 mL), or one 1 oz glass of hard liquor (44 mL). Lifestyle Brush your teeth every morning and night with fluoride toothpaste. Floss one time each day. Exercise for at least 30 minutes 5 or more days each week. Do not use any products that contain nicotine or tobacco. These products include cigarettes, chewing tobacco, and vaping devices, such as e-cigarettes. If you need help quitting, ask your health care provider. Do not use drugs. If you are sexually active, practice safe sex. Use a condom or other form of protection to prevent STIs. Take aspirin  only as told by your health care provider. Make sure that you understand how much to take and what form to take. Work with your health care provider to find out whether it is safe and beneficial for you to take aspirin  daily. Find healthy ways to manage  stress, such as: Meditation, yoga, or listening to music. Journaling. Talking to a trusted person. Spending time with friends and family. Minimize exposure to UV radiation to reduce your risk of skin  cancer. Safety Always wear your seat belt while driving or riding in a vehicle. Do not drive: If you have been drinking alcohol. Do not ride with someone who has been drinking. When you are tired or distracted. While texting. If you have been using any mind-altering substances or drugs. Wear a helmet and other protective equipment during sports activities. If you have firearms in your house, make sure you follow all gun safety procedures. What's next? Go to your health care provider once a year for an annual wellness visit. Ask your health care provider how often you should have your eyes and teeth checked. Stay up to date on all vaccines. This information is not intended to replace advice given to you by your health care provider. Make sure you discuss any questions you have with your health care provider. Document Revised: 11/17/2020 Document Reviewed: 11/17/2020 Elsevier Patient Education  2024 [employer].

## 2024-04-28 NOTE — Therapy (Incomplete)
 OUTPATIENT PHYSICAL THERAPY NEURO TREATMENT   Patient Name: Donald [name] MRN: [id number] DOB:1971/01/01, 53 y.o., male Today's Date: 04/28/2024   PCP: Levora Reyes SAUNDERS, MD REFERRING PROVIDER: Emiliano Leonce CROME, PA-C   END OF SESSION:     Past Medical History:  Diagnosis Date   Blood transfusion without reported diagnosis    Chronic bilateral low back pain without sciatica 08/19/2020   DEPRESSION, MILD    Dysuria    G E R D    Hypercholesterolemia    Hyperlipidemia    NOT TAKING MEDICATION   Hypertension    Lumbago    NEPHROLITHIASIS, HX OF    PARASOMNIA    Paroxysmal atrial fibrillation (HCC) 07/10/2022   Prediabetes 08/19/2020   Right bundle branch block    Past Surgical History:  Procedure Laterality Date   CARDIAC CATHETERIZATION N/A 01/19/2016   Procedure: Left Heart Cath and Coronary Angiography;  Surgeon: Ozell Fell, MD;  Location: [facility] INVASIVE CV LAB;  Service: Cardiovascular;  Laterality: N/A;   HAND SURGERY     LEFT HEART CATH AND CORONARY ANGIOGRAPHY N/A 09/13/2023   Procedure: LEFT HEART CATH AND CORONARY ANGIOGRAPHY;  Surgeon: Wendel Lurena POUR, MD;  Location: [facility] INVASIVE CV LAB;  Service: Cardiovascular;  Laterality: N/A;   right and left hand     Patient Active Problem List   Diagnosis Date Noted   Unstable angina (HCC) 09/13/2023   Hypercoagulable state due to paroxysmal atrial fibrillation (HCC) 09/07/2023   CAD (coronary artery disease) 11/19/2022   Chest pain 11/06/2022   Hypocalcemia 11/06/2022   Normocytic anemia 11/06/2022   Paroxysmal atrial fibrillation (HCC) 07/10/2022   Atrial fibrillation with RVR (HCC) 06/03/2022   Atypical chest pain 04/02/2021   HLD (hyperlipidemia) 08/25/2020   Encounter for general adult medical examination with abnormal findings 08/19/2020   Prediabetes 08/19/2020   Screen for colon cancer 08/19/2020   Diuretic-induced hypokalemia 08/19/2020   Chronic bilateral low back pain without sciatica  08/19/2020   Hypokalemia 01/20/2016   Right bundle branch block 03/11/2010   Essential hypertension 05/14/2009   PARASOMNIA 05/14/2009   G E R D 05/13/2009    ONSET DATE: 03/21/2024 (MD referral); ED visit 02/17/2024  REFERRING DIAG: M79.605 (ICD-10-CM) - Pain in left leg   THERAPY DIAG:  No diagnosis found.  Rationale for Evaluation and Treatment: Rehabilitation  SUBJECTIVE:                                                                                                                                                                                             SUBJECTIVE STATEMENT: ***Pain is about the same--indicates medial/lateral of left  knee.  Endorses worse pain in AM which is relieved after about 30 min of movement/activity Pt accompanied by: self  PERTINENT HISTORY: HTN, GERD, depression, negative for DVT with ultrasound at ED visit 02/2024  PAIN:  Are you having pain? Yes: NPRS scale: 5/10 Pain location: L calf, posterior knee, L posterior thigh Pain description: sharp Aggravating factors: unsure, tends to be worse at night Relieving factors: moving may be helpful, medication (from Dr. Emiliano) has helped Worse pain coming down steps PRECAUTIONS: None  RED FLAGS: None   WEIGHT BEARING RESTRICTIONS: No  FALLS: Has patient fallen in last 6 months? No  LIVING ENVIRONMENT: Lives with: lives with their family Lives in: House/apartment Stairs: 2 flights of steps Has following equipment at home: None  PLOF: Independent and Vocation/Vocational requirements: stands long periods for work  PATIENT GOALS: To help get rid of pain  OBJECTIVE:    TODAY'S TREATMENT: 04/28/2024 Activity Comments                      TODAY'S TREATMENT: 04/07/24 Activity Comments  palpation -tenderness to palpation along medial joint line.  -No ligamentous laxity noted: varus/valgus/post drawer/Lachman's  Recommend use of ice pack around knee at night   SLR 2x10   Bridge 1x10  -w/ ankle PF -w/ ankle DF  Sidelying hip abd 2x10   HEP stretching review       TODAY'S TREATMENT: 04/04/2024 Activity Comments  Reviewed stretches given last visit Pt has discomfort with runner's stretch, so discontinued  Standing gastroc stretch, LLE foot propped at step, 3 x 30 sec Pt prefers this as better stretch  Bilat feet on step with heels down Pt feels too much of a stretch  Seated piriformis stretch, LLE, knee to opposite chest, 3 x 30 sec   Supine hip flexor stretch off bed, 3 x 30 sec Cues for technique, good stretch  Seated ankle pumps 2 x 10   Nustep, level 2, BLEs only x 8 minutes Reports feeling looser after this at end of session  Palpation     Foam roll under L foot x 1 min, to demo ice water bottle roll to try at home Pt tender to palpation at plantar aspect of heel, along L plantar fascia; reports shooting pain in heel first thing in the morning, better during the day  Access Code: [facility] URL: [email or web address] Date: 04/04/2024 Prepared by: [facility] - Outpatient  Rehab - [facility]  Exercises - Seated Hamstring Stretch  - 1-2 x daily - 7 x weekly - 1 sets - 3 reps - 30 sec hold - Standing Gastroc Stretch on Step  - 2 x daily - 7 x weekly - 1 sets - 3 reps - 30 sec hold - Seated Piriformis Stretch  - 1-2 x daily - 7 x weekly - 1 sets - 3 reps - 30 sec hold - Seated Heel Toe Raises  - 1-2 x daily - 7 x weekly - 2 sets - 10 reps - Hip Flexor Stretch at Edge of Bed  - 1-2 x daily - 7 x weekly - 1 sets - 3 reps - 30 sec hold - Seated Plantar Fascia Mobilization with Small Ball  - 1-2 x daily - 7 x weekly - 1 sets - 1-2 min hold  PATIENT EDUCATION: Education details: HEP additions, discussed possible plantar fascia involvement and ways to address with stretching, massage, proper footwear Person educated: Patient Education method: Explanation, Demonstration, and Handouts Education comprehension: verbalized understanding, returned  demonstration, and needs further education   Note: Objective measures were completed at Evaluation unless otherwise noted.  DIAGNOSTIC FINDINGS: doppler negative for DVT  COGNITION: Overall cognitive status: Within functional limits for tasks assessed   SENSATION: Light touch: WFL  COORDINATION: WFL  MUSCLE LENGTH: (tested in supine position) Hamstrings: Right NT deg; Left 0 deg Debby test: Right normal deg; Left -12 deg  POSTURE: No Significant postural limitations  LOWER EXTREMITY ROM:   in sitting, AROM is WFL Supine, SLR is negative  Passive -supine Right Eval Left Eval  Hip flexion    Hip extension    Hip abduction    Hip adduction    Hip internal rotation    Hip external rotation    Knee flexion  120+  Knee extension  0  Ankle dorsiflexion  12-pain in calf  Ankle plantarflexion    Ankle inversion    Ankle eversion     (Blank rows = not tested)  LOWER EXTREMITY MMT:  5/5 BLE  MMT Right Eval Left Eval  Hip flexion    Hip extension    Hip abduction    Hip adduction    Hip internal rotation    Hip external rotation    Knee flexion    Knee extension    Ankle dorsiflexion    Ankle plantarflexion    Ankle inversion    Ankle eversion    (Blank rows = not tested)  TRANSFERS: Sit to stand: Complete Independence  Assistive device utilized: None     Stand to sit: Complete Independence  Assistive device utilized: None      GAIT: Findings: Gait Characteristics: step through pattern, Distance walked: 50 ft, Assistive device utilized:None, Level of assistance: Modified independence, and Comments: reports pain causes unstable (likely antalgic) pattern of gait at times  FUNCTIONAL TESTS:  5 times sit to stand: 12.09 sec 10 meter walk test: 7.78 sec (4.42 ft/sec) Repeated lumbar flexion:  5/10 in posterior thigh Repeated lumbar extension 5/10 posterior thigh (no change) *No back pain reported throughout PT eval  PALPATION:  Pt is tender to palpation along  distal aspect of L IT band; he is tender to palpation along L ant tib muscle belly and posterior knee hamstrings insertion                                                                                                                               TREATMENT DATE: 03/31/2024    PATIENT EDUCATION: Education details: Eval results, POC; HEP initiated-gentle stretch education Person educated: Patient Education method: Explanation, Demonstration, and Handouts Education comprehension: verbalized understanding, returned demonstration, and needs further education  HOME EXERCISE PROGRAM: Access Code: [facility] URL: [email or web address] Date: 03/31/2024 Prepared by: [facility] - Outpatient  Rehab - [facility]  Exercises - Standing Gastroc Stretch at Counter  - 1-2 x daily - 7 x weekly - 1 sets - 3 reps - 30 sec hold - Seated Hamstring Stretch  -  1-2 x daily - 7 x weekly - 1 sets - 3 reps - 30 sec hold  GOALS: Goals reviewed with patient? Yes  SHORT TERM GOALS: Target date: 04/11/2024  Pt will be independent with HEP for improved flexibility, mobility, decreased pain. Baseline: Goal status: IN PROGRESS   LONG TERM GOALS: Target date: 05/02/2024  Pt will be independent with progression of HEP for improved pain, functional mobility. Baseline:  Goal status: IN PROGRESS  2.  Pt will rate 50% reduction in pain in LLE, to improved participation in daily activities. Baseline: 5/10 Goal status: IN PROGRESS  3.  Pt will improve 5x sit<>stand to less than or equal to 10 sec to demonstrate improved functional strength and transfer efficiency. Baseline: 12.06 sec Goal status: IN PROGRESS  4.  Thomas test LLE to improve to less than 5 degrees, for improved LLE flexibility and decreased pain. Baseline: -12 degrees Goal status: IN PROGRESS  ASSESSMENT:  CLINICAL IMPRESSION: Pt presents today ***. Skilled PT session focused on ***. Pt needs ***. Pt will continue to  benefit from skilled PT towards goals for improved functional mobility and decreased fall risk.   Pt notes diffuse area around medial/lateral left knee joint and endorses worse pain in AM which relieves somewhat after 30 min movement/activity.  Exhibits some tenderness to medial joint line, no obvious ligamentous instability noted.  Good depth to bodyweight squat without provocation. Instructed in open chain strength activities to improve left knee function and review of HEP for static stretching to improve flexibility.  EVAL:  Patient is a 53 y.o. male who was seen today for physical therapy evaluation and treatment for leg pain.  Pt reports LLE pain in calf, posterior knee and thigh, which is sharp in nature, and started early September.  He reports this is not a radiating pain and he does not have back pain.  He was seen in ED 02/17/2024 and ultrasound for L calf was negative for DVT.  Assessed lumbar motions, with SLR test negative, repeated lumbar flexion and extension do not increase pain or cause radiation of pain.  Pt does not have pain with resisted MMT motions; he does have pain with palpation at distal IT band, hamstring insertion posterior knee and ant tib muscle proximally, as well as tightness noted in L calf.  With [employer], he is tight L hip flexors.  No specific mechanism of injury or fall reported by patient, though he does report near end of eval that he has has L heel pain upon first getting up in the morning, which started >6 months ago. Not sure if there is a component of plantar fasciitis causing some compensation with prolonged standing and gait activities, which may be causing some muscular pain along LLE chain.  He will benefit from skilled PT to address the above stated deficits to address flexibility, pain, to improve functional mobility.   OBJECTIVE IMPAIRMENTS: difficulty walking, decreased ROM, decreased strength, impaired flexibility, and pain.   ACTIVITY LIMITATIONS:  stairs, transfers, and locomotion level  PARTICIPATION LIMITATIONS: community activity and occupation  PERSONAL FACTORS: 1-2 comorbidities: see above are also affecting patient's functional outcome.   REHAB POTENTIAL: Good  CLINICAL DECISION MAKING: Stable/uncomplicated  EVALUATION COMPLEXITY: Low  PLAN:  PT FREQUENCY: 1-2x/week  PT DURATION: other: 5 weeks  PLANNED INTERVENTIONS: 97110-Therapeutic exercises, 97530- Therapeutic activity, [id number]- Neuromuscular re-education, 97535- Self Care, [id number]- Manual therapy, [id number]- Gait training, [phone]- Electrical stimulation (unattended), Patient/Family education, Cryotherapy, and Moist heat  PLAN FOR NEXT SESSION: ***Review and  progress HEP; manual therapy to IT band; consider foam roller for STM   Josilyn Shippee W., PT 04/28/2024, 11:55 AM  [facility] Re[facility] at [facility] [address] [place], [address] [place], [place] [address] Phone # [phone] Fax # [phone]

## 2024-04-28 NOTE — Telephone Encounter (Signed)
 Attempted to contact patient regarding 2 missed PT appointments.  Unable to leave VM or speak with patient today.  He currently has no further appointments scheduled.  Greig Anon, PT 04/28/24 3:09 PM Phone: [phone] Fax: [phone]

## 2024-04-28 NOTE — Progress Notes (Signed)
 Subjective:  Patient ID: Donald [name], male    DOB: [birth year]  Age: 53 y.o. MRN: [id number]  CC:  Chief Complaint  Patient presents with   Annual Exam    No questions or concerns.     HPI Donald [name] presents for Annual Exam  PCP, [facility], Dr. Delford.  Visit with cardiology in April with Jackee Alberts, NP.  History of nonobstructive CAD, paroxysmal A-fib, RBBB, SVT, HTN, HLD, seen for posthospital follow-up of chest pain.  Event monitor placed for evaluation of A-fib after palpitations noted in April.  He was seen in the ED on April 10 with chest pain.  EKG was sinus tachycardia, RBBB with ST elevation.  Left heart cath with mild nonobstructive disease and no acute occlusions.  TTE with EF of 7075% without regional wall motion abnormalities and mild concentric LVH. Had not required any nitroglycerin  -as as needed.  Continue Eliquis  5 mg twice daily for paroxysmal A-fib.  Rate controlled.  Event monitor with less than 0.1% A-fib burden, predominantly sinus rhythm.  Continued on verapamil  120 mg daily with as needed 40 mg for breakthrough palpitations.  Losartan  100 mg was changed to valsartan  160 mg daily and continued HCTZ 25 mg daily for hypertension.  Crestor  20 mg daily for hyperlipidemia. Ortho, [facility].  Left leg pain.  Has undergone physical therapy - still some pain, still working with ortho - plans to follow up with ortho.   Hypertension: With nonobstructive CAD, A-fib as above.  Denies any side effects or new bleeding with his anticoagulation, HCTZ, verapamil , valsartan , isosorbide  15 mg.  Home readings: usually 125/78 range.  BP Readings from Last 3 Encounters:  04/28/24 136/70  02/17/24 (!) 181/88  10/22/23 118/70   Lab Results  Component Value Date   CREATININE 0.93 02/17/2024    Hyperlipidemia: Crestor  20 mg daily, no new myalgias.  Lab Results  Component Value Date   CHOL 134 11/26/2023   HDL 50.50 11/26/2023   LDLCALC 73  11/26/2023   TRIG 51.0 11/26/2023   CHOLHDL 3 11/26/2023   Lab Results  Component Value Date   ALT 12 02/17/2024   AST 18 02/17/2024   ALKPHOS 59 02/17/2024   BILITOT 0.3 02/17/2024   Prediabetes: A1c up to 6.0 last November, improved at his April visit.  Weight is stable and down a few pounds from May. Watching diet, walking for exercise.  Lab Results  Component Value Date   HGBA1C 5.6 09/13/2023   Wt Readings from Last 3 Encounters:  04/28/24 232 lb 12.8 oz (105.6 kg)  10/22/23 234 lb 12.8 oz (106.5 kg)  09/27/23 238 lb 3.2 oz (108 kg)       04/28/2024    9:29 AM 10/22/2023    9:26 AM 04/23/2023    8:32 AM 10/09/2022    8:18 AM 07/10/2022   10:15 AM  Depression screen PHQ 2/9  Decreased Interest 0 0 0 0 0  Down, Depressed, Hopeless 0 0 0 0 0  PHQ - 2 Score 0 0 0 0 0  Altered sleeping 1 1 1 1    Tired, decreased energy 0 0 1 1   Change in appetite 0 0 0 0   Feeling bad or failure about yourself  0 0 0 0   Trouble concentrating 0 0 0 0   Moving slowly or fidgety/restless 0 0 0 0   Suicidal thoughts 0 0 0 0   PHQ-9 Score 1 1  2   2  Difficult doing work/chores Not difficult at all Not difficult at all Somewhat difficult Not difficult at all      Data saved with a previous flowsheet row definition    Health Maintenance  Topic Date Due   COVID-19 Vaccine (3 - 2025-26 season) 05/14/2024 (Originally 02/04/2024)   Zoster Vaccines- Shingrix (1 of 2) 07/29/2024 (Originally 06/29/2018)   Influenza Vaccine  09/02/2024 (Originally 01/04/2024)   Pneumococcal Vaccine: 50+ Years (1 of 2 - PCV) 04/28/2025 (Originally 06/30/1987)   Hepatitis B Vaccines 19-59 Average Risk (1 of 3 - 19+ 3-dose series) 04/28/2025 (Originally 06/30/1987)   DTaP/Tdap/Td (3 - Td or Tdap) 03/03/2027   Colonoscopy  01/25/2028   Hepatitis C Screening  Completed   HIV Screening  Completed   HPV VACCINES  Aged Out   Meningococcal B Vaccine  Aged Out  Colonoscopy 01/24/2021.  Tubular adenoma with planned repeat in  7 years. Prostate: does not have family history of prostate cancer The natural history of prostate cancer and ongoing controversy regarding screening and potential treatment outcomes of prostate cancer has been discussed with the patient. The meaning of a false positive PSA and a false negative PSA has been discussed. He indicates understanding of the limitations of this screening test and wishes to proceed with screening PSA testing. Lab Results  Component Value Date   PSA1 0.4 01/15/2019   PSA1 0.4 03/02/2017   PSA 0.27 05/01/2022   PSA 0.28 08/19/2020   PSA 0.39 05/17/2015    Immunization History  Administered Date(s) Administered   Influenza Whole 03/09/2010   Influenza,inj,Quad PF,6+ Mos 03/02/2017, 01/15/2019, 03/05/2020, 06/04/2022   PFIZER(Purple Top)SARS-COV-2 Vaccination 02/28/2020, 03/23/2020   Td 03/09/2010   Tdap 03/02/2017  COVID booster, shingles vaccine, flu vaccine, pneumonia vaccine, hepatitis B vaccine: declines.    No results found. Optho - due. Needs to schedule appt.   Dental: every 6 months.   Alcohol: none  Tobacco: none  Exercise: walking.    History Patient Active Problem List   Diagnosis Date Noted   Unstable angina (HCC) 09/13/2023   Hypercoagulable state due to paroxysmal atrial fibrillation (HCC) 09/07/2023   CAD (coronary artery disease) 11/19/2022   Chest pain 11/06/2022   Hypocalcemia 11/06/2022   Normocytic anemia 11/06/2022   Paroxysmal atrial fibrillation (HCC) 07/10/2022   Atrial fibrillation with RVR (HCC) 06/03/2022   Atypical chest pain 04/02/2021   HLD (hyperlipidemia) 08/25/2020   Encounter for general adult medical examination with abnormal findings 08/19/2020   Prediabetes 08/19/2020   Screen for colon cancer 08/19/2020   Diuretic-induced hypokalemia 08/19/2020   Chronic bilateral low back pain without sciatica 08/19/2020   Hypokalemia 01/20/2016   Right bundle branch block 03/11/2010   Essential hypertension 05/14/2009    PARASOMNIA 05/14/2009   G E R D 05/13/2009   Past Medical History:  Diagnosis Date   Blood transfusion without reported diagnosis    Chronic bilateral low back pain without sciatica 08/19/2020   DEPRESSION, MILD    Dysuria    G E R D    Hypercholesterolemia    Hyperlipidemia    NOT TAKING MEDICATION   Hypertension    Lumbago    NEPHROLITHIASIS, HX OF    PARASOMNIA    Paroxysmal atrial fibrillation (HCC) 07/10/2022   Prediabetes 08/19/2020   Right bundle branch block    Past Surgical History:  Procedure Laterality Date   CARDIAC CATHETERIZATION N/A 01/19/2016   Procedure: Left Heart Cath and Coronary Angiography;  Surgeon: Ozell Fell, MD;  [facility] INVASIVE  CV LAB;  Service: Cardiovascular;  Laterality: N/A;   HAND SURGERY     LEFT HEART CATH AND CORONARY ANGIOGRAPHY N/A 09/13/2023   Procedure: LEFT HEART CATH AND CORONARY ANGIOGRAPHY;  Surgeon: Wendel Lurena POUR, MD;  Location: [facility] INVASIVE CV LAB;  Service: Cardiovascular;  Laterality: N/A;   right and left hand     Allergies  Allergen Reactions   Atorvastatin  Other (See Comments)    Muscle pain   Zestril  [Lisinopril ] Cough   Ace Inhibitors Cough   Porcine (Pork) Protein-Containing Drug Products Other (See Comments)    Per religious beliefs - consented to heparin  products 09/13/23   Prior to Admission medications   Medication Sig Start Date End Date Taking? Authorizing Provider  acetaminophen  (TYLENOL ) 500 MG tablet Take 500 mg by mouth daily as needed for moderate pain (pain score 4-6) or headache.   Yes [provider]  apixaban  (ELIQUIS ) 5 MG TABS tablet Take 1 tablet (5 mg total) by mouth 2 (two) times daily. 04/10/24  Yes Nishan, Peter C, MD  b complex vitamins capsule Take 1 capsule by mouth daily.   Yes [provider]  Cholecalciferol  (VITAMIN D -3 PO) Take 1 tablet by mouth daily.   Yes [provider]  Cyanocobalamin (VITAMIN B-12 PO) Take 1 tablet by mouth daily.   Yes [provider]  cyclobenzaprine  (FLEXERIL ) 10 MG tablet Take 1 tablet (10 mg total) by mouth 2 (two) times daily as needed for muscle spasms. 02/17/24  Yes Curatolo, Adam, DO  hydrochlorothiazide  (HYDRODIURIL ) 25 MG tablet Take 1 tablet (25 mg total) by mouth daily. 10/22/23  Yes Levora Donald SAUNDERS, MD  isosorbide  mononitrate (IMDUR ) 30 MG 24 hr tablet Take 0.5 tablets (15 mg total) by mouth daily. Can take an additional half tab as needed for chest pain Patient taking differently: Take 30 mg by mouth daily as needed (chest pain). 03/26/23  Yes Wyn Jackee VEAR Mickey., NP  MAGNESIUM PO Take 1 tablet by mouth daily.   Yes [provider]  Multiple Vitamins-Minerals (ZINC PO) Take 1 tablet by mouth daily.   Yes [provider]  nitroGLYCERIN  (NITROSTAT ) 0.4 MG SL tablet Place 1 tablet (0.4 mg total) under the tongue every 5 (five) minutes as needed. 09/27/23 04/28/24 Yes Wyn Jackee VEAR Mickey., NP  pantoprazole  (PROTONIX ) 40 MG tablet Take 1 tablet (40 mg total) by mouth daily as needed (heartburn). 09/14/23  Yes Henry Shaver B, NP  POTASSIUM PO Take 1 tablet by mouth daily. OTC   Yes [provider]  rosuvastatin  (CRESTOR ) 20 MG tablet Take 1 tablet (20 mg total) by mouth daily. 09/14/23 09/13/24 Yes Henry Shaver B, NP  valsartan  (DIOVAN ) 160 MG tablet TAKE 1 TABLET BY MOUTH EVERY DAY 03/25/24  Yes Wyn Jackee VEAR Mickey., NP  verapamil  (CALAN -SR) 120 MG CR tablet TAKE 1 TABLET BY MOUTH AT BEDTIME. 06/21/23  Yes Delford Maude BROCKS, MD  methylPREDNISolone  (MEDROL  DOSEPAK) 4 MG TBPK tablet Take per packet instructions Patient not taking: Reported on 04/28/2024 03/21/24   Emiliano Leonce CROME, PA-C  verapamil  (CALAN ) 40 MG tablet TAKE 1 TABLET (40 MG TOTAL) BY MOUTH DAILY AS NEEDED (BREAKTHROUGH CHEST PAIN). Patient not taking: Reported on 04/28/2024 04/24/23   Wyn Jackee VEAR Mickey., NP   Social History   Socioeconomic History   Marital status: Married    Spouse name: Not on file   Number of  children: 6   Years of education: 15   Highest education level: Not on file  Occupational History   Occupation: Chief Of Staff: [employer]  Tobacco Use   Smoking status: Former    Types: Cigarettes   Smokeless tobacco: Never   Tobacco comments:    Former smoker 09/07/23  Vaping Use   Vaping status: Never Used  Substance and Sexual Activity   Alcohol use: No    Alcohol/week: 0.0 standard drinks of alcohol   Drug use: No   Sexual activity: Not Currently  Other Topics Concern   Not on file  Social History Narrative   ** Merged History Encounter **       Social Drivers of Health   Financial Resource Strain: Not on file  Food Insecurity: No Food Insecurity (09/14/2023)   Hunger Vital Sign    Worried About Running Out of Food in the Last Year: Never true    Ran Out of Food in the Last Year: Never true  Transportation Needs: No Transportation Needs (09/14/2023)   PRAPARE - Administrator, Civil Service (Medical): No    Lack of Transportation (Non-Medical): No  Physical Activity: Not on file  Stress: Not on file  Social Connections: Unknown (10/18/2021)   Received from [facility]   Social Network    Social Network: Not on file  Intimate Partner Violence: Not At Risk (09/14/2023)   Humiliation, Afraid, Rape, and Kick questionnaire    Fear of Current or Ex-Partner: No    Emotionally Abused: No    Physically Abused: No    Sexually Abused: No    Review of Systems 13 point review of systems per patient health survey noted.  Negative other than as indicated above or in HPI.   Objective:   Vitals:   04/28/24 0926  BP: 136/70  Pulse: 74  Resp: 15  Temp: 97.9 F (36.6 C)  TempSrc: Temporal  SpO2: 100%  Weight: 232 lb 12.8 oz (105.6 kg)  Height: 6' 2 (1.88 m)     Physical Exam Vitals reviewed.  Constitutional:      Appearance: He is well-developed.  HENT:     Head: Normocephalic and atraumatic.     Right Ear: External ear normal.      Left Ear: External ear normal.  Eyes:     Conjunctiva/sclera: Conjunctivae normal.     Pupils: Pupils are equal, round, and reactive to light.  Neck:     Thyroid : No thyromegaly.  Cardiovascular:     Rate and Rhythm: Normal rate and regular rhythm.     Heart sounds: Normal heart sounds.  Pulmonary:     Effort: Pulmonary effort is normal. No respiratory distress.     Breath sounds: Normal breath sounds. No wheezing.  Abdominal:     General: There is no distension.     Palpations: Abdomen is soft.     Tenderness: There is no abdominal tenderness.  Musculoskeletal:        General: No tenderness. Normal range of motion.     Cervical back: Normal range of motion and neck supple.  Lymphadenopathy:     Cervical: No cervical adenopathy.  Skin:    General: Skin is warm and dry.  Neurological:     Mental Status: He is alert and oriented to person, place, and time.     Deep Tendon Reflexes: Reflexes are normal and symmetric.  Psychiatric:        Behavior: Behavior normal.        Assessment & Plan:  Donald [name] is a 53 y.o.  male . Annual physical exam  --anticipatory guidance as below in AVS, screening labs above. Health maintenance items as above in HPI discussed/recommended as applicable.   Screening for thyroid  disorder - Plan: TSH   Mixed hyperlipidemia - Plan: Comprehensive metabolic panel with GFR, Lipid panel Hyperlipidemia, unspecified hyperlipidemia type  - Tolerating current dose of statin, check labs and adjust plan accordingly  Essential (primary) hypertension - Plan: hydrochlorothiazide  (HYDRODIURIL ) 25 MG tablet  - Appears to be overall stable on home readings as well as in office and tolerating current med regimen.  No changes for now.  Follow-up with cardiology as planned.  Paroxysmal atrial fibrillation (HCC) - Plan: TSH, CBC  - Asymptomatic, on anticoagulation as above without new bleeding or side effects.  Reassuring monitor results with low A-fib  burden.  No med changes, follow-up with cardiology as planned.  Prediabetes - Plan: Hemoglobin A1c  - Check A1c and adjust plan accordingly.  Commended on weight loss.  Within normal range on last testing.  Essential hypertension - Plan: Comprehensive metabolic panel with GFR  - As above  Screening for malignant neoplasm of prostate - Plan: PSA   Meds ordered this encounter  Medications   hydrochlorothiazide  (HYDRODIURIL ) 25 MG tablet    Sig: Take 1 tablet (25 mg total) by mouth daily.    Dispense:  90 tablet    Refill:  1   Patient Instructions  Thank you for coming in today. No change in medications at this time. If there are any concerns on your bloodwork, I will let you know. Take care!  Preventive Care 72-56 Years Old, Male Preventive care refers to lifestyle choices and visits with your health care provider that can promote health and wellness. Preventive care visits are also called wellness exams. What can I expect for my preventive care visit? Counseling During your preventive care visit, your health care provider may ask about your: Medical history, including: Past medical problems. Family medical history. Current health, including: Emotional well-being. Home life and relationship well-being. Sexual activity. Lifestyle, including: Alcohol, nicotine or tobacco, and drug use. Access to firearms. Diet, exercise, and sleep habits. Safety issues such as seatbelt and bike helmet use. Sunscreen use. Work and work astronomer. Physical exam Your health care provider will check your: Height and weight. These may be used to calculate your BMI (body mass index). BMI is a measurement that tells if you are at a healthy weight. Waist circumference. This measures the distance around your waistline. This measurement also tells if you are at a healthy weight and may help predict your risk of certain diseases, such as type 2 diabetes and high blood pressure. Heart rate and blood  pressure. Body temperature. Skin for abnormal spots. What immunizations do I need?  Vaccines are usually given at various ages, according to a schedule. Your health care provider will recommend vaccines for you based on your age, medical history, and lifestyle or other factors, such as travel or where you work. What tests do I need? Screening Your health care provider may recommend screening tests for certain conditions. This may include: Lipid and cholesterol levels. Diabetes screening. This is done by checking your blood sugar (glucose) after you have not eaten for a while (fasting). Hepatitis B test. Hepatitis C test. HIV (human immunodeficiency virus) test. STI (sexually transmitted infection) testing, if you are at risk. Lung cancer screening. Prostate cancer screening. Colorectal cancer screening. Talk with your health care provider about your test results, treatment options, and if necessary, the  need for more tests. Follow these instructions at home: Eating and drinking  Eat a diet that includes fresh fruits and vegetables, whole grains, lean protein, and low-fat dairy products. Take vitamin and mineral supplements as recommended by your health care provider. Do not drink alcohol if your health care provider tells you not to drink. If you drink alcohol: Limit how much you have to 0-2 drinks a day. Know how much alcohol is in your drink. In the [place] one drink equals one 12 oz bottle of beer (355 mL), one 5 oz glass of wine (148 mL), or one 1 oz glass of hard liquor (44 mL). Lifestyle Brush your teeth every morning and night with fluoride toothpaste. Floss one time each day. Exercise for at least 30 minutes 5 or more days each week. Do not use any products that contain nicotine or tobacco. These products include cigarettes, chewing tobacco, and vaping devices, such as e-cigarettes. If you need help quitting, ask your health care provider. Do not use drugs. If you are sexually  active, practice safe sex. Use a condom or other form of protection to prevent STIs. Take aspirin  only as told by your health care provider. Make sure that you understand how much to take and what form to take. Work with your health care provider to find out whether it is safe and beneficial for you to take aspirin  daily. Find healthy ways to manage stress, such as: Meditation, yoga, or listening to music. Journaling. Talking to a trusted person. Spending time with friends and family. Minimize exposure to UV radiation to reduce your risk of skin cancer. Safety Always wear your seat belt while driving or riding in a vehicle. Do not drive: If you have been drinking alcohol. Do not ride with someone who has been drinking. When you are tired or distracted. While texting. If you have been using any mind-altering substances or drugs. Wear a helmet and other protective equipment during sports activities. If you have firearms in your house, make sure you follow all gun safety procedures. What's next? Go to your health care provider once a year for an annual wellness visit. Ask your health care provider how often you should have your eyes and teeth checked. Stay up to date on all vaccines. This information is not intended to replace advice given to you by your health care provider. Make sure you discuss any questions you have with your health care provider. Document Revised: 11/17/2020 Document Reviewed: 11/17/2020 Elsevier Patient Education  2024 Elsevier Inc.    Signed,   Donald Pines, MD [facility], [facility] 04/28/24 10:01 AM

## 2024-05-04 ENCOUNTER — Ambulatory Visit: Payer: Self-pay | Admitting: Family Medicine

## 2024-06-16 ENCOUNTER — Other Ambulatory Visit: Payer: Self-pay | Admitting: Cardiovascular Disease

## 2024-06-16 MED ORDER — VERAPAMIL HCL ER 120 MG PO TBCR: 120.0000 mg | EXTENDED_RELEASE_TABLET | Freq: Every day | ORAL | 3 refills | Status: AC

## 2024-06-17 ENCOUNTER — Emergency Department (HOSPITAL_COMMUNITY)
Admission: EM | Admit: 2024-06-17 | Discharge: 2024-06-17 | Disposition: A | Discharge status: Home / Self Care | Attending: Emergency Medicine | Admitting: Emergency Medicine

## 2024-06-17 ENCOUNTER — Emergency Department (HOSPITAL_COMMUNITY)

## 2024-06-17 DIAGNOSIS — R059 Cough, unspecified: Secondary | ICD-10-CM | POA: Diagnosis not present

## 2024-06-17 DIAGNOSIS — I10 Essential (primary) hypertension: Secondary | ICD-10-CM | POA: Diagnosis not present

## 2024-06-17 DIAGNOSIS — R0789 Other chest pain: Secondary | ICD-10-CM | POA: Diagnosis present

## 2024-06-17 DIAGNOSIS — I482 Chronic atrial fibrillation, unspecified: Secondary | ICD-10-CM | POA: Insufficient documentation

## 2024-06-17 DIAGNOSIS — Z79899 Other long term (current) drug therapy: Secondary | ICD-10-CM | POA: Insufficient documentation

## 2024-06-17 DIAGNOSIS — Z7901 Long term (current) use of anticoagulants: Secondary | ICD-10-CM | POA: Insufficient documentation

## 2024-06-17 LAB — TROPONIN T, HIGH SENSITIVITY
Troponin T High Sensitivity: <15 ng/L (ref 0–19)
Troponin T High Sensitivity: <15 ng/L (ref 0–19)

## 2024-06-17 LAB — BASIC METABOLIC PANEL WITH GFR
Anion gap: 11 (ref 5–15)
BUN: 14 mg/dL (ref 6–20)
CO2: 23 mmol/L (ref 22–32)
Calcium: 9 mg/dL (ref 8.9–10.3)
Chloride: 103 mmol/L (ref 98–111)
Creatinine, Ser: 0.92 mg/dL (ref 0.61–1.24)
GFR, Estimated: >60 mL/min
Glucose, Bld: 147 mg/dL — ABNORMAL HIGH (ref 70–99)
Potassium: 3.6 mmol/L (ref 3.5–5.1)
Sodium: 137 mmol/L (ref 135–145)

## 2024-06-17 LAB — CBC
HCT: 40.2 % (ref 39.0–52.0)
Hemoglobin: 13.3 g/dL (ref 13.0–17.0)
MCH: 28.3 pg (ref 26.0–34.0)
MCHC: 33.1 g/dL (ref 30.0–36.0)
MCV: 85.5 fL (ref 80.0–100.0)
Platelets: 336 K/uL (ref 150–400)
RBC: 4.7 MIL/uL (ref 4.22–5.81)
RDW: 13.6 % (ref 11.5–15.5)
WBC: 6.1 K/uL (ref 4.0–10.5)
nRBC: 0 % (ref 0.0–0.2)

## 2024-06-17 MED ORDER — CYCLOBENZAPRINE HCL 10 MG PO TABS: 10.0000 mg | ORAL_TABLET | Freq: Two times a day (BID) | ORAL | 0 refills | Status: AC | PRN

## 2024-06-17 MED ORDER — LIDOCAINE 5 % EX PTCH: 1.0000 | MEDICATED_PATCH | CUTANEOUS | 0 refills | Status: AC

## 2024-06-17 NOTE — ED Provider Triage Note (Signed)
 Emergency Medicine Provider Triage Evaluation Note  Serafin Decatur , a 54 y.o. male  was evaluated in triage.  Pt complains of rapid heart rate and vague left-sided chest discomfort.  Symptoms are now completely resolved.  EMS activated a STEMI alert in the field.  STEMI alert canceled on arrival.  EKG is without acute change.  Patient's last cardiac cath was in April 2025.  Mild, nonobstructive disease with no acute occlusion was seen.  EMS administered 324 mg of aspirin  and 1 dose of sublingual nitroglycerin .  Review of Systems  Positive: Chest pain, palpitations Negative: Nausea, vomiting,  Physical Exam  BP 132/74   Pulse 99   Temp 98 F (36.7 C)   Resp 16   SpO2 98%  Gen:   Awake, no distress   Resp:  Normal effort  MSK:   Moves extremities without difficulty  Other:    Medical Decision Making  Medically screening exam initiated at 4:15 PM.  Appropriate orders placed.  Kylie Gros was informed that the remainder of the evaluation will be completed by another provider, this initial triage assessment does not replace that evaluation, and the importance of remaining in the ED until their evaluation is complete.     Laurice Maude BROCKS, MD 06/17/24 1616

## 2024-06-17 NOTE — Discharge Instructions (Signed)
 Your history, exam, workup today were overall reassuring.  Your EKG appears similar to prior and your heart enzymes were negative both times we checked them.  The x-ray did not show pneumonia and the rest of your labs were similar to prior and reassuring.  You have proven stability for over 6 hours and we feel you are safe for discharge home.  As you suspected, we do think it may related to some musculoskeletal chest wall pain and spasm after the coughing fits you have had over the last week.  Please call and follow-up with your primary doctor and your cardiologist and if any symptoms change or worsen acutely, please return to the nearest emergency department.

## 2024-06-17 NOTE — ED Triage Notes (Signed)
 Patient BIB [facility] from home for CP starting around 1200, coworkers state he became diaphoretic and clammy but was warm and dry on EMS arrival. EMS transmitted EKG for STEMI but was cancelled. EMS gave 324mg  aspirin  and 1 dose NGL, 18 R AC BP 190/80, improved with NGL HR 92 100% RA RR 16

## 2024-06-17 NOTE — ED Notes (Signed)
 Per STEMI RN, patient's EKG remains unchanged from previous in April when he had a heart cath performed and it was clean at that time. Patient reports pain feels like bad heartburn.

## 2024-06-17 NOTE — ED Provider Notes (Signed)
 " [facility] Provider Note   CSN: [id number] Arrival date & time: 06/17/24  1422     Patient presents with: Chest Pain   Donald [name] is a 54 y.o. male.   The history is provided by the patient and medical records. No language interpreter was used.  Chest Pain Pain location:  Substernal area and L chest Pain quality: aching and dull   Pain radiates to:  Does not radiate Pain severity:  Moderate Onset quality:  Gradual Timing:  Unable to specify Progression:  Partially resolved Chronicity:  New Relieved by:  Nothing Worsened by:  Nothing Ineffective treatments:  None tried Associated symptoms: cough, diaphoresis and palpitations   Associated symptoms: no abdominal pain, no altered mental status, no back pain, no claudication, no fatigue, no headache, no lower extremity edema, no nausea, no near-syncope, no numbness, no shortness of breath and no vomiting        Prior to Admission medications  Medication Sig Start Date End Date Taking? Authorizing Provider  acetaminophen  (TYLENOL ) 500 MG tablet Take 500 mg by mouth daily as needed for moderate pain (pain score 4-6) or headache.    [provider]  apixaban  (ELIQUIS ) 5 MG TABS tablet Take 1 tablet (5 mg total) by mouth 2 (two) times daily. 04/10/24   Nishan, Peter C, MD  b complex vitamins capsule Take 1 capsule by mouth daily.    [provider]  Cholecalciferol  (VITAMIN D -3 PO) Take 1 tablet by mouth daily.    [provider]  Cyanocobalamin (VITAMIN B-12 PO) Take 1 tablet by mouth daily.    [provider]  cyclobenzaprine  (FLEXERIL ) 10 MG tablet Take 1 tablet (10 mg total) by mouth 2 (two) times daily as needed for muscle spasms. 02/17/24   Curatolo, Adam, DO  hydrochlorothiazide  (HYDRODIURIL ) 25 MG tablet Take 1 tablet (25 mg total) by mouth daily. 04/28/24   Levora Reyes SAUNDERS, MD  isosorbide  mononitrate (IMDUR ) 30 MG 24 hr tablet Take 0.5  tablets (15 mg total) by mouth daily. Can take an additional half tab as needed for chest pain Patient taking differently: Take 30 mg by mouth daily as needed (chest pain). 03/26/23   Wyn Jackee VEAR Mickey., NP  MAGNESIUM PO Take 1 tablet by mouth daily.    [provider]  Multiple Vitamins-Minerals (ZINC PO) Take 1 tablet by mouth daily.    [provider]  nitroGLYCERIN  (NITROSTAT ) 0.4 MG SL tablet Place 1 tablet (0.4 mg total) under the tongue every 5 (five) minutes as needed. 09/27/23 04/28/24  Wyn Jackee VEAR Mickey., NP  pantoprazole  (PROTONIX ) 40 MG tablet Take 1 tablet (40 mg total) by mouth daily as needed (heartburn). 09/14/23   Henry Manuelita NOVAK, NP  POTASSIUM PO Take 1 tablet by mouth daily. OTC    [provider]  rosuvastatin  (CRESTOR ) 20 MG tablet Take 1 tablet (20 mg total) by mouth daily. 09/14/23 09/13/24  Henry Manuelita NOVAK, NP  valsartan  (DIOVAN ) 160 MG tablet TAKE 1 TABLET BY MOUTH EVERY DAY 03/25/24   Dick, Ernest H Jr., NP  verapamil  (CALAN ) 40 MG tablet TAKE 1 TABLET (40 MG TOTAL) BY MOUTH DAILY AS NEEDED (BREAKTHROUGH CHEST PAIN). Patient not taking: Reported on 04/28/2024 04/24/23   Wyn Jackee VEAR Mickey., NP  verapamil  (CALAN -SR) 120 MG CR tablet Take 1 tablet (120 mg total) by mouth at bedtime. 06/16/24   Delford Maude BROCKS, MD    Allergies: Atorvastatin , Zestril  [lisinopril ], Ace inhibitors, and  Porcine (pork) protein-containing drug products    Review of Systems  Constitutional:  Positive for diaphoresis. Negative for chills and fatigue.  HENT:  Negative for congestion.   Respiratory:  Positive for cough. Negative for chest tightness, shortness of breath, wheezing and stridor.   Cardiovascular:  Positive for chest pain and palpitations. Negative for claudication, leg swelling and near-syncope.  Gastrointestinal:  Negative for abdominal pain, constipation, diarrhea, nausea and vomiting.  Genitourinary:  Negative for dysuria and frequency.  Musculoskeletal:   Negative for back pain, neck pain and neck stiffness.  Skin:  Negative for rash and wound.  Neurological:  Negative for light-headedness, numbness and headaches.  Psychiatric/Behavioral:  Negative for agitation and confusion.   All other systems reviewed and are negative.   Updated Vital Signs BP 132/74   Pulse 99   Temp 98 F (36.7 C)   Resp 16   SpO2 98%   Physical Exam Vitals and nursing note reviewed.  Constitutional:      General: He is not in acute distress.    Appearance: He is well-developed. He is not ill-appearing, toxic-appearing or diaphoretic.  HENT:     Head: Normocephalic and atraumatic.  Eyes:     Conjunctiva/sclera: Conjunctivae normal.     Pupils: Pupils are equal, round, and reactive to light.  Cardiovascular:     Rate and Rhythm: Normal rate and regular rhythm.     Heart sounds: Normal heart sounds. No murmur heard. Pulmonary:     Effort: Pulmonary effort is normal. No respiratory distress.     Breath sounds: Normal breath sounds. No decreased breath sounds, wheezing, rhonchi or rales.  Abdominal:     Palpations: Abdomen is soft.     Tenderness: There is no abdominal tenderness.  Musculoskeletal:        General: No swelling.     Cervical back: Neck supple.     Right lower leg: No edema.     Left lower leg: No edema.  Skin:    General: Skin is warm and dry.     Capillary Refill: Capillary refill takes less than 2 seconds.     Findings: No erythema.  Neurological:     General: No focal deficit present.     Mental Status: He is alert.  Psychiatric:        Mood and Affect: Mood normal.     (all labs ordered are listed, but only abnormal results are displayed) Labs Reviewed  BASIC METABOLIC PANEL WITH GFR - Abnormal; Notable for the following components:      Result Value   Glucose, Bld 147 (*)    All other components within normal limits  CBC  TROPONIN T, HIGH SENSITIVITY  TROPONIN T, HIGH SENSITIVITY    EKG: EKG  Interpretation Date/Time:  Tuesday June 17 2024 15:14:04 EST Ventricular Rate:  89 PR Interval:  166 QRS Duration:  154 QT Interval:  414 QTC Calculation: 503 R Axis:   60  Text Interpretation: Normal sinus rhythm Right bundle branch block Abnormal ECG When compared with ECG of 07-Sep-2023 09:32, PREVIOUS ECG IS PRESENT Confirmed by Laurice Coy [phone]) on 06/17/2024 4:12:35 PM  Radiology: [facility] Chest 2 View Result Date: 06/17/2024 CLINICAL DATA:  Chest pain. EXAM: CHEST - 2 VIEW COMPARISON:  11/06/2022 FINDINGS: The heart size and mediastinal contours are within normal limits. There is no evidence of pulmonary edema, consolidation, pneumothorax, nodule or pleural fluid. The visualized skeletal structures are unremarkable. IMPRESSION: No active cardiopulmonary disease. Electronically Signed   By:  Marcey Moan M.D.   On: 06/17/2024 16:11     Procedures   Medications Ordered in the ED - No data to display                                  Medical Decision Making Amount and/or Complexity of Data Reviewed Labs: ordered. Radiology: ordered.    Donald [name] is a 54 y.o. male with a past medical history significant for hypertension, hyperlipidemia, paroxysmal atrial fibrillation on Eliquis  therapy, GERD, and previous atypical chest pain with previous reassuring heart catheterization last year who presents with chest discomfort.  According to patient, he started having chest pain around noon today and has been having coughing for the last week or so.  He denies any productive cough and reports the cough is actually improved today.  He reports no significant shortness of breath, nausea, vomiting, constipation, diarrhea, or urinary changes.  He reported he did have some sweatiness but he says it was not different than normal.  He denies any leg pain or leg swelling.  Denies any trauma.  Denies rashes to suggest shingles.  He thinks it could be musculoskeletal from his recent coughing.   He did not eat anything to provoke it either.  No abdominal pain flank pain or back pain.  On exam, lungs clear.  Chest is nontender and I cannot reproduce the discomfort.  No murmur.  Abdomen nontender.  Good pulses in extremities.  Legs nontender and nonedematous.  Patient resting comfortably with reassuring vital signs initially.  He is not tachycardic, tachypneic, or hypotensive.  He is afebrile.  Patient had some workup starting in triage.  His EKG appears similar to prior with no STEMI.  His CBC is unremarkable and his BMP does not show critical abnormality initially.  Initial troponin is undetectable and his second troponin was also negative at less than 15.  His chest x-ray showed no acute cardiopulmonary disease with his recent coughing.  Anticipate reassessment to determine if he needs further workup at this time.  Patient reassessed and he is feeling well.  He agrees with following up with his PCP and cardiology and we did offer to consult cardiology but he would rather go home.  X-ray reassuring and labs are all reassuring.  He is feeling better now.  He will take prescription for Lidoderm  patch and muscle relaxant given his cough and likely muscle spasm.  He will follow-up with his primary team.  He understood return precautions and follow-up instructions and was discharged in good condition with well appearance      Final diagnoses:  Atypical chest pain  Cough, unspecified type    Clinical Impression: 1. Atypical chest pain   2. Cough, unspecified type     Disposition: Discharge  Condition: Good  I have discussed the results, Dx and Tx plan with the pt(& family if present). He/she/they expressed understanding and agree(s) with the plan. Discharge instructions discussed at great length. Strict return precautions discussed and pt &/or family have verbalized understanding of the instructions. No further questions at time of discharge.    Discharge Medication List as of  06/17/2024  8:49 PM     START taking these medications   Details  !! cyclobenzaprine  (FLEXERIL ) 10 MG tablet Take 1 tablet (10 mg total) by mouth 2 (two) times daily as needed for muscle spasms., Starting Tue 06/17/2024, Print    lidocaine  (LIDODERM ) 5 % Place 1  patch onto the skin daily. Remove & Discard patch within 12 hours or as directed by MD, Starting Tue 06/17/2024, Print     !! - Potential duplicate medications found. Please discuss with provider.      Follow Up: Levora Reyes SAUNDERS, MD [address] [place] [address] [phone]     your cardiology team          Cerita Rabelo, Lonni PARAS, MD 06/17/24 2109  "

## 2024-06-18 ENCOUNTER — Other Ambulatory Visit: Payer: Self-pay | Admitting: Family Medicine

## 2024-06-18 DIAGNOSIS — I10 Essential (primary) hypertension: Secondary | ICD-10-CM

## 2024-07-01 NOTE — Progress Notes (Unsigned)
 CARDIOLOGY CONSULT NOTE       Patient ID: Donald [name] MRN: [id number] DOB/AGE: 56-09-1970 56 y.o.  Primary Physician: Levora Reyes SAUNDERS, MD Primary Cardiologist: Delford   HPI:  54 y.o. first seen January 2021 for chest pain.  History of GERD, HLD, HTN and RBBB. Pain atypical. 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 Converted with iv cardizem  and d/c with Cardizem  CD and Eliquis  CHAD VASC 2  Not tolerating cardizem  makes him feel foggy/tired Changed to verapamil  04/24/23   F/U 14 day Zio with short runds SVT per Dr Camnitz  TTE:  09/14/23 reviewed no bad valves normal EF LA 38 mm   Imdur  decreased for some postural symptoms   Seen in ED 06/17/24 chest discomfort Had cough for a week. R/O negative troponin x2 labs otherwise ok CXR NAD and no acute ECG changes     ROS All other systems reviewed and negative except as noted above  Past Medical History:  Diagnosis Date   Blood transfusion without reported diagnosis    Chronic bilateral low back pain without sciatica 08/19/2020   DEPRESSION, MILD    Dysuria    G E R D    Hypercholesterolemia    Hyperlipidemia    NOT TAKING MEDICATION   Hypertension    Lumbago    NEPHROLITHIASIS, HX OF    PARASOMNIA    Paroxysmal atrial fibrillation (HCC) 07/10/2022   Prediabetes 08/19/2020   Right bundle branch block     Family History  Problem Relation Age of Onset   Hypertension Mother    Hypertension Sister     Social History   Socioeconomic History    Marital status: Married    Spouse name: Not on file   Number of children: 6   Years of education: 15   Highest education level: Not on file  Occupational History   Occupation: location manager    Employer: [employer]  Tobacco Use   Smoking status: Former    Types: Cigarettes   Smokeless tobacco: Never   Tobacco comments:    Former smoker 09/07/23  Vaping Use   Vaping status: Never Used  Substance and Sexual Activity   Alcohol use: No    Alcohol/week: 0.0 standard drinks of alcohol   Drug use: No   Sexual activity: Not Currently  Other Topics Concern   Not on file  Social History Narrative   ** Merged History Encounter **       Social Drivers of Health   Tobacco Use: Medium Risk (04/28/2024)   Patient History    Smoking Tobacco Use: Former    Smokeless Tobacco Use: Never    Passive Exposure: Not on Actuary Strain: Not on file  Food Insecurity: No Food Insecurity (09/14/2023)   Hunger Vital Sign    Worried About Running Out of Food in the Last Year: Never true    Ran Out of Food in the Last Year: Never true  Transportation Needs: No Transportation Needs (09/14/2023)  PRAPARE - Administrator, Civil Service (Medical): No    Lack of Transportation (Non-Medical): No  Physical Activity: Not on file  Stress: Not on file  Social Connections: Unknown (10/18/2021)   Received from [facility]   Social Network    Social Network: Not on file  Intimate Partner Violence: Not At Risk (09/14/2023)   Humiliation, Afraid, Rape, and Kick questionnaire    Fear of Current or Ex-Partner: No    Emotionally Abused: No    Physically Abused: No    Sexually Abused: No  Depression (PHQ2-9): Low Risk (04/28/2024)   Depression (PHQ2-9)    PHQ-2 Score: 1  Alcohol Screen: Not on file  Housing: Low Risk (09/14/2023)   Housing Stability Vital Sign    Unable to Pay for Housing in the Last Year: No    Number of Times Moved in the  Last Year: 0    Homeless in the Last Year: No  Utilities: Not At Risk (09/14/2023)   AHC Utilities    Threatened with loss of utilities: No  Health Literacy: Not on file    Past Surgical History:  Procedure Laterality Date   CARDIAC CATHETERIZATION N/A 01/19/2016   Procedure: Left Heart Cath and Coronary Angiography;  Surgeon: Ozell Fell, MD;  Location: [facility] INVASIVE CV LAB;  Service: Cardiovascular;  Laterality: N/A;   HAND SURGERY     LEFT HEART CATH AND CORONARY ANGIOGRAPHY N/A 09/13/2023   Procedure: LEFT HEART CATH AND CORONARY ANGIOGRAPHY;  Surgeon: Wendel Lurena POUR, MD;  Location: [facility] INVASIVE CV LAB;  Service: Cardiovascular;  Laterality: N/A;   right and left hand        Current Outpatient Medications:    acetaminophen  (TYLENOL ) 500 MG tablet, Take 500 mg by mouth daily as needed for moderate pain (pain score 4-6) or headache., Disp: , Rfl:    apixaban  (ELIQUIS ) 5 MG TABS tablet, Take 1 tablet (5 mg total) by mouth 2 (two) times daily., Disp: 180 tablet, Rfl: 1   b complex vitamins capsule, Take 1 capsule by mouth daily., Disp: , Rfl:    Cholecalciferol  (VITAMIN D -3 PO), Take 1 tablet by mouth daily., Disp: , Rfl:    Cyanocobalamin (VITAMIN B-12 PO), Take 1 tablet by mouth daily., Disp: , Rfl:    cyclobenzaprine  (FLEXERIL ) 10 MG tablet, Take 1 tablet (10 mg total) by mouth 2 (two) times daily as needed for muscle spasms., Disp: 20 tablet, Rfl: 0   cyclobenzaprine  (FLEXERIL ) 10 MG tablet, Take 1 tablet (10 mg total) by mouth 2 (two) times daily as needed for muscle spasms., Disp: 20 tablet, Rfl: 0   hydrochlorothiazide  (HYDRODIURIL ) 25 MG tablet, Take 1 tablet (25 mg total) by mouth daily., Disp: 90 tablet, Rfl: 1   isosorbide  mononitrate (IMDUR ) 30 MG 24 hr tablet, Take 0.5 tablets (15 mg total) by mouth daily. Can take an additional half tab as needed for chest pain (Patient taking differently: Take 30 mg by mouth daily as needed (chest pain).), Disp: 90 tablet, Rfl:  0   lidocaine  (LIDODERM ) 5 %, Place 1 patch onto the skin daily. Remove & Discard patch within 12 hours or as directed by MD, Disp: 15 patch, Rfl: 0   MAGNESIUM PO, Take 1 tablet by mouth daily., Disp: , Rfl:    Multiple Vitamins-Minerals (ZINC PO), Take 1 tablet by mouth daily., Disp: , Rfl:    nitroGLYCERIN  (NITROSTAT ) 0.4 MG SL tablet, Place 1 tablet (0.4 mg total) under the tongue every 5 (five) minutes  as needed., Disp: 25 tablet, Rfl: 3   pantoprazole  (PROTONIX ) 40 MG tablet, Take 1 tablet (40 mg total) by mouth daily as needed (heartburn)., Disp: , Rfl:    POTASSIUM PO, Take 1 tablet by mouth daily. OTC, Disp: , Rfl:    rosuvastatin  (CRESTOR ) 20 MG tablet, Take 1 tablet (20 mg total) by mouth daily., Disp: 90 tablet, Rfl: 1   valsartan  (DIOVAN ) 160 MG tablet, TAKE 1 TABLET BY MOUTH EVERY DAY, Disp: 90 tablet, Rfl: 1   verapamil  (CALAN ) 40 MG tablet, TAKE 1 TABLET (40 MG TOTAL) BY MOUTH DAILY AS NEEDED (BREAKTHROUGH CHEST PAIN). (Patient not taking: Reported on 04/28/2024), Disp: 90 tablet, Rfl: 3   verapamil  (CALAN -SR) 120 MG CR tablet, Take 1 tablet (120 mg total) by mouth at bedtime., Disp: 90 tablet, Rfl: 3    Physical Exam: There were no vitals taken for this visit.    Affect appropriate Healthy:  appears stated age HEENT: normal Neck supple with no adenopathy JVP normal no bruits no thyromegaly Lungs clear with no wheezing and good diaphragmatic motion Heart:  S1/S2 no murmur, no rub, gallop or click PMI normal Abdomen: benighn, BS positve, no tenderness, no AAA no bruit.  No HSM or HJR Distal pulses intact with no bruits No edema Neuro non-focal Skin warm and dry No muscular weakness    Labs:   Lab Results  Component Value Date   WBC 6.1 06/17/2024   HGB 13.3 06/17/2024   HCT 40.2 06/17/2024   MCV 85.5 06/17/2024   PLT 336 06/17/2024    No results for input(s): NA, K, CL, CO2, BUN, CREATININE, CALCIUM , PROT, BILITOT, ALKPHOS,  ALT, AST, GLUCOSE in the last 168 hours.  Invalid input(s): LABALBU  Lab Results  Component Value Date   CKTOTAL 191 06/18/2019   CKMB 3.6 01/19/2016   TROPONINI <0.03 09/23/2018    Lab Results  Component Value Date   CHOL 206 (H) 04/28/2024   CHOL 134 11/26/2023   CHOL 194 09/13/2023   Lab Results  Component Value Date   HDL 49.60 04/28/2024   HDL 50.50 11/26/2023   HDL 45 09/13/2023   Lab Results  Component Value Date   LDLCALC 142 (H) 04/28/2024   LDLCALC 73 11/26/2023   LDLCALC 139 (H) 09/13/2023   Lab Results  Component Value Date   TRIG 72.0 04/28/2024   TRIG 51.0 11/26/2023   TRIG 48 09/13/2023   Lab Results  Component Value Date   CHOLHDL 4 04/28/2024   CHOLHDL 3 11/26/2023   CHOLHDL 4.3 09/13/2023   No results found for: LDLDIRECT    Radiology: DG Chest 2 View Result Date: 06/17/2024 CLINICAL DATA:  Chest pain. EXAM: CHEST - 2 VIEW COMPARISON:  11/06/2022 FINDINGS: The heart size and mediastinal contours are within normal limits. There is no evidence of pulmonary edema, consolidation, pneumothorax, nodule or pleural fluid. The visualized skeletal structures are unremarkable. IMPRESSION: No active cardiopulmonary disease. Electronically Signed   By: Marcey Moan M.D.   On: 06/17/2024 16:11    EKG: NSR rate 83 normal 06/29/19   ASSESSMENT AND PLAN:    1. Chest Pain:  NM PET CT normal 03/2023 EF 67% normal perfusion and MBPR 2.76 Normal cath 09/13/23 Recent ER visit R/O atypical pain from coughing ? URI Observe  2. HTN:  Well controlled continue low sodium DASH diet and current meds  3. HLD:  On statin labs with primary  4. GI:  Abdominal pain RUQ US  normal 09/02/20   MRI  abdomen with hemorrhagic cyst improved  6. PAF:  06/03/22 CHADVASC 2 Cardizem  not tolerated on verapamil  continue eliquis  TTE 07/04/22 EF 60-65% mild LAE aorta 3.8 cm     F/U cardiology in a year   Signed: Maude Emmer 07/01/2024, 11:56 AM

## 2024-07-02 ENCOUNTER — Ambulatory Visit: Admitting: Emergency Medicine

## 2024-07-04 ENCOUNTER — Ambulatory Visit: Admitting: Cardiovascular Disease

## 2024-09-26 ENCOUNTER — Ambulatory Visit: Admitting: Cardiovascular Disease

## 2024-11-03 ENCOUNTER — Ambulatory Visit: Admitting: Family Medicine
